# Patient Record
Sex: Male | Born: 1935 | Race: White | Hispanic: No | Marital: Married | Smoking: Former smoker
Health system: Southern US, Community
[De-identification: ages and names within clinical notes are randomized; demographics above are authoritative.]

## PROBLEM LIST (undated history)

## (undated) DIAGNOSIS — L719 Rosacea, unspecified: Secondary | ICD-10-CM

## (undated) DIAGNOSIS — I10 Essential (primary) hypertension: Secondary | ICD-10-CM

## (undated) DIAGNOSIS — R569 Unspecified convulsions: Secondary | ICD-10-CM

## (undated) DIAGNOSIS — Z86010 Personal history of colonic polyps: Secondary | ICD-10-CM

## (undated) DIAGNOSIS — R32 Unspecified urinary incontinence: Secondary | ICD-10-CM

## (undated) DIAGNOSIS — I712 Thoracic aortic aneurysm, without rupture, unspecified: Secondary | ICD-10-CM

## (undated) DIAGNOSIS — Z8719 Personal history of other diseases of the digestive system: Secondary | ICD-10-CM

## (undated) DIAGNOSIS — R55 Syncope and collapse: Secondary | ICD-10-CM

## (undated) DIAGNOSIS — Z8249 Family history of ischemic heart disease and other diseases of the circulatory system: Secondary | ICD-10-CM

## (undated) DIAGNOSIS — K219 Gastro-esophageal reflux disease without esophagitis: Secondary | ICD-10-CM

## (undated) DIAGNOSIS — R112 Nausea with vomiting, unspecified: Secondary | ICD-10-CM

## (undated) DIAGNOSIS — J309 Allergic rhinitis, unspecified: Secondary | ICD-10-CM

## (undated) DIAGNOSIS — H6690 Otitis media, unspecified, unspecified ear: Secondary | ICD-10-CM

## (undated) DIAGNOSIS — E785 Hyperlipidemia, unspecified: Secondary | ICD-10-CM

## (undated) DIAGNOSIS — Z9889 Other specified postprocedural states: Secondary | ICD-10-CM

## (undated) HISTORY — DX: Rosacea, unspecified: L71.9

## (undated) HISTORY — DX: Family history of ischemic heart disease and other diseases of the circulatory system: Z82.49

## (undated) HISTORY — DX: Personal history of other diseases of the digestive system: Z87.19

## (undated) HISTORY — DX: Thoracic aortic aneurysm, without rupture: I71.2

## (undated) HISTORY — DX: Allergic rhinitis, unspecified: J30.9

## (undated) HISTORY — DX: Syncope and collapse: R55

## (undated) HISTORY — DX: Unspecified convulsions: R56.9

## (undated) HISTORY — PX: CATARACT EXTRACTION: SUR2

## (undated) HISTORY — DX: Unspecified urinary incontinence: R32

## (undated) HISTORY — DX: Hyperlipidemia, unspecified: E78.5

## (undated) HISTORY — DX: Essential (primary) hypertension: I10

## (undated) HISTORY — DX: Thoracic aortic aneurysm, without rupture, unspecified: I71.20

## (undated) HISTORY — DX: Personal history of colonic polyps: Z86.010

## (undated) HISTORY — DX: Otitis media, unspecified, unspecified ear: H66.90

## (undated) HISTORY — DX: Gastro-esophageal reflux disease without esophagitis: K21.9

---
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
## 1999-03-18 ENCOUNTER — Ambulatory Visit (HOSPITAL_COMMUNITY): Admission: RE | Admit: 1999-03-18 | Discharge: 1999-03-18 | Payer: Self-pay | Admitting: Gastroenterology

## 1999-05-10 HISTORY — PX: OTHER SURGICAL HISTORY: SHX169

## 2003-05-10 HISTORY — PX: OTHER SURGICAL HISTORY: SHX169

## 2003-05-10 LAB — HM COLONOSCOPY: HM Colonoscopy: NORMAL

## 2004-03-30 ENCOUNTER — Encounter: Admission: RE | Admit: 2004-03-30 | Discharge: 2004-03-30 | Payer: Self-pay | Admitting: Gastroenterology

## 2004-04-16 ENCOUNTER — Ambulatory Visit (HOSPITAL_COMMUNITY): Admission: RE | Admit: 2004-04-16 | Discharge: 2004-04-16 | Payer: Self-pay | Admitting: Gastroenterology

## 2004-04-16 ENCOUNTER — Encounter (INDEPENDENT_AMBULATORY_CARE_PROVIDER_SITE_OTHER): Payer: Self-pay | Admitting: *Deleted

## 2005-01-21 ENCOUNTER — Encounter: Admission: RE | Admit: 2005-01-21 | Discharge: 2005-01-31 | Payer: Self-pay | Admitting: Family Medicine

## 2006-05-09 HISTORY — PX: OTHER SURGICAL HISTORY: SHX169

## 2009-02-23 ENCOUNTER — Ambulatory Visit: Payer: Self-pay | Admitting: Internal Medicine

## 2009-02-23 DIAGNOSIS — R5383 Other fatigue: Secondary | ICD-10-CM

## 2009-02-23 DIAGNOSIS — Z8719 Personal history of other diseases of the digestive system: Secondary | ICD-10-CM

## 2009-02-23 DIAGNOSIS — I10 Essential (primary) hypertension: Secondary | ICD-10-CM | POA: Insufficient documentation

## 2009-02-23 DIAGNOSIS — Z860100 Personal history of colon polyps, unspecified: Secondary | ICD-10-CM | POA: Insufficient documentation

## 2009-02-23 DIAGNOSIS — R32 Unspecified urinary incontinence: Secondary | ICD-10-CM | POA: Insufficient documentation

## 2009-02-23 DIAGNOSIS — J309 Allergic rhinitis, unspecified: Secondary | ICD-10-CM

## 2009-02-23 DIAGNOSIS — K219 Gastro-esophageal reflux disease without esophagitis: Secondary | ICD-10-CM

## 2009-02-23 DIAGNOSIS — R5381 Other malaise: Secondary | ICD-10-CM

## 2009-02-23 HISTORY — DX: Personal history of colonic polyps: Z86.010

## 2009-02-23 HISTORY — DX: Gastro-esophageal reflux disease without esophagitis: K21.9

## 2009-02-23 HISTORY — DX: Allergic rhinitis, unspecified: J30.9

## 2009-02-23 HISTORY — DX: Essential (primary) hypertension: I10

## 2009-02-23 HISTORY — DX: Personal history of colon polyps, unspecified: Z86.0100

## 2009-02-23 HISTORY — DX: Unspecified urinary incontinence: R32

## 2009-02-23 HISTORY — DX: Personal history of other diseases of the digestive system: Z87.19

## 2009-02-23 LAB — CONVERTED CEMR LAB
ALT: 24 units/L (ref 0–53)
AST: 26 units/L (ref 0–37)
Albumin: 3.7 g/dL (ref 3.5–5.2)
Alkaline Phosphatase: 69 units/L (ref 39–117)
BUN: 16 mg/dL (ref 6–23)
Basophils Absolute: 0 10*3/uL (ref 0.0–0.1)
Basophils Relative: 0.6 % (ref 0.0–3.0)
Bilirubin Urine: NEGATIVE
Bilirubin, Direct: 0.1 mg/dL (ref 0.0–0.3)
CO2: 29 meq/L (ref 19–32)
Calcium: 8.4 mg/dL (ref 8.4–10.5)
Chloride: 99 meq/L (ref 96–112)
Cholesterol: 187 mg/dL (ref 0–200)
Creatinine, Ser: 1.2 mg/dL (ref 0.4–1.5)
Eosinophils Absolute: 0 10*3/uL (ref 0.0–0.7)
Eosinophils Relative: 0.7 % (ref 0.0–5.0)
GFR calc non Af Amer: 62.21 mL/min (ref >60)
Glucose, Bld: 95 mg/dL (ref 70–99)
HCT: 41.9 % (ref 39.0–52.0)
HDL: 42.1 mg/dL (ref >39.00)
Hemoglobin, Urine: NEGATIVE
Hemoglobin: 14.2 g/dL (ref 13.0–17.0)
Ketones, ur: NEGATIVE mg/dL
LDL Cholesterol: 120 mg/dL — ABNORMAL HIGH (ref 0–99)
Leukocytes, UA: NEGATIVE
Lymphocytes Relative: 32.7 % (ref 12.0–46.0)
Lymphs Abs: 1.7 10*3/uL (ref 0.7–4.0)
MCHC: 33.9 g/dL (ref 30.0–36.0)
MCV: 96.9 fL (ref 78.0–100.0)
Monocytes Absolute: 0.3 10*3/uL (ref 0.1–1.0)
Monocytes Relative: 6 % (ref 3.0–12.0)
Neutro Abs: 3.1 10*3/uL (ref 1.4–7.7)
Neutrophils Relative %: 60 % (ref 43.0–77.0)
Nitrite: NEGATIVE
Platelets: 230 10*3/uL (ref 150.0–400.0)
Potassium: 5 meq/L (ref 3.5–5.1)
RBC: 4.32 M/uL (ref 4.22–5.81)
RDW: 12.4 % (ref 11.5–14.6)
Sodium: 136 meq/L (ref 135–145)
Specific Gravity, Urine: <=1.005 (ref 1.000–1.030)
TSH: 1.64 microintl units/mL (ref 0.35–5.50)
Total Bilirubin: 0.8 mg/dL (ref 0.3–1.2)
Total CHOL/HDL Ratio: 4
Total Protein, Urine: NEGATIVE mg/dL
Total Protein: 7.8 g/dL (ref 6.0–8.3)
Triglycerides: 123 mg/dL (ref 0.0–149.0)
Urine Glucose: NEGATIVE mg/dL
Urobilinogen, UA: 0.2 (ref 0.0–1.0)
VLDL: 24.6 mg/dL (ref 0.0–40.0)
WBC: 5.1 10*3/uL (ref 4.5–10.5)
pH: 5.5 (ref 5.0–8.0)

## 2009-07-07 ENCOUNTER — Encounter: Payer: Self-pay | Admitting: Internal Medicine

## 2009-08-18 ENCOUNTER — Encounter: Payer: Self-pay | Admitting: Internal Medicine

## 2009-09-08 ENCOUNTER — Ambulatory Visit: Payer: Self-pay | Admitting: Internal Medicine

## 2009-09-09 DIAGNOSIS — L719 Rosacea, unspecified: Secondary | ICD-10-CM

## 2009-09-09 HISTORY — DX: Rosacea, unspecified: L71.9

## 2009-09-16 ENCOUNTER — Encounter: Payer: Self-pay | Admitting: Internal Medicine

## 2009-11-04 ENCOUNTER — Encounter: Payer: Self-pay | Admitting: Internal Medicine

## 2009-12-08 ENCOUNTER — Encounter: Payer: Self-pay | Admitting: Internal Medicine

## 2009-12-23 ENCOUNTER — Encounter: Payer: Self-pay | Admitting: Internal Medicine

## 2009-12-31 ENCOUNTER — Encounter: Payer: Self-pay | Admitting: Internal Medicine

## 2010-01-06 ENCOUNTER — Encounter: Payer: Self-pay | Admitting: Internal Medicine

## 2010-02-25 ENCOUNTER — Ambulatory Visit: Payer: Self-pay | Admitting: Internal Medicine

## 2010-06-08 NOTE — Letter (Signed)
Summary: [facility]   Imported By: Sherian Rein 01/12/2010 10:53:53  _____________________________________________________________________  External Attachment:    Type:   Image     Comment:   External Document

## 2010-06-08 NOTE — Letter (Signed)
Summary: [facility]  [facility]   Imported By: Lennie Odor 09/22/2009 16:32:22  _____________________________________________________________________  External Attachment:    Type:   Image     Comment:   External Document

## 2010-06-08 NOTE — Letter (Signed)
Summary: [facility] [place]   Imported By: Sherian Rein 07/10/2009 07:52:33  _____________________________________________________________________  External Attachment:    Type:   Image     Comment:   External Document

## 2010-06-08 NOTE — Letter (Signed)
Summary: [facility] [place]   Imported By: Sherian Rein 08/25/2009 09:50:12  _____________________________________________________________________  External Attachment:    Type:   Image     Comment:   External Document

## 2010-06-08 NOTE — Assessment & Plan Note (Signed)
Summary: new / ok'd dr Zachary Padilla / medicare  / cd   Primary Care Provider:  Newt Lukes MD   History of Present Illness: Patient presents today for a general physical exam. He has no problem with Dr. Felicity Padilla but he is "old school" and prefers a male to do his exam. He did have to change to Jud due to an insurance change. He had been a patient of Dr. Abigail Padilla.   He is having a problem with a flare of rosacea and rhinophyma.  He has hiatal hernia and significant reflux. He is concerned about the side effects of PPI meds. He is taking TUMS and being careful about his diet.   He has chronic sinus drainage.He has had sinus surgery by Dr. Dorma Padilla. He does have increased mucoid secretions. He does do a sinus wash daily.   Allergies: 1)  ! Sulfa 2)  ! Erythromycin 3)  ! Levaquin 4)  ! Penicillin  Past History:  Past Medical History: Last updated: 02/23/2009 Allergic rhinitis Colonic polyps, hx of Diverticulitis, hx of GERD Hypertension Urinary incontinence hx campylobacter infx (GI)  MD rooster: ENT-[facility] cards- Allyson Sabal [facility] skinJorja Loa  Past Surgical History: Last updated: 02/23/2009 Appendectomy (1946) Sinus surgery (2008) Removal of pre-cancer bump on neck (2005) left ear tube (2001) Ruptured append 1946  Family History: Last updated: 02/23/2009 Heart disease (parent) Stroke (parent)  Risk Factors: Alcohol Use: 0 (02/23/2009)  Risk Factors: Smoking Status: never (02/23/2009)  Social History: [employer]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  Review of Systems       The patient complains of vision loss, decreased  hearing, hoarseness, and headaches.  The patient denies anorexia, fever, weight loss, weight gain, chest pain, dyspnea on exertion, prolonged cough, hemoptysis, abdominal pain, severe indigestion/heartburn, muscle weakness, suspicious skin lesions, difficulty walking, unusual weight change, abnormal bleeding, and angioedema.         known to have cataracts. Opthalmic migraine. Has had vertigo when [place]. Nocturia x 2-3  Physical Exam  General:  alert, well-developed, well-nourished, and well-hydrated Zachary Padilla male in no distress.   Head:  normocephalic, atraumatic, and no abnormalities observed.   Eyes:  vision grossly intact, pupils equal, pupils round, corneas and lenses clear, and no injection.   Ears:  hearing aids in place Nose:  rhinophyma,  Mouth:  good dentition, no gingival abnormalities, and no dental plaque.   Neck:  supple, full ROM, no masses, no thyromegaly, and no carotid bruits.   Chest Wall:  no deformities and no tenderness.   Lungs:  normal respiratory effort, normal breath sounds, no crackles, and no wheezes.   Heart:  normal rate, regular rhythm, no murmur, no JVD, and no HJR.   Abdomen:  soft, non-tender, normal bowel sounds, no masses, no guarding, no abdominal hernia, and no hepatomegaly.   Rectal:  No external abnormalities noted. Normal sphincter tone. No rectal masses or tenderness. Genitalia:  circumcised, no hydrocele, no varicocele, and no scrotal masses.   Prostate:  no gland enlargement, no nodules, and no asymmetry.   Msk:  normal ROM, no joint tenderness, no joint swelling, no joint warmth, no joint instability, and no  crepitation.   Pulses:  2+ radial pulses or 1+ DP Extremities:  No clubbing, cyanosis, edema, or deformity noted with normal full range of motion of all joints.   Neurologic:  alert & oriented X3, cranial nerves II-XII intact, strength normal in all extremities, gait normal, and DTRs symmetrical and normal.   Skin:  turgor normal  and color normal.  wide spread seborrheic keratosis.   Impression & Recommendations:  Problem # 1:  HYPERTENSION (ICD-401.9)  His updated medication list for this problem includes:    Cozaar 50 Mg Tabs (Losartan potassium) .Marland Kitchen... Take 1 by mouth qd    Doxazosin Mesylate 1 Mg Tabs (Doxazosin mesylate) .Marland Kitchen... Take 1/2 by mouth qhs  Prior BP: 110/60 (02/23/2009)  Labs Reviewed: K+: 5.0 (02/23/2009) Creat: : 1.2 (02/23/2009)   Chol: 187 (02/23/2009)   HDL: 42.10 (02/23/2009)   LDL: 120 (02/23/2009)   TG: 123.0 (02/23/2009)  Good control on his present medications - will continue the same  Problem # 2:  GERD (ICD-530.81) Patient with long-standing GERD. He is concerned about long term use of PPI therapy and has tried to wean off the omeprazole.  Plan - Consider the use of H2 blocker therapy,e.g. ranitidine 150 two times a day.           Keep follow-up with Dr. Christella Hartigan  His updated medication list for this problem includes:    Nexium 40 Mg Cpdr (Esomeprazole magnesium) ..... Use prn    Omeprazole 20 Mg Cpdr (Omeprazole) .Marland Kitchen... 1 by mouth once daily or every other day for heartburn symptoms  Problem # 3:  ALLERGIC RHINITIS (ICD-477.9) He has chronic allergic rhinnitis and inspissated mucoid secretions. He sees Dr. Dorma Padilla on a regular basis for irrigation.  Plan - continue with Dr. Dorma Padilla           May want to consider the use of nasal inhalational steroid spray for long term management of rhinnitis.  Problem # 4:  ACNE ROSACEA (ICD-695.3) Patient with rhinophyma and currently a flare of rosacea.   Plan - continue with topical treatment.   Problem # 5:  End of Life Care Started a discussion about issues of cardiac resuscitation, mechanical ventilation and other heroic measures. He is provided with a packet on living wills/ end of life planning.   Complete Medication List: 1)  Cozaar 50 Mg Tabs (Losartan potassium) .... Take 1 by mouth qd 2)  Nexium 40 Mg Cpdr (Esomeprazole magnesium)  .... Use prn 3)  Finacea 15 % Gel (Azelaic acid) .... Use daily 4)  Omeprazole 20 Mg Cpdr (Omeprazole) .Marland Kitchen.. 1 by mouth once daily or every other day for heartburn symptoms 5)  Doxazosin Mesylate 1 Mg Tabs (Doxazosin mesylate) .... Take 1/2 by mouth qhs  Patient: Zachary Padilla Note: All result statuses are Final unless otherwise noted.  Tests: (1) BMP (METABOL)   Sodium                    136 mEq/L                   135-145   Potassium                 5.0 mEq/L                   3.5-5.1   Chloride                  99 mEq/L  96-112   Carbon Dioxide            29 mEq/L                    19-32   Glucose                   95 mg/dL                    19-62   BUN                       16 mg/dL                    2-29   Creatinine                1.2 mg/dL                   7.9-8.9   Calcium                   8.4 mg/dL                   2.1-19.4   GFR                       62.21 mL/min                >60  Tests: (2) CBC Platelet w/Diff (CBCD)   Lukas Cell Count          5.1 K/uL                    4.5-10.5   Red Cell Count            4.32 Mil/uL                 4.22-5.81   Hemoglobin                14.2 g/dL                   17.4-08.1   Hematocrit                41.9 %                      39.0-52.0   MCV                       96.9 fl                     78.0-100.0   MCHC                      33.9 g/dL                   44.8-18.5   RDW                       12.4 %                      11.5-14.6   Platelet Count            230.0 K/uL                  150.0-400.0   Neutrophil %  60.0 %                      43.0-77.0   Lymphocyte %              32.7 %                      12.0-46.0   Monocyte %                6.0 %                       3.0-12.0   Eosinophils%              0.7 %                       0.0-5.0   Basophils %               0.6 %                       0.0-3.0   Neutrophill Absolute      3.1 K/uL                    1.4-7.7   Lymphocyte Absolute        1.7 K/uL                    0.7-4.0   Monocyte Absolute         0.3 K/uL                    0.1-1.0  Eosinophils, Absolute                             0.0 K/uL                    0.0-0.7   Basophils Absolute        0.0 K/uL                    0.0-0.1  Tests: (3) Hepatic/Liver Function Panel (HEPATIC)   Total Bilirubin           0.8 mg/dL                   0.4-5.4   Direct Bilirubin          0.1 mg/dL                   0.9-8.1   Alkaline Phosphatase      69 U/L                      39-117   AST                       26 U/L                      0-37   ALT                       24 U/L                      0-53   Total Protein  7.8 g/dL                    1.6-1.0   Albumin                   3.7 g/dL                    9.6-0.4  Tests: (4) TSH (TSH)   FastTSH                   1.64 uIU/mL                 0.35-5.50  Tests: (5) UDip w/Micro (URINE)   Color                     LT. YELLOW       RANGE:  Yellow;Lt. Yellow   Clarity                   CLEAR                       Clear   Specific Gravity          <=1.005                     1.000 - 1.030   Urine Ph                  5.5                         5.0-8.0   Protein                   NEGATIVE                    Negative   Urine Glucose             NEGATIVE                    Negative   Ketones                   NEGATIVE                    Negative   Urine Bilirubin           NEGATIVE                    Negative   Blood                     NEGATIVE                    Negative   Urobilinogen              0.2                         0.0 - 1.0   Leukocyte Esterace        NEGATIVE                    Negative   Nitrite                   NEGATIVE                    Negative  Urine WBC                 0-2/hpf                     0-2/hpf   Urine Bacteria            Rare(<10/hpf)               None  Tests: (6) Lipid Panel (LIPID)   Cholesterol               187 mg/dL                   9-562     ATP III Classification             Desirable:  < 200 mg/dL                    Borderline High:  200 - 239 mg/dL               High:  > = 240 mg/dL   Triglycerides             123.0 mg/dL                 1.3-086.5     Normal:  <150 mg/dL     Borderline High:  784 - 199 mg/dL   HDL                       69.62 mg/dL                 >95.28   VLDL Cholesterol          24.6 mg/dL                  4.1-32.4   LDL Cholesterol      [H]  401 mg/dL                   0-27  CHO/HDL Ratio:  CHD Risk                             4                    Men          Women     1/2 Average Risk     3.4          3.3     Average Risk          5.0          4.4     2X Average Risk          9.6          7.1     3X Average Risk          15.0          11.0

## 2010-06-08 NOTE — Assessment & Plan Note (Signed)
Summary: FLU VAC  TLJ  STC  Nurse Visit   Allergies: 1)  ! Sulfa 2)  ! Erythromycin 3)  ! Levaquin 4)  ! Penicillin  Orders Added: 1)  Admin 1st Vaccine [90471] 2)  Flu Vaccine 66yrs + [25956] .lbflu1   Flu Vaccine Consent Questions     Do you have a history of severe allergic reactions to this vaccine? no    Any prior history of allergic reactions to egg and/or gelatin? no    Do you have a sensitivity to the preservative Thimersol? no    Do you have a past history of Guillan-Barre Syndrome? no    Do you currently have an acute febrile illness? no    Have you ever had a severe reaction to latex? no    Vaccine information given and explained to patient? yes    Are you currently pregnant? no    Lot Number:[id number]   Exp Date:11/06/2010   Site Given  Left Deltoid IM Lanier Prude, [facility])  February 25, 2010 3:21 PM

## 2010-06-08 NOTE — Letter (Signed)
Summary: [facility]  [facility]   Imported By: Lennie Odor 12/15/2009 12:10:32  _____________________________________________________________________  External Attachment:    Type:   Image     Comment:   External Document

## 2010-06-08 NOTE — Letter (Signed)
Summary: [facility]  [facility]   Imported By: Lester Dibble 01/26/2010 07:25:40  _____________________________________________________________________  External Attachment:    Type:   Image     Comment:   External Document

## 2010-06-08 NOTE — Letter (Signed)
Summary: [facility]   Imported By: Lester [place] 11/10/2009 09:16:19  _____________________________________________________________________  External Attachment:    Type:   Image     Comment:   External Document

## 2010-06-15 ENCOUNTER — Encounter: Payer: Self-pay | Admitting: Internal Medicine

## 2010-06-23 ENCOUNTER — Other Ambulatory Visit: Payer: Self-pay | Admitting: Dermatology

## 2010-06-23 DIAGNOSIS — C4491 Basal cell carcinoma of skin, unspecified: Secondary | ICD-10-CM

## 2010-06-23 HISTORY — DX: Basal cell carcinoma of skin, unspecified: C44.91

## 2010-07-05 ENCOUNTER — Other Ambulatory Visit: Payer: Self-pay | Admitting: Dermatology

## 2010-07-15 NOTE — Letter (Signed)
Summary: Runell Gess MD  Runell Gess MD   Imported By: Lester [place]itte 07/05/2010 08:36:12  _____________________________________________________________________  External Attachment:    Type:   Image     Comment:   External Document

## 2010-10-27 ENCOUNTER — Ambulatory Visit (INDEPENDENT_AMBULATORY_CARE_PROVIDER_SITE_OTHER): Payer: 59 | Admitting: Internal Medicine

## 2010-10-27 ENCOUNTER — Encounter: Payer: Self-pay | Admitting: Internal Medicine

## 2010-10-27 ENCOUNTER — Other Ambulatory Visit (INDEPENDENT_AMBULATORY_CARE_PROVIDER_SITE_OTHER): Payer: 59

## 2010-10-27 DIAGNOSIS — K219 Gastro-esophageal reflux disease without esophagitis: Secondary | ICD-10-CM

## 2010-10-27 DIAGNOSIS — M25512 Pain in left shoulder: Secondary | ICD-10-CM

## 2010-10-27 DIAGNOSIS — M25519 Pain in unspecified shoulder: Secondary | ICD-10-CM

## 2010-10-27 DIAGNOSIS — R1011 Right upper quadrant pain: Secondary | ICD-10-CM

## 2010-10-27 LAB — HEPATIC FUNCTION PANEL
ALT: 14 U/L (ref 0–53)
AST: 19 U/L (ref 0–37)
Alkaline Phosphatase: 81 U/L (ref 39–117)
Bilirubin, Direct: 0.1 mg/dL (ref 0.0–0.3)
Total Bilirubin: 0.7 mg/dL (ref 0.3–1.2)
Total Protein: 7.1 g/dL (ref 6.0–8.3)

## 2010-10-27 LAB — BASIC METABOLIC PANEL
BUN: 23 mg/dL (ref 6–23)
CO2: 30 mEq/L (ref 19–32)
Calcium: 8.6 mg/dL (ref 8.4–10.5)
Creatinine, Ser: 1.2 mg/dL (ref 0.4–1.5)
Glucose, Bld: 98 mg/dL (ref 70–99)
Sodium: 135 mEq/L (ref 135–145)

## 2010-10-27 LAB — CBC WITH DIFFERENTIAL/PLATELET
Basophils Absolute: 0 10*3/uL (ref 0.0–0.1)
Basophils Relative: 0.4 % (ref 0.0–3.0)
Eosinophils Absolute: 0.1 10*3/uL (ref 0.0–0.7)
HCT: 41.5 % (ref 39.0–52.0)
Hemoglobin: 14.6 g/dL (ref 13.0–17.0)
Lymphocytes Relative: 32.5 % (ref 12.0–46.0)
Lymphs Abs: 2.2 10*3/uL (ref 0.7–4.0)
MCV: 95.6 fl (ref 78.0–100.0)
Monocytes Relative: 6.7 % (ref 3.0–12.0)
Platelets: 182 10*3/uL (ref 150.0–400.0)
RBC: 4.34 Mil/uL (ref 4.22–5.81)
RDW: 12.8 % (ref 11.5–14.6)
WBC: 6.9 10*3/uL (ref 4.5–10.5)

## 2010-10-27 LAB — LIPASE: Lipase: 36 U/L (ref 11.0–59.0)

## 2010-10-27 NOTE — Progress Notes (Signed)
  Subjective:    Patient ID: Zachary Padilla, male    DOB: 1928-06-20, 77 y.o.   MRN: [id number]  HPI  Complains muscle spasm in left shoulder blade region Precipitated by reflux 4 days ago - 1st episode since stopping PPI summer 2011 Lasted few hours until resolution with TUMS Then awoke from sleep with pain under left shoulder pain -  Pain resolved with hot compress massage No recurrent pain in shoulder since or recurrent GERD No nausea and vomiting but concerned about GB problems Admits to overuse working in neighbors garden day prior to shoulder pain  Past Medical History  Diagnosis Date  . 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 ALLERGIC RHINITIS 02/23/2009  . COLONIC POLYPS, HX OF 02/23/2009  . DIVERTICULITIS, HX OF 02/23/2009  . GERD 02/23/2009  . HYPERTENSION 02/23/2009  . URINARY INCONTINENCE 02/23/2009    Review of Systems  Constitutional: Negative for fever.  Respiratory: Negative for shortness of breath.   Cardiovascular: Negative for leg swelling.  Gastrointestinal: Negative for nausea, vomiting, abdominal pain, diarrhea and constipation.       Objective:   Physical Exam BP 112/70  Pulse 72  Temp(Src) 97.7 F (36.5 C) (Oral)  Ht 5\' 10"  (1.778 m)  Wt 163 lb 6.4 oz (74.118 kg)  BMI 23.45 kg/m2  SpO2 96% Wt Readings from Last 3 Encounters:  10/27/10 163 lb 6.4 oz (74.118 kg)  02/23/09 166 lb 3.2 oz (75.388 kg)   Physical Exam  Constitutional:  oriented to person, place, and time. appears well-developed and well-nourished. No distress.  Neck: Normal range of motion. Neck supple. No JVD present. No thyromegaly present.  Cardiovascular: Normal rate, regular rhythm and normal heart sounds.  No murmur heard. no BLE edema Pulmonary/Chest: Effort normal and breath sounds normal. No respiratory distress. no wheezes.  Abdominal: Soft. Bowel sounds are normal. Patient exhibits no distension. There is no tenderness.  Musculoskeletal: Normal range of motion B shoulders and neck.  Patient exhibits no edema. no myofascial spasm B scapular region or neck. Neurological: he is alert and oriented to person, place, and time. No cranial nerve deficit. Coordination normal.  Skin: Skin is warm and dry.  No erythema or ulceration.  no shingles rash Psychiatric: he has a normal mood and affect. behavior is normal. Judgment and thought content normal.       Lab Results  Component Value Date   WBC 5.1 02/23/2009   HGB 14.2 02/23/2009   HCT 41.9 02/23/2009   PLT 230.0 02/23/2009   CHOL 187 02/23/2009   TRIG 123.0 02/23/2009   HDL 42.10 02/23/2009   ALT 24 02/23/2009   AST 26 02/23/2009   NA 136 02/23/2009   K 5.0 02/23/2009   CL 99 02/23/2009   CREATININE 1.2 02/23/2009   BUN 16 02/23/2009   CO2 29 02/23/2009   TSH 1.64 02/23/2009    Assessment & Plan:  GERD - transient, no symptoms at present - previously on PPI for same but self dc'd since summer 2011 - given transient pain radiation to shoulder blade, pt would like to be checked for GB labs - will check CBC and LFTs as well as lipase now - no change in meds as no symptoms present - cont prn TUMS if recurrent

## 2010-10-27 NOTE — Patient Instructions (Signed)
It was good to see you today. Exam appears benign today Test(s) ordered today. Your results will be called to you after review (48-72hours after test completion). If any changes need to be made, you will be notified at that time. Continue tums as needed, call if recurrent pain symptoms

## 2010-11-07 ENCOUNTER — Encounter: Payer: Self-pay | Admitting: Internal Medicine

## 2011-03-01 ENCOUNTER — Ambulatory Visit (INDEPENDENT_AMBULATORY_CARE_PROVIDER_SITE_OTHER): Payer: 59

## 2011-03-01 DIAGNOSIS — Z23 Encounter for immunization: Secondary | ICD-10-CM

## 2011-03-14 ENCOUNTER — Encounter (HOSPITAL_COMMUNITY): Payer: Self-pay

## 2011-03-16 ENCOUNTER — Other Ambulatory Visit: Payer: Self-pay | Admitting: Gastroenterology

## 2011-03-16 DIAGNOSIS — K5730 Diverticulosis of large intestine without perforation or abscess without bleeding: Secondary | ICD-10-CM

## 2011-03-17 ENCOUNTER — Ambulatory Visit (HOSPITAL_COMMUNITY): Admission: RE | Admit: 2011-03-17 | Payer: 59 | Source: Ambulatory Visit | Admitting: Gastroenterology

## 2011-03-17 ENCOUNTER — Encounter (HOSPITAL_COMMUNITY): Admission: RE | Payer: Self-pay | Source: Ambulatory Visit

## 2011-03-17 HISTORY — DX: Other specified postprocedural states: R11.2

## 2011-03-17 HISTORY — DX: Other specified postprocedural states: Z98.890

## 2011-03-17 SURGERY — COLONOSCOPY
Anesthesia: Monitor Anesthesia Care

## 2011-03-23 ENCOUNTER — Other Ambulatory Visit: Payer: 59

## 2011-03-29 ENCOUNTER — Ambulatory Visit
Admission: RE | Admit: 2011-03-29 | Discharge: 2011-03-29 | Disposition: A | Discharge status: Home / Self Care | Payer: 59 | Source: Ambulatory Visit | Attending: Gastroenterology | Admitting: Gastroenterology

## 2011-03-29 DIAGNOSIS — K5730 Diverticulosis of large intestine without perforation or abscess without bleeding: Secondary | ICD-10-CM

## 2011-03-30 ENCOUNTER — Other Ambulatory Visit: Payer: 59

## 2011-10-07 ENCOUNTER — Other Ambulatory Visit (INDEPENDENT_AMBULATORY_CARE_PROVIDER_SITE_OTHER): Payer: 59

## 2011-10-07 ENCOUNTER — Telehealth: Payer: Self-pay | Admitting: Internal Medicine

## 2011-10-07 DIAGNOSIS — R300 Dysuria: Secondary | ICD-10-CM

## 2011-10-07 DIAGNOSIS — R109 Unspecified abdominal pain: Secondary | ICD-10-CM

## 2011-10-07 LAB — URINALYSIS, ROUTINE W REFLEX MICROSCOPIC
Bilirubin Urine: NEGATIVE
Hgb urine dipstick: NEGATIVE
Nitrite: NEGATIVE
Specific Gravity, Urine: 1.02 (ref 1.000–1.030)
Total Protein, Urine: NEGATIVE
Urine Glucose: NEGATIVE
Urobilinogen, UA: 0.2 (ref 0.0–1.0)
pH: 6 (ref 5.0–8.0)

## 2011-10-07 NOTE — Telephone Encounter (Signed)
PT IS AWARE AND WILL GO TO THE LAB WITHIN THE HOUR.  PLEASE CALL HIM WHEN RESULTS COME IN.

## 2011-10-07 NOTE — Telephone Encounter (Signed)
Unable to work in - but pt can come in for UA (t lab) to check for UTI -

## 2011-10-07 NOTE — Telephone Encounter (Signed)
UA entered in epic... 10/07/11@1 :30pm/LMB

## 2011-10-07 NOTE — Telephone Encounter (Signed)
Pt may have a bladder infection.  Pain in left lower abd.  Would like to be worked in.

## 2011-10-10 ENCOUNTER — Telehealth: Payer: Self-pay | Admitting: *Deleted

## 2011-10-10 NOTE — Telephone Encounter (Signed)
Patient notified of negative UA results. To call if any more problems

## 2012-01-02 ENCOUNTER — Encounter: Payer: Self-pay | Admitting: Internal Medicine

## 2012-01-02 ENCOUNTER — Ambulatory Visit (INDEPENDENT_AMBULATORY_CARE_PROVIDER_SITE_OTHER): Payer: 59 | Admitting: Internal Medicine

## 2012-01-02 ENCOUNTER — Other Ambulatory Visit (INDEPENDENT_AMBULATORY_CARE_PROVIDER_SITE_OTHER): Payer: 59

## 2012-01-02 ENCOUNTER — Ambulatory Visit: Payer: 59 | Admitting: Internal Medicine

## 2012-01-02 ENCOUNTER — Telehealth: Payer: Self-pay | Admitting: Internal Medicine

## 2012-01-02 ENCOUNTER — Ambulatory Visit (INDEPENDENT_AMBULATORY_CARE_PROVIDER_SITE_OTHER)
Admission: RE | Admit: 2012-01-02 | Discharge: 2012-01-02 | Disposition: A | Discharge status: Home / Self Care | Payer: 59 | Source: Ambulatory Visit | Attending: Internal Medicine | Admitting: Internal Medicine

## 2012-01-02 VITALS — BP 126/70 | HR 71 | Temp 98.5°F | Resp 16 | Wt 168.0 lb

## 2012-01-02 DIAGNOSIS — I10 Essential (primary) hypertension: Secondary | ICD-10-CM

## 2012-01-02 DIAGNOSIS — R079 Chest pain, unspecified: Secondary | ICD-10-CM

## 2012-01-02 DIAGNOSIS — R059 Cough, unspecified: Secondary | ICD-10-CM

## 2012-01-02 DIAGNOSIS — R05 Cough: Secondary | ICD-10-CM

## 2012-01-02 DIAGNOSIS — R9431 Abnormal electrocardiogram [ECG] [EKG]: Secondary | ICD-10-CM

## 2012-01-02 LAB — CBC WITH DIFFERENTIAL/PLATELET
Basophils Absolute: 0.1 10*3/uL (ref 0.0–0.1)
Eosinophils Absolute: 0.1 10*3/uL (ref 0.0–0.7)
Hemoglobin: 14.2 g/dL (ref 13.0–17.0)
Lymphocytes Relative: 22.2 % (ref 12.0–46.0)
Lymphs Abs: 1.7 10*3/uL (ref 0.7–4.0)
MCHC: 32.8 g/dL (ref 30.0–36.0)
MCV: 97.6 fl (ref 78.0–100.0)
Monocytes Absolute: 0.5 10*3/uL (ref 0.1–1.0)
Monocytes Relative: 6.3 % (ref 3.0–12.0)
Neutro Abs: 5.4 10*3/uL (ref 1.4–7.7)
Neutrophils Relative %: 69.3 % (ref 43.0–77.0)
Platelets: 174 10*3/uL (ref 150.0–400.0)
RBC: 4.44 Mil/uL (ref 4.22–5.81)
RDW: 12.7 % (ref 11.5–14.6)

## 2012-01-02 LAB — TROPONIN I: Troponin I: <0.01 ng/mL (ref <0.06)

## 2012-01-02 NOTE — Assessment & Plan Note (Signed)
CXR today to look for pna, mass, edema 

## 2012-01-02 NOTE — Assessment & Plan Note (Signed)
His EKG today shows old changes in the inferior leads with Q waves and low voltage. There are no old EKG's for comparison. I have asked him to f/up with Dr.Berry. I will check his labs today to look for ischemia and other issues like abnormal lyytes that can cause an abnormal EKG.

## 2012-01-02 NOTE — Progress Notes (Signed)
Subjective:    Patient ID: Zachary Padilla, male    DOB: July 23, 1928, 78 y.o.   MRN: [id number]  Cough This is a new problem. The current episode started in the past 7 days. The problem has been unchanged. The cough is non-productive. Associated symptoms include chest pain, nasal congestion, postnasal drip and rhinorrhea. Pertinent negatives include no chills, ear congestion, ear pain, fever, headaches, heartburn, hemoptysis, myalgias, rash, sore throat, shortness of breath, sweats, weight loss or wheezing. Nothing aggravates the symptoms. He has tried nothing for the symptoms.  Chest Pain  This is a new problem. The current episode started in the past 7 days. The onset quality is gradual. The problem occurs intermittently. The problem has been unchanged. The pain is present in the lateral region (left lower anterior rib cage). The pain is at a severity of 1/10. The pain is mild. Quality: "pulling and slipping" The pain does not radiate. Associated symptoms include a cough. Pertinent negatives include no abdominal pain, back pain, claudication, diaphoresis, dizziness, exertional chest pressure, fever, headaches, hemoptysis, irregular heartbeat, leg pain, lower extremity edema, malaise/fatigue, nausea, near-syncope, numbness, orthopnea, palpitations, PND, shortness of breath, sputum production, syncope, vomiting or weakness. He has tried nothing for the symptoms.  Pertinent negatives for past medical history include no seizures.      Review of Systems  Constitutional: Negative for fever, chills, weight loss, malaise/fatigue, diaphoresis, activity change, appetite change, fatigue and unexpected weight change.  HENT: Positive for rhinorrhea and postnasal drip. Negative for ear pain, sore throat, trouble swallowing, dental problem, voice change and sinus pressure.   Eyes: Negative.   Respiratory: Positive for cough. Negative for apnea, hemoptysis, sputum production, choking, chest tightness, shortness of  breath, wheezing and stridor.   Cardiovascular: Positive for chest pain. Negative for palpitations, orthopnea, claudication, leg swelling, syncope, PND and near-syncope.  Gastrointestinal: Negative for heartburn, nausea, vomiting, abdominal pain, diarrhea, constipation and blood in stool.  Genitourinary: Negative.   Musculoskeletal: Negative for myalgias, back pain, joint swelling, arthralgias and gait problem.  Skin: Negative for color change, pallor, rash and wound.  Neurological: Negative for dizziness, tremors, seizures, syncope, facial asymmetry, speech difficulty, weakness, light-headedness, numbness and headaches.  Hematological: Negative for adenopathy. Does not bruise/bleed easily.  Psychiatric/Behavioral: Negative.        Objective:   Physical Exam  Vitals reviewed. Constitutional: He is oriented to person, place, and time. He appears well-developed and well-nourished.  Non-toxic appearance. He does not have a sickly appearance. He does not appear ill. No distress.  HENT:  Head: Normocephalic and atraumatic.  Mouth/Throat: Oropharynx is clear and moist. No oropharyngeal exudate.  Eyes: Conjunctivae are normal. Right eye exhibits no discharge. Left eye exhibits no discharge. No scleral icterus.  Neck: Normal range of motion. Neck supple. No JVD present. No tracheal deviation present. No thyromegaly present.  Cardiovascular: Normal rate, regular rhythm, normal heart sounds and intact distal pulses.  Exam reveals no gallop and no friction rub.   No murmur heard. Pulmonary/Chest: Effort normal and breath sounds normal. No stridor. No respiratory distress. He has no wheezes. He has no rales. Chest wall is not dull to percussion. He exhibits no mass, no tenderness, no bony tenderness, no laceration, no crepitus, no edema, no deformity, no swelling and no retraction. Right breast exhibits no inverted nipple, no mass, no nipple discharge, no skin change and no tenderness. Left breast  exhibits no inverted nipple, no mass, no nipple discharge, no skin change and no tenderness. Breasts are symmetrical.  Abdominal:  Soft. Bowel sounds are normal. He exhibits no distension and no mass. There is no tenderness. There is no rebound and no guarding.  Musculoskeletal: Normal range of motion. He exhibits no edema and no tenderness.  Lymphadenopathy:    He has no cervical adenopathy.  Neurological: He is oriented to person, place, and time.  Skin: Skin is warm and dry. No rash noted. He is not diaphoretic. No erythema. No pallor.  Psychiatric: He has a normal mood and affect. His behavior is normal. Judgment and thought content normal.      Lab Results  Component Value Date   WBC 6.9 10/27/2010   HGB 14.6 10/27/2010   HCT 41.5 10/27/2010   PLT 182.0 10/27/2010   GLUCOSE 98 10/27/2010   CHOL 187 02/23/2009   TRIG 123.0 02/23/2009   HDL 42.10 02/23/2009   LDLCALC 120* 02/23/2009   ALT 14 10/27/2010   AST 19 10/27/2010   NA 135 10/27/2010   K 4.9 10/27/2010   CL 100 10/27/2010   CREATININE 1.2 10/27/2010   BUN 23 10/27/2010   CO2 30 10/27/2010   TSH 1.64 02/23/2009      Assessment & Plan:

## 2012-01-02 NOTE — Telephone Encounter (Signed)
Pt wanted to be seen earlier in the day so he requested to be seen by another doctor....scheduled with Dr. Yetta Barre @ 3:30

## 2012-01-02 NOTE — Telephone Encounter (Signed)
Problem in left rib cage.  Feels like something is slipping.  Wants to be worked in.  No pain. Going on for 3 days.

## 2012-01-02 NOTE — Telephone Encounter (Signed)
Late, late today or tomorrow

## 2012-01-02 NOTE — Patient Instructions (Signed)
Chest Pain (Nonspecific) It is often hard to give a specific diagnosis for the cause of chest pain. There is always a chance that your pain could be related to something serious, such as a heart attack or a blood clot in the lungs. You need to follow up with your caregiver for further evaluation. CAUSES   Heartburn.   Pneumonia or bronchitis.   Anxiety or stress.   Inflammation around your heart (pericarditis) or lung (pleuritis or pleurisy).   A blood clot in the lung.   A collapsed lung (pneumothorax). It can develop suddenly on its own (spontaneous pneumothorax) or from injury (trauma) to the chest.   Shingles infection (herpes zoster virus).  The chest wall is composed of bones, muscles, and cartilage. Any of these can be the source of the pain.  The bones can be bruised by injury.   The muscles or cartilage can be strained by coughing or overwork.   The cartilage can be affected by inflammation and become sore (costochondritis).  DIAGNOSIS  Lab tests or other studies, such as X-rays, electrocardiography, stress testing, or cardiac imaging, may be needed to find the cause of your pain.  TREATMENT   Treatment depends on what may be causing your chest pain. Treatment may include:   Acid blockers for heartburn.   Anti-inflammatory medicine.   Pain medicine for inflammatory conditions.   Antibiotics if an infection is present.   You may be advised to change lifestyle habits. This includes stopping smoking and avoiding alcohol, caffeine, and chocolate.   You may be advised to keep your head raised (elevated) when sleeping. This reduces the chance of acid going backward from your stomach into your esophagus.   Most of the time, nonspecific chest pain will improve within 2 to 3 days with rest and mild pain medicine.  HOME CARE INSTRUCTIONS   If antibiotics were prescribed, take your antibiotics as directed. Finish them even if you start to feel better.   For the next few  days, avoid physical activities that bring on chest pain. Continue physical activities as directed.   Do not smoke.   Avoid drinking alcohol.   Only take over-the-counter or prescription medicine for pain, discomfort, or fever as directed by your caregiver.   Follow your caregiver's suggestions for further testing if your chest pain does not go away.   Keep any follow-up appointments you made. If you do not go to an appointment, you could develop lasting (chronic) problems with pain. If there is any problem keeping an appointment, you must call to reschedule.  SEEK MEDICAL CARE IF:   You think you are having problems from the medicine you are taking. Read your medicine instructions carefully.   Your chest pain does not go away, even after treatment.   You develop a rash with blisters on your chest.  SEEK IMMEDIATE MEDICAL CARE IF:   You have increased chest pain or pain that spreads to your arm, neck, jaw, back, or abdomen.   You develop shortness of breath, an increasing cough, or you are coughing up blood.   You have severe back or abdominal pain, feel nauseous, or vomit.   You develop severe weakness, fainting, or chills.   You have a fever.  THIS IS AN EMERGENCY. Do not wait to see if the pain will go away. Get medical help at once. Call your local emergency services (911 in [place] Do not drive yourself to the hospital. MAKE SURE YOU:   Understand these instructions.     Will watch your condition.   Will get help right away if you are not doing well or get worse.  Document Released: 02/02/2005 Document Revised: 04/14/2011 Document Reviewed: 11/29/2007 [employer] Patient Information 2012 [employer]. 

## 2012-01-02 NOTE — Assessment & Plan Note (Signed)
His BP is well controlled, I will check his lytes and renal function 

## 2012-01-02 NOTE — Assessment & Plan Note (Signed)
I will check his CXR to look for structural lesions and have ordered labs to look for chf and ischemia

## 2012-01-03 ENCOUNTER — Telehealth: Payer: Self-pay | Admitting: Internal Medicine

## 2012-01-03 LAB — COMPREHENSIVE METABOLIC PANEL
ALT: 19 U/L (ref 0–53)
AST: 24 U/L (ref 0–37)
Calcium: 8.5 mg/dL (ref 8.4–10.5)
Chloride: 101 mEq/L (ref 96–112)
Creatinine, Ser: 1.3 mg/dL (ref 0.4–1.5)
Glucose, Bld: 119 mg/dL — ABNORMAL HIGH (ref 70–99)
Potassium: 4.7 mEq/L (ref 3.5–5.1)
Sodium: 134 mEq/L — ABNORMAL LOW (ref 135–145)
Total Bilirubin: 0.8 mg/dL (ref 0.3–1.2)
Total Protein: 7 g/dL (ref 6.0–8.3)

## 2012-01-03 LAB — TSH: TSH: 1.1 u[IU]/mL (ref 0.35–5.50)

## 2012-01-03 LAB — LIPID PANEL
Cholesterol: 166 mg/dL (ref 0–200)
HDL: 40.2 mg/dL (ref >39.00)
Total CHOL/HDL Ratio: 4
Triglycerides: 181 mg/dL — ABNORMAL HIGH (ref 0.0–149.0)
VLDL: 36.2 mg/dL (ref 0.0–40.0)

## 2012-01-03 LAB — BRAIN NATRIURETIC PEPTIDE: Pro B Natriuretic peptide (BNP): 57 pg/mL (ref 0.0–100.0)

## 2012-01-03 NOTE — Telephone Encounter (Signed)
Pt scheduled for an appointment with Dr. Debby Bud tomorrow since he is the Pt PCP.

## 2012-01-03 NOTE — Telephone Encounter (Signed)
Pt was notified that his CXR results were normal. Pt stated that he still felt as thought there was fluid and movement in his lungs.

## 2012-01-03 NOTE — Telephone Encounter (Signed)
He should come in for a recheck and some labs

## 2012-01-04 ENCOUNTER — Encounter: Payer: Self-pay | Admitting: Internal Medicine

## 2012-01-04 ENCOUNTER — Ambulatory Visit: Payer: 59 | Admitting: Internal Medicine

## 2012-01-04 LAB — CARDIAC PANEL
CK-MB: 2.6 ng/mL (ref 0.3–4.0)
Relative Index: 3.1 calc — ABNORMAL HIGH (ref 0.0–2.5)
Total CK: 85 U/L (ref 7–232)

## 2012-01-05 ENCOUNTER — Encounter: Payer: Self-pay | Admitting: Internal Medicine

## 2012-01-05 ENCOUNTER — Ambulatory Visit: Payer: 59 | Admitting: Internal Medicine

## 2012-01-12 ENCOUNTER — Ambulatory Visit (INDEPENDENT_AMBULATORY_CARE_PROVIDER_SITE_OTHER): Payer: 59 | Admitting: Internal Medicine

## 2012-01-12 ENCOUNTER — Encounter: Payer: Self-pay | Admitting: Internal Medicine

## 2012-01-12 VITALS — BP 122/68 | HR 70 | Temp 98.1°F | Resp 16 | Wt 165.0 lb

## 2012-01-12 DIAGNOSIS — K219 Gastro-esophageal reflux disease without esophagitis: Secondary | ICD-10-CM

## 2012-01-12 DIAGNOSIS — R9431 Abnormal electrocardiogram [ECG] [EKG]: Secondary | ICD-10-CM

## 2012-01-12 DIAGNOSIS — I10 Essential (primary) hypertension: Secondary | ICD-10-CM

## 2012-01-12 NOTE — Progress Notes (Signed)
  Subjective:    Patient ID: Zachary Padilla, male    DOB: [birth year], 78 y.o.   MRN: [id number]  HPI Mr. Eisler had been at [facility]. He came back with a persistent cough and bloated feeling in the abdomen, like a balloon in the stomach. Uncomfortable enough that he would have to hold his belly to control the discomfort. He saw Dr. Yetta Barre August 26th- note reviewed: question of bronchitis vs cardiac issues due to EKG with minor changes with T-wave inversion and question of old anterior injury. No old studies to compare and he had no prior history. Subsequently he went to [facility], saw [facility]. EKG was similar to previous study in March '13. He has been scheduled for a nuclear stress test for risk stratification.   For his abdominal pain he did well with Gas-ex and antacids. He saw Dr. Abigail Miyamoto who agreed on the GI diagnosis.  PMH, FamHx and SocHx reviewed for any changes and relevance. Current Outpatient Prescriptions on File Prior to Visit  Medication Sig Dispense Refill  . Azelaic Acid (FINACEA) 15 % cream Apply 1 application topically 2 (two) times daily as needed. For Roscacea      . losartan (COZAAR) 50 MG tablet Take 50 mg by mouth.           Review of Systems System review is negative for any constitutional, cardiac, pulmonary, GI or neuro symptoms or complaints other than as described in the HPI.     Objective:   Physical Exam Filed Vitals:   01/12/12 1418  BP: 122/68  Pulse: 70  Temp: 98.1 F (36.7 C)  Resp: 16   Gen'l WNWD [place] man in no distress HEENT - C&S clear Cor- RRR Pulm - normal respirations Abd - no distention or bloating.       Assessment & Plan:

## 2012-01-12 NOTE — Assessment & Plan Note (Signed)
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 He has been scheduled for nuc stress

## 2012-01-12 NOTE — Assessment & Plan Note (Signed)
Flare of abdominal bloating most likely related to dyspepsia/GERD  Plan - H2 blocker full dose bid x 2 week, qHS  X 2 weeks then prn.

## 2012-01-12 NOTE — Assessment & Plan Note (Signed)
BP Readings from Last 3 Encounters:  01/12/12 122/68  01/02/12 126/70  10/27/10 112/70   Good control on present medications.

## 2012-01-16 ENCOUNTER — Telehealth: Payer: Self-pay | Admitting: Internal Medicine

## 2012-01-16 NOTE — Telephone Encounter (Signed)
The pt called hoping to dispute his no-show charge.  He states he changed his appt with Dr.Norins (the one he was "no showed" for) to see Dr.Jones sooner.  There was a mis-communication about canceling his appointment with Dr.Norins.  Pt hoping to get a call back when/if fee is removed, thanks!

## 2012-01-19 NOTE — Telephone Encounter (Signed)
Done, pt notified.

## 2012-01-30 ENCOUNTER — Encounter: Payer: Self-pay | Admitting: Internal Medicine

## 2012-03-15 ENCOUNTER — Other Ambulatory Visit: Payer: Self-pay | Admitting: Family Medicine

## 2012-03-15 DIAGNOSIS — E222 Syndrome of inappropriate secretion of antidiuretic hormone: Secondary | ICD-10-CM

## 2012-03-19 ENCOUNTER — Other Ambulatory Visit: Payer: Self-pay | Admitting: Family Medicine

## 2012-03-20 ENCOUNTER — Other Ambulatory Visit: Payer: 59

## 2012-03-21 ENCOUNTER — Inpatient Hospital Stay: Admission: RE | Admit: 2012-03-21 | Payer: 59 | Source: Ambulatory Visit

## 2012-03-22 ENCOUNTER — Ambulatory Visit
Admission: RE | Admit: 2012-03-22 | Discharge: 2012-03-22 | Disposition: A | Discharge status: Home / Self Care | Payer: Medicare Other | Source: Ambulatory Visit | Attending: Family Medicine | Admitting: Family Medicine

## 2012-03-22 ENCOUNTER — Other Ambulatory Visit: Payer: Self-pay | Admitting: Internal Medicine

## 2012-03-22 ENCOUNTER — Encounter: Payer: Self-pay | Admitting: Internal Medicine

## 2012-03-22 DIAGNOSIS — E222 Syndrome of inappropriate secretion of antidiuretic hormone: Secondary | ICD-10-CM

## 2012-03-22 DIAGNOSIS — R9389 Abnormal findings on diagnostic imaging of other specified body structures: Secondary | ICD-10-CM

## 2012-03-22 MED ORDER — IOHEXOL 300 MG/ML  SOLN
75.0000 mL | Freq: Once | INTRAMUSCULAR | Status: AC | PRN
  Administered 2012-03-22: 75 mL via INTRAVENOUS

## 2012-03-23 ENCOUNTER — Other Ambulatory Visit: Payer: 59

## 2012-03-26 ENCOUNTER — Ambulatory Visit
Admission: RE | Admit: 2012-03-26 | Discharge: 2012-03-26 | Disposition: A | Discharge status: Home / Self Care | Payer: Medicare Other | Source: Ambulatory Visit | Attending: Internal Medicine | Admitting: Internal Medicine

## 2012-03-26 DIAGNOSIS — R9389 Abnormal findings on diagnostic imaging of other specified body structures: Secondary | ICD-10-CM

## 2012-03-27 ENCOUNTER — Other Ambulatory Visit: Payer: Medicare Other

## 2012-03-27 ENCOUNTER — Telehealth: Payer: Self-pay | Admitting: Internal Medicine

## 2012-03-27 DIAGNOSIS — E041 Nontoxic single thyroid nodule: Secondary | ICD-10-CM

## 2012-03-27 NOTE — Telephone Encounter (Signed)
Pt called requesting results from test done yesterday. Pt states he is going out of town in the morning. Please advise.

## 2012-03-27 NOTE — Telephone Encounter (Signed)
Patient is going out of town early in the morning and he would like to speak with someone about his results from testing he had done yesterday before we close today

## 2012-03-27 NOTE — Telephone Encounter (Signed)
If he can't be reached on the home phone, please call his cell with the test results.  Leaving town ear[place] tomorrow morning.

## 2012-03-27 NOTE — Telephone Encounter (Signed)
Called patient with report. Wants to move ahead with u/s guided biopsy. Order entered

## 2012-04-12 ENCOUNTER — Other Ambulatory Visit (HOSPITAL_COMMUNITY)
Admission: RE | Admit: 2012-04-12 | Discharge: 2012-04-12 | Disposition: A | Discharge status: Home / Self Care | Payer: Medicare Other | Source: Ambulatory Visit | Attending: Interventional Radiology | Admitting: Interventional Radiology

## 2012-04-12 ENCOUNTER — Ambulatory Visit
Admission: RE | Admit: 2012-04-12 | Discharge: 2012-04-12 | Disposition: A | Discharge status: Home / Self Care | Payer: Medicare Other | Source: Ambulatory Visit | Attending: Internal Medicine | Admitting: Internal Medicine

## 2012-04-12 DIAGNOSIS — E041 Nontoxic single thyroid nodule: Secondary | ICD-10-CM

## 2012-04-12 DIAGNOSIS — E049 Nontoxic goiter, unspecified: Secondary | ICD-10-CM | POA: Insufficient documentation

## 2012-04-20 ENCOUNTER — Telehealth: Payer: Self-pay | Admitting: *Deleted

## 2012-04-20 NOTE — Telephone Encounter (Signed)
Left msg on triage wanting results back from biopsy done last Thursday...Raechel Chute

## 2012-04-23 NOTE — Telephone Encounter (Signed)
Notified pt with md response. Pt is also wanting copy mail to his address. Mail per pt request...lmb

## 2012-04-23 NOTE — Telephone Encounter (Signed)
BENIGN follicular tissue.

## 2012-09-25 ENCOUNTER — Other Ambulatory Visit: Payer: Self-pay | Admitting: Cardiovascular Disease

## 2012-10-02 ENCOUNTER — Telehealth: Payer: Self-pay | Admitting: Cardiovascular Disease

## 2012-10-02 NOTE — Telephone Encounter (Signed)
Message forwarded to K. Vogel, RN.  

## 2012-10-02 NOTE — Telephone Encounter (Signed)
I called [facility] and did a Pa over the phone for Brand name cozaar.  Pt didn't tolerate the losartan and is allergic to ace inhibitors.  Cozaar was approved 09-02-12 to 10-02-13. Fax sent to [facility] pharmacy

## 2012-10-02 NOTE — Telephone Encounter (Signed)
Pt state he have been waiting for 4 or 5 days for his Cozaar! Please take care of this-pharmacy said they have faxed and called!Please take care of this today!

## 2012-10-02 NOTE — Telephone Encounter (Signed)
Already done. Cozaar approved

## 2012-11-21 ENCOUNTER — Other Ambulatory Visit: Payer: Self-pay | Admitting: Dermatology

## 2013-02-11 ENCOUNTER — Ambulatory Visit (INDEPENDENT_AMBULATORY_CARE_PROVIDER_SITE_OTHER): Payer: Medicare Other | Admitting: Internal Medicine

## 2013-02-11 ENCOUNTER — Encounter: Payer: Self-pay | Admitting: Internal Medicine

## 2013-02-11 ENCOUNTER — Telehealth: Payer: Self-pay | Admitting: Internal Medicine

## 2013-02-11 VITALS — BP 128/70 | HR 72 | Temp 98.2°F | Resp 12 | Wt 157.0 lb

## 2013-02-11 DIAGNOSIS — I10 Essential (primary) hypertension: Secondary | ICD-10-CM

## 2013-02-11 DIAGNOSIS — R42 Dizziness and giddiness: Secondary | ICD-10-CM

## 2013-02-11 DIAGNOSIS — J019 Acute sinusitis, unspecified: Secondary | ICD-10-CM

## 2013-02-11 DIAGNOSIS — R9431 Abnormal electrocardiogram [ECG] [EKG]: Secondary | ICD-10-CM

## 2013-02-11 MED ORDER — CEFUROXIME AXETIL 500 MG PO TABS: ORAL_TABLET | ORAL | Status: DC

## 2013-02-11 NOTE — Telephone Encounter (Signed)
Patient Information:  Caller Name: Cedar  Phone: [phone]  Patient: Zachary Padilla, Zachary Padilla  Gender: Male  DOB: 1928/05/22  Age: 79 Years  PCP: Illene Regulus (Adults only)  Office Follow Up:  Does the office need to follow up with this patient?: No  Instructions For The Office: N/A  RN Note:  Erich states he has an appt at 4:00pm today d/t intermittent feelings of faintness since this morning.  Has not passed out.  Denies difficulty breathing or chest pain.  Denies symptoms at this time.  Advised to keep appt for today.  Symptoms  Reason For Call & Symptoms: Intermittent feelings of faintness  Reviewed Health History In EMR: Yes  Reviewed Medications In EMR: Yes  Reviewed Allergies In EMR: Yes  Reviewed Surgeries / Procedures: Yes  Date of Onset of Symptoms: 02/11/2013  Guideline(s) Used:  Dizziness  Disposition Per Guideline:   See Today in Office  Reason For Disposition Reached:   Patient wants to be seen  Advice Given:  Call Back If:  Passes out (faints)  You become worse.  Patient Refused Recommendation:  Patient Will Make Own Appointment  Patient already has appt for today at 1600 with Dr. Posey Rea.

## 2013-02-11 NOTE — Assessment & Plan Note (Signed)
Ceftin 500 bid

## 2013-02-11 NOTE — Progress Notes (Signed)
  Subjective:    Patient ID: Zachary Padilla, male    DOB: 12-Aug-1928, 79 y.o.   MRN: [id number]  HPI C/o chills and lightheaded episodes today C/o green nasal d/c x1-wk   Review of Systems  Constitutional: Negative for fever, chills, appetite change, fatigue and unexpected weight change.  HENT: Positive for congestion, rhinorrhea, postnasal drip and sinus pressure. Negative for nosebleeds, sore throat, sneezing, trouble swallowing and neck pain.   Eyes: Negative for itching and visual disturbance.  Respiratory: Negative for cough, chest tightness and shortness of breath.   Cardiovascular: Negative for chest pain, palpitations and leg swelling.  Gastrointestinal: Negative for nausea, diarrhea, blood in stool and abdominal distention.  Genitourinary: Negative for frequency and hematuria.  Musculoskeletal: Negative for back pain, joint swelling and gait problem.  Skin: Negative for rash.  Neurological: Positive for light-headedness. Negative for dizziness, tremors, speech difficulty, weakness and numbness.  Psychiatric/Behavioral: Negative for sleep disturbance, dysphoric mood and agitation. The patient is not nervous/anxious.        Objective:   Physical Exam  Constitutional: He is oriented to person, place, and time. He appears well-developed. No distress.  NAD  HENT:  Mouth/Throat: Oropharynx is clear and moist. No oropharyngeal exudate.  Swollen nasal mucosa  Eyes: Conjunctivae are normal. Pupils are equal, round, and reactive to light.  Neck: Normal range of motion. No JVD present. No thyromegaly present.  Cardiovascular: Normal rate, regular rhythm, normal heart sounds and intact distal pulses.  Exam reveals no gallop and no friction rub.   No murmur heard. Pulmonary/Chest: Effort normal and breath sounds normal. No respiratory distress. He has no wheezes. He has no rales. He exhibits no tenderness.  Abdominal: Soft. Bowel sounds are normal. He exhibits no distension and no mass.  There is no tenderness. There is no rebound and no guarding.  Musculoskeletal: Normal range of motion. He exhibits no edema and no tenderness.  Lymphadenopathy:    He has no cervical adenopathy.  Neurological: He is alert and oriented to person, place, and time. He has normal reflexes. No cranial nerve deficit. He exhibits normal muscle tone. He displays a negative Romberg sign. Coordination and gait normal.  No meningeal signs  Skin: Skin is warm and dry. No rash noted.  Psychiatric: He has a normal mood and affect. His behavior is normal. Judgment and thought content normal.    EKG      Assessment & Plan:

## 2013-02-11 NOTE — Patient Instructions (Signed)
Go to ER if problems 

## 2013-02-11 NOTE — Assessment & Plan Note (Signed)
EKG  No orthostatic BP drop

## 2013-02-12 ENCOUNTER — Other Ambulatory Visit (INDEPENDENT_AMBULATORY_CARE_PROVIDER_SITE_OTHER): Payer: Medicare Other

## 2013-02-12 DIAGNOSIS — R42 Dizziness and giddiness: Secondary | ICD-10-CM

## 2013-02-12 DIAGNOSIS — J019 Acute sinusitis, unspecified: Secondary | ICD-10-CM

## 2013-02-12 DIAGNOSIS — I10 Essential (primary) hypertension: Secondary | ICD-10-CM

## 2013-02-12 LAB — CBC WITH DIFFERENTIAL/PLATELET
Basophils Relative: 0.5 % (ref 0.0–3.0)
Eosinophils Absolute: 0 10*3/uL (ref 0.0–0.7)
HCT: 40.1 % (ref 39.0–52.0)
Hemoglobin: 13.7 g/dL (ref 13.0–17.0)
MCHC: 34.1 g/dL (ref 30.0–36.0)
MCV: 95.4 fl (ref 78.0–100.0)
Monocytes Absolute: 0.4 10*3/uL (ref 0.1–1.0)
Neutro Abs: 3.8 10*3/uL (ref 1.4–7.7)
RBC: 4.21 Mil/uL — ABNORMAL LOW (ref 4.22–5.81)

## 2013-02-12 LAB — BASIC METABOLIC PANEL
BUN: 17 mg/dL (ref 6–23)
Chloride: 102 mEq/L (ref 96–112)
Creatinine, Ser: 1.1 mg/dL (ref 0.4–1.5)
GFR: 69.55 mL/min (ref >60.00)

## 2013-02-12 LAB — URINALYSIS
Bilirubin Urine: NEGATIVE
Ketones, ur: NEGATIVE
Total Protein, Urine: NEGATIVE
pH: 6 (ref 5.0–8.0)

## 2013-02-12 NOTE — Assessment & Plan Note (Signed)
Appt w/Dr Allyson Sabal on 02/13/13

## 2013-02-13 ENCOUNTER — Encounter: Payer: Self-pay | Admitting: Cardiovascular Disease

## 2013-02-13 ENCOUNTER — Ambulatory Visit (INDEPENDENT_AMBULATORY_CARE_PROVIDER_SITE_OTHER): Payer: Medicare Other | Admitting: Cardiovascular Disease

## 2013-02-13 VITALS — BP 144/80 | HR 72 | Ht 70.0 in | Wt 158.5 lb

## 2013-02-13 DIAGNOSIS — IMO0001 Reserved for inherently not codable concepts without codable children: Secondary | ICD-10-CM

## 2013-02-13 DIAGNOSIS — I712 Thoracic aortic aneurysm, without rupture, unspecified: Secondary | ICD-10-CM

## 2013-02-13 DIAGNOSIS — I4510 Unspecified right bundle-branch block: Secondary | ICD-10-CM

## 2013-02-13 DIAGNOSIS — E785 Hyperlipidemia, unspecified: Secondary | ICD-10-CM | POA: Insufficient documentation

## 2013-02-13 DIAGNOSIS — I10 Essential (primary) hypertension: Secondary | ICD-10-CM

## 2013-02-13 DIAGNOSIS — Z79899 Other long term (current) drug therapy: Secondary | ICD-10-CM

## 2013-02-13 NOTE — Assessment & Plan Note (Signed)
Well-controlled on current medications 

## 2013-02-13 NOTE — Patient Instructions (Addendum)
   Non-Cardiac CT scanning, (CAT scanning), is a noninvasive, special x-ray that produces cross-sectional images of the body using x-rays and a computer. CT scans help physicians diagnose and treat medical conditions. For some CT exams, a contrast material is used to enhance visibility in the area of the body being studied. CT scans provide greater clarity and reveal more details than regular x-ray exams.  Your physician recommends that you schedule a follow-up appointment in: 1 year

## 2013-02-13 NOTE — Assessment & Plan Note (Signed)
Found on CT scan November of 2013 measuring 4.3 cm. We will recheck a CT angiogram for size

## 2013-02-13 NOTE — Progress Notes (Signed)
     02/13/2013 Zachary Padilla   10-Feb-1929  [id number]  Primary Physician Illene Regulus, MD Primary Cardiologist: Runell Gess MD Roseanne Reno   HPI:  [name] is an 79 year old in and fit appearing married Caucasian male father of 2, grandfather 4 grandchildren who I saw your ago. He has a history of hypertension, mild hypokalemia and family history of heart disease as well as chronic right bundle branch block. His mother died of an MI at age 51. A Myoview stress test performed 01/19/12 was entirely normal. He denies chest pain or shortness of breath.   Current Outpatient Prescriptions  Medication Sig Dispense Refill  . Azelaic Acid (FINACEA) 15 % cream Apply 1 application topically 2 (two) times daily as needed. For Roscacea      . cefUROXime (CEFTIN) 500 MG tablet 1 po bid  20 tablet  1  . Cholecalciferol (VITAMIN D) 2000 UNITS CAPS Take 1 capsule by mouth daily.      Marland Kitchen COZAAR 50 MG tablet TAKE ONE TABLET EVERY DAY  30 tablet  11  . guaiFENesin (MUCINEX) 600 MG 12 hr tablet Take 600 mg by mouth every 12 (twelve) hours.       No current facility-administered medications for this visit.    Allergies  Allergen Reactions  . Erythromycin Nausea And Vomiting  . Levofloxacin Other (See Comments)    dehydration  . Penicillins Nausea And Vomiting  . Sulfonamide Derivatives Nausea And Vomiting    History   Social History  . Marital Status: Married    Spouse Name: N/A    Number of Children: N/A  . Years of Education: N/A   Occupational History  . Not on file.   Social History Main Topics  . Smoking status: Never Smoker   . Smokeless tobacco: Never Used     Comment: Lives with spouse-independant in all ADLs. [employer] and [employer] at [employer], Married in "52"  . Alcohol Use: No  . Drug Use: No  . Sexual Activity: Yes   Other Topics Concern  . Not on file   Social History Narrative  . No narrative on file     Review of  Systems: General: negative for chills, fever, night sweats or weight changes.  Cardiovascular: negative for chest pain, dyspnea on exertion, edema, orthopnea, palpitations, paroxysmal nocturnal dyspnea or shortness of breath Dermatological: negative for rash Respiratory: negative for cough or wheezing Urologic: negative for hematuria Abdominal: negative for nausea, vomiting, diarrhea, bright red blood per rectum, melena, or hematemesis Neurologic: negative for visual changes, syncope, or dizziness All other systems reviewed and are otherwise negative except as noted above.    Blood pressure 144/80, pulse 72, height 5\' 10"  (1.778 m), weight 158 lb 8 oz (71.895 kg).  General appearance: alert Neck: no adenopathy, no carotid bruit, no JVD, supple, symmetrical, trachea midline and thyroid not enlarged, symmetric, no tenderness/mass/nodules Lungs: clear to auscultation bilaterally Heart: regular rate and rhythm, S1, S2 normal, no murmur, click, rub or gallop Extremities: extremities normal, atraumatic, no cyanosis or edema  EKG sinus rhythm at 62 with a right bundle-branch block (outside EKG)  ASSESSMENT AND PLAN:   HYPERTENSION Well-controlled on current medications  Thoracic aortic aneurysm Found on CT scan November of 2013 measuring 4.3 cm. We will recheck a CT angiogram for size      Runell Gess MD [facility] 02/13/2013 3:57 PM

## 2013-02-15 ENCOUNTER — Telehealth: Payer: Self-pay | Admitting: Cardiovascular Disease

## 2013-02-15 ENCOUNTER — Encounter: Payer: Self-pay | Admitting: Internal Medicine

## 2013-02-15 NOTE — Telephone Encounter (Signed)
Please call-scheduled to have CT-concerned because he is on an antibiotic.

## 2013-02-15 NOTE — Telephone Encounter (Signed)
Patient concerned because he has been on a antibiotic for a few days, and wondered if this would affect the test he is having. Informed patient that on our end it should not. He can call [facility] to confirm that there will not be a problem on there end with the antibiotic conflicting with the contrast. Patient states that he will contact [facility].

## 2013-02-18 ENCOUNTER — Ambulatory Visit
Admission: RE | Admit: 2013-02-18 | Discharge: 2013-02-18 | Disposition: A | Discharge status: Home / Self Care | Payer: Medicare Other | Source: Ambulatory Visit | Attending: Cardiovascular Disease | Admitting: Cardiovascular Disease

## 2013-02-18 DIAGNOSIS — IMO0001 Reserved for inherently not codable concepts without codable children: Secondary | ICD-10-CM

## 2013-02-18 MED ORDER — IOHEXOL 350 MG/ML SOLN
80.0000 mL | Freq: Once | INTRAVENOUS | Status: AC | PRN
  Administered 2013-02-18: 80 mL via INTRAVENOUS

## 2013-02-20 ENCOUNTER — Telehealth: Payer: Self-pay | Admitting: Cardiovascular Disease

## 2013-02-20 ENCOUNTER — Encounter: Payer: Self-pay | Admitting: *Deleted

## 2013-02-20 NOTE — Telephone Encounter (Signed)
Pt called stated that we called him 2 times and no message was left. I didn't see where anyone called. He also said that someone was supposed to call him yesterday with some results.

## 2013-02-20 NOTE — Telephone Encounter (Signed)
Message forwarded to K. Vogel, RN.  

## 2013-02-20 NOTE — Telephone Encounter (Signed)
Ct results reviewed with patient.

## 2013-03-12 ENCOUNTER — Other Ambulatory Visit: Payer: Self-pay | Admitting: *Deleted

## 2013-03-14 ENCOUNTER — Encounter: Payer: Self-pay | Admitting: Internal Medicine

## 2013-03-14 ENCOUNTER — Ambulatory Visit (INDEPENDENT_AMBULATORY_CARE_PROVIDER_SITE_OTHER): Payer: Medicare Other | Admitting: Internal Medicine

## 2013-03-14 VITALS — BP 140/68 | HR 68 | Temp 97.5°F | Wt 158.0 lb

## 2013-03-14 DIAGNOSIS — I10 Essential (primary) hypertension: Secondary | ICD-10-CM

## 2013-03-14 DIAGNOSIS — R42 Dizziness and giddiness: Secondary | ICD-10-CM

## 2013-03-14 DIAGNOSIS — I712 Thoracic aortic aneurysm, without rupture: Secondary | ICD-10-CM

## 2013-03-14 MED ORDER — PANTOPRAZOLE SODIUM 40 MG PO TBEC: 40.0000 mg | DELAYED_RELEASE_TABLET | Freq: Every day | ORAL | Status: DC

## 2013-03-14 NOTE — Patient Instructions (Signed)
1. Dizziness - your exam today is normal: no sign of stroke or brain abnormality. Plan But Bonine (meclizine) over the counter. The next time you have any dizziness take 1/2 a tablet to see if it relieves the dizziness  If bonine doesn't help we may need to move on to an MRI brain and brainstem.  2. Hoarseness - most likely cause is silent reflux. Plan Pantoprazole 40 mg once a day for several weeks. If no improvement ENT for exam of vocal chords.  3. Blood pressure - good control on present medications  4. Sebaceous cyst - the area on the abdomen looks and smells like a sebaceous cyst - an infected sebum gland (the oil gland at the base of a hair follicle). Plan Apply warm compress for 10 minutes 2 or 3 times a day.  Return office visit next Monday - will reexam and possible excise this.    Epidermal Cyst An epidermal cyst is sometimes called a sebaceous cyst, epidermal inclusion cyst, or infundibular cyst. These cysts usually contain a substance that looks "pasty" or "cheesy" and may have a bad smell. This substance is a protein called keratin. Epidermal cysts are usually found on the face, neck, or trunk. They may also occur in the vaginal area or other parts of the genitalia of both men and women. Epidermal cysts are usually small, painless, slow-growing bumps or lumps that move freely under the skin. It is important not to try to pop them. This may cause an infection and lead to tenderness and swelling. CAUSES  Epidermal cysts may be caused by a deep penetrating injury to the skin or a plugged hair follicle, often associated with acne. SYMPTOMS  Epidermal cysts can become inflamed and cause:  Redness.  Tenderness.  Increased temperature of the skin over the bumps or lumps.  Grayish-Burzynski, bad smelling material that drains from the bump or lump. DIAGNOSIS  Epidermal cysts are easily diagnosed by your caregiver during an exam. Rarely, a tissue sample (biopsy) may be taken to rule out  other conditions that may resemble epidermal cysts. TREATMENT   Epidermal cysts often get better and disappear on their own. They are rarely ever cancerous.  If a cyst becomes infected, it may become inflamed and tender. This may require opening and draining the cyst. Treatment with antibiotics may be necessary. When the infection is gone, the cyst may be removed with minor surgery.  Small, inflamed cysts can often be treated with antibiotics or by injecting steroid medicines.  Sometimes, epidermal cysts become large and bothersome. If this happens, surgical removal in your caregiver's office may be necessary. HOME CARE INSTRUCTIONS  Only take over-the-counter or prescription medicines as directed by your caregiver.  Take your antibiotics as directed. Finish them even if you start to feel better. SEEK MEDICAL CARE IF:   Your cyst becomes tender, red, or swollen.  Your condition is not improving or is getting worse.  You have any other questions or concerns. MAKE SURE YOU:  Understand these instructions.  Will watch your condition.  Will get help right away if you are not doing well or get worse. Document Released: 03/26/2004 Document Revised: 07/18/2011 Document Reviewed: 11/01/2010 [facility] Patient Information 2014 [place], [place].

## 2013-03-14 NOTE — Progress Notes (Signed)
,  Pre visit review using our clinic review tool, if applicable. No additional management support is needed unless otherwise documented below in the visit note.  

## 2013-03-14 NOTE — Progress Notes (Signed)
Subjective:    Patient ID: Zachary Padilla, male    DOB: Jul 14, 1928, 79 y.o.   MRN: [id number]  HPI Zachary Padilla was seen 10/6 by DR. AVP and diagnosed with sinus infection and was treated with ceftin x 10 days with good results. He did have "dizziness" at that time which seemed to resolve. This past Monday he had recurrent dizziness on several occasions. He describes this as a vague sensation, like he was going to go out, also some mild dysequilibrium.   He was seen by Dr. Allyson Sabal Oct 8th - normal exam. He did have a CT angio chest with no change in diameter of aorta.  He also has had a hoarseness over the past several weeks. He does have a h/o GERD but is currently not taking any medication.   He has a small lesion on the abdomen, red and sore.   Past Medical History  Diagnosis Date  . 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 ALLERGIC RHINITIS 02/23/2009  . COLONIC POLYPS, HX OF 02/23/2009  . DIVERTICULITIS, HX OF 02/23/2009  . GERD 02/23/2009  . HYPERTENSION 02/23/2009  . URINARY INCONTINENCE 02/23/2009  . PONV (postoperative nausea and vomiting)   . Thoracic aortic aneurysm   . Hyperlipidemia   . Family history of heart disease    Past Surgical History  Procedure Laterality Date  . Appendectomy  1946  . Sinus surgery  2008  . Removal of pre-cancer bump on neck  2005  . Left ear tube  2001  . Ruptured appendics  1946   Family History  Problem Relation Age of Onset  . Heart disease Other   . Stroke Other    History   Social History  . Marital Status: Married    Spouse Name: N/A    Number of Children: N/A  . Years of Education: N/A   Occupational History  . Not on file.   Social History Main Topics  . Smoking status: Never Smoker   . Smokeless tobacco: Never Used     Comment: Lives with spouse-independant in all ADLs. [employer] and [employer] at [employer], Married in "52"  . Alcohol Use: No  . Drug Use: No  . Sexual Activity: Yes   Other Topics Concern  . Not  on file   Social History Narrative  . No narrative on file    Current Outpatient Prescriptions on File Prior to Visit  Medication Sig Dispense Refill  . Azelaic Acid (FINACEA) 15 % cream Apply 1 application topically 2 (two) times daily as needed. For Roscacea      . cefUROXime (CEFTIN) 500 MG tablet 1 po bid  20 tablet  1  . Cholecalciferol (VITAMIN D) 2000 UNITS CAPS Take 1 capsule by mouth daily.      Marland Kitchen COZAAR 50 MG tablet TAKE ONE TABLET EVERY DAY  30 tablet  11  . guaiFENesin (MUCINEX) 600 MG 12 hr tablet Take 600 mg by mouth every 12 (twelve) hours as needed.        No current facility-administered medications on file prior to visit.      Review of Systems System review is negative for any constitutional, cardiac, pulmonary, GI or neuro symptoms or complaints other than as described in the HPI.     Objective:   Physical Exam Filed Vitals:   03/14/13 1046  BP: 140/68  Pulse: 68  Temp: 97.5 F (36.4 C)   Gen'l- WNWD man in no distress HEENT TMs normal, C& S clear Neck -  no thyromegaly Cor - 2+ radial, RRR PUlm - CTAP Neuro - CN II-XII grossly normal: normal facial symmetry, PERRLA, EOMI, normal shoulder shrug. MS 5/5. Cerebellar no tremor, normal gait. Derm - small area of induration mid-abdomen, erythema and small amount of sebum (by look and smell) expressed.       Assessment & Plan:  1. Superficial abscess - mid abdomen. May be a sebaceous cyst vs furuncle.  Plan No indication for antibiotics  Apply warm compresses BID  Return Monday, Nov 10th  For re-evaluation and probable I&D  2. Hoarseness - most likely cause is silent reflux. Plan Pantoprazole 40 mg once a day for several weeks. If no improvement ENT for exam of vocal chords.

## 2013-03-16 NOTE — Assessment & Plan Note (Signed)
Dizziness - your exam today is normal: no sign of stroke or brain abnormality. Plan But Bonine (meclizine) over the counter. The next time you have any dizziness take 1/2 a tablet to see if it relieves the dizziness  If bonine doesn't help we may need to move on to an MRI brain and brainstem.

## 2013-03-16 NOTE — Assessment & Plan Note (Signed)
Taking and tolerating Cozaar 50 mg daily.  BP Readings from Last 3 Encounters:  03/14/13 140/68  02/13/13 144/80  02/11/13 128/70   Adequate control.  Plan Continue present medication.

## 2013-03-19 ENCOUNTER — Encounter: Payer: Self-pay | Admitting: Internal Medicine

## 2013-03-19 ENCOUNTER — Ambulatory Visit (INDEPENDENT_AMBULATORY_CARE_PROVIDER_SITE_OTHER): Payer: Medicare Other | Admitting: Internal Medicine

## 2013-03-19 VITALS — BP 132/96 | HR 71 | Temp 97.3°F | Wt 161.0 lb

## 2013-03-19 DIAGNOSIS — L089 Local infection of the skin and subcutaneous tissue, unspecified: Secondary | ICD-10-CM

## 2013-03-19 DIAGNOSIS — L723 Sebaceous cyst: Secondary | ICD-10-CM

## 2013-03-19 NOTE — Progress Notes (Signed)
  Subjective:    Patient ID: Zachary Padilla, male    DOB: January 14, 1929, 79 y.o.   MRN: [id number]  HPI Zachary Padilla was seen last week for a boil vs infected sebaceous cyst mid-abdomen. Over the w/e he has been applying warm compresses as directed. He returns today of I&D. He has not had any fevers or chills, there has been minimal extension of erythema.  PMH, FamHx and SocHx reviewed for any changes and relevance. Current Outpatient Prescriptions on File Prior to Visit  Medication Sig Dispense Refill  . Azelaic Acid (FINACEA) 15 % cream Apply 1 application topically 2 (two) times daily as needed. For Roscacea      . Cholecalciferol (VITAMIN D) 2000 UNITS CAPS Take 1 capsule by mouth daily.      Marland Kitchen COZAAR 50 MG tablet TAKE ONE TABLET EVERY DAY  30 tablet  11  . guaiFENesin (MUCINEX) 600 MG 12 hr tablet Take 600 mg by mouth every 12 (twelve) hours as needed.       . pantoprazole (PROTONIX) 40 MG tablet Take 1 tablet (40 mg total) by mouth daily.  30 tablet  3  . Probiotic Product (ALIGN PO) Take 1 capsule by mouth daily.       No current facility-administered medications on file prior to visit.      Review of Systems System review is negative for any constitutional, cardiac, pulmonary, GI or neuro symptoms or complaints other than as described in the HPI.     Objective:   Physical Exam Filed Vitals:   03/19/13 1446  BP: 132/96  Pulse: 71  Temp: 97.3 F (36.3 C)   Gen'l - WNWD man in no distress Derm - fluctulant lesion mid-abdomen 2 cm across with frank purulent drainage.  Procedure - I&D cyst on abdomen Consent - patient advised of risks of bleeding and infection with procedure. Written consent obtained. Prep - alcohol swab followed by anesthesia with 2% xylocain w/ epi; prepped with betadiene With sterile technique the lesion was incised with a #11 scalpel. 4-5 cc thick waxy material expressed from the wound. Typical cyst capsule was resected piecemeal using single tooth forceps and  curved hemostat. The wound was closed with 3 interrupted sutures of 3-0 ethilon. Blood loss - none. Patient tolerated procedure well.        Assessment & Plan:  Sebaceous cyst I&D with excision of capsule. Patient tolerated well. Wound care - wash with soap and water daily, apply triple antibiotic ointment and cover with bandaide Precautions - for infection given with instructions to return for changes. ROV Monday, Nov 17th for suture removal.

## 2013-03-19 NOTE — Progress Notes (Signed)
Pre visit review using our clinic review tool, if applicable. No additional management support is needed unless otherwise documented below in the visit note. 

## 2013-03-19 NOTE — Patient Instructions (Signed)
Procedure - incision, drainage and piecemeal removal of sebaceous cyst capsule - closed with three stitches  Wound care: Daily  Remove bandaide  In shower or at the sink wash with soap and water  Dry and apply triple antibiotic ointment  Cover with a large bandaide  Call for increasing redness, swelling, red streaking, drainage, fever - tell staff that I said you were to be seen when you call. If it is Sunday and this happens go to [facility] - next to [place]  Return in 1 week for suture removal.   Epidermal Cyst An epidermal cyst is sometimes called a sebaceous cyst, epidermal inclusion cyst, or infundibular cyst. These cysts usually contain a substance that looks "pasty" or "cheesy" and may have a bad smell. This substance is a protein called keratin. Epidermal cysts are usually found on the face, neck, or trunk. They may also occur in the vaginal area or other parts of the genitalia of both men and women. Epidermal cysts are usually small, painless, slow-growing bumps or lumps that move freely under the skin. It is important not to try to pop them. This may cause an infection and lead to tenderness and swelling. CAUSES  Epidermal cysts may be caused by a deep penetrating injury to the skin or a plugged hair follicle, often associated with acne. SYMPTOMS  Epidermal cysts can become inflamed and cause:  Redness.  Tenderness.  Increased temperature of the skin over the bumps or lumps.  Grayish-Rabold, bad smelling material that drains from the bump or lump. DIAGNOSIS  Epidermal cysts are easily diagnosed by your caregiver during an exam. Rarely, a tissue sample (biopsy) may be taken to rule out other conditions that may resemble epidermal cysts. TREATMENT   Epidermal cysts often get better and disappear on their own. They are rarely ever cancerous.  If a cyst becomes infected, it may become inflamed and tender. This may require opening and draining the cyst. Treatment with  antibiotics may be necessary. When the infection is gone, the cyst may be removed with minor surgery.  Small, inflamed cysts can often be treated with antibiotics or by injecting steroid medicines.  Sometimes, epidermal cysts become large and bothersome. If this happens, surgical removal in your caregiver's office may be necessary. HOME CARE INSTRUCTIONS  Only take over-the-counter or prescription medicines as directed by your caregiver.  Take your antibiotics as directed. Finish them even if you start to feel better. SEEK MEDICAL CARE IF:   Your cyst becomes tender, red, or swollen.  Your condition is not improving or is getting worse.  You have any other questions or concerns. MAKE SURE YOU:  Understand these instructions.  Will watch your condition.  Will get help right away if you are not doing well or get worse. Document Released: 03/26/2004 Document Revised: 07/18/2011 Document Reviewed: 11/01/2010 [facility] Patient Information 2014 [place], [place].

## 2013-03-20 ENCOUNTER — Telehealth: Payer: Self-pay | Admitting: *Deleted

## 2013-03-20 NOTE — Telephone Encounter (Signed)
Wash, dry and put a bandaide on once a day as directed.

## 2013-03-20 NOTE — Telephone Encounter (Signed)
Pt called states Dr Debby Bud advised him to use triple antibiotic ointment on abd wound and to change bandage once daily.  Pt states the ointment package advises to change bandage 2-3 times daily.  Pt called requesting which directions he is to follow.  Please advise

## 2013-03-21 ENCOUNTER — Ambulatory Visit (INDEPENDENT_AMBULATORY_CARE_PROVIDER_SITE_OTHER): Payer: Medicare Other | Admitting: Internal Medicine

## 2013-03-21 ENCOUNTER — Ambulatory Visit: Payer: Medicare Other | Admitting: Internal Medicine

## 2013-03-21 ENCOUNTER — Encounter: Payer: Self-pay | Admitting: Internal Medicine

## 2013-03-21 VITALS — BP 120/60 | HR 76 | Temp 97.2°F | Wt 161.0 lb

## 2013-03-21 DIAGNOSIS — Z5189 Encounter for other specified aftercare: Secondary | ICD-10-CM

## 2013-03-21 NOTE — Progress Notes (Signed)
Pre visit review using our clinic review tool, if applicable. No additional management support is needed unless otherwise documented below in the visit note. 

## 2013-03-21 NOTE — Telephone Encounter (Signed)
Spoke with pt advised of MDs message 

## 2013-03-24 NOTE — Progress Notes (Signed)
  Subjective:    Patient ID: Zachary Padilla, male    DOB: 06/28/1928, 79 y.o.   MRN: [id number]  HPI Zachary Padilla had I&D for infected sebaceous cyst Tuesday, 03/19/13. He returns today with a concern about drainage from the wound site. He has had no fever, no swelling at the wound, no pain.  PMH, FamHx and SocHx reviewed for any changes and relevance.  Current Outpatient Prescriptions on File Prior to Visit  Medication Sig Dispense Refill  . Azelaic Acid (FINACEA) 15 % cream Apply 1 application topically 2 (two) times daily as needed. For Roscacea      . Cholecalciferol (VITAMIN D) 2000 UNITS CAPS Take 1 capsule by mouth daily.      Marland Kitchen COZAAR 50 MG tablet TAKE ONE TABLET EVERY DAY  30 tablet  11  . guaiFENesin (MUCINEX) 600 MG 12 hr tablet Take 600 mg by mouth every 12 (twelve) hours as needed.       . pantoprazole (PROTONIX) 40 MG tablet Take 1 tablet (40 mg total) by mouth daily.  30 tablet  3  . Probiotic Product (ALIGN PO) Take 1 capsule by mouth daily.       No current facility-administered medications on file prior to visit.      Review of Systems No Fevers or chills.    Objective:   Physical Exam Filed Vitals:   03/21/13 1511  BP: 120/60  Pulse: 76  Temp: 97.2 F (36.2 C)   Derm - incision site below the umbilicus is clean and dry. There is minimal erythema, no exudate.        Assessment & Plan:  Wound care - the incision site does not appear to be infected.  Plan Return for suture removal as scheduled.

## 2013-03-25 ENCOUNTER — Encounter: Payer: Self-pay | Admitting: Internal Medicine

## 2013-03-25 ENCOUNTER — Ambulatory Visit: Payer: Medicare Other | Admitting: Internal Medicine

## 2013-03-25 ENCOUNTER — Ambulatory Visit (INDEPENDENT_AMBULATORY_CARE_PROVIDER_SITE_OTHER): Payer: Medicare Other | Admitting: Internal Medicine

## 2013-03-25 VITALS — BP 122/72 | HR 74 | Temp 97.9°F | Wt 157.0 lb

## 2013-03-25 DIAGNOSIS — Z4802 Encounter for removal of sutures: Secondary | ICD-10-CM

## 2013-03-25 DIAGNOSIS — L723 Sebaceous cyst: Secondary | ICD-10-CM

## 2013-03-25 NOTE — Progress Notes (Signed)
Pre visit review using our clinic review tool, if applicable. No additional management support is needed unless otherwise documented below in the visit note. 

## 2013-03-26 NOTE — Progress Notes (Signed)
  Subjective:    Patient ID: Zachary Padilla, male    DOB: 05-16-1928, 79 y.o.   MRN: [id number]  HPI Mr. Hemphill presents for suture removal after I&D with capsule excision of infra umbilical sebaceous cyst . He has had a small amount of serosanguinous drainage. No fever or chills, no pain.  PMH, FamHx and SocHx reviewed for any changes and relevance.  Med - no change since last visit.   Review of Systems Negative for fever, chills, pain    Objective:   Physical Exam  Filed Vitals:   03/25/13 1419  BP: 122/72  Pulse: 74  Temp: 97.9 F (36.6 C)   Derm - wound site with some firm induration, minimal erythema. Sutures removed w/o difficulty. Slight superficial dehiscence noted. No purulent drainage.      Assessment & Plan:  Sebaceous cyst - resolved.

## 2013-04-01 ENCOUNTER — Telehealth: Payer: Self-pay

## 2013-04-01 ENCOUNTER — Encounter: Payer: Self-pay | Admitting: Internal Medicine

## 2013-04-01 ENCOUNTER — Ambulatory Visit (INDEPENDENT_AMBULATORY_CARE_PROVIDER_SITE_OTHER): Payer: Medicare Other | Admitting: Internal Medicine

## 2013-04-01 ENCOUNTER — Other Ambulatory Visit: Payer: Medicare Other

## 2013-04-01 VITALS — BP 140/76 | HR 77 | Temp 98.2°F | Wt 159.0 lb

## 2013-04-01 DIAGNOSIS — R109 Unspecified abdominal pain: Secondary | ICD-10-CM

## 2013-04-01 DIAGNOSIS — S76219A Strain of adductor muscle, fascia and tendon of unspecified thigh, initial encounter: Secondary | ICD-10-CM

## 2013-04-01 DIAGNOSIS — IMO0002 Reserved for concepts with insufficient information to code with codable children: Secondary | ICD-10-CM

## 2013-04-01 LAB — URINALYSIS, ROUTINE W REFLEX MICROSCOPIC
Bilirubin Urine: NEGATIVE
Hgb urine dipstick: NEGATIVE
Nitrite: NEGATIVE
Total Protein, Urine: NEGATIVE

## 2013-04-01 NOTE — Progress Notes (Signed)
Pre visit review using our clinic review tool, if applicable. No additional management support is needed unless otherwise documented below in the visit note. 

## 2013-04-01 NOTE — Telephone Encounter (Signed)
OK to add on. He needs to stop at the lab for a STAT u/a (order entered)

## 2013-04-01 NOTE — Telephone Encounter (Signed)
Phone call from patient and he left a message on the voicemail stating he believes he has a kidney stone and would like an appt to see you today

## 2013-04-01 NOTE — Progress Notes (Signed)
  Subjective:    Patient ID: Zachary Padilla, male    DOB: [birth year], 79 y.o.   MRN: [id number]  HPI Mr. Shoe presents for the sudden on-set of pain in the left groin that started yesterday morning and hurt most of the day. At times the pain would radiate down the medial aspect of the thigh but not below the knee. He had not been working or doing anything that would cause a strain. He denies having any hematuria. This AM the pain is much improved.    PMH, FamHx and SocHx reviewed for any changes and relevance.  Current Outpatient Prescriptions on File Prior to Visit  Medication Sig Dispense Refill  . Azelaic Acid (FINACEA) 15 % cream Apply 1 application topically 2 (two) times daily as needed. For Roscacea      . Cholecalciferol (VITAMIN D) 2000 UNITS CAPS Take 1 capsule by mouth daily.      Marland Kitchen COZAAR 50 MG tablet TAKE ONE TABLET EVERY DAY  30 tablet  11  . guaiFENesin (MUCINEX) 600 MG 12 hr tablet Take 600 mg by mouth every 12 (twelve) hours as needed.       . pantoprazole (PROTONIX) 40 MG tablet Take 1 tablet (40 mg total) by mouth daily.  30 tablet  3  . Probiotic Product (ALIGN PO) Take 1 capsule by mouth daily.       No current facility-administered medications on file prior to visit.     Review of Systems System review is negative for any constitutional, cardiac, pulmonary, GI or neuro symptoms or complaints other than as described in the HPI.     Objective:   Physical Exam Filed Vitals:   04/01/13 1647  BP: 140/76  Pulse: 77  Temp: 98.2 F (36.8 C)   gen'l - WNWD man in no distress. Cor - RRR Pulm - CTAP Abd- no tenderness to percussion over the flanks MSK - tender to palpation left groin, no tenderness with internal or external rotation of the left hip, no pain with pressure against the greater trochanter  U/A micro negative with 0-2 RBC       Assessment & Plan:  1. Pain in the left groin most consistent with a groin strain. No evidence on exam of tenderness in the  kidney, no evidence of hip joint problems, urinalysis is negative making a kidney stone very unlikely.  Plan Go easy  OK to use aleve twice a day  Heat will help.

## 2013-04-01 NOTE — Telephone Encounter (Signed)
Patient to be added on at 4:30 and was advised to stop by the lab first.

## 2013-04-01 NOTE — Patient Instructions (Signed)
We need to stop meeting this way!!!  Pain in the left groin most consistent with a groin strain. No evidence on exam of tenderness in the kidney, no evidence of hip joint problems, urinalysis is negative making a kidney stone very unlikely.  Plan Go easy  OK to use aleve twice a day  Heat will help.  Groin Strain A groin strain (also called a groin pull) is an injury to the muscles or tendon on the upper inner part of the thigh. These muscles are called the adductor muscles or groin muscles. They are responsible for moving the leg across the body. A muscle strain occurs when a muscle is overstretched and some muscle fibers are torn. A groin strain can range from mild to severe depending on how many muscle fibers are affected and whether the muscle fibers are partially or completely torn.  Groin strains usually occur during exercise or participation in sports. The injury often happens when a sudden, violent force is placed on a muscle, stretching the muscle too far. A strain is more likely to occur when your muscles are not warmed up or if you are not properly conditioned. Depending on the severity of the groin strain, recovery time may vary from a few weeks to several weeks. Severe injuries often require 4 6 weeks for recovery. In these cases, complete healing can take 4 5 months.  CAUSES   Stretching the groin muscles too far or too suddenly, often during side-to-side motion with an abrupt change in direction.  Putting repeated stress on the groin muscles over a long period of time.  Performing vigorous activity without properly stretching the groin muscles beforehand. SYMPTOMS   Pain and tenderness in the groin area. This begins as sharp pain and persists as a dull ache.  Popping or snapping feeling when the injury occurs (for severe strains).  Swelling or bruising.  Muscle spasms.  Weakness in the leg.  Stiffness in the groin area with decreased ability to move the affected  muscles. DIAGNOSIS  Your caregiver will perform a physical exam to diagnose a groin strain. You will be asked about your symptoms and how the injury occurred. X-rays are sometimes needed to rule out a broken bone or cartilage problems. Your caregiver may order a CT scan or MRI if a complete muscle tear is suspected. TREATMENT  A groin strain will often heal on its own. Your caregiver may prescribe medicines to help manage pain and swelling (anti-inflammatory medicine). You may be told to use crutches for the first few days to minimize your pain. HOME CARE INSTRUCTIONS   Rest. Do not use the strained muscle if it causes pain.  Put ice on the injured area.  Put ice in a plastic bag.  Place a towel between your skin and the bag.  Leave the ice on for 15 20 minutes, every 2 3 hours. Do this for the first 2 days after the injury.  Only take over-the-counter or prescription medicines as directed by your caregiver.  Wrap the injured area with an elastic bandage as directed by your caregiver.  Keep the injured leg raised (elevated).  Walk, stretch, and perform range-of-motion exercises to improve blood flow to the injured area. Only perform these activities if you can do so without any pain. To prevent muscle strains:  Warm up before exercise.  Develop proper conditioning and strength in the groin muscles. SEEK IMMEDIATE MEDICAL CARE IF:   You have increased pain or swelling in the affected area.  Your symptoms are not improving or are getting worse. MAKE SURE YOU:   Understand these instructions.  Will watch your condition.  Will get help right away if you are not doing well or get worse. Document Released: 12/22/2003 Document Revised: 04/11/2012 Document Reviewed: 12/28/2011 [facility] Patient Information 2014 [place], [place].

## 2013-04-15 ENCOUNTER — Telehealth: Payer: Self-pay | Admitting: Internal Medicine

## 2013-04-15 NOTE — Telephone Encounter (Signed)
Rec'd from [facility] forward 4 pages to Dr.Norins ° °

## 2013-05-23 ENCOUNTER — Ambulatory Visit (INDEPENDENT_AMBULATORY_CARE_PROVIDER_SITE_OTHER): Payer: Medicare Other | Admitting: Internal Medicine

## 2013-05-23 ENCOUNTER — Encounter: Payer: Self-pay | Admitting: Internal Medicine

## 2013-05-23 VITALS — BP 140/70 | HR 70 | Temp 96.9°F | Wt 157.8 lb

## 2013-05-23 DIAGNOSIS — J019 Acute sinusitis, unspecified: Secondary | ICD-10-CM

## 2013-05-23 MED ORDER — CEFUROXIME AXETIL 250 MG PO TABS: 250.0000 mg | ORAL_TABLET | Freq: Two times a day (BID) | ORAL | Status: DC

## 2013-05-23 NOTE — Progress Notes (Signed)
Pre visit review using our clinic review tool, if applicable. No additional management support is needed unless otherwise documented below in the visit note. 

## 2013-05-23 NOTE — Progress Notes (Signed)
   Subjective:    Patient ID: Zachary Padilla, male    DOB: 06-30-1928, 80 y.o.   MRN: [id number]  HPI Jan 13th he awoke with a lot of dizziness, headache, hot then cool. Still feels his head is stuffy. He did have some blood tinged mucus. He has been using nasal saline, mucinex with some relief of chest congestion and head pressure. He does report a metallic taste and acrid smell that is associated with sinus infection. No fever, slightly colored mucus. His symptoms are not as bad as back October 6th when he was treated for sinusitis by Dr. Alain Marion using ceftin.   Past Medical History  Diagnosis Date  . 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 ALLERGIC RHINITIS 02/23/2009  . COLONIC POLYPS, HX OF 02/23/2009  . DIVERTICULITIS, HX OF 02/23/2009  . GERD 02/23/2009  . HYPERTENSION 02/23/2009  . URINARY INCONTINENCE 02/23/2009  . PONV (postoperative nausea and vomiting)   . Thoracic aortic aneurysm   . Hyperlipidemia   . Family history of heart disease    Past Surgical History  Procedure Laterality Date  . Appendectomy  1946  . Sinus surgery  2008  . Removal of pre-cancer bump on neck  2005  . Left ear tube  2001  . Ruptured appendics  1946   Family History  Problem Relation Age of Onset  . Heart disease Other   . Stroke Other    History   Social History  . Marital Status: Married    Spouse Name: N/A    Number of Children: N/A  . Years of Education: N/A   Occupational History  . Not on file.   Social History Main Topics  . Smoking status: Never Smoker   . Smokeless tobacco: Never Used     Comment: Lives with spouse-independant in all ADLs. [employer] and [employer] at [employer], Married in "52"  . Alcohol Use: No  . Drug Use: No  . Sexual Activity: Yes   Other Topics Concern  . Not on file   Social History Narrative  . No narrative on file       Review of Systems System review is negative for any constitutional, cardiac, pulmonary, GI or neuro symptoms or  complaints other than as described in the HPI.       Objective:   Physical Exam Filed Vitals:   05/23/13 1141  BP: 140/70  Pulse: 70  Temp: 96.9 F (36.1 C)   gen'l - WNWD   HEENT- No significant tenderness to percussion over the facial sinus Nodes - none Cor - RRR Pulm - CTAP       Assessment & Plan:  Sinusitis - patient with mild symptoms but very similar to the early symptoms that have been associated with progressive sinus infections.  Plan  Ceftin 250 mb BID x 7  Supportive care: nasal saline, mucinex, tylenol for fever, hydrate.

## 2013-05-23 NOTE — Patient Instructions (Signed)
Sinusitis - patient with mild symptoms but very similar to the early symptoms that have been associated with progressive sinus infections.  Plan  Ceftin 250 mb BID x 7  Supportive care: nasal saline, mucinex, tylenol for fever, hydrate.

## 2013-06-25 ENCOUNTER — Ambulatory Visit: Payer: Medicare Other | Admitting: Cardiology

## 2013-06-27 ENCOUNTER — Ambulatory Visit: Payer: Medicare Other | Admitting: Physician Assistant

## 2013-07-16 ENCOUNTER — Ambulatory Visit: Payer: Medicare Other | Admitting: Cardiology

## 2013-07-18 ENCOUNTER — Telehealth: Payer: Self-pay | Admitting: Cardiovascular Disease

## 2013-07-18 NOTE — Telephone Encounter (Signed)
He needs to have upper tooth extracted. He needs to know if this all right to do?

## 2013-07-18 NOTE — Telephone Encounter (Signed)
Please advise 

## 2013-07-18 NOTE — Telephone Encounter (Signed)
Returning your call. °

## 2013-07-18 NOTE — Telephone Encounter (Signed)
Returned call.  No answer/voicemail.   Message forwarded to Curt Bears, RN to discuss w/ Dr. Gwenlyn Found.

## 2013-07-19 NOTE — Telephone Encounter (Signed)
Okay to have his teeth extracted

## 2013-07-22 NOTE — Telephone Encounter (Signed)
Patient notified of results.

## 2013-08-09 ENCOUNTER — Encounter: Payer: Self-pay | Admitting: Cardiovascular Disease

## 2013-08-09 ENCOUNTER — Ambulatory Visit (INDEPENDENT_AMBULATORY_CARE_PROVIDER_SITE_OTHER): Payer: Medicare Other | Admitting: Cardiovascular Disease

## 2013-08-09 VITALS — BP 142/90 | HR 66 | Ht 69.0 in | Wt 159.1 lb

## 2013-08-09 DIAGNOSIS — I712 Thoracic aortic aneurysm, without rupture, unspecified: Secondary | ICD-10-CM

## 2013-08-09 DIAGNOSIS — I10 Essential (primary) hypertension: Secondary | ICD-10-CM

## 2013-08-09 DIAGNOSIS — Z79899 Other long term (current) drug therapy: Secondary | ICD-10-CM

## 2013-08-09 NOTE — Assessment & Plan Note (Signed)
4.3 cm by CT angiogram performed October 2013. We will recheck a C T. Angiogram  for progression of disease.

## 2013-08-09 NOTE — Patient Instructions (Signed)
Dr Gwenlyn Found has ordered you to have a Non-Cardiac CT Angiogram (CTA), is a special type of CT scan that uses a computer to produce multi-dimensional views of major blood vessels throughout the body. In CT angiography, a contrast material is injected through an IV to help visualize the blood vessels  Dr Gwenlyn Found wants you to follow-up in 6 months. You will receive a reminder letter in the mail one months in advance. If you don't receive a letter, please call our office to schedule the follow-up appointment.

## 2013-08-09 NOTE — Assessment & Plan Note (Signed)
Controlled on current medications 

## 2013-08-09 NOTE — Progress Notes (Signed)
08/09/2013 Zachary Padilla   03-24-1929  [id number]  Primary Physician Adella Hare, MD Primary Cardiologist: Lorretta Harp MD Renae Gloss   HPI:  Zachary Padilla is an 80 year old in and fit appearing married Caucasian male father of 2, grandfather 4 grandchildren who I saw your ago. He has a history of hypertension, mild hypokalemia and family history of heart disease as well as chronic right bundle branch block. His mother died of an MI at age 85. A Myoview stress test performed 01/19/12 was entirely normal. He denies chest pain or shortness of breath. 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 I'm going to recheck a CTA. He also describes 6-8 episodes of presyncope since I saw him last which he ascribes to his sinusitis. He has never passed out. We talked about event monitoring versus a loop recorder I will continue to follow this as an outpatient.    Current Outpatient Prescriptions  Medication Sig Dispense Refill  . Azelaic Acid (FINACEA) 15 % cream Apply 1 application topically 2 (two) times daily as needed. For Roscacea      . cefUROXime (CEFTIN) 250 MG tablet Take 1 tablet (250 mg total) by mouth 2 (two) times daily with a meal.  14 tablet  0  . Cholecalciferol (VITAMIN D) 2000 UNITS CAPS Take 1 capsule by mouth daily.      Marland Kitchen COZAAR 50 MG tablet TAKE ONE TABLET EVERY DAY  30 tablet  11  . guaiFENesin (MUCINEX) 600 MG 12 hr tablet Take 600 mg by mouth every 12 (twelve) hours as needed.       . Probiotic Product (ALIGN PO) Take 1 capsule by mouth daily.       No current facility-administered medications for this visit.    Allergies  Allergen Reactions  . Erythromycin Nausea And Vomiting  . Levofloxacin Other (See Comments)    dehydration  . Penicillins Nausea And Vomiting  . Sulfonamide Derivatives Nausea And Vomiting    History     Social History  . Marital Status: Married    Spouse Name: N/A    Number of Children: N/A  . Years of Education: N/A   Occupational History  . Not on file.   Social History Main Topics  . Smoking status: Never Smoker   . Smokeless tobacco: Never Used     Comment: Lives with spouse-independant in all ADLs. [employer] and [employer] at [employer], Married in "52"  . Alcohol Use: No  . Drug Use: No  . Sexual Activity: Yes   Other Topics Concern  . Not on file   Social History Narrative  . No narrative on file     Review of Systems: General: negative for chills, fever, night sweats or weight changes.  Cardiovascular: negative for chest pain, dyspnea on exertion, edema, orthopnea, palpitations, paroxysmal nocturnal dyspnea or shortness of breath Dermatological: negative for rash Respiratory: negative for cough or wheezing Urologic: negative for hematuria Abdominal: negative for nausea, vomiting, diarrhea, bright red blood per rectum, melena, or hematemesis Neurologic: negative for visual changes, syncope, or dizziness All other systems reviewed and are otherwise negative except as noted above.    Blood pressure 142/90, pulse 66, height 5\' 9"  (1.753 m), weight 159 lb 1.6 oz (72.167 kg).  General appearance: alert and no distress Neck: no adenopathy, no carotid bruit, no JVD, supple, symmetrical, trachea  midline and thyroid not enlarged, symmetric, no tenderness/mass/nodules Lungs: clear to auscultation bilaterally Heart: regular rate and rhythm, S1, S2 normal, no murmur, click, rub or gallop Extremities: extremities normal, atraumatic, no cyanosis or edema  EKG sinus rhythm at 66 with right bundle-branch block  ASSESSMENT AND PLAN:   HYPERTENSION Controlled on current medications  Thoracic aortic aneurysm 4.3 cm by CT angiogram performed October 2013. We will recheck a C T. Angiogram  for progression of disease.      Lorretta Harp MD  FACP,FACC,FAHA, [facility] 08/09/2013 10:30 AM

## 2013-08-14 ENCOUNTER — Telehealth: Payer: Self-pay | Admitting: Cardiovascular Disease

## 2013-08-14 LAB — BASIC METABOLIC PANEL
BUN: 14 mg/dL (ref 6–23)
CALCIUM: 8.6 mg/dL (ref 8.4–10.5)
CO2: 25 mEq/L (ref 19–32)
Chloride: 101 mEq/L (ref 96–112)
Creat: 1.1 mg/dL (ref 0.50–1.35)
GLUCOSE: 95 mg/dL (ref 70–99)
Potassium: 4.1 mEq/L (ref 3.5–5.3)
SODIUM: 134 meq/L — AB (ref 135–145)

## 2013-08-14 NOTE — Telephone Encounter (Signed)
Spoke to [facility] - BMP results are available. Sodium 134, all other components in normal limits  Patient is having a CT Angio chest on 08/15/13 ordered by Dr Gwenlyn Found.

## 2013-08-15 ENCOUNTER — Other Ambulatory Visit: Payer: Medicare Other

## 2013-08-15 ENCOUNTER — Encounter: Payer: Self-pay | Admitting: *Deleted

## 2013-08-15 ENCOUNTER — Ambulatory Visit
Admission: RE | Admit: 2013-08-15 | Discharge: 2013-08-15 | Disposition: A | Discharge status: Home / Self Care | Payer: Medicare Other | Source: Ambulatory Visit | Attending: Cardiovascular Disease | Admitting: Cardiovascular Disease

## 2013-08-15 DIAGNOSIS — I712 Thoracic aortic aneurysm, without rupture, unspecified: Secondary | ICD-10-CM

## 2013-08-15 MED ORDER — IOHEXOL 350 MG/ML SOLN
75.0000 mL | Freq: Once | INTRAVENOUS | Status: AC | PRN
  Administered 2013-08-15: 75 mL via INTRAVENOUS

## 2013-08-20 ENCOUNTER — Encounter: Payer: Self-pay | Admitting: *Deleted

## 2013-08-20 ENCOUNTER — Telehealth: Payer: Self-pay | Admitting: Cardiovascular Disease

## 2013-08-20 NOTE — Telephone Encounter (Signed)
Wanting to know the results of his lab work .Marland Kitchen Please try the cell number first .

## 2013-08-20 NOTE — Telephone Encounter (Signed)
Returned call and pt verified x 2.  Pt informed message received and letter was mailed.  Pt stated he received letter w/ labs, but not MRI.  RN asked pt if he was referring to the CT scan and he confirmed.  Pt informed Dr. Gwenlyn Found just reviewed the results last night and Curt Bears would not have had a chance to contact him b/c they have been in clinic all day.  Pt informed she will likely still mail a letter w/ the results and RN gave verbal results to pt, per Result Note.  Pt informed he will need to f/u with his PCP r/t small mass on the tail of his pancreas and repeat CT scan in October of this year.  Pt did not quite understand that f/u was not r/t heart and stated he will wait to see what the letter says.    Message forwarded to Curt Bears, [place].

## 2013-09-20 ENCOUNTER — Other Ambulatory Visit: Payer: Self-pay | Admitting: Cardiovascular Disease

## 2013-09-20 NOTE — Telephone Encounter (Signed)
Rx refill sent to patient pharmacy   

## 2013-10-22 ENCOUNTER — Telehealth: Payer: Self-pay | Admitting: Cardiovascular Disease

## 2013-10-22 NOTE — Telephone Encounter (Signed)
Spoke with patient to clarify medication, we have no record that he is on Coreg. Patient states the medication is COZAAR, pharmacy is supposed to fax a form over to [place] for him to get his new refills because insurance will no longer pay. He leaves for vacation Friday and would like to try and have it by then. Informed patient I would look and or call the pharmacy for the form and hopefully he will get it before Friday. Patient voiced understanding.

## 2013-10-22 NOTE — Telephone Encounter (Signed)
Pt called and said he can no longer get his Coreg,he needs you to call and to give prior approval for it please.His prescription need to go to [facility].

## 2013-10-23 ENCOUNTER — Telehealth: Payer: Self-pay | Admitting: *Deleted

## 2013-10-23 NOTE — Telephone Encounter (Signed)
I called [facility] and did a PA over the phone.  Patient has a history of being intolerant to losartan and allergic to ace inhibitors   PA approved!  09/23/13-10/22/13 PA # [id number]

## 2013-11-14 ENCOUNTER — Telehealth: Payer: Self-pay | Admitting: Cardiovascular Disease

## 2013-11-14 NOTE — Telephone Encounter (Signed)
Message sent to Dr.Berry for advice. 

## 2013-11-14 NOTE — Telephone Encounter (Signed)
Pt is having cataract surgery on Wednesday,he wants to know if this will be all right with his condition?

## 2013-11-15 NOTE — Telephone Encounter (Signed)
Cleared for cataract surgery.

## 2013-11-18 NOTE — Telephone Encounter (Signed)
Patient notified

## 2014-01-23 ENCOUNTER — Ambulatory Visit (INDEPENDENT_AMBULATORY_CARE_PROVIDER_SITE_OTHER): Payer: Medicare Other | Admitting: Cardiovascular Disease

## 2014-01-23 ENCOUNTER — Encounter: Payer: Self-pay | Admitting: Cardiovascular Disease

## 2014-01-23 VITALS — BP 110/68 | HR 65 | Ht 69.0 in | Wt 159.0 lb

## 2014-01-23 DIAGNOSIS — I712 Thoracic aortic aneurysm, without rupture, unspecified: Secondary | ICD-10-CM

## 2014-01-23 DIAGNOSIS — E785 Hyperlipidemia, unspecified: Secondary | ICD-10-CM

## 2014-01-23 MED ORDER — COZAAR 50 MG PO TABS: ORAL_TABLET | ORAL | Status: DC

## 2014-01-23 NOTE — Assessment & Plan Note (Signed)
Followed by his PCP 

## 2014-01-23 NOTE — Assessment & Plan Note (Signed)
Controlled on current medications 

## 2014-01-23 NOTE — Assessment & Plan Note (Signed)
This was last checked April 2015 measuring 41 mm. Will recheck a CT angiogram of his chest in April 2016

## 2014-01-23 NOTE — Patient Instructions (Signed)
Your physician wants you to follow-up in: 1 year with Dr Gwenlyn Found.  You will receive a reminder letter in the mail two months in advance. If you don't receive a letter, please call our office to schedule the follow-up appointment.  We will do a CT scan of your chest to evaluate the thoracic aortic aneurysm in April 2016.

## 2014-01-23 NOTE — Progress Notes (Signed)
01/23/2014 Zachary Padilla   [birth year]  [id number]  Primary Physician Zachary Hare, MD Primary Cardiologist: Zachary Harp MD Zachary Padilla   HPI:  Zachary Padilla is an 80 year old in and fit appearing married Caucasian male father of 2, grandfather 4 grandchildren who I saw your ago. He has a history of hypertension, mild hyperlipidemia and family history of heart disease as well as chronic right bundle branch block. His mother died of an MI at age 69. A Myoview stress test performed 01/19/12 was entirely normal. He denies chest pain or shortness of breath. A chest CT angiogram was performed 03/22/12 for evaluation of potential lung cancer did incidentally reveal a 4.3 cm descending thoracic aortic aneurysm which he has been asymptomatic fromthis was rechecked April of this year and was unchanged. He denies chest pain or shortness of breath.    Current Outpatient Prescriptions  Medication Sig Dispense Refill  . Azelaic Acid (FINACEA) 15 % cream Apply 1 application topically 2 (two) times daily as needed. For Roscacea      . Bromfenac Sodium (PROLENSA OP) Apply 1 drop to eye daily. Right eye only      . COZAAR 50 MG tablet TAKE ONE TABLET EACH DAY  30 tablet  5  . guaiFENesin (MUCINEX) 600 MG 12 hr tablet Take 600 mg by mouth every 12 (twelve) hours as needed.        No current facility-administered medications for this visit.    Allergies  Allergen Reactions  . Ace Inhibitors   . Erythromycin Nausea And Vomiting  . Levofloxacin Other (See Comments)    dehydration  . Losartan Potassium   . Penicillins Nausea And Vomiting  . Sulfonamide Derivatives Nausea And Vomiting    History   Social History  . Marital Status: Married    Spouse Name: N/A    Number of Children: N/A  . Years of Education: N/A   Occupational History  . Not on file.   Social History Main Topics  . Smoking status: Never Smoker   . Smokeless tobacco: Never Used     Comment: Lives with  spouse-independant in all ADLs. [employer] and [employer] at [employer], Married in "52"  . Alcohol Use: No  . Drug Use: No  . Sexual Activity: Yes   Other Topics Concern  . Not on file   Social History Narrative  . No narrative on file     Review of Systems: General: negative for chills, fever, night sweats or weight changes.  Cardiovascular: negative for chest pain, dyspnea on exertion, edema, orthopnea, palpitations, paroxysmal nocturnal dyspnea or shortness of breath Dermatological: negative for rash Respiratory: negative for cough or wheezing Urologic: negative for hematuria Abdominal: negative for nausea, vomiting, diarrhea, bright red blood per rectum, melena, or hematemesis Neurologic: negative for visual changes, syncope, or dizziness All other systems reviewed and are otherwise negative except as noted above.    Blood pressure 110/68, pulse 65, height 5\' 9"  (1.753 m), weight 159 lb (72.122 kg).  General appearance: alert and no distress Neck: no adenopathy, no carotid bruit, no JVD, supple, symmetrical, trachea midline and thyroid not enlarged, symmetric, no tenderness/mass/nodules Lungs: clear to auscultation bilaterally Heart: regular rate and rhythm, S1, S2 normal, no murmur, click, rub or gallop Extremities: extremities normal, atraumatic, no cyanosis or edema  EKG Normal sinus rhythm at 65 with a bundle-branch block and lower limb voltage  ASSESSMENT AND PLAN:   Thoracic aortic aneurysm This was last checked April 2015  measuring 41 mm. Will recheck a CT angiogram of his chest in April 2016  Hyperlipidemia Followed by his PCP  HYPERTENSION Controlled on current medications      Zachary Harp MD [facility], [facility] 01/23/2014 3:31 PM

## 2014-02-10 ENCOUNTER — Ambulatory Visit: Payer: Medicare Other

## 2014-02-11 ENCOUNTER — Telehealth: Payer: Self-pay

## 2014-02-11 NOTE — Telephone Encounter (Signed)
LVM for pt to call back. Needs to confirm PCP (new) and schedule AWV/CPE

## 2014-02-28 ENCOUNTER — Ambulatory Visit: Payer: Medicare Other | Admitting: *Deleted

## 2014-03-04 ENCOUNTER — Ambulatory Visit (INDEPENDENT_AMBULATORY_CARE_PROVIDER_SITE_OTHER): Payer: Medicare Other

## 2014-03-04 ENCOUNTER — Ambulatory Visit: Payer: Medicare Other

## 2014-03-04 DIAGNOSIS — Z23 Encounter for immunization: Secondary | ICD-10-CM

## 2014-03-05 ENCOUNTER — Other Ambulatory Visit: Payer: Self-pay | Admitting: Gastroenterology

## 2014-03-05 DIAGNOSIS — K862 Cyst of pancreas: Secondary | ICD-10-CM

## 2014-03-05 DIAGNOSIS — Z139 Encounter for screening, unspecified: Secondary | ICD-10-CM

## 2014-03-14 ENCOUNTER — Other Ambulatory Visit: Payer: Medicare Other

## 2014-03-14 ENCOUNTER — Emergency Department (HOSPITAL_COMMUNITY): Payer: Medicare Other

## 2014-03-14 ENCOUNTER — Encounter (HOSPITAL_COMMUNITY): Payer: Self-pay | Admitting: *Deleted

## 2014-03-14 ENCOUNTER — Emergency Department (HOSPITAL_COMMUNITY)
Admission: EM | Admit: 2014-03-14 | Discharge: 2014-03-14 | Disposition: A | Discharge status: Home / Self Care | Payer: Medicare Other | Attending: Emergency Medicine | Admitting: Emergency Medicine

## 2014-03-14 DIAGNOSIS — Z880 Allergy status to penicillin: Secondary | ICD-10-CM | POA: Insufficient documentation

## 2014-03-14 DIAGNOSIS — I10 Essential (primary) hypertension: Secondary | ICD-10-CM | POA: Diagnosis not present

## 2014-03-14 DIAGNOSIS — Z8639 Personal history of other endocrine, nutritional and metabolic disease: Secondary | ICD-10-CM | POA: Insufficient documentation

## 2014-03-14 DIAGNOSIS — Z8719 Personal history of other diseases of the digestive system: Secondary | ICD-10-CM | POA: Insufficient documentation

## 2014-03-14 DIAGNOSIS — Z79899 Other long term (current) drug therapy: Secondary | ICD-10-CM | POA: Insufficient documentation

## 2014-03-14 DIAGNOSIS — R61 Generalized hyperhidrosis: Secondary | ICD-10-CM | POA: Diagnosis not present

## 2014-03-14 DIAGNOSIS — R42 Dizziness and giddiness: Secondary | ICD-10-CM | POA: Diagnosis not present

## 2014-03-14 DIAGNOSIS — Z86010 Personal history of colonic polyps: Secondary | ICD-10-CM | POA: Insufficient documentation

## 2014-03-14 DIAGNOSIS — R110 Nausea: Secondary | ICD-10-CM | POA: Diagnosis present

## 2014-03-14 LAB — BASIC METABOLIC PANEL
ANION GAP: 11 (ref 5–15)
BUN: 19 mg/dL (ref 6–23)
CALCIUM: 8.6 mg/dL (ref 8.4–10.5)
CHLORIDE: 99 meq/L (ref 96–112)
CO2: 25 meq/L (ref 19–32)
Creatinine, Ser: 1.16 mg/dL (ref 0.50–1.35)
GFR calc Af Amer: 65 mL/min — ABNORMAL LOW (ref >90)
GFR calc non Af Amer: 56 mL/min — ABNORMAL LOW (ref >90)
Glucose, Bld: 118 mg/dL — ABNORMAL HIGH (ref 70–99)
POTASSIUM: 4.1 meq/L (ref 3.7–5.3)
SODIUM: 135 meq/L — AB (ref 137–147)

## 2014-03-14 LAB — CBC WITH DIFFERENTIAL/PLATELET
BASOS ABS: 0 10*3/uL (ref 0.0–0.1)
Basophils Relative: 1 % (ref 0–1)
Eosinophils Absolute: 0.1 10*3/uL (ref 0.0–0.7)
Eosinophils Relative: 2 % (ref 0–5)
HCT: 38.9 % — ABNORMAL LOW (ref 39.0–52.0)
Hemoglobin: 13.5 g/dL (ref 13.0–17.0)
LYMPHS ABS: 1.1 10*3/uL (ref 0.7–4.0)
LYMPHS PCT: 19 % (ref 12–46)
MCH: 32.4 pg (ref 26.0–34.0)
MCHC: 34.7 g/dL (ref 30.0–36.0)
MCV: 93.3 fL (ref 78.0–100.0)
Monocytes Absolute: 0.4 10*3/uL (ref 0.1–1.0)
Monocytes Relative: 6 % (ref 3–12)
NEUTROS ABS: 4 10*3/uL (ref 1.7–7.7)
NEUTROS PCT: 72 % (ref 43–77)
PLATELETS: 146 10*3/uL — AB (ref 150–400)
RBC: 4.17 MIL/uL — AB (ref 4.22–5.81)
RDW: 12.3 % (ref 11.5–15.5)
WBC: 5.6 10*3/uL (ref 4.0–10.5)

## 2014-03-14 LAB — TROPONIN I

## 2014-03-14 MED ORDER — MECLIZINE HCL 25 MG PO TABS
25.0000 mg | ORAL_TABLET | Freq: Once | ORAL | Status: AC
  Administered 2014-03-14: 25 mg via ORAL
  Filled 2014-03-14: qty 1

## 2014-03-14 MED ORDER — MECLIZINE HCL 50 MG PO TABS: 50.0000 mg | ORAL_TABLET | Freq: Three times a day (TID) | ORAL | Status: DC | PRN

## 2014-03-14 MED ORDER — ONDANSETRON 4 MG PO TBDP: 4.0000 mg | ORAL_TABLET | Freq: Three times a day (TID) | ORAL | Status: DC | PRN

## 2014-03-14 MED ORDER — SODIUM CHLORIDE 0.9 % IV SOLN
Freq: Once | INTRAVENOUS | Status: AC
  Administered 2014-03-14: 09:00:00 via INTRAVENOUS

## 2014-03-14 MED ORDER — ONDANSETRON HCL 4 MG/2ML IJ SOLN
4.0000 mg | Freq: Once | INTRAMUSCULAR | Status: AC
  Administered 2014-03-14: 4 mg via INTRAVENOUS
  Filled 2014-03-14: qty 2

## 2014-03-14 MED ORDER — MECLIZINE HCL 25 MG PO TABS: 25.0000 mg | ORAL_TABLET | Freq: Three times a day (TID) | ORAL | Status: DC | PRN

## 2014-03-14 NOTE — ED Notes (Signed)
Entered room to assess response to Zofran and to ambulate patient in hallways, per Dr. Jeneen Rinks Patient and patient's family report that patient "feels queasy" and is not ready to ambulate at this time Dr. Jeneen Rinks made aware

## 2014-03-14 NOTE — ED Notes (Signed)
Patient with sudden onset of nausea, diaphoresis and dry heaves Patient alert and oriented x 4 Patient has been frequently visiting brother in hospital---may have picked up something viral PIV placed by EMS: 18 in left FA Zofran 4 mg given by EMS

## 2014-03-14 NOTE — ED Notes (Signed)
Dr. James at bedside  

## 2014-03-14 NOTE — ED Notes (Signed)
ambulated pt he did very well and ambulated independently

## 2014-03-14 NOTE — Discharge Instructions (Signed)
Take medications until 24 hours without symptoms. No driving until 24 hours without symptoms. Recheck here with any worsening of, or additional symptoms.  Benign Positional Vertigo Vertigo means you feel like you or your surroundings are moving when they are not. Benign positional vertigo is the most common form of vertigo. Benign means that the cause of your condition is not serious. Benign positional vertigo is more common in older adults. CAUSES  Benign positional vertigo is the result of an upset in the labyrinth system. This is an area in the middle ear that helps control your balance. This may be caused by a viral infection, head injury, or repetitive motion. However, often no specific cause is found. SYMPTOMS  Symptoms of benign positional vertigo occur when you move your head or eyes in different directions. Some of the symptoms may include:  Loss of balance and falls.  Vomiting.  Blurred vision.  Dizziness.  Nausea.  Involuntary eye movements (nystagmus). DIAGNOSIS  Benign positional vertigo is usually diagnosed by physical exam. If the specific cause of your benign positional vertigo is unknown, your caregiver may perform imaging tests, such as magnetic resonance imaging (MRI) or computed tomography (CT). TREATMENT  Your caregiver may recommend movements or procedures to correct the benign positional vertigo. Medicines such as meclizine, benzodiazepines, and medicines for nausea may be used to treat your symptoms. In rare cases, if your symptoms are caused by certain conditions that affect the inner ear, you may need surgery. HOME CARE INSTRUCTIONS   Follow your caregiver's instructions.  Move slowly. Do not make sudden body or head movements.  Avoid driving.  Avoid operating heavy machinery.  Avoid performing any tasks that would be dangerous to you or others during a vertigo episode.  Drink enough fluids to keep your urine clear or pale yellow. SEEK IMMEDIATE  MEDICAL CARE IF:   You develop problems with walking, weakness, numbness, or using your arms, hands, or legs.  You have difficulty speaking.  You develop severe headaches.  Your nausea or vomiting continues or gets worse.  You develop visual changes.  Your family or friends notice any behavioral changes.  Your condition gets worse.  You have a fever.  You develop a stiff neck or sensitivity to light. MAKE SURE YOU:   Understand these instructions.  Will watch your condition.  Will get help right away if you are not doing well or get worse. Document Released: 01/31/2006 Document Revised: 07/18/2011 Document Reviewed: 01/13/2011 [facility] Patient Information 2015 [place], [place]. This information is not intended to replace advice given to you by your health care provider. Make sure you discuss any questions you have with your health care provider.

## 2014-03-14 NOTE — ED Notes (Signed)
Patient transported to CT via stretcher Patient in NAD upon leaving for testing 

## 2014-03-14 NOTE — ED Notes (Signed)
Patient back from CT  Patient remains in NAD VS updated

## 2014-03-14 NOTE — ED Provider Notes (Addendum)
CSN: [id number]     Arrival date & time 03/14/14  0813 History   First MD Initiated Contact with Patient 03/14/14 [phone]     Chief Complaint  Patient presents with  . Nausea      HPI  Patient presents for evaluation of sudden onset of nausea and vomiting as well as dizziness. He had a normal day yesterday. He slept normally during the night. This morning he awakened. He was in bed for a minute or so. He sat up. He had a sudden onset of ringing in his left ear, nausea, diaphoresis, and a severe spinning sensation.  This persisted. His wife called 44. He was given Zofran by medics. He is now asymptomatic. At no time did he have chest pain, shortness of breath, actually weakness, or headache or neck pain.  He has recurrent problems with his left ear because he states "there is a hole in the eardrum". His never had vertigo before. He has never had stroke symptoms.  Past Medical History  Diagnosis Date  . 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 ALLERGIC RHINITIS 02/23/2009  . COLONIC POLYPS, HX OF 02/23/2009  . DIVERTICULITIS, HX OF 02/23/2009  . GERD 02/23/2009  . HYPERTENSION 02/23/2009  . URINARY INCONTINENCE 02/23/2009  . PONV (postoperative nausea and vomiting)   . Thoracic aortic aneurysm   . Hyperlipidemia   . Family history of heart disease   . Pre-syncope    Past Surgical History  Procedure Laterality Date  . Appendectomy  1946  . Sinus surgery  2008  . Removal of pre-cancer bump on neck  2005  . Left ear tube  2001  . Ruptured appendics  1946   Family History  Problem Relation Age of Onset  . Heart disease Other   . Stroke Other    History  Substance Use Topics  . Smoking status: Never Smoker   . Smokeless tobacco: Never Used     Comment: Lives with spouse-independant in all ADLs. [employer] and [employer] at [employer], Married in "52"  . Alcohol Use: No    Review of Systems  Constitutional: Positive for diaphoresis. Negative for fever, chills, appetite change  and fatigue.  HENT: Negative for mouth sores, sore throat and trouble swallowing.   Eyes: Negative for visual disturbance.  Respiratory: Negative for cough, chest tightness, shortness of breath and wheezing.   Cardiovascular: Negative for chest pain.  Gastrointestinal: Positive for nausea. Negative for vomiting, abdominal pain, diarrhea and abdominal distention.  Endocrine: Negative for polydipsia, polyphagia and polyuria.  Genitourinary: Negative for dysuria, frequency and hematuria.  Musculoskeletal: Negative for gait problem.  Skin: Negative for color change, pallor and rash.  Neurological: Positive for dizziness. Negative for syncope, light-headedness and headaches.  Hematological: Does not bruise/bleed easily.  Psychiatric/Behavioral: Negative for behavioral problems and confusion.      Allergies  Ace inhibitors; Ciprodex; Erythromycin; Levofloxacin; Losartan potassium; Penicillins; and Sulfonamide derivatives  Home Medications   Prior to Admission medications   Medication Sig Start Date End Date Taking? Authorizing Provider  Azelaic Acid (FINACEA) 15 % cream Apply 1 application topically 2 (two) times daily as needed. For Roscacea   Yes Historical Provider, MD  Bromfenac Sodium (PROLENSA OP) Apply 1 drop to eye daily. Right eye only   Yes Historical Provider, MD  losartan (COZAAR) 50 MG tablet Take 50 mg by mouth daily.   Yes Historical Provider, MD  sodium chloride (OCEAN) 0.65 % SOLN nasal spray Place 1-2 sprays into both nostrils at bedtime.  Yes Historical Provider, MD  COZAAR 50 MG tablet TAKE ONE TABLET EACH DAY 01/23/14   Lorretta Harp, MD  guaiFENesin (MUCINEX) 600 MG 12 hr tablet Take 600 mg by mouth every 12 (twelve) hours as needed.     Historical Provider, MD   BP 148/79 mmHg  Pulse 63  Temp(Src) 97.8 F (36.6 C) (Oral)  Resp 16  SpO2 100% Physical Exam  Constitutional: He is oriented to person, place, and time. He appears well-developed and well-nourished.  No distress.  HENT:  Head: Normocephalic.  Left TM perforation, chronic per patient.  Eyes: Conjunctivae are normal. Pupils are equal, round, and reactive to light. No scleral icterus.  Neck: Normal range of motion. Neck supple. No thyromegaly present.  Cardiovascular: Normal rate, regular rhythm and S1 normal.  Exam reveals no gallop and no friction rub.   No murmur heard. No murmurs. No carotid bruits. In particular no aortic insufficiency murmur.  Pulmonary/Chest: Effort normal and breath sounds normal. No respiratory distress. He has no wheezes. He has no rales.  Abdominal: Soft. Bowel sounds are normal. He exhibits no distension. There is no tenderness. There is no rebound.  Musculoskeletal: Normal range of motion.  Neurological: He is alert and oriented to person, place, and time.  Cranial nerves intact symmetric. Normal peripheral strength and sensation. He has nystagmus to lateral gaze, however this does not produce vertigo symptoms.  Skin: Skin is warm and dry. No rash noted.  Psychiatric: He has a normal mood and affect. His behavior is normal.    ED Course  Procedures (including critical care time) Labs Review Labs Reviewed  CBC WITH DIFFERENTIAL  BASIC METABOLIC PANEL  TROPONIN I    Imaging Review No results found.   EKG Interpretation None      MDM   Final diagnoses:  Vertigo    Doubt couple location of his known thoracic aorta. Doubt ACS. Doubt CVA. Sudden symptoms with ear symptoms vertigo and dizziness very likely peripheral vertigo. Plan is CT, EKG, troponin electrolytes, treatment with Antivert, reevaluation  10:30:  Patient improved. Mild nausea. No additional vertigo. Ambulatory. CT scan showing chronic small vessel ischemic changes. No hemorrhage. On reevaluation he has no neurological symptoms at this time.    Tanna Furry, MD 03/14/14 [id number]  Tanna Furry, MD 03/14/14 1030

## 2014-03-14 NOTE — ED Notes (Signed)
Per Dr. Jeneen Rinks, patient to be ambulated in the hallways after nausea medication takes effect Patient and patient's family made aware of plan of care

## 2014-03-24 ENCOUNTER — Other Ambulatory Visit: Payer: Medicare Other

## 2014-04-01 ENCOUNTER — Other Ambulatory Visit: Payer: Medicare Other

## 2014-04-01 ENCOUNTER — Inpatient Hospital Stay: Admission: RE | Admit: 2014-04-01 | Payer: Medicare Other | Source: Ambulatory Visit

## 2014-04-15 ENCOUNTER — Ambulatory Visit
Admission: RE | Admit: 2014-04-15 | Discharge: 2014-04-15 | Disposition: A | Discharge status: Home / Self Care | Payer: Medicare Other | Source: Ambulatory Visit | Attending: Gastroenterology | Admitting: Gastroenterology

## 2014-04-15 DIAGNOSIS — Z139 Encounter for screening, unspecified: Secondary | ICD-10-CM

## 2014-04-15 DIAGNOSIS — K862 Cyst of pancreas: Secondary | ICD-10-CM

## 2014-04-15 MED ORDER — GADOBENATE DIMEGLUMINE 529 MG/ML IV SOLN: 15.0000 mL | Freq: Once | INTRAVENOUS | Status: AC | PRN

## 2014-04-25 ENCOUNTER — Ambulatory Visit: Payer: Medicare Other | Admitting: Internal Medicine

## 2014-09-08 ENCOUNTER — Encounter: Payer: Self-pay | Admitting: *Deleted

## 2014-09-10 ENCOUNTER — Encounter (HOSPITAL_COMMUNITY): Payer: Self-pay

## 2014-09-10 ENCOUNTER — Emergency Department (HOSPITAL_COMMUNITY)
Admission: EM | Admit: 2014-09-10 | Discharge: 2014-09-10 | Disposition: A | Discharge status: Home / Self Care | Payer: Medicare Other | Source: Home / Self Care | Attending: Family Medicine | Admitting: Family Medicine

## 2014-09-10 DIAGNOSIS — H9202 Otalgia, left ear: Secondary | ICD-10-CM | POA: Diagnosis not present

## 2014-09-10 DIAGNOSIS — R0982 Postnasal drip: Secondary | ICD-10-CM

## 2014-09-10 DIAGNOSIS — R42 Dizziness and giddiness: Secondary | ICD-10-CM

## 2014-09-10 MED ORDER — IPRATROPIUM BROMIDE 0.06 % NA SOLN: 2.0000 | Freq: Four times a day (QID) | NASAL | Status: DC

## 2014-09-10 NOTE — ED Notes (Signed)
C/o episodic weakness x couple of weeks. Gets fuzzy vision. His PCP has ordered carotid US

## 2014-09-10 NOTE — Discharge Instructions (Signed)
Thank you for coming in today. Follow up with your doctor.  Go to the ER if you get worse.  Call or go to the emergency room if you get worse, have trouble breathing, have chest pains, or palpitations.     Dizziness Dizziness is a common problem. It is a feeling of unsteadiness or light-headedness. You may feel like you are about to faint. Dizziness can lead to injury if you stumble or fall. A person of any age group can suffer from dizziness, but dizziness is more common in older adults. CAUSES  Dizziness can be caused by many different things, including:  Middle ear problems.  Standing for too long.  Infections.  An allergic reaction.  Aging.  An emotional response to something, such as the sight of blood.  Side effects of medicines.  Tiredness.  Problems with circulation or blood pressure.  Excessive use of alcohol or medicines, or illegal drug use.  Breathing too fast (hyperventilation).  An irregular heart rhythm (arrhythmia).  A low red blood cell count (anemia).  Pregnancy.  Vomiting, diarrhea, fever, or other illnesses that cause body fluid loss (dehydration).  Diseases or conditions such as Parkinson's disease, high blood pressure (hypertension), diabetes, and thyroid problems.  Exposure to extreme heat. DIAGNOSIS  Your health care provider will ask about your symptoms, perform a physical exam, and perform an electrocardiogram (ECG) to record the electrical activity of your heart. Your health care provider may also perform other heart or blood tests to determine the cause of your dizziness. These may include:  Transthoracic echocardiogram (TTE). During echocardiography, sound waves are used to evaluate how blood flows through your heart.  Transesophageal echocardiogram (TEE).  Cardiac monitoring. This allows your health care provider to monitor your heart rate and rhythm in real time.  Holter monitor. This is a portable device that records your heartbeat  and can help diagnose heart arrhythmias. It allows your health care provider to track your heart activity for several days if needed.  Stress tests by exercise or by giving medicine that makes the heart beat faster. TREATMENT  Treatment of dizziness depends on the cause of your symptoms and can vary greatly. HOME CARE INSTRUCTIONS   Drink enough fluids to keep your urine clear or pale yellow. This is especially important in very hot weather. In older adults, it is also important in cold weather.  Take your medicine exactly as directed if your dizziness is caused by medicines. When taking blood pressure medicines, it is especially important to get up slowly.  Rise slowly from chairs and steady yourself until you feel okay.  In the morning, first sit up on the side of the bed. When you feel okay, stand slowly while holding onto something until you know your balance is fine.  Move your legs often if you need to stand in one place for a long time. Tighten and relax your muscles in your legs while standing.  Have someone stay with you for 1-2 days if dizziness continues to be a problem. Do this until you feel you are well enough to stay alone. Have the person call your health care provider if he or she notices changes in you that are concerning.  Do not drive or use heavy machinery if you feel dizzy.  Do not drink alcohol. SEEK IMMEDIATE MEDICAL CARE IF:   Your dizziness or light-headedness gets worse.  You feel nauseous or vomit.  You have problems talking, walking, or using your arms, hands, or legs.  You  feel weak.  You are not thinking clearly or you have trouble forming sentences. It may take a friend or family member to notice this.  You have chest pain, abdominal pain, shortness of breath, or sweating.  Your vision changes.  You notice any bleeding.  You have side effects from medicine that seems to be getting worse rather than better. MAKE SURE YOU:   Understand these  instructions.  Will watch your condition.  Will get help right away if you are not doing well or get worse. Document Released: 10/19/2000 Document Revised: 04/30/2013 Document Reviewed: 11/12/2010 [facility] Patient Information 2015 [place], [place]. This information is not intended to replace advice given to you by your health care provider. Make sure you discuss any questions you have with your health care provider.

## 2014-09-10 NOTE — ED Provider Notes (Signed)
Zachary Padilla is a 81 y.o. male who presents to Urgent Care today for several complaints. Patient is here today complaining of left ear irritation and voice change and nasal congestion. This is been ongoing now for some time. The nasal congestion is associated with a mild cough as well. He notes mild ear discomfort associated with tinnitus. He attributes this to a ruptured tympanic membrane that is currently being evaluated and managed by an ear nose and throat doctor. He uses tobramycin drops intermittently which seems to help. Additionally over the last several weeks he's had episodes where he gets blurry vision versus diplopia. This lasts for short. Timentin is to resolve. His primary care provider has seen him for this problem and is currently working him up. He has a carotid Doppler scheduled in the near future. He denies any weakness or numbness or trouble swallowing or speaking. He is currently asymptomatic.   Past Medical History  Diagnosis Date  . 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 ALLERGIC RHINITIS 02/23/2009  . COLONIC POLYPS, HX OF 02/23/2009  . DIVERTICULITIS, HX OF 02/23/2009  . GERD 02/23/2009  . HYPERTENSION 02/23/2009  . URINARY INCONTINENCE 02/23/2009  . PONV (postoperative nausea and vomiting)   . Thoracic aortic aneurysm   . Hyperlipidemia   . Family history of heart disease   . Pre-syncope    Past Surgical History  Procedure Laterality Date  . Appendectomy  1946  . Sinus surgery  2008  . Removal of pre-cancer bump on neck  2005  . Left ear tube  2001  . Ruptured appendics  1946   History  Substance Use Topics  . Smoking status: Never Smoker   . Smokeless tobacco: Never Used     Comment: Lives with spouse-independant in all ADLs. [employer] and [employer] at [employer], Married in "52"  . Alcohol Use: No   ROS as above Medications: No current facility-administered medications for this encounter.   Current Outpatient Prescriptions  Medication Sig  Dispense Refill  . Azelaic Acid (FINACEA) 15 % cream Apply 1 application topically 2 (two) times daily as needed. For Roscacea    . Bromfenac Sodium (PROLENSA OP) Apply 1 drop to eye daily. Right eye only    . COZAAR 50 MG tablet TAKE ONE TABLET EACH DAY 30 tablet 11  . guaiFENesin (MUCINEX) 600 MG 12 hr tablet Take 600 mg by mouth every 12 (twelve) hours as needed.     Marland Kitchen ipratropium (ATROVENT) 0.06 % nasal spray Place 2 sprays into both nostrils 4 (four) times daily. 15 mL 1  . losartan (COZAAR) 50 MG tablet Take 50 mg by mouth daily.    . meclizine (ANTIVERT) 25 MG tablet Take 1 tablet (25 mg total) by mouth 3 (three) times daily as needed. 30 tablet 0  . meclizine (ANTIVERT) 50 MG tablet Take 1 tablet (50 mg total) by mouth 3 (three) times daily as needed. 30 tablet 0  . ondansetron (ZOFRAN ODT) 4 MG disintegrating tablet Take 1 tablet (4 mg total) by mouth every 8 (eight) hours as needed for nausea. 20 tablet 0  . ondansetron (ZOFRAN ODT) 4 MG disintegrating tablet Take 1 tablet (4 mg total) by mouth every 8 (eight) hours as needed for nausea. 20 tablet 0  . sodium chloride (OCEAN) 0.65 % SOLN nasal spray Place 1-2 sprays into both nostrils at bedtime.     Allergies  Allergen Reactions  . Ace Inhibitors   . Ciprodex [Ciprofloxacin-Dexamethasone]   . Erythromycin Nausea And Vomiting  .  Levofloxacin Other (See Comments)    dehydration  . Losartan Potassium   . Penicillins Nausea And Vomiting  . Sulfonamide Derivatives Nausea And Vomiting     Exam:  BP 153/80 mmHg  Pulse 66  Temp(Src) 97 F (36.1 C) (Oral)  Resp 17  SpO2 97% Gen: Well NAD HEENT: EOMI,  MMM posterior pharynx with cobblestoning normal right panic membrane left with perforation without drainage. Lungs: Normal work of breathing. CTABL Heart: RRR no MRG Abd: NABS, Soft. Nondistended, Nontender Exts: Brisk capillary refill, warm and well perfused.  Neuro: Alert and oriented normal coordination balance sensation and  gait. Normal speech.  ED ECG REPORT   Date: 09/10/2014  Rate: 59  Rhythm: sinus bradycardia  QRS Axis: left  Intervals: normal  ST/T Wave abnormalities: nonspecific T wave changes  Conduction Disutrbances:right bundle branch block  Narrative Interpretation:   Old EKG Reviewed: unchanged and From April 2015  I have personally reviewed the EKG tracing and agree with the computerized printout as noted.   No results found for this or any previous visit (from the past 24 hour(s)). No results found.  Assessment and Plan: 81 y.o. male with  1) postnasal drip treat with Atrovent nasal spray 2) ear complaint: Continue tobramycin follow-up with ear nose and throat 3) neuro: Follow-up with PCP. Possible TIAs. Patient does not want to go to the emergency room today and prefers workup. His PCP.  Discussed warning signs or symptoms. Please see discharge instructions. Patient expresses understanding.     Gregor Hams, MD 09/10/14 [phone]

## 2014-09-11 ENCOUNTER — Other Ambulatory Visit: Payer: Self-pay | Admitting: Family Medicine

## 2014-09-11 DIAGNOSIS — R410 Disorientation, unspecified: Secondary | ICD-10-CM

## 2014-09-11 DIAGNOSIS — R4189 Other symptoms and signs involving cognitive functions and awareness: Secondary | ICD-10-CM

## 2014-09-11 DIAGNOSIS — R4689 Other symptoms and signs involving appearance and behavior: Secondary | ICD-10-CM

## 2014-09-12 ENCOUNTER — Ambulatory Visit
Admission: RE | Admit: 2014-09-12 | Discharge: 2014-09-12 | Disposition: A | Discharge status: Home / Self Care | Payer: Medicare Other | Source: Ambulatory Visit | Attending: Family Medicine | Admitting: Family Medicine

## 2014-09-12 DIAGNOSIS — R410 Disorientation, unspecified: Secondary | ICD-10-CM

## 2014-09-12 DIAGNOSIS — R4189 Other symptoms and signs involving cognitive functions and awareness: Secondary | ICD-10-CM

## 2014-09-12 DIAGNOSIS — R4689 Other symptoms and signs involving appearance and behavior: Secondary | ICD-10-CM

## 2014-09-22 ENCOUNTER — Other Ambulatory Visit: Payer: Self-pay | Admitting: *Deleted

## 2014-09-22 MED ORDER — LOSARTAN POTASSIUM 50 MG PO TABS: 50.0000 mg | ORAL_TABLET | Freq: Every day | ORAL | Status: DC

## 2014-10-02 ENCOUNTER — Encounter: Payer: Self-pay | Admitting: Cardiovascular Disease

## 2014-10-19 ENCOUNTER — Encounter (HOSPITAL_COMMUNITY): Payer: Self-pay | Admitting: Emergency Medicine

## 2014-10-19 ENCOUNTER — Emergency Department (HOSPITAL_COMMUNITY): Payer: Medicare Other

## 2014-10-19 ENCOUNTER — Emergency Department (HOSPITAL_COMMUNITY)
Admission: EM | Admit: 2014-10-19 | Discharge: 2014-10-19 | Disposition: A | Discharge status: Home / Self Care | Payer: Medicare Other | Attending: Emergency Medicine | Admitting: Emergency Medicine

## 2014-10-19 DIAGNOSIS — R569 Unspecified convulsions: Secondary | ICD-10-CM | POA: Diagnosis not present

## 2014-10-19 DIAGNOSIS — Z8639 Personal history of other endocrine, nutritional and metabolic disease: Secondary | ICD-10-CM | POA: Insufficient documentation

## 2014-10-19 DIAGNOSIS — Z86010 Personal history of colonic polyps: Secondary | ICD-10-CM | POA: Diagnosis not present

## 2014-10-19 DIAGNOSIS — R0602 Shortness of breath: Secondary | ICD-10-CM | POA: Diagnosis not present

## 2014-10-19 DIAGNOSIS — R011 Cardiac murmur, unspecified: Secondary | ICD-10-CM | POA: Insufficient documentation

## 2014-10-19 DIAGNOSIS — Z8709 Personal history of other diseases of the respiratory system: Secondary | ICD-10-CM | POA: Insufficient documentation

## 2014-10-19 DIAGNOSIS — I10 Essential (primary) hypertension: Secondary | ICD-10-CM | POA: Insufficient documentation

## 2014-10-19 DIAGNOSIS — Z7982 Long term (current) use of aspirin: Secondary | ICD-10-CM | POA: Diagnosis not present

## 2014-10-19 DIAGNOSIS — R4182 Altered mental status, unspecified: Secondary | ICD-10-CM | POA: Diagnosis present

## 2014-10-19 DIAGNOSIS — Z8719 Personal history of other diseases of the digestive system: Secondary | ICD-10-CM | POA: Insufficient documentation

## 2014-10-19 DIAGNOSIS — Z880 Allergy status to penicillin: Secondary | ICD-10-CM | POA: Diagnosis not present

## 2014-10-19 DIAGNOSIS — Z872 Personal history of diseases of the skin and subcutaneous tissue: Secondary | ICD-10-CM | POA: Insufficient documentation

## 2014-10-19 DIAGNOSIS — Z79899 Other long term (current) drug therapy: Secondary | ICD-10-CM | POA: Diagnosis not present

## 2014-10-19 LAB — COMPREHENSIVE METABOLIC PANEL
ALK PHOS: 77 U/L (ref 38–126)
ALT: 28 U/L (ref 17–63)
AST: 8 U/L — ABNORMAL LOW (ref 15–41)
Albumin: 3.4 g/dL — ABNORMAL LOW (ref 3.5–5.0)
Anion gap: 8 (ref 5–15)
BUN: 16 mg/dL (ref 6–20)
CO2: 23 mmol/L (ref 22–32)
Calcium: 7.9 mg/dL — ABNORMAL LOW (ref 8.9–10.3)
Chloride: 99 mmol/L — ABNORMAL LOW (ref 101–111)
Creatinine, Ser: 1.34 mg/dL — ABNORMAL HIGH (ref 0.61–1.24)
GFR calc Af Amer: 55 mL/min — ABNORMAL LOW (ref >60)
GFR, EST NON AFRICAN AMERICAN: 47 mL/min — AB (ref >60)
Glucose, Bld: 144 mg/dL — ABNORMAL HIGH (ref 65–99)
Potassium: 4.7 mmol/L (ref 3.5–5.1)
Sodium: 130 mmol/L — ABNORMAL LOW (ref 135–145)
Total Bilirubin: 0.8 mg/dL (ref 0.3–1.2)
Total Protein: 6.4 g/dL — ABNORMAL LOW (ref 6.5–8.1)

## 2014-10-19 LAB — URINALYSIS, ROUTINE W REFLEX MICROSCOPIC
Bilirubin Urine: NEGATIVE
Glucose, UA: NEGATIVE mg/dL
Ketones, ur: NEGATIVE mg/dL
Leukocytes, UA: NEGATIVE
Nitrite: NEGATIVE
Protein, ur: NEGATIVE mg/dL
Specific Gravity, Urine: 1.011 (ref 1.005–1.030)
Urobilinogen, UA: 0.2 mg/dL (ref 0.0–1.0)
pH: 6 (ref 5.0–8.0)

## 2014-10-19 LAB — CBC
HCT: 38.4 % — ABNORMAL LOW (ref 39.0–52.0)
Hemoglobin: 13.5 g/dL (ref 13.0–17.0)
MCH: 32.6 pg (ref 26.0–34.0)
MCHC: 35.2 g/dL (ref 30.0–36.0)
MCV: 92.8 fL (ref 78.0–100.0)
Platelets: 176 10*3/uL (ref 150–400)
RBC: 4.14 MIL/uL — ABNORMAL LOW (ref 4.22–5.81)
RDW: 12.3 % (ref 11.5–15.5)
WBC: 6.7 10*3/uL (ref 4.0–10.5)

## 2014-10-19 LAB — URINE MICROSCOPIC-ADD ON

## 2014-10-19 LAB — I-STAT CG4 LACTIC ACID, ED: LACTIC ACID, VENOUS: 2.22 mmol/L — AB (ref 0.5–2.0)

## 2014-10-19 LAB — CBG MONITORING, ED: GLUCOSE-CAPILLARY: 116 mg/dL — AB (ref 65–99)

## 2014-10-19 NOTE — ED Notes (Signed)
Patient transported to CT 

## 2014-10-19 NOTE — ED Notes (Signed)
MD at bedside. 

## 2014-10-19 NOTE — ED Provider Notes (Signed)
CSN: [id number]     Arrival date & time 10/19/14  3568 History   None    This chart was scribed for [name], MD by Forrestine Him, ED Scribe. This patient was seen in room B18C/B18C and the patient's care was started 4:32 AM.   Chief Complaint  Patient presents with  . Altered Mental Status   The history is provided by the patient and the spouse. No language interpreter was used.    HPI Comments: Zachary Padilla brought in by EMS is a 81 y.o. male with a PMHx of HTN and hyperlipidemia who presents to the Emergency Department here for altered mental status this morning. Wife states she woke form sleep after pt was making a "funny noise like he could not breathe". States she then noted blood on his pillow, however, she denies noticing any lacerations to the head. Wife reports noticing shaking and diaphoresis from pt lasting several minutes in duration. Pt was unresponsive until EMS arrival. In addition, prior to arrival to department, pt was incontinent. Last known normal prior to going to bed yesterday evening. He denies any history of stroke or MI. No history of seizures. He denies any history of infection.  Past Medical History  Diagnosis Date  . 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 ALLERGIC RHINITIS 02/23/2009  . COLONIC POLYPS, HX OF 02/23/2009  . DIVERTICULITIS, HX OF 02/23/2009  . GERD 02/23/2009  . HYPERTENSION 02/23/2009  . URINARY INCONTINENCE 02/23/2009  . PONV (postoperative nausea and vomiting)   . Thoracic aortic aneurysm   . Hyperlipidemia   . Family history of heart disease   . Pre-syncope    Past Surgical History  Procedure Laterality Date  . Appendectomy  1946  . Sinus surgery  2008  . Removal of pre-cancer bump on neck  2005  . Left ear tube  2001  . Ruptured appendics  1946   Family History  Problem Relation Age of Onset  . Heart disease Other   . Stroke Other    History  Substance Use Topics  . Smoking status: Never Smoker   . Smokeless tobacco: Never Used      Comment: Lives with spouse-independant in all ADLs. [employer] and [employer] at [employer], Married in "52"  . Alcohol Use: No    Review of Systems  Constitutional: Negative for fever and chills.  Respiratory: Positive for shortness of breath.   Cardiovascular: Negative for chest pain.  Gastrointestinal: Negative for nausea and vomiting.  Musculoskeletal: Negative for arthralgias.  Skin: Negative for wound.  Neurological: Negative for weakness, numbness and headaches.  Psychiatric/Behavioral: Negative for confusion.  All other systems reviewed and are negative.     Allergies  Ace inhibitors; Ciprodex; Erythromycin; Levofloxacin; Losartan potassium; Penicillins; and Sulfonamide derivatives  Home Medications   Prior to Admission medications   Medication Sig Start Date End Date Taking? Authorizing Provider  aspirin EC 81 MG tablet Take 81 mg by mouth daily.   Yes Historical Provider, MD  Azelaic Acid (FINACEA) 15 % cream Apply 1 application topically 2 (two) times daily as needed. For Roscacea   Yes Historical Provider, MD  COZAAR 50 MG tablet TAKE ONE TABLET EACH DAY 01/23/14  Yes Lorretta Harp, MD  guaiFENesin (MUCINEX) 600 MG 12 hr tablet Take 600 mg by mouth every 12 (twelve) hours as needed for cough or to loosen phlegm.    Yes Historical Provider, MD  ipratropium (ATROVENT) 0.06 % nasal spray Place 2 sprays into both nostrils 4 (four)  times daily. 09/10/14  Yes Gregor Hams, MD  meclizine (ANTIVERT) 25 MG tablet Take 1 tablet (25 mg total) by mouth 3 (three) times daily as needed. Patient taking differently: Take 25 mg by mouth 3 (three) times daily as needed for dizziness or nausea.  03/14/14  Yes Tanna Furry, MD  ondansetron (ZOFRAN ODT) 4 MG disintegrating tablet Take 1 tablet (4 mg total) by mouth every 8 (eight) hours as needed for nausea. 03/14/14  Yes Tanna Furry, MD  sodium chloride (OCEAN) 0.65 % SOLN nasal spray Place 1-2 sprays into both nostrils at  bedtime.   Yes Historical Provider, MD  Bromfenac Sodium (PROLENSA OP) Apply 1 drop to eye daily. Right eye only    Historical Provider, MD   Triage Vitals: BP 132/73 mmHg  Pulse 65  Temp(Src) 97.7 F (36.5 C) (Oral)  Resp 11  Ht 5\' 10"  (1.778 m)  Wt 165 lb (74.844 kg)  BMI 23.68 kg/m2  SpO2 97%   Physical Exam  Constitutional: He is oriented to person, place, and time. He appears well-developed and well-nourished.  HENT:  Head: Normocephalic.  No signs of tongue lacerations No oral bleeding   Eyes: EOM are normal.  Pupils are 3 mm, equal, and active to light No nystagmus   Neck: Normal range of motion.  Cardiovascular: Normal rate and regular rhythm.   Murmur heard. Pulmonary/Chest: Effort normal and breath sounds normal.  Abdominal: He exhibits no distension.  Musculoskeletal: Normal range of motion.  Neurological: He is alert and oriented to person, place, and time.  Upper and lower extremities strength 5/5 Cranial nerves 2-12 intact Upper and lower extremity sensory grossly intact  Psychiatric: He has a normal mood and affect.  Nursing note and vitals reviewed.   ED Course  Procedures (including critical care time)  DIAGNOSTIC STUDIES: Oxygen Saturation is 100% on RA, Normal by my interpretation.    COORDINATION OF CARE: 4:32 AM- Will order CBG, EKG, CBC, CMP, i-stat CG4 lactic acid, and urinalysis. Discussed treatment plan with pt at bedside and pt agreed to plan.     Labs Review Labs Reviewed  CBC - Abnormal; Notable for the following:    RBC 4.14 (*)    HCT 38.4 (*)    All other components within normal limits  COMPREHENSIVE METABOLIC PANEL - Abnormal; Notable for the following:    Sodium 130 (*)    Chloride 99 (*)    Glucose, Bld 144 (*)    Creatinine, Ser 1.34 (*)    Calcium 7.9 (*)    Total Protein 6.4 (*)    Albumin 3.4 (*)    AST 8 (*)    GFR calc non Af Amer 47 (*)    GFR calc Af Amer 55 (*)    All other components within normal limits   URINALYSIS, ROUTINE W REFLEX MICROSCOPIC (NOT AT [facility]) - Abnormal; Notable for the following:    Hgb urine dipstick TRACE (*)    All other components within normal limits  CBG MONITORING, ED - Abnormal; Notable for the following:    Glucose-Capillary 116 (*)    All other components within normal limits  I-STAT CG4 LACTIC ACID, ED - Abnormal; Notable for the following:    Lactic Acid, Venous 2.22 (*)    All other components within normal limits  URINE CULTURE  URINE MICROSCOPIC-ADD ON  I-STAT CG4 LACTIC ACID, ED    Imaging Review Ct Head Wo Contrast  10/19/2014   CLINICAL DATA:  Altered mental status,  noted to have blood on pillow. Shaking, diaphoresis, unresponsive. Evaluate seizure like activity. History of hypertension, hyperlipidemia, presyncope.  EXAM: CT HEAD WITHOUT CONTRAST  TECHNIQUE: Contiguous axial images were obtained from the base of the skull through the vertex without intravenous contrast.  COMPARISON:  CT head March 14, 2014  FINDINGS: The ventricles and sulci are normal for age. No intraparenchymal hemorrhage, mass effect nor midline shift. Patchy supratentorial Dornfeld matter hypodensities are within normal range for patient's age and though non-specific suggest sequelae of chronic small vessel ischemic disease. No acute large vascular territory infarcts. 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  No abnormal extra-axial fluid collections. Basal cisterns are patent. Moderate calcific atherosclerosis of the carotid siphons and included vertebral arteries.  No skull fracture. The included ocular globes and orbital contents are non-suspicious. Bilateral ocular lens implants, mucosal thickening consistent with chronic sinusitis, status post antrectomies and partial ethmoidectomies. No paranasal sinus air-fluid levels. Under pneumatized LEFT mastoid air cells. Moderate temporomandibular osteoarthrosis.  IMPRESSION: No acute intracranial process.  Involutional  changes. Stable moderate Kerrigan matter changes compatible chronic small vessel ischemic disease.  Chronic paranasal sinusitis.   Electronically Signed   By: Elon Alas M.D.   On: 10/19/2014 05:46   Mr Brain Wo Contrast  10/19/2014   CLINICAL DATA:  Acute seizure this morning.  EXAM: MRI HEAD WITHOUT CONTRAST  TECHNIQUE: Multiplanar, multiecho pulse sequences of the brain and surrounding structures were obtained without intravenous contrast.  COMPARISON:  Head CT earlier same day.  FINDINGS: Diffusion imaging does not show any acute or subacute infarction. There chronic small-vessel ischemic changes throughout the brainstem. There are old small vessel cerebellar infarctions. The cerebral hemispheres show moderate to marked chronic small-vessel ischemic changes affecting the deep and subcortical Geno matter. There are a few old lacunar infarctions within the basal ganglia. No evidence of mass lesion, hemorrhage, hydrocephalus or extra-axial collection. Mesial temporal lobes are symmetric and unremarkable. No pituitary mass. No skull or skullbase lesion. Previous sinus surgery without evidence of ongoing inflammation.  IMPRESSION: No acute finding. No specific cause of seizure is identified. Atrophy with chronic small-vessel ischemic changes throughout the brain as outlined above.   Electronically Signed   By: Nelson Chimes M.D.   On: 10/19/2014 07:20     EKG Interpretation   Date/Time:  Sunday October 19 2014 03:50:59 EDT Ventricular Rate:  67 PR Interval:  249 QRS Duration: 150 QT Interval:  406 QTC Calculation: 429 R Axis:   -18 Text Interpretation:  Sinus rhythm Prolonged PR interval Right bundle  branch block Inferior infarct, old Lateral leads are also involved No  significant change since last tracing Confirmed by Kathrynn Humble, MD, Thelma Comp  [phone]) on 10/19/2014 4:26:22 AM      MDM   Final diagnoses:  Seizure-like activity    I personally performed the services described in this  documentation, which was scribed in my presence. The recorded information has been reviewed and is accurate.  Pt comes in with seizure like activity. He is aox3 with no neuro deficits currently. No headaches, infection like foci. Pt went to bed completely normal. Blood by mouth, incontinence and confusion which appears to be post ictal phase - all point towards possible seizure. Spoke w/ Dr. Armida Sans - who thinks MR w/o is a good idea to ensure there is no lesions missed on CT. MR is neg - will d/c. Strict return precautions discussed.    [name], MD 10/19/14 [phone]

## 2014-10-19 NOTE — ED Notes (Signed)
Per EMS- called to patients home by wife who was awakened by husband "making a funny sound" and "I attempted to wake him but he wouldn't wake up"; EMS noted that there was blood under the patients head but no laceration noted externally and pt became incontinent; EMS reports initial GCS of 10 now improved to 12 and now less combative

## 2014-10-19 NOTE — ED Notes (Signed)
Pt has returned from being out of the department; pt placed back on monitor, continuous pulse oximetry and blood pressure cuff; visitors at bedside

## 2014-10-19 NOTE — ED Notes (Signed)
Dr. Kathrynn Humble explaining results to family and patient in detail, importance to follow-up with Neurology.

## 2014-10-19 NOTE — ED Notes (Signed)
Dr Kathrynn Humble given a copy of lactic acid results 2.22

## 2014-10-19 NOTE — Discharge Instructions (Signed)
It appears that you had some seizure like activity. MRI of the brain was recommended by our neurologist, and is normal.  WE RECOMMEND THAT YOU SEE THE OUTPATIENT NEUROLOGIST FOR FURTHER EVALUATION. RETURN TO THE ER IF THERE IS REPEAT EPISODE.   Seizure, Adult A seizure is abnormal electrical activity in the brain. Seizures usually last from 30 seconds to 2 minutes. There are various types of seizures. Before a seizure, you may have a warning sensation (aura) that a seizure is about to occur. An aura may include the following symptoms:   Fear or anxiety.  Nausea.  Feeling like the room is spinning (vertigo).  Vision changes, such as seeing flashing lights or spots. Common symptoms during a seizure include:  A change in attention or behavior (altered mental status).  Convulsions with rhythmic jerking movements.  Drooling.  Rapid eye movements.  Grunting.  Loss of bladder and bowel control.  Bitter taste in the mouth.  Tongue biting. After a seizure, you may feel confused and sleepy. You may also have an injury resulting from convulsions during the seizure. HOME CARE INSTRUCTIONS   If you are given medicines, take them exactly as prescribed by your health care provider.  Keep all follow-up appointments as directed by your health care provider.  Do not swim or drive or engage in risky activity during which a seizure could cause further injury to you or others until your health care provider says it is OK.  Get adequate rest.  Teach friends and family what to do if you have a seizure. They should:  Lay you on the ground to prevent a fall.  Put a cushion under your head.  Loosen any tight clothing around your neck.  Turn you on your side. If vomiting occurs, this helps keep your airway clear.  Stay with you until you recover.  Know whether or not you need emergency care. SEEK IMMEDIATE MEDICAL CARE IF:  The seizure lasts longer than 5 minutes.  The seizure is  severe or you do not wake up immediately after the seizure.  You have an altered mental status after the seizure.  You are having more frequent or worsening seizures. Someone should drive you to the emergency department or call local emergency services (911 in [place] MAKE SURE YOU:  Understand these instructions.  Will watch your condition.  Will get help right away if you are not doing well or get worse. Document Released: 04/22/2000 Document Revised: 02/13/2013 Document Reviewed: 12/05/2012 [facility] Patient Information 2015 [place], [place]. This information is not intended to replace advice given to you by your health care provider. Make sure you discuss any questions you have with your health care provider.  Driving and Equipment Restrictions Some medical problems make it dangerous to drive, ride a bike, or use machines. Some of these problems are:  A hard blow to the head (concussion).  Passing out (fainting).  Twitching and shaking (seizures).  Low blood sugar.  Taking medicine to help you relax (sedatives).  Taking pain medicines.  Wearing an eye patch.  Wearing splints. This can make it hard to use parts of your body that you need to drive safely. HOME CARE   Do not drive until your doctor says it is okay.  Do not use machines until your doctor says it is okay. You may need a form signed by your doctor (medical release) before you can drive again. You may also need this form before you do other tasks where you need to be  fully alert. MAKE SURE YOU:  Understand these instructions.  Will watch your condition.  Will get help right away if you are not doing well or get worse. Document Released: 06/02/2004 Document Revised: 07/18/2011 Document Reviewed: 09/02/2009 [facility] Patient Information 2015 [place], [place]. This information is not intended to replace advice given to you by your health care provider. Make sure you discuss any questions you have with your health  care provider.

## 2014-10-19 NOTE — ED Notes (Signed)
Wheelchair to bedside; pt is getting discharged home; visitors at bedside

## 2014-10-20 LAB — URINE CULTURE
COLONY COUNT: NO GROWTH
Culture: NO GROWTH

## 2014-10-21 ENCOUNTER — Telehealth: Payer: Self-pay | Admitting: Cardiovascular Disease

## 2014-10-21 NOTE — Telephone Encounter (Signed)
Pt said he was supposed to have had a CT Scan of his chest in April. Pt says he have not heard anything.

## 2014-10-24 ENCOUNTER — Encounter: Payer: Self-pay | Admitting: Neurology

## 2014-10-24 ENCOUNTER — Telehealth: Payer: Self-pay | Admitting: *Deleted

## 2014-10-24 ENCOUNTER — Ambulatory Visit (INDEPENDENT_AMBULATORY_CARE_PROVIDER_SITE_OTHER): Payer: Medicare Other | Admitting: Neurology

## 2014-10-24 VITALS — BP 128/75 | HR 67 | Ht 69.0 in | Wt 156.0 lb

## 2014-10-24 DIAGNOSIS — R569 Unspecified convulsions: Secondary | ICD-10-CM

## 2014-10-24 HISTORY — DX: Unspecified convulsions: R56.9

## 2014-10-24 NOTE — Patient Instructions (Signed)

## 2014-10-24 NOTE — Progress Notes (Signed)
Reason for visit: Seizure  Referring physician:   [name] is a 81 y.o. male  History of present illness:  Zachary Padilla is an 81 year old right-handed [name] with a history of a probable seizure event that occurred on 10/19/2014. The patient was sleeping when his wife was awakened by a high-pitched noise coming from her husband. When she looked over, she could not awaken him, and she noted that there was blood around his mouth. She called 911, and he went to the hospital. The patient woke up in the ambulance on the way to the hospital. He denied any urinary incontinence, but this was noted by the ER staff. The patient was quite diaphoretic. The patient denies any previous such episodes, but he did have an episode of vertigo in November 2015. This was associated with nausea and vomiting, no altered mental status. The patient underwent MRI evaluation of the brain in the emergency room which shows a moderate level of small vessel ischemic changes. No acute changes were noted. The blood work revealed a sodium level of 130. The patient indicates that he drinks 4 or 5 twelve ounce bottles of water daily, along with juice and occasional ice tea. The patient is not on a diuretic medication. The patient did bite his tongue with the event. He comes to this office for an evaluation. He denies any focal numbness or weakness on the face, arms, or legs. He has not had any memory problems, or balance issues. He denies any troubles controlling the bowels or the bladder.  Past Medical History  Diagnosis Date  . 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 ALLERGIC RHINITIS 02/23/2009  . COLONIC POLYPS, HX OF 02/23/2009  . DIVERTICULITIS, HX OF 02/23/2009  . GERD 02/23/2009  . HYPERTENSION 02/23/2009  . URINARY INCONTINENCE 02/23/2009  . PONV (postoperative nausea and vomiting)   . Thoracic aortic aneurysm   . Hyperlipidemia   . Family history of heart disease   . Pre-syncope   . Convulsions/seizures 10/24/2014      Past Surgical History  Procedure Laterality Date  . Appendectomy  1946  . Sinus surgery  2008  . Removal of pre-cancer bump on neck  2005  . Left ear tube  2001  . Ruptured appendics  1946  . Cataract extraction Bilateral     Family History  Problem Relation Age of Onset  . Heart disease Other   . Stroke Other   . Heart disease Mother   . Stroke Father   . Heart disease Father   . Cancer Sister   . Cancer Brother     Social history:  reports that he has quit smoking. He has never used smokeless tobacco. He reports that he does not drink alcohol or use illicit drugs.  Medications:  Prior to Admission medications   Medication Sig Start Date End Date Taking? Authorizing Provider  aspirin EC 81 MG tablet Take 81 mg by mouth daily.   Yes Historical Provider, MD  Azelaic Acid (FINACEA) 15 % cream Apply 1 application topically 2 (two) times daily as needed. For Roscacea   Yes Historical Provider, MD  Bromfenac Sodium (PROLENSA OP) Apply 1 drop to eye daily. Right eye only   Yes Historical Provider, MD  COZAAR 50 MG tablet TAKE ONE TABLET EACH DAY 01/23/14  Yes Lorretta Harp, MD  guaiFENesin (MUCINEX) 600 MG 12 hr tablet Take 600 mg by mouth every 12 (twelve) hours as needed for cough or to loosen phlegm.  Yes Historical Provider, MD  Probiotic Product (ALIGN PO) Take 1 tablet by mouth daily.   Yes Historical Provider, MD  sodium chloride (OCEAN) 0.65 % SOLN nasal spray Place 1-2 sprays into both nostrils at bedtime.   Yes Historical Provider, MD  tobramycin (TOBREX) 0.3 % ophthalmic solution 2 drops every 8 (eight) hours.   Yes Historical Provider, MD      Allergies  Allergen Reactions  . Ace Inhibitors Other (See Comments)    unknown  . Ciprodex [Ciprofloxacin-Dexamethasone] Other (See Comments)    unknown  . Erythromycin Nausea And Vomiting  . Levofloxacin Other (See Comments)    dehydration  . Losartan Potassium   . Penicillins Nausea And Vomiting  . Sulfonamide  Derivatives Nausea And Vomiting    ROS:  Out of a complete 14 system review of symptoms, the patient complains only of the following symptoms, and all other reviewed systems are negative.  Hearing loss, ringing in the ears Shortness of breath Constipation Feeling hot Allergies Dizziness Not enough sleep  Blood pressure 128/75, pulse 67, height 5\' 9"  (1.753 m), weight 156 lb (70.761 kg).  Physical Exam  General: The patient is alert and cooperative at the time of the examination.  Eyes: Pupils are equal, round, and reactive to light. Discs are flat bilaterally.  Neck: The neck is supple, no carotid bruits are noted.  Respiratory: The respiratory examination is clear.  Cardiovascular: The cardiovascular examination reveals a regular rate and rhythm, no obvious murmurs or rubs are noted.  Skin: Extremities are without significant edema.  Neurologic Exam  Mental status: The patient is alert and oriented x 3 at the time of the examination. The patient has apparent normal recent and remote memory, with an apparently normal attention span and concentration ability.  Cranial nerves: Facial symmetry is present. There is good sensation of the face to pinprick and soft touch bilaterally. The strength of the facial muscles and the muscles to head turning and shoulder shrug are normal bilaterally. Speech is well enunciated, no aphasia or dysarthria is noted. Extraocular movements are full. Visual fields are full. The tongue is midline, and the patient has symmetric elevation of the soft palate. No obvious hearing deficits are noted.  Motor: The motor testing reveals 5 over 5 strength of all 4 extremities. Good symmetric motor tone is noted throughout.  Sensory: Sensory testing is intact to pinprick, soft touch, vibration sensation, and position sense on all 4 extremities. No evidence of extinction is noted.  Coordination: Cerebellar testing reveals good finger-nose-finger and heel-to-shin  bilaterally.  Gait and station: Gait is normal. Tandem gait is normal. Romberg is negative. No drift is seen.  Reflexes: Deep tendon reflexes are symmetric and normal bilaterally. Toes are downgoing bilaterally.   MRI brain 10/19/14:  IMPRESSION: No acute finding. No specific cause of seizure is identified. Atrophy with chronic small-vessel ischemic changes throughout the brain as outlined above.  * MRI scan images were reviewed online. I agree with the written report.    Assessment/Plan:  1. Seizure event  2. Hyponatremia  The patient likely had a generalized seizure event out of sleep. The patient has been noted to have a low sodium level of around 130. The patient is getting a lot of fluid during the day, I have asked him to drink only 2 twelve ounce bottles of water daily. If the sodium level can be increased, this may protect him from further seizures. The patient will undergo an EEG study. I have asked him not  to operate a motor vehicle for 6 months from the date of the last event. I will not initiate anticonvulsive medications at this time unless the EEG study is abnormal. He will follow-up in 3 months, we will check blood work again at that time.  Jill Alexanders MD 10/27/2014 9:25 PM  [facility] [address] [place], [place] [address]  Phone [phone] Fax [phone]

## 2014-10-24 NOTE — Telephone Encounter (Signed)
Prior authorization for brand cozaar complete

## 2014-10-27 ENCOUNTER — Telehealth: Payer: Self-pay | Admitting: Neurology

## 2014-10-27 ENCOUNTER — Ambulatory Visit (INDEPENDENT_AMBULATORY_CARE_PROVIDER_SITE_OTHER): Payer: Medicare Other | Admitting: Neurology

## 2014-10-27 DIAGNOSIS — R569 Unspecified convulsions: Secondary | ICD-10-CM | POA: Diagnosis not present

## 2014-10-27 NOTE — Telephone Encounter (Signed)
I called the patient. The EEG was normal. We will have him refrain from driving for 6 months.

## 2014-10-27 NOTE — Procedures (Signed)
   HISTORY: 81 year old male, with possible generalized seizure coming out of sleep  TECHNIQUE:  16 channel EEG was performed based on standard 10-16 international system. One channel was dedicated to EKG, which has demonstrates normal sinus rhythm of 60 beats per minutes.  Upon awakening, the posterior background activity was well-developed, in alpha range, 8-9 Hz, with amplitude of 20 microvoltage, reactive to eye opening and closure.  There was no evidence of epileptiform discharge.  Photic stimulation was not performed due to malfunction of the photic stimuli.  Hyperventilation was performed, there was no abnormality elicit.  No sleep was achieved.  CONCLUSION: This is a  normal awake EEG.  There is no electrodiagnostic evidence of epileptiform discharge

## 2014-10-30 ENCOUNTER — Telehealth: Payer: Self-pay | Admitting: Cardiovascular Disease

## 2014-10-30 NOTE — Telephone Encounter (Signed)
Yes, he is overdue for a CT.  The order was placed 01/2014 and was due 08/2014.  I will send a message to our schedulers to schedule. Patient aware.

## 2014-10-30 NOTE — Telephone Encounter (Signed)
Zachary Padilla is calling because he wants to know if he is suppose to have a CT angio. Please call .Marland Kitchen

## 2014-10-31 ENCOUNTER — Telehealth: Payer: Self-pay | Admitting: Cardiovascular Disease

## 2014-10-31 NOTE — Telephone Encounter (Signed)
Pt is suppose to have a CT Angiogram next week 11-05-14. Please call pt,he is very concerned because of some health issues he have been experiencing.

## 2014-10-31 NOTE — Telephone Encounter (Signed)
Returned call to patient he stated he wanted Dr.Berry to know he had a seizure on 10/19/14.Stated he was taken to [facility] ER.EEG normal.Sodium low.Stated no seizures since.Stated he wanted to make sure this would not cause a problem with chest ct angio scheduled 11/05/14.Advised this will not cause a problem.Will send message to Dr.Berry.

## 2014-11-05 ENCOUNTER — Other Ambulatory Visit: Payer: Medicare Other

## 2014-11-05 ENCOUNTER — Telehealth: Payer: Self-pay | Admitting: Cardiovascular Disease

## 2014-11-05 NOTE — Telephone Encounter (Signed)
Mr.Duffett isi calling because he is still not sure with the problems he has had should he have this test on 11/06/14. Please call

## 2014-11-05 NOTE — Telephone Encounter (Signed)
Marzetta Board called referring to a telephone call from last week (10/31/14), regarding patient having a seizure. Triage nurse at that time told patient that it would be fine for him to proceed with CT Angio (scheduled for today). Marzetta Board stated that patient had not heard anything from our office. Phone message from 6/24 had not been addressed by Dr Gwenlyn Found himself. Referred to DOD, Dr Debara Pickett to make sure it would be okay to proceed with CT Angio.

## 2014-11-06 ENCOUNTER — Ambulatory Visit (INDEPENDENT_AMBULATORY_CARE_PROVIDER_SITE_OTHER)
Admission: RE | Admit: 2014-11-06 | Discharge: 2014-11-06 | Disposition: A | Discharge status: Home / Self Care | Payer: Medicare Other | Source: Ambulatory Visit | Attending: Cardiovascular Disease | Admitting: Cardiovascular Disease

## 2014-11-06 DIAGNOSIS — E785 Hyperlipidemia, unspecified: Secondary | ICD-10-CM

## 2014-11-06 DIAGNOSIS — I712 Thoracic aortic aneurysm, without rupture, unspecified: Secondary | ICD-10-CM

## 2014-11-06 MED ORDER — IOHEXOL 350 MG/ML SOLN
100.0000 mL | Freq: Once | INTRAVENOUS | Status: AC | PRN
  Administered 2014-11-06: 100 mL via INTRAVENOUS

## 2014-11-07 ENCOUNTER — Telehealth: Payer: Self-pay | Admitting: Neurology

## 2014-11-07 NOTE — Telephone Encounter (Signed)
Pt calling because it was suggested that he not drive, not an order. He has other appts to make and wants to know if he is allowed to drive please call and advise [phone]

## 2014-11-07 NOTE — Telephone Encounter (Signed)
I called the patient. My official recommendation again would be not to drive for 6 months. This is for safety issues, and this is the official recommendation by the [employer] of [employer]. I will not call DMV have his license pulled, but I cannot change my recommendation officially.

## 2014-11-07 NOTE — Telephone Encounter (Signed)
I called the patient. I explained to him that because he was thought to have had a seizure, he is asked to refrain from driving for 6 months. He stated that he didn't understand why he could not drive to his doctor appointments since all we really found was that he had low sodium and the EEG was normal. I explained that Dr. Jannifer Franklin' note after the EEG stated he still would like the patient to refrain from driving for 6 months. The patient stated he is not taking any medications for seizures and felt like he should be able to drive on a limited basis. He is very frustrated. He stated that he has used up his resources for going places. I suggested public transportation and offered to give him information on it, but he stated he really felt like he should be able to drive. I told him I would have Dr. Jannifer Franklin to call him back to discuss this.

## 2014-11-11 ENCOUNTER — Telehealth: Payer: Self-pay | Admitting: Cardiovascular Disease

## 2014-11-11 DIAGNOSIS — I712 Thoracic aortic aneurysm, without rupture, unspecified: Secondary | ICD-10-CM

## 2014-11-11 NOTE — Telephone Encounter (Signed)
CT Angio results reported to patient - he voiced understanding.  Wanted recommendation for recall for repeat procedure - could not locate on summary of results. Will route.

## 2014-11-11 NOTE — Telephone Encounter (Signed)
Per Answering Service:Returning call from 11-07-14. If not there call [phone].

## 2014-11-18 NOTE — Telephone Encounter (Signed)
I spoke with Dr Gwenlyn Found and he wants the test repeated in 1 year.  Patient notified.

## 2014-12-22 ENCOUNTER — Emergency Department (HOSPITAL_COMMUNITY)
Admission: EM | Admit: 2014-12-22 | Discharge: 2014-12-22 | Disposition: A | Discharge status: Home / Self Care | Payer: Medicare Other | Attending: Emergency Medicine | Admitting: Emergency Medicine

## 2014-12-22 ENCOUNTER — Emergency Department (HOSPITAL_COMMUNITY): Payer: Medicare Other

## 2014-12-22 ENCOUNTER — Telehealth: Payer: Self-pay | Admitting: Neurology

## 2014-12-22 ENCOUNTER — Encounter (HOSPITAL_COMMUNITY): Payer: Self-pay

## 2014-12-22 DIAGNOSIS — I10 Essential (primary) hypertension: Secondary | ICD-10-CM | POA: Diagnosis not present

## 2014-12-22 DIAGNOSIS — I712 Thoracic aortic aneurysm, without rupture: Secondary | ICD-10-CM | POA: Insufficient documentation

## 2014-12-22 DIAGNOSIS — R569 Unspecified convulsions: Secondary | ICD-10-CM | POA: Diagnosis not present

## 2014-12-22 DIAGNOSIS — E785 Hyperlipidemia, unspecified: Secondary | ICD-10-CM | POA: Diagnosis not present

## 2014-12-22 LAB — URINE MICROSCOPIC-ADD ON

## 2014-12-22 LAB — CBC
HCT: 37.2 % — ABNORMAL LOW (ref 39.0–52.0)
Hemoglobin: 13 g/dL (ref 13.0–17.0)
MCH: 32.7 pg (ref 26.0–34.0)
MCHC: 34.9 g/dL (ref 30.0–36.0)
MCV: 93.5 fL (ref 78.0–100.0)
PLATELETS: 158 10*3/uL (ref 150–400)
RBC: 3.98 MIL/uL — ABNORMAL LOW (ref 4.22–5.81)
RDW: 12.3 % (ref 11.5–15.5)
WBC: 7.3 10*3/uL (ref 4.0–10.5)

## 2014-12-22 LAB — URINALYSIS, ROUTINE W REFLEX MICROSCOPIC
BILIRUBIN URINE: NEGATIVE
Glucose, UA: NEGATIVE mg/dL
Hgb urine dipstick: NEGATIVE
KETONES UR: NEGATIVE mg/dL
Leukocytes, UA: NEGATIVE
NITRITE: NEGATIVE
PH: 6.5 (ref 5.0–8.0)
Protein, ur: 30 mg/dL — AB
Specific Gravity, Urine: 1.018 (ref 1.005–1.030)
Urobilinogen, UA: 0.2 mg/dL (ref 0.0–1.0)

## 2014-12-22 LAB — BASIC METABOLIC PANEL
Anion gap: 8 (ref 5–15)
BUN: 20 mg/dL (ref 6–20)
CALCIUM: 8.6 mg/dL — AB (ref 8.9–10.3)
CO2: 24 mmol/L (ref 22–32)
Chloride: 101 mmol/L (ref 101–111)
Creatinine, Ser: 1.25 mg/dL — ABNORMAL HIGH (ref 0.61–1.24)
GFR calc Af Amer: 60 mL/min — ABNORMAL LOW (ref >60)
GFR calc non Af Amer: 51 mL/min — ABNORMAL LOW (ref >60)
Glucose, Bld: 131 mg/dL — ABNORMAL HIGH (ref 65–99)
Potassium: 4.8 mmol/L (ref 3.5–5.1)
Sodium: 133 mmol/L — ABNORMAL LOW (ref 135–145)

## 2014-12-22 MED ORDER — SODIUM CHLORIDE 0.9 % IV SOLN
1000.0000 mg | Freq: Once | INTRAVENOUS | Status: AC
  Administered 2014-12-22: 1000 mg via INTRAVENOUS
  Filled 2014-12-22: qty 10

## 2014-12-22 MED ORDER — LEVETIRACETAM 500 MG PO TABS: 500.0000 mg | ORAL_TABLET | Freq: Two times a day (BID) | ORAL | Status: DC

## 2014-12-22 MED ORDER — LEVETIRACETAM 500 MG PO TABS
500.0000 mg | ORAL_TABLET | Freq: Two times a day (BID) | ORAL | Status: DC
  Filled 2014-12-22 (×2): qty 1

## 2014-12-22 NOTE — Discharge Instructions (Signed)
Epilepsy People with epilepsy have times when they shake and jerk uncontrollably (seizures). This happens when there is a sudden change in brain function. Epilepsy may have many possible causes. Anything that disturbs the normal pattern of brain cell activity can lead to seizures. HOME CARE   Follow your doctor's instructions about driving and safety during normal activities.  Get enough sleep.  Only take medicine as told by your doctor.  Avoid things that you know can cause you to have seizures (triggers).  Write down when your seizures happen and what you remember about each seizure. Write down anything you think may have caused the seizure to happen.  Tell the people you live and work with that you have seizures. Make sure they know how to help you. They should:  Cushion your head and body.  Turn you on your side.  Not restrain you.  Not place anything inside your mouth.  Call for local emergency medical help if there is any question about what has happened.  Keep all follow-up visits with your doctor. This is very important. GET HELP IF:  You get an infection or start to feel sick. You may have more seizures when you are sick.  You are having seizures more often.  Your seizure pattern is changing. GET HELP RIGHT AWAY IF:   A seizure does not stop after a few seconds or minutes.  A seizure causes you to have trouble breathing.  A seizure gives you a very bad headache.  A seizure makes you unable to speak or use a part of your body. Document Released: 02/20/2009 Document Revised: 02/13/2013 Document Reviewed: 12/05/2012 [facility] Patient Information 2015 [place], [place]. This information is not intended to replace advice given to you by your health care provider. Make sure you discuss any questions you have with your health care provider.  Driving and Equipment Restrictions Some medical problems make it dangerous to drive, ride a bike, or use machines. Some of these  problems are:  A hard blow to the head (concussion).  Passing out (fainting).  Twitching and shaking (seizures).  Low blood sugar.  Taking medicine to help you relax (sedatives).  Taking pain medicines.  Wearing an eye patch.  Wearing splints. This can make it hard to use parts of your body that you need to drive safely. HOME CARE   Do not drive until your doctor says it is okay.  Do not use machines until your doctor says it is okay. You may need a form signed by your doctor (medical release) before you can drive again. You may also need this form before you do other tasks where you need to be fully alert. MAKE SURE YOU:  Understand these instructions.  Will watch your condition.  Will get help right away if you are not doing well or get worse. Document Released: 06/02/2004 Document Revised: 07/18/2011 Document Reviewed: 09/02/2009 [facility] Patient Information 2015 [place], [place]. This information is not intended to replace advice given to you by your health care provider. Make sure you discuss any questions you have with your health care provider.   YOU ARE NOT ALLOWED TO DRIVE BASED ON STATE REGULATIONS FOR 6 MONTHS OR UNTIL CLEARED BY YOUR NEUROLOGIST

## 2014-12-22 NOTE — Consult Note (Addendum)
Neurology Consultation Reason for Consult: Seizures Referring Physician: Palumbo, A  CC: Seizures  History is obtained from: Wife, patient  HPI: Zachary Padilla is a 81 y.o. male with a history of a single previous seizure at that time had MRI and EEG which were unremarkable. Given that it was his first seizure, he was not started on anti-seizure medicine. He had a sodium of 130 at the time.  He again had a seizure tonight. His wife describes beginning with vocalization with subsequent shaking. Neurology the emergency room he was still postictal, but has continued coming around and is now nearing baseline.    ROS: A 14 point ROS was performed and is negative except as noted in the HPI.   Past Medical History  Diagnosis Date  . 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 ALLERGIC RHINITIS 02/23/2009  . COLONIC POLYPS, HX OF 02/23/2009  . DIVERTICULITIS, HX OF 02/23/2009  . GERD 02/23/2009  . HYPERTENSION 02/23/2009  . URINARY INCONTINENCE 02/23/2009  . PONV (postoperative nausea and vomiting)   . Thoracic aortic aneurysm   . Hyperlipidemia   . Family history of heart disease   . Pre-syncope   . Convulsions/seizures 10/24/2014     Family History  Problem Relation Age of Onset  . Heart disease Other   . Stroke Other   . Heart disease Mother   . Stroke Father   . Heart disease Father   . Cancer Sister   . Cancer Brother      Social History:  reports that he has quit smoking. He has never used smokeless tobacco. He reports that he does not drink alcohol or use illicit drugs.   Exam: Current vital signs: Ht 5\' 10"  (1.778 m)  Wt 72.576 kg (160 lb)  BMI 22.96 kg/m2  SpO2 98% Vital signs in last 24 hours: SpO2:  [98 %] 98 % (08/15 0031) Weight:  [72.576 kg (160 lb)] 72.576 kg (160 lb) (08/15 0036)   Physical Exam  Constitutional: Appears well-developed and well-nourished.  Psych: Affect appropriate to situation Eyes: No scleral injection HENT: No OP obstrucion Head: Normocephalic.   Cardiovascular: Normal rate and regular rhythm.  Respiratory: Effort normal and breath sounds normal to anterior ascultation GI: Soft.  No distension. There is no tenderness.  Skin: WDI  Neuro: Mental Status: Patient is awake, alert, oriented to person, place, month, year, and situation. Patient is able to give a clear and coherent history. No signs of aphasia or neglect Cranial Nerves: II: Visual Fields are full. Pupils are equal, round, and reactive to light.   III,IV, VI: EOMI without ptosis or diploplia.  V: Facial sensation is symmetric to temperature VII: Facial movement is symmetric.  VIII: hearing is intact to voice X: Uvula elevates symmetrically XI: Shoulder shrug is symmetric. XII: tongue is midline without atrophy or fasciculations.  Motor: Tone is normal. Bulk is normal. 5/5 strength was present in all four extremities.  Sensory: Sensation is symmetric to light touch and temperature in the arms and legs. Cerebellar: FNF  intact bilaterally   I have reviewed labs in epic and the results pertinent to this consultation are: UA-unremarkable CMP sodium 133  I have reviewed the images obtained:CT head unremarkable  Impression: 81 yo M with a history of now two unprovoked seizures. I do not think his sodium of 133 was contributory to this event. I would favor starting anti-epileptic therapy at this time. He is still a little slow, but mostly back to baseline.   Recommendations: 1) Keppra 500mg  BID  2) Patient is unable to drive, operate heavy machinery, perform activities at heights or participate in water activities until release by outpatient physician. This was discussed with the patient who expressed understanding.  3) Would be reasonable to observe until completely back to baseline.    Roland Rack, MD [facility] [phone]  If 7pm- 7am, please page neurology on call as listed in [place].

## 2014-12-22 NOTE — ED Notes (Signed)
Pt comes from home via [facility], seizure activity, no hx of seizures, had one episode like this in June. Pt wife found him thrashing around in bed, pt post ictal now.

## 2014-12-22 NOTE — Telephone Encounter (Signed)
They put him on Kepra at the hospital and recommended 500mg  twice per day. "That seems like a lot. And it mentions side effects of kidney damage (this bothers him since he has had a little problem with his kidneys), hallucinations, anxiety, depression, suicide etc." Patient would like to talk to you about this. Is supposed to start 1st dose today, to be taken around 6pm. Would love to hear from someone today if possible.

## 2014-12-22 NOTE — Telephone Encounter (Signed)
I called patient. The patient had another seizure out of sleep last night. They gave him Keppra, starting at 500 mg twice daily. He apparently has some concerns about this. I think it is okay to take, I was started one half tablet twice daily for 2 weeks, then take 1 full tablet twice daily. He is to contact me if he has any issues, I will see him in September 2016.

## 2014-12-22 NOTE — Telephone Encounter (Signed)
I called the patient. He is not sure exactly what his diagnosis related to his kidneys is. He stated all he could remember for sure was that it was "level 3." He wanted to check with Dr. Jannifer Franklin about taking this medication, which he is supposed to start at 6 PM today.

## 2014-12-22 NOTE — ED Provider Notes (Signed)
CSN: [id number]   Arrival date & time 12/22/14 0028  History  This chart was scribed for  Zachary Pann, MD by Altamease Oiler, ED Scribe. This patient was seen in room A01C/A01C and the patient's care was started at 12:38 AM.  Chief Complaint  Patient presents with  . Seizures   Level V caveat secondary to the patient's post ictal status HPI Patient is a 81 y.o. male presenting with seizures. The history is provided by the spouse. No language interpreter was used.  Seizures Seizure activity on arrival: no   Preceding symptoms: no sensation of an aura present   Initial focality:  Diffuse Episode characteristics: abnormal movements and focal shaking   Postictal symptoms: confusion and memory loss   Severity:  Moderate Duration:  5 minutes Timing:  Once Number of seizures this episode:  1 Progression:  Improving Context: not change in medication, not fever and not previous head injury   Recent head injury:  No recent head injuries PTA treatment:  None History of seizures: yes   Similar to previous episodes: yes   Severity:  Mild Current therapy:  None  Korby Ratay is a 81 y.o. male who presents to the Emergency Department complaining of a seizure tonight. Per wife and daughter the pt had thrashing seizure-like activity for 5 minutes with no bowel or bladder incontinence. He had 1 seizure months ago that he was worked up for but not prescribed medication. At that time he had low sodium. He has been told not to drink a lot of water but has recently increased his water intake due to the heat. Today the pt has been sweating and felt hot to touch. No recent fever, sore throat, cough, nausea, vomiting, or SOB. No recent  Trauma, medication change, or environmental change.   Past Medical History  Diagnosis Date  . 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 ALLERGIC RHINITIS 02/23/2009  . COLONIC POLYPS, HX OF 02/23/2009  . DIVERTICULITIS, HX OF 02/23/2009  . GERD 02/23/2009  . HYPERTENSION 02/23/2009   . URINARY INCONTINENCE 02/23/2009  . PONV (postoperative nausea and vomiting)   . Thoracic aortic aneurysm   . Hyperlipidemia   . Family history of heart disease   . Pre-syncope   . Convulsions/seizures 10/24/2014    Past Surgical History  Procedure Laterality Date  . Appendectomy  1946  . Sinus surgery  2008  . Removal of pre-cancer bump on neck  2005  . Left ear tube  2001  . Ruptured appendics  1946  . Cataract extraction Bilateral     Family History  Problem Relation Age of Onset  . Heart disease Other   . Stroke Other   . Heart disease Mother   . Stroke Father   . Heart disease Father   . Cancer Sister   . Cancer Brother     Social History  Substance Use Topics  . Smoking status: Former Research scientist (life sciences)  . Smokeless tobacco: Never Used     Comment: Lives with spouse-independant in all ADLs. [employer] and [employer] at [employer], Married in "52"  . Alcohol Use: No     Review of Systems  Constitutional: Positive for diaphoresis. Negative for fever.  HENT: Negative for sore throat.   Respiratory: Negative for cough and shortness of breath.   Gastrointestinal: Negative for nausea and vomiting.  Neurological: Positive for seizures. Negative for numbness.  All other systems reviewed and are negative.    Home Medications   Prior to Admission medications  Medication Sig Start Date End Date Taking? Authorizing Provider  aspirin EC 81 MG tablet Take 81 mg by mouth daily.    Historical Provider, MD  Azelaic Acid (FINACEA) 15 % cream Apply 1 application topically 2 (two) times daily as needed. For [place]    Historical Provider, MD  Bromfenac Sodium (PROLENSA OP) Apply 1 drop to eye daily. Right eye only    Historical Provider, MD  COZAAR 50 MG tablet TAKE ONE TABLET EACH DAY 01/23/14   Lorretta Harp, MD  guaiFENesin (MUCINEX) 600 MG 12 hr tablet Take 600 mg by mouth every 12 (twelve) hours as needed for cough or to loosen phlegm.     Historical Provider,  MD  Probiotic Product (ALIGN PO) Take 1 tablet by mouth daily.    Historical Provider, MD  sodium chloride (OCEAN) 0.65 % SOLN nasal spray Place 1-2 sprays into both nostrils at bedtime.    Historical Provider, MD  tobramycin (TOBREX) 0.3 % ophthalmic solution 2 drops every 8 (eight) hours.    Historical Provider, MD    Allergies  Ace inhibitors; Ciprodex; Erythromycin; Levofloxacin; Penicillins; and Sulfonamide derivatives  Triage Vitals: Ht 5\' 10"  (1.778 m)  Wt 160 lb (72.576 kg)  BMI 22.96 kg/m2  SpO2 98%  Physical Exam  Constitutional: He is oriented to person, place, and time. He appears well-developed and well-nourished.  HENT:  Head: Normocephalic.  Mouth/Throat: Oropharynx is clear and moist.  Moist mucous membranes  Eyes: EOM are normal. Pupils are equal, round, and reactive to light.  Neck: Normal range of motion.  Trachea midline  Cardiovascular: Normal rate, regular rhythm and intact distal pulses.   Pulmonary/Chest: Effort normal and breath sounds normal. No respiratory distress. He has no wheezes. He has no rales.  Lungs CTAB  Abdominal: Soft. Bowel sounds are normal. He exhibits no distension. There is no tenderness. There is no rebound and no guarding.  Musculoskeletal: Normal range of motion.  Lymphadenopathy:    He has no cervical adenopathy.  Neurological: He is alert and oriented to person, place, and time. He has normal reflexes.  Skin: Skin is warm and dry.  Psychiatric: He has a normal mood and affect.  Nursing note and vitals reviewed.   ED Course  Procedures   DIAGNOSTIC STUDIES: Oxygen Saturation is 98% on RA, normal by my interpretation.    COORDINATION OF CARE: 12:43 AM Discussed treatment plan which includes EKG and lab work with the pt and his family at bedside and they agreed to the plan.  2:51 AM-Consult complete with Dr. Leonel Ramsay (Neuro Hospitalist). Patient case explained and discussed. He agrees with the Keppra load and will see the  pt in the ED. Call ended at 2:51 AM.  3:14 AM D/w Dr. Leonel Ramsay who saw the pt. Plan for admission.   Labs Review-  Labs Reviewed  BASIC METABOLIC PANEL  CBC  CBG MONITORING, ED    Imaging Review No results found.  EKG Interpretation  Date/Time:  Monday December 22 2014 00:38:31 EDT Ventricular Rate:  78 PR Interval:  239 QRS Duration: 140 QT Interval:  376 QTC Calculation: 428 R Axis:   -12 Text Interpretation:  Sinus rhythm Prolonged PR interval Right bundle branch block Confirmed by [facility]  MD, Aisha Greenberger ([id number]) on 12/22/2014 12:44:57 AM       MDM   wILL ADMIT TO MEDICINE.  sEE dR. Kirkpatrick's note  I personally performed the services described in this documentation, which was scribed in my presence. The recorded information has  been reviewed and is accurate.       Veatrice Kells, MD 12/22/14 [phone]

## 2015-01-13 ENCOUNTER — Encounter: Payer: Self-pay | Admitting: Neurology

## 2015-01-13 ENCOUNTER — Ambulatory Visit (INDEPENDENT_AMBULATORY_CARE_PROVIDER_SITE_OTHER): Payer: Medicare Other | Admitting: Neurology

## 2015-01-13 VITALS — BP 116/70 | HR 69 | Ht 69.0 in | Wt 161.5 lb

## 2015-01-13 DIAGNOSIS — R569 Unspecified convulsions: Secondary | ICD-10-CM | POA: Diagnosis not present

## 2015-01-13 MED ORDER — LEVETIRACETAM 500 MG PO TABS: 500.0000 mg | ORAL_TABLET | Freq: Two times a day (BID) | ORAL | Status: DC

## 2015-01-13 NOTE — Progress Notes (Signed)
Reason for visit: Seizures  Zachary Padilla is an 81 y.o. male  History of present illness:  Zachary Padilla is an 81 year old right-handed Zachary Padilla male with a history of a nocturnal seizure that occurred on 10/19/2014. The patient was seen and evaluated for this, he underwent MRI evaluation of the brain that did not show any acute disease. The patient had an EEG study that was normal. He was not treated with any anti-epileptic medications initially, but he had a recurring nocturnal seizure on 12/22/2014. The patient was placed on Keppra, he returns to this office for an evaluation. He currently is on 250 mg twice daily. He is not operating motor vehicle at this time. The wife indicates that he does snore, but not severely. He does report some fatigue issues, but no significant excessive daytime drowsiness.   Past Medical History  Diagnosis Date  . 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 ALLERGIC RHINITIS 02/23/2009  . COLONIC POLYPS, HX OF 02/23/2009  . DIVERTICULITIS, HX OF 02/23/2009  . GERD 02/23/2009  . HYPERTENSION 02/23/2009  . URINARY INCONTINENCE 02/23/2009  . PONV (postoperative nausea and vomiting)   . Thoracic aortic aneurysm   . Hyperlipidemia   . Family history of heart disease   . Pre-syncope   . Convulsions/seizures 10/24/2014    Past Surgical History  Procedure Laterality Date  . Appendectomy  1946  . Sinus surgery  2008  . Removal of pre-cancer bump on neck  2005  . Left ear tube  2001  . Ruptured appendics  1946  . Cataract extraction Bilateral     Family History  Problem Relation Age of Onset  . Heart disease Other   . Stroke Other   . Heart disease Mother   . Stroke Father   . Heart disease Father   . Cancer Sister   . Cancer Brother     Social history:  reports that he has quit smoking. He has never used smokeless tobacco. He reports that he does not drink alcohol or use illicit drugs.    Allergies  Allergen Reactions  . Ace Inhibitors Other (See Comments)   unknown  . Ciprodex [Ciprofloxacin-Dexamethasone] Other (See Comments)    unknown  . Erythromycin Nausea And Vomiting  . Levofloxacin Other (See Comments)    dehydration  . Penicillins Nausea And Vomiting  . Sulfonamide Derivatives Nausea And Vomiting    Medications:  Prior to Admission medications   Medication Sig Start Date End Date Taking? Authorizing Provider  aspirin EC 81 MG tablet Take 81 mg by mouth daily.   Yes Historical Provider, MD  Azelaic Acid (FINACEA) 15 % cream Apply 1 application topically 2 (two) times daily as needed. For [place]   Yes Historical Provider, MD  COZAAR 50 MG tablet TAKE ONE TABLET EACH DAY 01/23/14  Yes Lorretta Harp, MD  guaiFENesin (MUCINEX) 600 MG 12 hr tablet Take 600 mg by mouth 2 (two) times daily as needed.   Yes Historical Provider, MD  levETIRAcetam (KEPPRA) 500 MG tablet Take 1 tablet (500 mg total) by mouth 2 (two) times daily. Patient taking differently: Take 250 mg by mouth 2 (two) times daily.  12/22/14  Yes April Palumbo, MD    ROS:  Out of a complete 14 system review of symptoms, the patient complains only of the following symptoms, and all other reviewed systems are negative.  Seizure  Blood pressure 116/70, pulse 69, height 5\' 9"  (1.753 m), weight 161 lb 8 oz (73.256 kg).  Physical Exam  General: The patient is alert and cooperative at the time of the examination.  Skin: No significant peripheral edema is noted.   Neurologic Exam  Mental status: The patient is alert and oriented x 3 at the time of the examination. The patient has apparent normal recent and remote memory, with an apparently normal attention span and concentration ability.   Cranial nerves: Facial symmetry is present. Speech is normal, no aphasia or dysarthria is noted. Extraocular movements are full. Visual fields are full.  Motor: The patient has good strength in all 4 extremities.  Sensory examination: Soft touch sensation is symmetric on the face,  arms, and legs.  Coordination: The patient has good finger-nose-finger and heel-to-shin bilaterally.  Gait and station: The patient has a normal gait. Tandem gait is normal. Romberg is negative. No drift is seen.  Reflexes: Deep tendon reflexes are symmetric.   Assessment/Plan:  1. Nocturnal seizures  The patient is now on Keppra taking 250 mg twice daily. I have recommended that he increase the dose to 500 mg twice daily. He is not to operate a motor vehicle for least 6 months following the seizure in August. He will follow-up in 4 or 5 months. He is to contact our office if another seizure occurrence is noted.  Jill Alexanders MD 01/13/2015 7:38 PM  [facility] [address] [place], [place] [address]  Phone [phone] Fax [phone]

## 2015-01-13 NOTE — Patient Instructions (Addendum)
We will increase the Keppra to 500 mg twice a day.   Epilepsy Epilepsy is a disorder in which a person has repeated seizures over time. A seizure is a release of abnormal electrical activity in the brain. Seizures can cause a change in attention, behavior, or the ability to remain awake and alert (altered mental status). Seizures often involve uncontrollable shaking (convulsions).  Most people with epilepsy lead normal lives. However, people with epilepsy are at an increased risk of falls, accidents, and injuries. Therefore, it is important to begin treatment right away. CAUSES  Epilepsy has many possible causes. Anything that disturbs the normal pattern of brain cell activity can lead to seizures. This may include:   Head injury.  Birth trauma.  High fever as a child.  Stroke.  Bleeding into or around the brain.  Certain drugs.  Prolonged low oxygen, such as what occurs after CPR efforts.  Abnormal brain development.  Certain illnesses, such as meningitis, encephalitis (brain infection), malaria, and other infections.  An imbalance of nerve signaling chemicals (neurotransmitters).  SIGNS AND SYMPTOMS  The symptoms of a seizure can vary greatly from one person to another. Right before a seizure, you may have a warning (aura) that a seizure is about to occur. An aura may include the following symptoms:  Fear or anxiety.  Nausea.  Feeling like the room is spinning (vertigo).  Vision changes, such as seeing flashing lights or spots. Common symptoms during a seizure include:  Abnormal sensations, such as an abnormal smell or a bitter taste in the mouth.   Sudden, general body stiffness.   Convulsions that involve rhythmic jerking of the face, arm, or leg on one or both sides.   Sudden change in consciousness.   Appearing to be awake but not responding.   Appearing to be asleep but cannot be awakened.   Grimacing, chewing, lip smacking, drooling, tongue biting,  or loss of bowel or bladder control. After a seizure, you may feel sleepy for a while. DIAGNOSIS  Your health care provider will ask about your symptoms and take a medical history. Descriptions from any witnesses to your seizures will be very helpful in the diagnosis. A physical exam, including a detailed neurological exam, is necessary. Various tests may be done, such as:   An electroencephalogram (EEG). This is a painless test of your brain waves. In this test, a diagram is created of your brain waves. These diagrams can be interpreted by a specialist.  An MRI of the brain.   A CT scan of the brain.   A spinal tap (lumbar puncture, LP).  Blood tests to check for signs of infection or abnormal blood chemistry. TREATMENT  There is no cure for epilepsy, but it is generally treatable. Once epilepsy is diagnosed, it is important to begin treatment as soon as possible. For most people with epilepsy, seizures can be controlled with medicines. The following may also be used:  A pacemaker for the brain (vagus nerve stimulator) can be used for people with seizures that are not well controlled by medicine.  Surgery on the brain. For some people, epilepsy eventually goes away. HOME CARE INSTRUCTIONS   Follow your health care provider's recommendations on driving and safety in normal activities.  Get enough rest. Lack of sleep can cause seizures.  Only take over-the-counter or prescription medicines as directed by your health care provider. Take any prescribed medicine exactly as directed.  Avoid any known triggers of your seizures.  Keep a seizure diary. Record  what you recall about any seizure, especially any possible trigger.   Make sure the people you live and work with know that you are prone to seizures. They should receive instructions on how to help you. In general, a witness to a seizure should:   Cushion your head and body.   Turn you on your side.   Avoid unnecessarily  restraining you.   Not place anything inside your mouth.   Call for emergency medical help if there is any question about what has occurred.   Follow up with your health care provider as directed. You may need regular blood tests to monitor the levels of your medicine.  SEEK MEDICAL CARE IF:   You develop signs of infection or other illness. This might increase the risk of a seizure.   You seem to be having more frequent seizures.   Your seizure pattern is changing.  SEEK IMMEDIATE MEDICAL CARE IF:   You have a seizure that does not stop after a few moments.   You have a seizure that causes any difficulty in breathing.   You have a seizure that results in a very severe headache.   You have a seizure that leaves you with the inability to speak or use a part of your body.  Document Released: 04/25/2005 Document Revised: 02/13/2013 Document Reviewed: 12/05/2012 [place] Patient Information 2015 [place], [place]. This information is not intended to replace advice given to you by your health care provider. Make sure you discuss any questions you have with your health care provider.

## 2015-01-21 ENCOUNTER — Encounter: Payer: Self-pay | Admitting: Cardiovascular Disease

## 2015-01-27 ENCOUNTER — Telehealth: Payer: Self-pay | Admitting: Family Medicine

## 2015-01-27 ENCOUNTER — Ambulatory Visit: Payer: Medicare Other | Admitting: Cardiovascular Disease

## 2015-01-27 NOTE — Telephone Encounter (Signed)
Unfortunately, I'm not able to accept any more new patients at this time.  I'm sorry! Thank you!  

## 2015-01-27 NOTE — Telephone Encounter (Signed)
Pt would like to know if you would take him on has a new pt?

## 2015-01-30 ENCOUNTER — Telehealth: Payer: Self-pay | Admitting: Cardiovascular Disease

## 2015-01-30 NOTE — Telephone Encounter (Signed)
Pt said Dr Margette Fast put him on Levetiracetam 500 mg 2 times a day.He had a seizure like episode,that why he was put on this medicine.He was supposed to see Dr Gwenlyn Found on 01-27-15 and it was moved to 03-11-15. Does he need to be seen sooner?

## 2015-01-30 NOTE — Telephone Encounter (Signed)
Pt fielded some neurology related questions, regarding seizure d/o, keppra, etc. Answered these to the best of my ability and advised to call neurology office.  No cardiac complaints. Pt advised to f/u as scheduled. He verb'd understanding & thanks for the call.

## 2015-02-06 ENCOUNTER — Ambulatory Visit: Payer: Medicare Other | Admitting: Neurology

## 2015-02-06 ENCOUNTER — Other Ambulatory Visit: Payer: Self-pay | Admitting: *Deleted

## 2015-02-06 MED ORDER — COZAAR 50 MG PO TABS: ORAL_TABLET | ORAL | Status: DC

## 2015-02-11 ENCOUNTER — Telehealth: Payer: Self-pay | Admitting: General Practice

## 2015-02-11 NOTE — Telephone Encounter (Signed)
Use to be [facility] patient.  Would like to know if Dr. Alain Marion would take on as patient.

## 2015-02-12 NOTE — Telephone Encounter (Signed)
Spoke with Dr. Camila Li. He is unable to take patients on at this time.  Did offer to establish care with Terri Piedra or Dr. Quay Burow.

## 2015-02-16 ENCOUNTER — Telehealth: Payer: Self-pay | Admitting: Neurology

## 2015-02-16 NOTE — Telephone Encounter (Signed)
Pt called inquiring if he could get flu injection while taking levETIRAcetam (KEPPRA) 500 MG tablet . Please call and advise. Patient can be reached [phone]. Pt is going out of town W[place] and is wanting to get injection tomorrow.

## 2015-02-16 NOTE — Telephone Encounter (Signed)
I called the patient. Per Jinny Blossom, NP, it is okay for him to get the flu vaccine while taking Keppra. Patient verbalized understanding and thanked me for calling.

## 2015-02-25 ENCOUNTER — Telehealth: Payer: Self-pay | Admitting: Neurology

## 2015-02-25 NOTE — Telephone Encounter (Signed)
I called patient. I see no interaction between Keppra and doxycycline. The patient is okay to take this antibiotic.

## 2015-02-25 NOTE — Telephone Encounter (Signed)
Patient was advised by Dr. Ovidio Hanger to check with Dr. Jannifer Franklin to make sure it's ok to take Doxycycline that he has prescribed along with levETIRAcetam (KEPPRA) 500 MG tablet  that Dr. Jannifer Franklin has prescribed. Please call patient to advise [phone].

## 2015-03-11 ENCOUNTER — Ambulatory Visit (INDEPENDENT_AMBULATORY_CARE_PROVIDER_SITE_OTHER): Payer: Medicare Other | Admitting: Cardiovascular Disease

## 2015-03-11 ENCOUNTER — Encounter: Payer: Self-pay | Admitting: Cardiovascular Disease

## 2015-03-11 VITALS — BP 128/70 | HR 64 | Ht 69.0 in | Wt 157.0 lb

## 2015-03-11 DIAGNOSIS — I10 Essential (primary) hypertension: Secondary | ICD-10-CM | POA: Diagnosis not present

## 2015-03-11 DIAGNOSIS — E785 Hyperlipidemia, unspecified: Secondary | ICD-10-CM

## 2015-03-11 NOTE — Patient Instructions (Addendum)
Medication Instructions:  Your physician recommends that you continue on your current medications as directed. Please refer to the Current Medication list given to you today.   Labwork: Your physician recommends that you return for lab work in: FASTING (LIPID/LIVER) The lab can be found on the FIRST FLOOR of out building in [address]   Testing/Procedures: NONE  Follow-Up: Your physician wants you to follow-up in: [place] will receive a reminder letter in the mail two months in advance. If you don't receive a letter, please call our office to schedule the follow-up appointment.     Any Other Special Instructions Will Be Listed Below (If Applicable).  Primary Care Provider: [facility] - Dr. Crist Infante or Dr. Marton Redwood    If you need a refill on your cardiac medications before your next appointment, please call your pharmacy.

## 2015-03-11 NOTE — Assessment & Plan Note (Signed)
History of hyperlipidemia in the past although he has not had a lipid profile in our system for over 3 years and is not on a statin drug

## 2015-03-11 NOTE — Progress Notes (Signed)
03/11/2015 Zachary Padilla   [birth year]  [id number]  Primary Physician Cammy Copa, MD Primary Cardiologist: Lorretta Harp MD Renae Gloss   HPI:  Zachary Padilla is an 81 year old in and fit appearing married Caucasian male father of 2, grandfather 4 grandchildren who I saw last 01/23/14. He has a history of hypertension, mild hyperlipidemia and family history of heart disease as well as chronic right bundle branch block. His mother died of an MI at age 57. A Myoview stress test performed 01/19/12 was entirely normal. He denies chest pain or shortness of breath. A chest CT angiogram was performed 03/22/12 for evaluation of potential lung cancer did incidentally reveal a 4.3 cm descending thoracic aortic aneurysm which he has been asymptomatic fromthis was rechecked April of this year and was unchanged. He denies chest pain or shortness of breath. His most recent CT scan performed 11/06/14 revealed his ascending aneurysm measuring 4 cm at its maximum dimension   Current Outpatient Prescriptions  Medication Sig Dispense Refill  . aspirin EC 81 MG tablet Take 81 mg by mouth daily.    . Azelaic Acid (FINACEA) 15 % cream Apply 1 application topically 2 (two) times daily as needed. For Roscacea    . COZAAR 50 MG tablet TAKE ONE TABLET EACH DAY 30 tablet 2  . guaiFENesin (MUCINEX) 600 MG 12 hr tablet Take 600 mg by mouth 2 (two) times daily as needed.    . levETIRAcetam (KEPPRA) 500 MG tablet Take 1 tablet (500 mg total) by mouth 2 (two) times daily. 60 tablet 5  . Probiotic Product (ALIGN PO) Take by mouth daily.     No current facility-administered medications for this visit.    Allergies  Allergen Reactions  . Ace Inhibitors Other (See Comments)    unknown  . Ciprodex [Ciprofloxacin-Dexamethasone] Other (See Comments)    unknown  . Erythromycin Nausea And Vomiting  . Levofloxacin Other (See Comments)    dehydration  . Penicillins Nausea And Vomiting  . Sulfonamide  Derivatives Nausea And Vomiting    Social History   Social History  . Marital Status: Married    Spouse Name: N/A  . Number of Children: 2  . Years of Education: 15   Occupational History  . retired    Social History Main Topics  . Smoking status: Former Research scientist (life sciences)  . Smokeless tobacco: Never Used     Comment: Lives with spouse-independant in all ADLs. [employer] and [employer] at [employer], Married in "52"  . Alcohol Use: No  . Drug Use: No  . Sexual Activity: Yes   Other Topics Concern  . Not on file   Social History Narrative   Patient occasionally drinks tea.   Patient is right handed.     Review of Systems: General: negative for chills, fever, night sweats or weight changes.  Cardiovascular: negative for chest pain, dyspnea on exertion, edema, orthopnea, palpitations, paroxysmal nocturnal dyspnea or shortness of breath Dermatological: negative for rash Respiratory: negative for cough or wheezing Urologic: negative for hematuria Abdominal: negative for nausea, vomiting, diarrhea, bright red blood per rectum, melena, or hematemesis Neurologic: negative for visual changes, syncope, or dizziness All other systems reviewed and are otherwise negative except as noted above.    Blood pressure 128/70, pulse 64, height 5\' 9"  (1.753 m), weight 157 lb (71.215 kg).  General appearance: alert and no distress Neck: no adenopathy, no carotid bruit, no JVD, supple, symmetrical, trachea midline and thyroid not enlarged, symmetric, no tenderness/mass/nodules Lungs:  clear to auscultation bilaterally Heart: regular rate and rhythm, S1, S2 normal, no murmur, click, rub or gallop Extremities: extremities normal, atraumatic, no cyanosis or edema  EKG not performed today  ASSESSMENT AND PLAN:   Essential hypertension History of hypertension with blood pressure measurements at 120/70. He is on Cozaar. Continue current meds at current dosing  Hyperlipidemia History of  hyperlipidemia in the past although he has not had a lipid profile in our system for over 3 years and is not on a statin drug  Thoracic aortic aneurysm History of thoracic aortic aneurysm incidentally found by CT scanning with recent CT performed 11/06/14 revealing maximum dimensions of 4 cm compared to 4.2 in the past. This has remained stable      Lorretta Harp MD [facility], [facility] 03/11/2015 12:21 PM

## 2015-03-11 NOTE — Assessment & Plan Note (Signed)
History of hypertension with blood pressure measurements at 120/70. He is on Cozaar. Continue current meds at current dosing

## 2015-03-11 NOTE — Assessment & Plan Note (Signed)
History of thoracic aortic aneurysm incidentally found by CT scanning with recent CT performed 11/06/14 revealing maximum dimensions of 4 cm compared to 4.2 in the past. This has remained stable

## 2015-03-27 LAB — HEPATIC FUNCTION PANEL
ALK PHOS: 84 U/L (ref 40–115)
ALT: 11 U/L (ref 9–46)
AST: 18 U/L (ref 10–35)
Albumin: 3.7 g/dL (ref 3.6–5.1)
BILIRUBIN DIRECT: 0.2 mg/dL (ref <=0.2)
BILIRUBIN INDIRECT: 0.5 mg/dL (ref 0.2–1.2)
BILIRUBIN TOTAL: 0.7 mg/dL (ref 0.2–1.2)
Total Protein: 6.3 g/dL (ref 6.1–8.1)

## 2015-03-27 LAB — LIPID PANEL
CHOL/HDL RATIO: 3.2 ratio (ref <=5.0)
CHOLESTEROL: 126 mg/dL (ref 125–200)
HDL: 39 mg/dL — AB (ref >=40)
LDL Cholesterol: 70 mg/dL (ref <130)
TRIGLYCERIDES: 85 mg/dL (ref <150)
VLDL: 17 mg/dL (ref <30)

## 2015-03-30 ENCOUNTER — Telehealth: Payer: Self-pay | Admitting: *Deleted

## 2015-03-30 ENCOUNTER — Encounter: Payer: Self-pay | Admitting: Cardiovascular Disease

## 2015-03-30 NOTE — Telephone Encounter (Signed)
Zachary Padilla is returning call about his results . Please call   Thanks

## 2015-03-30 NOTE — Telephone Encounter (Signed)
Called pt about recent labs. He wanted to make dr berry aware he is currently taking kappra for seizures. He wants to make sure it is okay to take with his other medications. He will occ have swelling in his feet and ankles, denies SOB. Will forward for dr berry's review

## 2015-03-30 NOTE — Telephone Encounter (Signed)
This encounter was created in error - please disregard.

## 2015-03-31 NOTE — Telephone Encounter (Signed)
No problem with his Keppra

## 2015-03-31 NOTE — Telephone Encounter (Signed)
Spoke with pt, aware of dr berry's recommendations. 

## 2015-04-16 ENCOUNTER — Ambulatory Visit
Admission: RE | Admit: 2015-04-16 | Discharge: 2015-04-16 | Disposition: A | Discharge status: Home / Self Care | Payer: Medicare Other | Source: Ambulatory Visit | Attending: Family Medicine | Admitting: Family Medicine

## 2015-04-16 ENCOUNTER — Other Ambulatory Visit: Payer: Self-pay | Admitting: Family Medicine

## 2015-04-16 DIAGNOSIS — R059 Cough, unspecified: Secondary | ICD-10-CM

## 2015-04-16 DIAGNOSIS — R05 Cough: Secondary | ICD-10-CM

## 2015-05-13 ENCOUNTER — Other Ambulatory Visit: Payer: Self-pay | Admitting: Cardiovascular Disease

## 2015-05-13 NOTE — Telephone Encounter (Signed)
Rx request sent to pharmacy.  

## 2015-05-18 ENCOUNTER — Ambulatory Visit: Payer: Medicare Other | Admitting: Adult Health

## 2015-05-19 ENCOUNTER — Telehealth: Payer: Self-pay

## 2015-05-19 ENCOUNTER — Ambulatory Visit (INDEPENDENT_AMBULATORY_CARE_PROVIDER_SITE_OTHER): Payer: Medicare Other | Admitting: Adult Health

## 2015-05-19 ENCOUNTER — Encounter: Payer: Self-pay | Admitting: Adult Health

## 2015-05-19 DIAGNOSIS — R569 Unspecified convulsions: Secondary | ICD-10-CM | POA: Diagnosis not present

## 2015-05-19 NOTE — Telephone Encounter (Signed)
Called patient to r/s appt from 05/17/14 due to weather cancellation. Scheduled patient for today.

## 2015-05-19 NOTE — Progress Notes (Signed)
PATIENT: Zachary Padilla DOB: [birth year]  REASON FOR VISIT: follow up- seizure HISTORY FROM: patient  HISTORY OF PRESENT ILLNESS: Zachary Padilla is an 82 year old male with a history of nocturnal seizures. He returns today for follow-up. The patient remains on Keppra 500 mg twice a day. The patient reports that initially he noticed some drowsiness with the Keppra however that has resolved. His wife reports a slight change in his personality but nothing significant. The patient states that when he had his first seizure in June he was under a lot of stress trying to sell his brother's estate. He is curious if this had any effect on his seizure. The patient states that he has not had any additional seizure events. He does not operate a motor vehicle. He is able to complete all ADLs and apparently. He denies any new neurological symptoms. He returns today for an evaluation.  HISTORY 01/13/15 (WILLIS): Zachary Padilla is an 82 year old right-handed [place] male with a history of a nocturnal seizure that occurred on 10/19/2014. The patient was seen and evaluated for this, he underwent MRI evaluation of the brain that did not show any acute disease. The patient had an EEG study that was normal. He was not treated with any anti-epileptic medications initially, but he had a recurring nocturnal seizure on 12/22/2014. The patient was placed on Keppra, he returns to this office for an evaluation. He currently is on 250 mg twice daily. He is not operating motor vehicle at this time. The wife indicates that he does snore, but not severely. He does report some fatigue issues, but no significant excessive daytime drowsiness.  REVIEW OF SYSTEMS: Out of a complete 14 system review of symptoms, the patient complains only of the following symptoms, and all other reviewed systems are negative.  Ringing in ears, joint swelling, muscle cramps, numbness  ALLERGIES: Allergies  Allergen Reactions  . Ace Inhibitors Other (See Comments)      unknown  . Ciprodex [Ciprofloxacin-Dexamethasone] Other (See Comments)    unknown  . Erythromycin Nausea And Vomiting  . Levofloxacin Other (See Comments)    dehydration  . Penicillins Nausea And Vomiting  . Sulfonamide Derivatives Nausea And Vomiting    HOME MEDICATIONS: Outpatient Prescriptions Prior to Visit  Medication Sig Dispense Refill  . aspirin EC 81 MG tablet Take 81 mg by mouth daily.    . Azelaic Acid (FINACEA) 15 % cream Apply 1 application topically 2 (two) times daily as needed. For Roscacea    . COZAAR 50 MG tablet TAKE ONE TABLET EACH DAY 30 tablet 10  . guaiFENesin (MUCINEX) 600 MG 12 hr tablet Take 600 mg by mouth 2 (two) times daily as needed.    . levETIRAcetam (KEPPRA) 500 MG tablet Take 1 tablet (500 mg total) by mouth 2 (two) times daily. 60 tablet 5  . Probiotic Product (ALIGN PO) Take by mouth daily.     No facility-administered medications prior to visit.    PAST MEDICAL HISTORY: Past Medical History  Diagnosis Date  . 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 ALLERGIC RHINITIS 02/23/2009  . COLONIC POLYPS, HX OF 02/23/2009  . DIVERTICULITIS, HX OF 02/23/2009  . GERD 02/23/2009  . HYPERTENSION 02/23/2009  . URINARY INCONTINENCE 02/23/2009  . PONV (postoperative nausea and vomiting)   . Thoracic aortic aneurysm ([place])   . Hyperlipidemia   . Family history of heart disease   . Pre-syncope   . Convulsions/seizures ([place]) 10/24/2014    PAST SURGICAL HISTORY: Past Surgical History  Procedure Laterality  Date  . Appendectomy  1946  . Sinus surgery  2008  . Removal of pre-cancer bump on neck  2005  . Left ear tube  2001  . Ruptured appendics  1946  . Cataract extraction Bilateral     FAMILY HISTORY: Family History  Problem Relation Age of Onset  . Heart disease Other   . Stroke Other   . Heart disease Mother   . Stroke Father   . Heart disease Father   . Cancer Sister   . Cancer Brother     SOCIAL HISTORY: Social History   Social History  .  Marital Status: Married    Spouse Name: N/A  . Number of Children: 2  . Years of Education: 15   Occupational History  . retired    Social History Main Topics  . Smoking status: Former Research scientist (life sciences)  . Smokeless tobacco: Never Used     Comment: Lives with spouse-independant in all ADLs. [employer] and [employer] at [employer], Married in "52"  . Alcohol Use: No  . Drug Use: No  . Sexual Activity: Yes   Other Topics Concern  . Not on file   Social History Narrative   Patient occasionally drinks tea.   Patient is right handed.      PHYSICAL EXAM  Filed Vitals:   05/19/15 1432  BP: 140/78  Pulse: 72  Height: 5\' 9"  (1.753 m)  Weight: 163 lb 8 oz (74.163 kg)   Body mass index is 24.13 kg/(m^2).  Generalized: Well developed, in no acute distress   Neurological examination  Mentation: Alert oriented to time, place, history taking. Follows all commands speech and language fluent Cranial nerve II-XII: Pupils were equal round reactive to light. Extraocular movements were full, visual field were full on confrontational test. Facial sensation and strength were normal. Uvula tongue midline. Head turning and shoulder shrug  were normal and symmetric. Motor: The motor testing reveals 5 over 5 strength of all 4 extremities. Good symmetric motor tone is noted throughout.  Sensory: Sensory testing is intact to soft touch on all 4 extremities. No evidence of extinction is noted.  Coordination: Cerebellar testing reveals good finger-nose-finger and heel-to-shin bilaterally.  Gait and station: Gait is normal. Tandem gait is normal. Romberg is negative. No drift is seen.  Reflexes: Deep tendon reflexes are symmetric and normal bilaterally.   DIAGNOSTIC DATA (LABS, IMAGING, TESTING) - I reviewed patient records, labs, notes, testing and imaging myself where available.  Lab Results  Component Value Date   WBC 7.3 12/22/2014   HGB 13.0 12/22/2014   HCT 37.2* 12/22/2014   MCV  93.5 12/22/2014   PLT 158 12/22/2014      Component Value Date/Time   NA 133* 12/22/2014 0140   K 4.8 12/22/2014 0140   CL 101 12/22/2014 0140   CO2 24 12/22/2014 0140   GLUCOSE 131* 12/22/2014 0140   BUN 20 12/22/2014 0140   CREATININE 1.25* 12/22/2014 0140   CREATININE 1.10 08/14/2013 1140   CALCIUM 8.6* 12/22/2014 0140   PROT 6.3 03/26/2015 0947   ALBUMIN 3.7 03/26/2015 0947   AST 18 03/26/2015 0947   ALT 11 03/26/2015 0947   ALKPHOS 84 03/26/2015 0947   BILITOT 0.7 03/26/2015 0947   GFRNONAA 51* 12/22/2014 0140   GFRAA 60* 12/22/2014 0140   Lab Results  Component Value Date   CHOL 126 03/26/2015   HDL 39* 03/26/2015   LDLCALC 70 03/26/2015   TRIG 85 03/26/2015   CHOLHDL 3.2 03/26/2015  ASSESSMENT AND PLAN 82 y.o. year old male  has a past medical history of ACNE ROSACEA (09/09/2009); ALLERGIC RHINITIS (02/23/2009); COLONIC POLYPS, HX OF (02/23/2009); DIVERTICULITIS, HX OF (02/23/2009); GERD (02/23/2009); HYPERTENSION (02/23/2009); URINARY INCONTINENCE (02/23/2009); PONV (postoperative nausea and vomiting); Thoracic aortic aneurysm ([place]); Hyperlipidemia; Family history of heart disease; Pre-syncope; and Convulsions/seizures ([place]) (10/24/2014). here with:  1. Seizures  Overall the patient is doing well. He will continue on Keppra 500 mg twice a day. The patient had multiple questions in regards to his seizures and his medication. All his questions were answered. Patient advised that if he remains seizure free till February he can resume driving. Patient should let us know if he has any additional seizure events. He will follow-up in 3 months or sooner if needed.  I spent 25 minutes with the patient. 50% of this time was spent answering his questions in regards to his medication and explaining his diagnosis.    Ward Givens, MSN, NP-C 05/19/2015, 2:46 PM [facility] [address], [address] [place], [place] [address] [phone]

## 2015-05-19 NOTE — Patient Instructions (Signed)
Continue Keppra 500 mg twice a day You may resume driving in February if you remain seizure free.  If your symptoms worsen or you develop new symptoms please let us know.

## 2015-05-19 NOTE — Progress Notes (Signed)
I have read the note, and I agree with the clinical assessment and plan.  WILLIS,CHARLES KEITH   

## 2015-05-20 ENCOUNTER — Telehealth: Payer: Self-pay | Admitting: Adult Health

## 2015-05-20 NOTE — Telephone Encounter (Signed)
Patient is calling. He was seen in our office yesterday and noticed Dr. Deloria Lair was not on his Care Team Providers list. The patient would like to add Dr. Zadie Rhine to this list. He says he has called before to add him but has not been done. Thank you.

## 2015-05-20 NOTE — Telephone Encounter (Signed)
Done.  Zachary Padilla to pt and relayed that added this. He verbalized understanding.

## 2015-06-22 ENCOUNTER — Other Ambulatory Visit: Payer: Self-pay | Admitting: *Deleted

## 2015-06-22 MED ORDER — LOSARTAN POTASSIUM 50 MG PO TABS: 50.0000 mg | ORAL_TABLET | Freq: Every day | ORAL | Status: DC

## 2015-07-21 ENCOUNTER — Other Ambulatory Visit: Payer: Self-pay | Admitting: *Deleted

## 2015-07-21 MED ORDER — LOSARTAN POTASSIUM 50 MG PO TABS: 50.0000 mg | ORAL_TABLET | Freq: Every day | ORAL | Status: DC

## 2015-07-23 ENCOUNTER — Other Ambulatory Visit: Payer: Self-pay | Admitting: Otolaryngology

## 2015-07-23 DIAGNOSIS — H66002 Acute suppurative otitis media without spontaneous rupture of ear drum, left ear: Secondary | ICD-10-CM

## 2015-07-30 ENCOUNTER — Ambulatory Visit
Admission: RE | Admit: 2015-07-30 | Discharge: 2015-07-30 | Disposition: A | Discharge status: Home / Self Care | Payer: Medicare Other | Source: Ambulatory Visit | Attending: Otolaryngology | Admitting: Otolaryngology

## 2015-07-30 DIAGNOSIS — H66002 Acute suppurative otitis media without spontaneous rupture of ear drum, left ear: Secondary | ICD-10-CM

## 2015-07-30 MED ORDER — IOPAMIDOL (ISOVUE-300) INJECTION 61%
75.0000 mL | Freq: Once | INTRAVENOUS | Status: AC | PRN
  Administered 2015-07-30: 75 mL via INTRAVENOUS

## 2015-07-31 ENCOUNTER — Other Ambulatory Visit: Payer: Self-pay | Admitting: Neurology

## 2015-08-05 ENCOUNTER — Other Ambulatory Visit: Payer: Self-pay | Admitting: Otolaryngology

## 2015-08-05 DIAGNOSIS — H7092 Unspecified mastoiditis, left ear: Secondary | ICD-10-CM

## 2015-08-12 ENCOUNTER — Ambulatory Visit
Admission: RE | Admit: 2015-08-12 | Discharge: 2015-08-12 | Disposition: A | Discharge status: Home / Self Care | Payer: Medicare Other | Source: Ambulatory Visit | Attending: Otolaryngology | Admitting: Otolaryngology

## 2015-08-12 DIAGNOSIS — H7092 Unspecified mastoiditis, left ear: Secondary | ICD-10-CM

## 2015-08-12 MED ORDER — GADOBENATE DIMEGLUMINE 529 MG/ML IV SOLN
15.0000 mL | Freq: Once | INTRAVENOUS | Status: AC | PRN
  Administered 2015-08-12: 15 mL via INTRAVENOUS

## 2015-08-18 ENCOUNTER — Ambulatory Visit: Payer: Medicare Other | Admitting: Adult Health

## 2015-08-19 ENCOUNTER — Ambulatory Visit (INDEPENDENT_AMBULATORY_CARE_PROVIDER_SITE_OTHER): Payer: Medicare Other | Admitting: Adult Health

## 2015-08-19 ENCOUNTER — Encounter: Payer: Self-pay | Admitting: Adult Health

## 2015-08-19 VITALS — BP 124/72 | HR 68 | Ht 69.0 in | Wt 162.6 lb

## 2015-08-19 DIAGNOSIS — G40909 Epilepsy, unspecified, not intractable, without status epilepticus: Secondary | ICD-10-CM | POA: Diagnosis not present

## 2015-08-19 DIAGNOSIS — R569 Unspecified convulsions: Secondary | ICD-10-CM

## 2015-08-19 NOTE — Progress Notes (Signed)
PATIENT: Zachary Padilla DOB: 1928-10-21  REASON FOR VISIT: follow up- nocturnal seizures HISTORY FROM: patient  HISTORY OF PRESENT ILLNESS: Zachary Padilla is an 82 year old male with a history of nocturnal seizures. He returns today for follow-up. He has continued on Keppra 500 mg twice a day. He reports that he is tolerating this medication well. Although he does feel that he may have some adverse events from the [place]. He mentions that his blood pressure has increased although it is in normal range today. He states that he also has noticed some visual changes but is unable to completely describe this. He states that this may be due to to his age and plans to follow-up with his ophthalmologist. He denies any seizure events. He does report that he's been having ear infections. He recently had an MRI with his ENT doctor that showed enhancement along the left tympanic membrane  in the left middle ear cavity compatible with focal inflammation. He has a follow-up appointment this month. The patient lives at home with his wife. He is able to complete all ADLs independently. He operates a Teacher, music without difficulty. He returns today for an evaluation.  HISTORY 05/19/15: Zachary Padilla is an 82 year old male with a history of nocturnal seizures. He returns today for follow-up. The patient remains on Keppra 500 mg twice a day. The patient reports that initially he noticed some drowsiness with the Keppra however that has resolved. His wife reports a slight change in his personality but nothing significant. The patient states that when he had his first seizure in June he was under a lot of stress trying to sell his brother's estate. He is curious if this had any effect on his seizure. The patient states that he has not had any additional seizure events. He does not operate a motor vehicle. He is able to complete all ADLs and apparently. He denies any new neurological symptoms. He returns today for an  evaluation.  HISTORY 01/13/15 (WILLIS): Zachary Padilla is an 82 year old right-handed [place] male with a history of a nocturnal seizure that occurred on 10/19/2014. The patient was seen and evaluated for this, he underwent MRI evaluation of the brain that did not show any acute disease. The patient had an EEG study that was normal. He was not treated with any anti-epileptic medications initially, but he had a recurring nocturnal seizure on 12/22/2014. The patient was placed on Keppra, he returns to this office for an evaluation. He currently is on 250 mg twice daily. He is not operating motor vehicle at this time. The wife indicates that he does snore, but not severely. He does report some fatigue issues, but no significant excessive daytime drowsiness.   REVIEW OF SYSTEMS: Out of a complete 14 system review of symptoms, the patient complains only of the following symptoms, and all other reviewed systems are negative.  See history of present illness  ALLERGIES: Allergies  Allergen Reactions  . Ace Inhibitors Other (See Comments)    unknown  . Ciprodex [Ciprofloxacin-Dexamethasone] Other (See Comments)    unknown  . Erythromycin Nausea And Vomiting  . Levofloxacin Other (See Comments)    dehydration  . Penicillins Nausea And Vomiting  . Sulfonamide Derivatives Nausea And Vomiting    HOME MEDICATIONS: Outpatient Prescriptions Prior to Visit  Medication Sig Dispense Refill  . aspirin EC 81 MG tablet Take 81 mg by mouth daily.    . Azelaic Acid (FINACEA) 15 % cream Apply 1 application topically 2 (two) times daily as  needed. For Roscacea    . guaiFENesin (MUCINEX) 600 MG 12 hr tablet Take 600 mg by mouth 2 (two) times daily as needed.    . levETIRAcetam (KEPPRA) 500 MG tablet TAKE ONE TABLET TWICE DAILY 180 tablet 4  . losartan (COZAAR) 50 MG tablet Take 1 tablet (50 mg total) by mouth daily. 90 tablet 3  . Probiotic Product (ALIGN PO) Take by mouth daily.     No facility-administered  medications prior to visit.    PAST MEDICAL HISTORY: Past Medical History  Diagnosis Date  . 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 ALLERGIC RHINITIS 02/23/2009  . COLONIC POLYPS, HX OF 02/23/2009  . DIVERTICULITIS, HX OF 02/23/2009  . GERD 02/23/2009  . HYPERTENSION 02/23/2009  . URINARY INCONTINENCE 02/23/2009  . PONV (postoperative nausea and vomiting)   . Thoracic aortic aneurysm ([place])   . Hyperlipidemia   . Family history of heart disease   . Pre-syncope   . Convulsions/seizures ([place]) 10/24/2014    PAST SURGICAL HISTORY: Past Surgical History  Procedure Laterality Date  . Appendectomy  1946  . Sinus surgery  2008  . Removal of pre-cancer bump on neck  2005  . Left ear tube  2001  . Ruptured appendics  1946  . Cataract extraction Bilateral     FAMILY HISTORY: Family History  Problem Relation Age of Onset  . Heart disease Other   . Stroke Other   . Heart disease Mother   . Stroke Father   . Heart disease Father   . Cancer Sister   . Cancer Brother     SOCIAL HISTORY: Social History   Social History  . Marital Status: Married    Spouse Name: N/A  . Number of Children: 2  . Years of Education: 15   Occupational History  . retired    Social History Main Topics  . Smoking status: Former Research scientist (life sciences)  . Smokeless tobacco: Never Used     Comment: Lives with spouse-independant in all ADLs. [employer] and [employer] at [employer], Married in "52"  . Alcohol Use: No  . Drug Use: No  . Sexual Activity: Yes   Other Topics Concern  . Not on file   Social History Narrative   Patient occasionally drinks tea.   Patient is right handed.      PHYSICAL EXAM  Filed Vitals:   08/19/15 1412  BP: 124/72  Pulse: 68  Height: 5\' 9"  (1.753 m)  Weight: 162 lb 9.6 oz (73.755 kg)   Body mass index is 24 kg/(m^2).  Generalized: Well developed, in no acute distress   Neurological examination  Mentation: Alert oriented to time, place, history taking. Follows all  commands speech and language fluent Cranial nerve II-XII: Pupils were equal round reactive to light. Extraocular movements were full, visual field were full on confrontational test. Facial sensation and strength were normal. Uvula tongue midline. Head turning and shoulder shrug  were normal and symmetric. Motor: The motor testing reveals 5 over 5 strength of all 4 extremities. Good symmetric motor tone is noted throughout.  Sensory: Sensory testing is intact to soft touch on all 4 extremities. No evidence of extinction is noted.  Coordination: Cerebellar testing reveals good finger-nose-finger and heel-to-shin bilaterally.  Gait and station: Gait is normal. Tandem gait is Slightly unsteady. Romberg is negative. No drift is seen.  Reflexes: Deep tendon reflexes are symmetric and normal bilaterally.   DIAGNOSTIC DATA (LABS, IMAGING, TESTING) - I reviewed patient records, labs, notes, testing and imaging  myself where available.  Lab Results  Component Value Date   WBC 7.3 12/22/2014   HGB 13.0 12/22/2014   HCT 37.2* 12/22/2014   MCV 93.5 12/22/2014   PLT 158 12/22/2014      Component Value Date/Time   NA 133* 12/22/2014 0140   K 4.8 12/22/2014 0140   CL 101 12/22/2014 0140   CO2 24 12/22/2014 0140   GLUCOSE 131* 12/22/2014 0140   BUN 20 12/22/2014 0140   CREATININE 1.25* 12/22/2014 0140   CREATININE 1.10 08/14/2013 1140   CALCIUM 8.6* 12/22/2014 0140   PROT 6.3 03/26/2015 0947   ALBUMIN 3.7 03/26/2015 0947   AST 18 03/26/2015 0947   ALT 11 03/26/2015 0947   ALKPHOS 84 03/26/2015 0947   BILITOT 0.7 03/26/2015 0947   GFRNONAA 93* 12/22/2014 0140   GFRAA 60* 12/22/2014 0140      ASSESSMENT AND PLAN 82 y.o. year old male  has a past medical history of ACNE ROSACEA (09/09/2009); ALLERGIC RHINITIS (02/23/2009); COLONIC POLYPS, HX OF (02/23/2009); DIVERTICULITIS, HX OF (02/23/2009); GERD (02/23/2009); HYPERTENSION (02/23/2009); URINARY INCONTINENCE (02/23/2009); PONV (postoperative  nausea and vomiting); Thoracic aortic aneurysm ([place]); Hyperlipidemia; Family history of heart disease; Pre-syncope; and Convulsions/seizures ([place]) (10/24/2014). here with:  1. Nocturnal seizures  The patient has not had any recent seizure events. He will continue on Keppra 500 mg twice a day. Patient advised that if he has any seizure events he should let us know. He will follow-up in 6 months with Dr. Jannifer Franklin or sooner if needed.  Ward Givens, MSN, NP-C 08/19/2015, 2:17 PM [facility] [address], [place], [place] [address] [phone]

## 2015-08-19 NOTE — Progress Notes (Signed)
I have read the note, and I agree with the clinical assessment and plan.  WILLIS,CHARLES KEITH   

## 2015-08-19 NOTE — Patient Instructions (Signed)
Continue Keppra.  If your symptoms worsen or you develop new symptoms please let us know.   

## 2015-08-25 ENCOUNTER — Encounter: Payer: Self-pay | Admitting: Internal Medicine

## 2015-08-25 DIAGNOSIS — H7010 Chronic mastoiditis, unspecified ear: Secondary | ICD-10-CM | POA: Insufficient documentation

## 2015-08-25 DIAGNOSIS — H6690 Otitis media, unspecified, unspecified ear: Secondary | ICD-10-CM | POA: Insufficient documentation

## 2015-08-25 DIAGNOSIS — H905 Unspecified sensorineural hearing loss: Secondary | ICD-10-CM | POA: Insufficient documentation

## 2015-08-26 ENCOUNTER — Ambulatory Visit (INDEPENDENT_AMBULATORY_CARE_PROVIDER_SITE_OTHER): Payer: Medicare Other | Admitting: Internal Medicine

## 2015-08-26 DIAGNOSIS — H7013 Chronic mastoiditis, bilateral: Secondary | ICD-10-CM | POA: Diagnosis not present

## 2015-08-26 DIAGNOSIS — H905 Unspecified sensorineural hearing loss: Secondary | ICD-10-CM

## 2015-08-26 DIAGNOSIS — H6530 Chronic mucoid otitis media, unspecified ear: Secondary | ICD-10-CM

## 2015-08-26 NOTE — Progress Notes (Signed)
[place] for Infectious Disease  Reason for Consult: Chronic otitis media Referring Physician: Dr. Vicie Mutters  Patient Active Problem List   Diagnosis Date Noted  . 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    Priority: High  . Chronic otitis media 08/25/2015    Priority: High  . Sensorineural hearing loss 08/25/2015    Priority: Medium  . Convulsions/seizures ([place]) 10/24/2014    Priority: Medium  . Hyperlipidemia 02/13/2013  . Thoracic aortic aneurysm ([place]) 02/13/2013  . Right bundle branch block 02/13/2013  . Nonspecific abnormal electrocardiogram (ECG) (EKG) 01/02/2012  . 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 Essential hypertension 02/23/2009  . ALLERGIC RHINITIS 02/23/2009  . GERD 02/23/2009  . URINARY INCONTINENCE 02/23/2009  . COLONIC POLYPS, HX OF 02/23/2009  . DIVERTICULITIS, HX OF 02/23/2009    Patient's Medications  New Prescriptions   No medications on file  Previous Medications   ASPIRIN EC 81 MG TABLET    Take 81 mg by mouth daily.   AZELAIC ACID (FINACEA) 15 % CREAM    Apply 1 application topically 2 (two) times daily as needed. For Roscacea   LEVETIRACETAM (KEPPRA) 500 MG TABLET    TAKE ONE TABLET TWICE DAILY   LOSARTAN (COZAAR) 50 MG TABLET    Take 1 tablet (50 mg total) by mouth daily.   PROBIOTIC PRODUCT (ALIGN PO)    Take by mouth daily.   UNKNOWN TO PATIENT    [facility]  Modified Medications   No medications on file  Discontinued Medications   No medications on file    Recommendations: 1. Continue meropenem eardrops for now 2. Investigate history of antibiotic allergies 3. Follow-up in 4 weeks   Assessment: Zachary Padilla has chronic left otitis media possibly due to Pseudomonas. I do not think that there is any convincing evidence that he has had intracranial extension causing complications or that his infection is the cause of his possible seizures last year. Currently he is doing much better on his current regimen of meropenem  eardrops. It is unclear to me if his many listed problems with antibiotics are true allergies. My infectious disease pharmacist, Onnie Boer, called his [facility]). They did not have a comprehensive list of medication allergies and have not recently filled any antibiotic prescriptions. I talked to him about other options for managing possible Pseudomonas infection including a trial of ciprofloxacin or intravenous antibiotics with ceftazidime, cefepime or carbapenems. He is very reluctant to consider intravenous antibiotics, especially since he is feeling better. I asked him to follow-up with me in 4 weeks.   HPI: Zachary Padilla is a 82 y.o. male who states that he has had respiratory problems for "most of my life". He has had recurrent ear infections, sinusitis and asthma. He has had previous sinus surgery. Over the past several years he has had problems with intermittent drainage from his left ear. He has had previous tympanostomy tubes in that ear. He has also been bothered by purulent nasal congestion, hoarseness and hearing loss.   In November of 2015 he had an episode of vertigo and was seen in the emergency department. In June 2016 his wife had difficulty arousing him from sleep. He was evaluated by Dr. Floyde Parkins and started on therapy for possible nocturnal seizures. In March of this year he had a CT scan of the temporal bones with contrast. This showed thickening of the left tympanic membrane with scattered globular and synechiae type opacity throughout the  left tympanic cavity. There was also chronic left mastoid sclerosis and coalescence with mild to moderate mastoid opacification appearing similar to a 2016 brain MRI. Bony thinning of the left tegmen was not significantly different from that on the right. Brain MRI done on 08/12/2015 showed enhancement along the left tympanic membrane in the left middle ear cavity compatible with focal inflammation. There was no evidence of intracranial  extension.  He has a history of numerous antibiotic intolerances/allergies including sulfa antibiotics, quinolones, macrolides, aminoglycosides and penicillin. When asked about specific reactions he states that at his age he has trouble remembering what the reactions were. He believes he may be able to tolerate ampicillin and amoxicillin. He does not recall specific problems with macrolide antibiotics. He also believes that he may tolerate ciprofloxacin. Many years ago following sinus surgery he developed severe diarrhea when taking levofloxacin and thinks that may have been what caused him to be listed as allergic to quinolone antibiotics. Most recently Dr. Thornell Mule has treated him with compounded meropenem eardrops. He states that over the last few weeks the drainage has ceased and his nasal discharge is beginning to clear up. The last culture of his left ear drainage collected on 07/07/2015 grew 2 species of Pseudomonas aeruginosa. Both isolates were susceptible to quinolone antibiotics, third generation cephalosporins and carbapemems.  He has no history of pneumonia. He is a nonsmoker. He is up-to-date on pneumococcal vaccine and always gets an annual influenza vaccine.  Review of Systems: Review of Systems  Constitutional: Negative for fever, chills, weight loss, malaise/fatigue and diaphoresis.  HENT: Positive for congestion, ear discharge and hearing loss. Negative for ear pain, sore throat and tinnitus.        Hoarseness  Respiratory: Positive for shortness of breath. Negative for cough and sputum production.        Mild, chronic shortness of breath. This is often most noticeable when he lays down to sleep at night.  Cardiovascular: Negative for chest pain.  Gastrointestinal: Negative for heartburn, nausea, vomiting, abdominal pain and diarrhea.  Musculoskeletal: Negative for myalgias and joint pain.  Skin: Negative for rash.  Neurological: Positive for seizures. Negative for dizziness and  headaches.  Psychiatric/Behavioral: Negative for depression and substance abuse. The patient is not nervous/anxious.       Past Medical History  Diagnosis Date  . 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 ALLERGIC RHINITIS 02/23/2009  . COLONIC POLYPS, HX OF 02/23/2009  . DIVERTICULITIS, HX OF 02/23/2009  . GERD 02/23/2009  . HYPERTENSION 02/23/2009  . URINARY INCONTINENCE 02/23/2009  . PONV (postoperative nausea and vomiting)   . Thoracic aortic aneurysm ([place])   . Hyperlipidemia   . Family history of heart disease   . Pre-syncope   . Convulsions/seizures ([place]) 10/24/2014    Social History  Substance Use Topics  . Smoking status: Former Research scientist (life sciences)  . Smokeless tobacco: Never Used     Comment: Lives with spouse-independant in all ADLs. [employer] and [employer] at [employer], Married in "52"  . Alcohol Use: No    Family History  Problem Relation Age of Onset  . Heart disease Other   . Stroke Other   . Heart disease Mother   . Stroke Father   . Heart disease Father   . Cancer Sister   . Cancer Brother    Allergies  Allergen Reactions  . Ace Inhibitors Other (See Comments)    unknown  . Aminoglycosides   . Ciprodex [Ciprofloxacin-Dexamethasone] Other (See Comments)    unknown  .  Erythromycin Nausea And Vomiting  . Levofloxacin Other (See Comments)    dehydration  . Penicillins Nausea And Vomiting  . Sulfa Antibiotics     OBJECTIVE: There were no vitals filed for this visit. There is no weight on file to calculate BMI.   Physical Exam  Constitutional: He is oriented to person, place, and time.  He is very pleasant and in no distress. He is somewhat hard of hearing.  HENT:  Right Ear: External ear normal.  Nose: Nose normal.  Mouth/Throat: No oropharyngeal exudate.  There some scarring of the left tympanic membrane with what appears to be a perforation at the 9:00 position. Little bit of clear fluid appears visible in the perforation. He has bilateral hearing  aids.  Eyes: Conjunctivae are normal.  Cardiovascular: Normal rate and regular rhythm.   No murmur heard. Pulmonary/Chest: Breath sounds normal. He has no wheezes. He has no rales.  Abdominal: Soft. There is no tenderness.  Musculoskeletal: Normal range of motion.  Neurological: He is alert and oriented to person, place, and time.  Skin: No rash noted.  Mild rosacea present.  Psychiatric: Mood and affect normal.    Microbiology: No results found for this or any previous visit (from the past 240 hour(s)).  Michel Bickers, MD [facility] for Infectious [place] [facility] [phone] pager   [phone] cell 08/26/2015, 11:51 AM

## 2015-08-26 NOTE — Patient Instructions (Signed)
Your last ear culture grew Psedomonas

## 2015-08-26 NOTE — Assessment & Plan Note (Signed)
[name] has chronic left otitis media possibly due to Pseudomonas. I do not think that there is any convincing evidence that he has had intracranial extension causing complications or that his infection is the cause of his possible seizures last year. Currently he is doing much better on his current regimen of meropenem eardrops. It is unclear to me if his many listed problems with antibiotics are true allergies. My infectious disease pharmacist, Onnie Boer, called his [facility]). They did not have a comprehensive list of medication allergies and have not recently filled any antibiotic prescriptions. I talked to him about other options for managing possible Pseudomonas infection including a trial of ciprofloxacin or intravenous antibiotics with ceftazidime, cefepime or carbapenems. He is very reluctant to consider intravenous antibiotics, especially since he is feeling better. I asked him to follow-up with me in 4 weeks.

## 2015-09-22 ENCOUNTER — Ambulatory Visit: Payer: Medicare Other | Admitting: Internal Medicine

## 2015-09-22 ENCOUNTER — Ambulatory Visit (INDEPENDENT_AMBULATORY_CARE_PROVIDER_SITE_OTHER): Payer: Medicare Other | Admitting: Internal Medicine

## 2015-09-22 ENCOUNTER — Encounter: Payer: Self-pay | Admitting: Internal Medicine

## 2015-09-22 VITALS — BP 135/72 | HR 61 | Temp 97.7°F | Wt 160.0 lb

## 2015-09-22 DIAGNOSIS — H6522 Chronic serous otitis media, left ear: Secondary | ICD-10-CM | POA: Diagnosis not present

## 2015-09-22 NOTE — Progress Notes (Signed)
[place] [facility] for Infectious Disease  Patient Active Problem List   Diagnosis Date Noted  . 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    Priority: High  . Chronic otitis media 08/25/2015    Priority: High  . Sensorineural hearing loss 08/25/2015    Priority: Medium  . Convulsions/seizures ([place]) 10/24/2014    Priority: Medium  . Hyperlipidemia 02/13/2013  . Thoracic aortic aneurysm ([place]) 02/13/2013  . Right bundle branch block 02/13/2013  . Nonspecific abnormal electrocardiogram (ECG) (EKG) 01/02/2012  . 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 Essential hypertension 02/23/2009  . ALLERGIC RHINITIS 02/23/2009  . GERD 02/23/2009  . URINARY INCONTINENCE 02/23/2009  . COLONIC POLYPS, HX OF 02/23/2009  . DIVERTICULITIS, HX OF 02/23/2009    Patient's Medications  New Prescriptions   No medications on file  Previous Medications   ASPIRIN EC 81 MG TABLET    Take 81 mg by mouth daily.   AZELAIC ACID (FINACEA) 15 % CREAM    Apply 1 application topically 2 (two) times daily as needed. For Roscacea   LEVETIRACETAM (KEPPRA) 500 MG TABLET    TAKE ONE TABLET TWICE DAILY   LOSARTAN (COZAAR) 50 MG TABLET    Take 1 tablet (50 mg total) by mouth daily.   PROBIOTIC PRODUCT (ALIGN PO)    Take by mouth daily.   UNKNOWN TO PATIENT    [facility]  Modified Medications   No medications on file  Discontinued Medications   No medications on file    Subjective: Zachary Padilla is in for his routine followup visit. He continues to feel like he has improved over the past month. He had a very small amount of drainage from his left ear yesterday but otherwise the drainage has been markedly improved. He saw Dr. Thornell Mule last week and was told that his ear was looking much better. Yesterday he switched from meropenem eardrops to fluocinonide drops. He has had some fleeting bilateral headaches which are unchanged.  Review of Systems: Review of Systems  Constitutional: Negative for fever, chills, weight  loss, malaise/fatigue and diaphoresis.  HENT: Positive for congestion and ear discharge. Negative for ear pain and sore throat.   Respiratory: Positive for shortness of breath. Negative for cough and sputum production.   Cardiovascular: Negative for chest pain.  Skin: Negative for rash.  Neurological: Positive for headaches.    Past Medical History  Diagnosis Date  . 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 ALLERGIC RHINITIS 02/23/2009  . COLONIC POLYPS, HX OF 02/23/2009  . DIVERTICULITIS, HX OF 02/23/2009  . GERD 02/23/2009  . HYPERTENSION 02/23/2009  . URINARY INCONTINENCE 02/23/2009  . PONV (postoperative nausea and vomiting)   . Thoracic aortic aneurysm ([place])   . Hyperlipidemia   . Family history of heart disease   . Pre-syncope   . Convulsions/seizures ([place]) 10/24/2014    Social History  Substance Use Topics  . Smoking status: Former Research scientist (life sciences)  . Smokeless tobacco: Never Used     Comment: Lives with spouse-independant in all ADLs. [employer] and [employer] at [employer], Married in "52"  . Alcohol Use: No    Family History  Problem Relation Age of Onset  . Heart disease Other   . Stroke Other   . Heart disease Mother   . Stroke Father   . Heart disease Father   . Cancer Sister   . Cancer Brother     Allergies  Allergen Reactions  . Ace Inhibitors Other (See Comments)  unknown  . Aminoglycosides   . Ciprodex [Ciprofloxacin-Dexamethasone] Other (See Comments)    unknown  . Erythromycin Nausea And Vomiting  . Levofloxacin Other (See Comments)    dehydration  . Penicillins Nausea And Vomiting  . Sulfa Antibiotics     Objective: Filed Vitals:   09/22/15 1151  BP: 135/72  Pulse: 61  Temp: 97.7 F (36.5 C)  TempSrc: Oral  Weight: 160 lb (72.576 kg)   Body mass index is 23.62 kg/(m^2).  Physical Exam  Constitutional:  He is very pleasant and in no distress. He remains slightly hoarse.  HENT:  Mouth/Throat: No oropharyngeal exudate.  There is  slight redness of his left tympanic membrane with a chronic perforation. There is no obvious drainage in the external canal.  Pulmonary/Chest: Breath sounds normal. He has no wheezes. He has no rales.    Lab Results    Problem List Items Addressed This Visit      High   Chronic otitis media - Primary    His left ear drainage has improved over the past month. I talked to him again about his history of multiple antibiotic allergies. I do not believe that he has a true fluoroquinolone allergy. It sounds like he developed diarrhea and dehydration after taking ciprofloxacin and levofloxacin many years ago. If his left ear drainage were to increase and he continued to isolate pseudomonas on culture it would be reasonable to consider a trial of oral ciprofloxacin if his Isolette remains susceptible. For now he will continue his fluocinonide ear drops and followup with me in 4-6 weeks.          Michel Bickers, MD [facility] for Infectious [place] [facility] [phone] pager   [phone] cell 09/22/2015, 3:11 PM

## 2015-09-22 NOTE — Assessment & Plan Note (Signed)
His left ear drainage has improved over the past month. I talked to him again about his history of multiple antibiotic allergies. I do not believe that he has a true fluoroquinolone allergy. It sounds like he developed diarrhea and dehydration after taking ciprofloxacin and levofloxacin many years ago. If his left ear drainage were to increase and he continued to isolate pseudomonas on culture it would be reasonable to consider a trial of oral ciprofloxacin if his Isolette remains susceptible. For now he will continue his fluocinonide ear drops and followup with me in 4-6 weeks.

## 2015-09-29 ENCOUNTER — Other Ambulatory Visit: Payer: Self-pay

## 2015-09-29 DIAGNOSIS — Z79899 Other long term (current) drug therapy: Secondary | ICD-10-CM

## 2015-09-30 ENCOUNTER — Telehealth: Payer: Self-pay | Admitting: Adult Health

## 2015-09-30 NOTE — Telephone Encounter (Signed)
I spoke to pt and he picked up generic keppra from [facility].  Was not informed of change of tablet.  He spoke to pharmacist and was told that supplier had changed generic.  Pt was concerned.  I told him that this did happen with generic drugs.  Even though told by pharmacist, he was still anxious about this.  Was one hour late in taking medication.  I told him to confirm again that this was generic keppra.  See if could get the older brand at another pharmacy (affiliated with [facility]).  He is okay to take an hour later and then take his second dose 12 hours later.  He would call pharmacy.

## 2015-09-30 NOTE — Telephone Encounter (Signed)
Patient is calling. He picked up medication levETIRAcetam (KEPPRA) 500 MG tablet at the pharmacy and the pill was a different color. He asked the pharmacist and was told they must have changed suppliers.. The patient was concerned and wanted to discuss with our office. The patient would like a call back because he needs to take this medication soon. The patient can be reached at [phone].

## 2015-10-12 ENCOUNTER — Other Ambulatory Visit: Payer: Medicare Other

## 2015-10-13 ENCOUNTER — Other Ambulatory Visit: Payer: Self-pay

## 2015-10-13 MED ORDER — LOSARTAN POTASSIUM 50 MG PO TABS: 50.0000 mg | ORAL_TABLET | Freq: Every day | ORAL | Status: DC

## 2015-10-30 ENCOUNTER — Telehealth: Payer: Self-pay | Admitting: Neurology

## 2015-10-30 ENCOUNTER — Telehealth: Payer: Self-pay | Admitting: *Deleted

## 2015-10-30 NOTE — Telephone Encounter (Signed)
Returned TC and spoke to pt. He reports that Cipro 500 mg BID x 10 days was prescribed for a "stubborn ear infection" along w/ ear drops. Let him know that some antibiotics, such as Cipro, can lessen the effects of anti-seizure medications. Will check w/ Dr. Jannifer Franklin and call pt back w/ medication instructions.

## 2015-10-30 NOTE — Telephone Encounter (Signed)
Patient called and left voice mail stating "would like Dr. Hale Bogus opinion on my condition." Called back at number provided no answer. Called cell # and no answer and no voice mail set up. He has an appointment on Tuesday 11/03/15. Myrtis Hopping

## 2015-10-30 NOTE — Telephone Encounter (Signed)
Patient called to inquire if okay to take Cipro together with levETIRAcetam (KEPPRA) 500 MG tablet

## 2015-10-30 NOTE — Telephone Encounter (Signed)
I called patient. There is no direct interaction between Cipro and the Keppra. The Cipro may lower the seizure threshold some, the patient also has a potential allergy to medication. I discussed this with him.

## 2015-11-02 ENCOUNTER — Telehealth: Payer: Self-pay | Admitting: *Deleted

## 2015-11-02 NOTE — Telephone Encounter (Signed)
Patient called wanting to know if he should keep his appt tomorrow or move it further out. He was seen by the ENT last week and his antibiotic was changed to cipro and he was also given ear drops. Please advise

## 2015-11-03 ENCOUNTER — Ambulatory Visit: Payer: Medicare Other | Admitting: Internal Medicine

## 2015-11-03 ENCOUNTER — Ambulatory Visit (INDEPENDENT_AMBULATORY_CARE_PROVIDER_SITE_OTHER): Payer: Medicare Other | Admitting: Internal Medicine

## 2015-11-03 ENCOUNTER — Encounter: Payer: Self-pay | Admitting: Internal Medicine

## 2015-11-03 VITALS — BP 105/70 | HR 73 | Temp 97.4°F | Wt 157.0 lb

## 2015-11-03 DIAGNOSIS — H663X2 Other chronic suppurative otitis media, left ear: Secondary | ICD-10-CM | POA: Diagnosis not present

## 2015-11-03 NOTE — Assessment & Plan Note (Signed)
He has had significant symptomatic improvement with ciprofloxacin eardrops. He may have had an early, mild allergic reaction to oral ciprofloxacin with rash and itching. He states that he wants to take a second dose to be absolutely sure. I told him that this is probably safe to do. I asked him to call me after he takes a second dose to let me know if the rash recurs. I do not see any indication to start intravenous antibiotics. It will be helpful to get the results of recent cultures from Dr. Thornell Mule.

## 2015-11-03 NOTE — Telephone Encounter (Signed)
Called patient and got voice mail; advised ok to reschedule per Dr. Megan Salon.

## 2015-11-03 NOTE — Progress Notes (Signed)
[place] for Infectious Disease  Patient Active Problem List   Diagnosis Date Noted  . 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    Priority: High  . Chronic otitis media 08/25/2015    Priority: High  . Sensorineural hearing loss 08/25/2015    Priority: Medium  . Convulsions/seizures ([place]) 10/24/2014    Priority: Medium  . Hyperlipidemia 02/13/2013  . Thoracic aortic aneurysm ([place]) 02/13/2013  . Right bundle branch block 02/13/2013  . Nonspecific abnormal electrocardiogram (ECG) (EKG) 01/02/2012  . 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 Essential hypertension 02/23/2009  . ALLERGIC RHINITIS 02/23/2009  . GERD 02/23/2009  . URINARY INCONTINENCE 02/23/2009  . COLONIC POLYPS, HX OF 02/23/2009  . DIVERTICULITIS, HX OF 02/23/2009    Patient's Medications  New Prescriptions   No medications on file  Previous Medications   ASPIRIN EC 81 MG TABLET    Take 81 mg by mouth daily. Reported on 11/03/2015   AZELAIC ACID (FINACEA) 15 % CREAM    Apply 1 application topically 2 (two) times daily as needed. For Roscacea   CIPRODEX OTIC SUSPENSION       CIPROFLOXACIN (CIPRO) 500 MG TABLET    Reported on 11/03/2015   LEVETIRACETAM (KEPPRA) 500 MG TABLET    TAKE ONE TABLET TWICE DAILY   LOSARTAN (COZAAR) 50 MG TABLET    Take 1 tablet (50 mg total) by mouth daily.   PROBIOTIC PRODUCT (ALIGN PO)    Take by mouth daily.   UNKNOWN TO PATIENT    Reported on 11/03/2015  Modified Medications   No medications on file  Discontinued Medications   No medications on file    Subjective: [name] is in for his routine follow-up visit. He has continued to have intermittent problems with itching and drainage from his left ear. He saw Dr. Thornell Mule recently and had repeat specimens obtained for culture. He was started on ciprofloxacin eardrops one week ago and has noted significant improvement. He was also supposed to start on oral ciprofloxacin but he waited until some stomach upset subsided. He took his first dose  yesterday and developed a red splotchy and pruritic rash on his lower legs and forearms. He decided not to take a second dose but is not entirely sure that it was an allergic reaction. He is feeling much better today with only very mild itching.  Review of Systems: Review of Systems  HENT:       As noted in history of present illness.  Skin: Positive for itching and rash.    Past Medical History  Diagnosis Date  . 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 ALLERGIC RHINITIS 02/23/2009  . COLONIC POLYPS, HX OF 02/23/2009  . DIVERTICULITIS, HX OF 02/23/2009  . GERD 02/23/2009  . HYPERTENSION 02/23/2009  . URINARY INCONTINENCE 02/23/2009  . PONV (postoperative nausea and vomiting)   . Thoracic aortic aneurysm ([place])   . Hyperlipidemia   . Family history of heart disease   . Pre-syncope   . Convulsions/seizures ([place]) 10/24/2014    Social History  Substance Use Topics  . Smoking status: Former Research scientist (life sciences)  . Smokeless tobacco: Never Used     Comment: Lives with spouse-independant in all ADLs. [employer] and [employer] at [employer], Married in "52"  . Alcohol Use: No    Family History  Problem Relation Age of Onset  . Heart disease Other   . Stroke Other   . Heart disease Mother   . Stroke Father   . Heart  disease Father   . Cancer Sister   . Cancer Brother     Allergies  Allergen Reactions  . Ace Inhibitors Other (See Comments)    unknown  . Aminoglycosides   . Ciprodex [Ciprofloxacin-Dexamethasone] Other (See Comments)    unknown  . Erythromycin Nausea And Vomiting  . Levofloxacin Other (See Comments)    dehydration  . Penicillins Nausea And Vomiting  . Sulfa Antibiotics     Objective: Filed Vitals:   11/03/15 1012  BP: 105/70  Pulse: 73  Temp: 97.4 F (36.3 C)  TempSrc: Oral  Weight: 157 lb (71.215 kg)   Body mass index is 23.17 kg/(m^2).  Physical Exam  Constitutional:  He is alert and in no distress. His voice is somewhat hoarse.  HENT:  He has a  Fangman, tubular wick in his left external ear canal. No drainage is noted.    Lab Results    Problem List Items Addressed This Visit      High   Chronic otitis media - Primary    He has had significant symptomatic improvement with ciprofloxacin eardrops. He may have had an early, mild allergic reaction to oral ciprofloxacin with rash and itching. He states that he wants to take a second dose to be absolutely sure. I told him that this is probably safe to do. I asked him to call me after he takes a second dose to let me know if the rash recurs. I do not see any indication to start intravenous antibiotics. It will be helpful to get the results of recent cultures from Dr. Thornell Mule.      Relevant Medications   ciprofloxacin (CIPRO) 500 MG tablet       Michel Bickers, MD [facility] for Infectious [place] [phone] pager   [phone] cell 11/03/2015, 10:31 AM

## 2015-11-03 NOTE — Telephone Encounter (Signed)
He can reschedule.

## 2015-11-11 ENCOUNTER — Other Ambulatory Visit: Payer: Medicare Other

## 2015-11-13 ENCOUNTER — Telehealth: Payer: Self-pay | Admitting: Cardiovascular Disease

## 2015-11-13 NOTE — Telephone Encounter (Signed)
Called Zachary Padilla - they can fill generic for patient but he is requesting name-brand only Cozaar due to history of allergies w ACE, generic ARBs Would be $100 out of pocket  Called patient, discussed options - he is aware we could potentially try a different med. I raised the possibility of discussing alternatives w pharmD.  Pt wants to try prior authorization first.  I have submitted PA for this med, pt aware. In interim he will pick up 2-3 tablets at pharmacy at cost to get through weekend. Will notify when PA is completed.

## 2015-11-13 NOTE — Telephone Encounter (Signed)
Pt need prior authorization for his Cozaar.. Please call to [facility] ask for Zachary Padilla-[phone]. Pt says he needs his medicine today please.

## 2015-11-16 ENCOUNTER — Other Ambulatory Visit (INDEPENDENT_AMBULATORY_CARE_PROVIDER_SITE_OTHER): Payer: Medicare Other | Admitting: *Deleted

## 2015-11-16 DIAGNOSIS — Z79899 Other long term (current) drug therapy: Secondary | ICD-10-CM | POA: Diagnosis not present

## 2015-11-16 LAB — BASIC METABOLIC PANEL
BUN: 14 mg/dL (ref 7–25)
CALCIUM: 8.2 mg/dL — AB (ref 8.6–10.3)
CO2: 27 mmol/L (ref 20–31)
CREATININE: 1.33 mg/dL — AB (ref 0.70–1.11)
Chloride: 101 mmol/L (ref 98–110)
GLUCOSE: 88 mg/dL (ref 65–99)
Potassium: 4.4 mmol/L (ref 3.5–5.3)
SODIUM: 135 mmol/L (ref 135–146)

## 2015-11-17 ENCOUNTER — Telehealth: Payer: Self-pay | Admitting: Cardiovascular Disease

## 2015-11-17 NOTE — Telephone Encounter (Signed)
Received incoming fax from [facility], the request for Prior Auth was denied for Cozaar.

## 2015-11-17 NOTE — Telephone Encounter (Signed)
New message      Talk to a nurse to get prior authorization for cozaar.  ID number is [id number].  Please call

## 2015-11-18 ENCOUNTER — Ambulatory Visit (INDEPENDENT_AMBULATORY_CARE_PROVIDER_SITE_OTHER)
Admission: RE | Admit: 2015-11-18 | Discharge: 2015-11-18 | Disposition: A | Discharge status: Home / Self Care | Payer: Medicare Other | Source: Ambulatory Visit | Attending: Cardiovascular Disease | Admitting: Cardiovascular Disease

## 2015-11-18 DIAGNOSIS — I712 Thoracic aortic aneurysm, without rupture, unspecified: Secondary | ICD-10-CM

## 2015-11-18 MED ORDER — IOPAMIDOL (ISOVUE-370) INJECTION 76%
100.0000 mL | Freq: Once | INTRAVENOUS | Status: AC | PRN
  Administered 2015-11-18: 100 mL via INTRAVENOUS

## 2015-11-18 NOTE — Telephone Encounter (Signed)
Returned call to [employer]. Spoke with representative. The request for PA of Cozaar as a nonformulary was approved as patient cannot take generic ARB's. Approval number is [id number] and copay will be $44.10. Approved period is from 10/19/2015 to 11/17/2016.

## 2015-11-18 NOTE — Telephone Encounter (Signed)
Called patient and let him know the approval for Cozaar is completed. As noted in comments below.    Vanessa Ralphs, RN at 11/18/2015 11:02 AM     Status: Signed       Expand All Collapse All   Returned call to [employer]. Spoke with representative. The request for PA of Cozaar as a nonformulary was approved as patient cannot take generic ARB's. Approval number is [id number] and copay will be $44.10. Approved period is from 10/19/2015 to 11/17/2016.

## 2015-11-25 ENCOUNTER — Telehealth: Payer: Self-pay | Admitting: Cardiovascular Disease

## 2015-11-25 NOTE — Telephone Encounter (Signed)
Returning call about test results

## 2015-11-25 NOTE — Telephone Encounter (Signed)
Results called to pt. Pt verbalized understanding. Patient asked when his next ROV is to be. Due in November. Appt scheduled for pt in Nov 2017.

## 2015-12-08 ENCOUNTER — Ambulatory Visit: Payer: Medicare Other | Admitting: Internal Medicine

## 2016-01-22 ENCOUNTER — Encounter: Payer: Self-pay | Admitting: Student

## 2016-02-25 ENCOUNTER — Ambulatory Visit (INDEPENDENT_AMBULATORY_CARE_PROVIDER_SITE_OTHER): Payer: Medicare Other | Admitting: Neurology

## 2016-02-25 ENCOUNTER — Encounter: Payer: Self-pay | Admitting: Neurology

## 2016-02-25 VITALS — BP 129/72 | HR 57 | Ht 69.0 in | Wt 161.5 lb

## 2016-02-25 DIAGNOSIS — R569 Unspecified convulsions: Secondary | ICD-10-CM

## 2016-02-25 MED ORDER — LEVETIRACETAM 500 MG PO TABS: ORAL_TABLET | ORAL | Status: DC

## 2016-02-25 NOTE — Progress Notes (Signed)
Reason for visit: Seizures  Zachary Padilla is an 82 y.o. male  History of present illness:  Zachary Padilla is an 82 year old right-handed [name] with a history of 2 nocturnal seizure-type events witnessed by his wife. The patient has not had any events on Keppra, he does report some mild drowsiness during the day. He has been dealing with a Pseudomonas infection of the middle ear, followed by Dr. Thornell Mule for this. The patient has had episodes of vertigo in the past as well. He has not had any recent issues. He denies any further seizure-type events on the Keppra, he is operating a motor vehicle without difficulty at this time. He comes back to this office for an evaluation.  Past Medical History:  Diagnosis Date  . 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 ALLERGIC RHINITIS 02/23/2009  . COLONIC POLYPS, HX OF 02/23/2009  . Convulsions/seizures ([place]) 10/24/2014  . DIVERTICULITIS, HX OF 02/23/2009  . Ear infection   . Family history of heart disease   . GERD 02/23/2009  . Hyperlipidemia   . HYPERTENSION 02/23/2009  . PONV (postoperative nausea and vomiting)   . Pre-syncope   . Thoracic aortic aneurysm ([place])   . URINARY INCONTINENCE 02/23/2009    Past Surgical History:  Procedure Laterality Date  . APPENDECTOMY  1946  . CATARACT EXTRACTION Bilateral   . Left ear tube  2001  . Removal of pre-cancer bump on neck  2005  . Ruptured appendics  1946  . Sinus surgery  2008    Family History  Problem Relation Age of Onset  . Heart disease Mother   . Stroke Father   . Heart disease Father   . Cancer Sister   . Cancer Brother   . Heart disease Other   . Stroke Other     Social history:  reports that he has quit smoking. He has never used smokeless tobacco. He reports that he does not drink alcohol or use drugs.    Allergies  Allergen Reactions  . Ace Inhibitors Other (See Comments)    unknown  . Aminoglycosides   . Erythromycin Nausea And Vomiting  . Levofloxacin Other (See Comments)   dehydration  . Penicillins Nausea And Vomiting  . Sulfa Antibiotics   . Ciprofloxacin Rash    hives    Medications:  Prior to Admission medications   Medication Sig Start Date End Date Taking? Authorizing Provider  aspirin EC 81 MG tablet Take 81 mg by mouth daily. Reported on 11/03/2015   Yes Historical Provider, MD  Azelaic Acid (FINACEA) 15 % cream Apply 1 application topically 2 (two) times daily as needed. For Roscacea   Yes Historical Provider, MD  levETIRAcetam (KEPPRA) 500 MG tablet TAKE ONE TABLET TWICE DAILY 07/31/15  Yes Kathrynn Ducking, MD  losartan (COZAAR) 50 MG tablet Take 1 tablet (50 mg total) by mouth daily. 10/13/15  Yes Lorretta Harp, MD  Probiotic Product (ALIGN PO) Take by mouth daily.   Yes Historical Provider, MD    ROS:  Out of a complete 14 system review of symptoms, the patient complains only of the following symptoms, and all other reviewed systems are negative.  Ear discharge, ringing in ears, runny nose, drooling Double vision, blurred vision Cough, wheezing, shortness of breath Nausea Dizziness, headache, speech difficulty, weakness, facial drooping Muscle cramps  Blood pressure 129/72, pulse (!) 57, height 5\' 9"  (1.753 m), weight 161 lb 8 oz (73.3 kg).  Physical Exam  General: The patient is alert and cooperative  at the time of the examination.  Skin: No significant peripheral edema is noted.   Neurologic Exam  Mental status: The patient is alert and oriented x 3 at the time of the examination. The patient has apparent normal recent and remote memory, with an apparently normal attention span and concentration ability.   Cranial nerves: Facial symmetry is present. Speech is normal, no aphasia or dysarthria is noted. Extraocular movements are full. Visual fields are full.  Motor: The patient has good strength in all 4 extremities.  Sensory examination: Soft touch sensation is symmetric on the face, arms, and legs.  Coordination: The  patient has good finger-nose-finger and heel-to-shin bilaterally.  Gait and station: The patient has a normal gait. Tandem gait is normal. Romberg is negative. No drift is seen.  Reflexes: Deep tendon reflexes are symmetric.   Assessment/Plan:  1. History of nocturnal seizures  The patient is done well on the [place], we will reduce the dose slightly taking 250 mg in the morning, and 500 mg in the evening. The patient will follow-up in one year, sooner if needed. He will contact our office if he is having any new issues.  Jill Alexanders MD 02/25/2016 10:47 AM  [facility] [address] [place], [place] [address]  Phone [phone] Fax [phone]

## 2016-03-03 ENCOUNTER — Telehealth: Payer: Self-pay | Admitting: Neurology

## 2016-03-03 NOTE — Telephone Encounter (Signed)
Returned pt TC. Let him know that his CT scan results from March and his MRI results from April are accessible through his EMR. Pt agreed to have Dr. Thornell Mule' office fax over any office visit notes or results from recent appt.

## 2016-03-03 NOTE — Telephone Encounter (Signed)
Pt called said at last OV visit he thought Dr Jannifer Franklin wanted records from Dr Thornell Mule to review. The patient is not sure if it is OV or MRI, etc. Please call to clarify

## 2016-03-15 ENCOUNTER — Ambulatory Visit (INDEPENDENT_AMBULATORY_CARE_PROVIDER_SITE_OTHER): Payer: Medicare Other | Admitting: Cardiovascular Disease

## 2016-03-15 ENCOUNTER — Encounter: Payer: Self-pay | Admitting: Cardiovascular Disease

## 2016-03-15 VITALS — BP 128/74 | HR 61 | Ht 69.0 in | Wt 159.6 lb

## 2016-03-15 DIAGNOSIS — Z0000 Encounter for general adult medical examination without abnormal findings: Secondary | ICD-10-CM | POA: Diagnosis not present

## 2016-03-15 DIAGNOSIS — Z79899 Other long term (current) drug therapy: Secondary | ICD-10-CM | POA: Diagnosis not present

## 2016-03-15 DIAGNOSIS — I10 Essential (primary) hypertension: Secondary | ICD-10-CM

## 2016-03-15 DIAGNOSIS — E785 Hyperlipidemia, unspecified: Secondary | ICD-10-CM | POA: Diagnosis not present

## 2016-03-15 DIAGNOSIS — I712 Thoracic aortic aneurysm, without rupture, unspecified: Secondary | ICD-10-CM

## 2016-03-15 NOTE — Assessment & Plan Note (Signed)
History of hypertension blood pressure measured 120/74. He is on losartan. Continue current meds at current dosing

## 2016-03-15 NOTE — Assessment & Plan Note (Signed)
History of hyperlipidemia not on statin therapy with lipid profile performed 03/26/15 bili total cholesterol 126, LDL 70 and HDL of 39

## 2016-03-15 NOTE — Assessment & Plan Note (Signed)
History of a small descending thoracic aortic aneurysm with recent CTA performed 11/18/15 revealing a maximum dimension of 4.3 cm unchanged from prior measurements. It was noted however that he had significant coronary calcification. He denies chest pain or shortness of breath and did have a negative Myoview stress test 01/19/12.

## 2016-03-15 NOTE — Patient Instructions (Signed)
Medication Instructions: Your physician recommends that you continue on your current medications as directed. Please refer to the Current Medication list given to you today.   Labwork: Your physician recommends that you return for lab work today--TSH, Free T4, Lipid, liver, BMET, CBC   Follow-Up: Your physician wants you to follow-up in: 1 year with Dr. Gwenlyn Found. You will receive a reminder letter in the mail two months in advance. If you don't receive a letter, please call our office to schedule the follow-up appointment.   Any Other Special Instructions will be listed below:  Primary Care Providers:  Thressa Sheller with [facility]  Dr. Jackelyn Poling   If you need a refill on your cardiac medications before your next appointment, please call your pharmacy.

## 2016-03-15 NOTE — Progress Notes (Signed)
03/15/2016 Zachary Padilla   02/05/1929  [id number]  Primary Physician Zachary Copa, MD Primary Cardiologist: Zachary Harp MD Zachary Padilla  HPI:  Zachary Padilla is an 82 year old in and fit appearing Zachary Padilla Caucasian male father of 2, grandfather 4 grandchildren who I saw last 03/11/15. He has a history of hypertension, mild hyperlipidemia and family history of heart disease as well as chronic right bundle branch block. His mother died of an MI at age 96. A Myoview stress test performed 01/19/12 was entirely normal. He denies chest pain or shortness of breath. A chest CT angiogram was performed 03/22/12 for evaluation of potential lung cancer did incidentally reveal a 4.3 cm descending thoracic aortic aneurysm which he has been asymptomatic fromthis was rechecked April of this year and was unchanged. He denies chest pain or shortness of breath. His most recent CT scan performed 11/18/15 revealed his ascending aneurysm measuring 4.3  cm at its maximum dimension, unchanged from prior studies.   Current Outpatient Prescriptions  Medication Sig Dispense Refill  . aspirin EC 81 MG tablet Take 81 mg by mouth daily. Reported on 11/03/2015    . Azelaic Acid (FINACEA) 15 % cream Apply 1 application topically 2 (two) times daily as needed. For Roscacea    . levETIRAcetam (KEPPRA) 500 MG tablet Take 500 mg by mouth 2 (two) times daily.    Marland Kitchen losartan (COZAAR) 50 MG tablet Take 1 tablet (50 mg total) by mouth daily. 90 tablet 0  . Probiotic Product (ALIGN PO) Take by mouth daily.    Marland Kitchen SALINE NASAL SPRAY NA Place 1 spray into the nose 2 (two) times daily.     No current facility-administered medications for this visit.     Allergies  Allergen Reactions  . Ace Inhibitors Other (See Comments)    unknown  . Aminoglycosides   . Erythromycin Nausea And Vomiting  . Levofloxacin Other (See Comments)    dehydration  . Penicillins Nausea And Vomiting  . Sulfa Antibiotics   . Ciprofloxacin  Rash    hives    Social History   Social History  . Marital status: Zachary Padilla    Spouse name: N/A  . Number of children: 2  . Years of education: 15   Occupational History  . retired    Social History Main Topics  . Smoking status: Former Research scientist (life sciences)  . Smokeless tobacco: Never Used     Comment: Lives with spouse-independant in all ADLs. [employer] and [employer] at [employer], Zachary Padilla in "54"  . Alcohol use No  . Drug use: No  . Sexual activity: Yes   Other Topics Concern  . Not on file   Social History Narrative   Patient occasionally drinks tea.   Patient is right handed.     Review of Systems: General: negative for chills, fever, night sweats or weight changes.  Cardiovascular: negative for chest pain, dyspnea on exertion, edema, orthopnea, palpitations, paroxysmal nocturnal dyspnea or shortness of breath Dermatological: negative for rash Respiratory: negative for cough or wheezing Urologic: negative for hematuria Abdominal: negative for nausea, vomiting, diarrhea, bright red blood per rectum, melena, or hematemesis Neurologic: negative for visual changes, syncope, or dizziness All other systems reviewed and are otherwise negative except as noted above.    Blood pressure 128/74, pulse 61, height 5\' 9"  (1.753 m), weight 159 lb 9.6 oz (72.4 kg).  General appearance: alert and no distress Neck: no adenopathy, no carotid bruit, no JVD, supple, symmetrical, trachea midline  and thyroid not enlarged, symmetric, no tenderness/mass/nodules Lungs: clear to auscultation bilaterally Heart: regular rate and rhythm, S1, S2 normal, no murmur, click, rub or gallop Extremities: extremities normal, atraumatic, no cyanosis or edema  EKG normal sinus rhythm at 61 with right bundle-branch block and first-degree AV block. I personally reviewed this EKG  ASSESSMENT AND PLAN:   Essential hypertension History of hypertension blood pressure measured 120/74. He is on  losartan. Continue current meds at current dosing  Hyperlipidemia History of hyperlipidemia not on statin therapy with lipid profile performed 03/26/15 bili total cholesterol 126, LDL 70 and HDL of 39  Thoracic aortic aneurysm History of a small descending thoracic aortic aneurysm with recent CTA performed 11/18/15 revealing a maximum dimension of 4.3 cm unchanged from prior measurements. It was noted however that he had significant coronary calcification. He denies chest pain or shortness of breath and did have a negative Myoview stress test 01/19/12.      Zachary Harp MD FACP,FACC,FAHA, [facility] 03/15/2016 12:23 PM

## 2016-03-22 ENCOUNTER — Other Ambulatory Visit: Payer: Self-pay | Admitting: Cardiovascular Disease

## 2016-03-22 LAB — BASIC METABOLIC PANEL WITH GFR
BUN: 17 mg/dL (ref 7–25)
CO2: 27 mmol/L (ref 20–31)
CREATININE: 1.27 mg/dL — AB (ref 0.70–1.11)
Calcium: 8.7 mg/dL (ref 8.6–10.3)
Chloride: 101 mmol/L (ref 98–110)
GFR, EST AFRICAN AMERICAN: 59 mL/min — AB (ref >=60)
GFR, Est Non African American: 51 mL/min — ABNORMAL LOW (ref >=60)
GLUCOSE: 80 mg/dL (ref 65–99)
POTASSIUM: 4.7 mmol/L (ref 3.5–5.3)
Sodium: 134 mmol/L — ABNORMAL LOW (ref 135–146)

## 2016-03-22 LAB — LIPID PANEL
CHOLESTEROL: 123 mg/dL (ref <200)
HDL: 50 mg/dL (ref >40)
LDL CALC: 63 mg/dL (ref <100)
Total CHOL/HDL Ratio: 2.5 Ratio (ref <5.0)
Triglycerides: 52 mg/dL (ref <150)
VLDL: 10 mg/dL (ref <30)

## 2016-03-22 LAB — CBC
HEMATOCRIT: 41.9 % (ref 38.5–50.0)
HEMOGLOBIN: 14.3 g/dL (ref 13.2–17.1)
MCH: 32.9 pg (ref 27.0–33.0)
MCHC: 34.1 g/dL (ref 32.0–36.0)
MCV: 96.5 fL (ref 80.0–100.0)
MPV: 9 fL (ref 7.5–12.5)
Platelets: 160 10*3/uL (ref 140–400)
RBC: 4.34 MIL/uL (ref 4.20–5.80)
RDW: 13.2 % (ref 11.0–15.0)
WBC: 5.5 10*3/uL (ref 3.8–10.8)

## 2016-03-22 LAB — HEPATIC FUNCTION PANEL
ALK PHOS: 73 U/L (ref 40–115)
ALT: 15 U/L (ref 9–46)
AST: 22 U/L (ref 10–35)
Albumin: 3.9 g/dL (ref 3.6–5.1)
BILIRUBIN DIRECT: 0.2 mg/dL (ref <=0.2)
BILIRUBIN INDIRECT: 0.8 mg/dL (ref 0.2–1.2)
Total Bilirubin: 1 mg/dL (ref 0.2–1.2)
Total Protein: 6.6 g/dL (ref 6.1–8.1)

## 2016-03-23 ENCOUNTER — Encounter: Payer: Self-pay | Admitting: *Deleted

## 2016-03-23 LAB — T4, FREE: FREE T4: 1.2 ng/dL (ref 0.8–1.8)

## 2016-03-23 LAB — TSH: TSH: 1.16 mIU/L (ref 0.40–4.50)

## 2016-04-09 ENCOUNTER — Other Ambulatory Visit: Payer: Self-pay | Admitting: Cardiovascular Disease

## 2016-04-11 NOTE — Telephone Encounter (Signed)
Rx(s) sent to pharmacy electronically.  

## 2016-04-13 ENCOUNTER — Telehealth: Payer: Self-pay | Admitting: Cardiovascular Disease

## 2016-04-13 MED ORDER — COZAAR 50 MG PO TABS: 50.0000 mg | ORAL_TABLET | Freq: Every day | ORAL | 11 refills | Status: DC

## 2016-04-13 NOTE — Telephone Encounter (Signed)
Please call,concerning his Cozaar.

## 2016-04-13 NOTE — Telephone Encounter (Signed)
Spoke to patient today regarding his cozaar. States he's been having trouble getting this filled. He asked to have the brand-only prescription resent to pharmacy. States he needs written as brand only due to medication allergy. Informed him I would do so. Called [name] after sending, they state actually patient needs Prior Auth due to the med being DAW. I provided them our fax number to send paperwork, if required.  I have called patient and explained situation. He is currently out of Cozaar -- took last dose yesterday. I recommended he contact pharmacy - would have to pay out of pocket but could probably see if they will loan or sell him a few days supply while awaiting progress on the PA.  He does have several med allergies, but I wonder if it's reasonable to try to switch his ARB - would probably need pharmD to discuss this w him. Regardless, am waiting on receipt of fax. This may be sent to attn of Dr. Gwenlyn Found so will include Lovena Le on this message.

## 2016-04-18 NOTE — Telephone Encounter (Signed)
I've spoken w this patient, he's aware I've submitted PA. Awaiting outcome. Currently, the PA is incomplete, it requires more information to submit because I was unable to put in the request for brand only medication - however, I am getting technical errors when trying to enter further info.  I called [facility] to resolve this issue and was told by operator after an extended hold that their systems were down right now, and I was recommended to call back in an hour. This needs follow up.

## 2016-04-18 NOTE — Telephone Encounter (Signed)
PA Key: [id number] Submitted 04/18/16

## 2016-04-19 NOTE — Telephone Encounter (Signed)
Spoke w Shanon Brow at pharmacy who confirmed med approved and patient was contacted. Aware to call if further needs.

## 2016-04-19 NOTE — Telephone Encounter (Signed)
Attempted to reach patient to notify, no answer when dialed. I called pharmacy and they informed me that the insurer has actually approved, through part D, after pharmacy called them and clarified situation regarding an unrequested dispense of #90 for the generic form which patient was unable to use.  I've asked for a callback from pharmacy confirming patient has this information.

## 2016-04-19 NOTE — Telephone Encounter (Signed)
Spoke to insurance provider regarding a notification of prior authorization denial. They state that this is related to a plan exclusion - his insurance does not cover this med and they offered recommendation for alternative meds. On further review, they stated since he has  Medicare part B - not part D - there is no Rx drug coverage - patient can get whatever meds are prescribed, he will just be required to pay out of pocket.

## 2016-04-25 ENCOUNTER — Other Ambulatory Visit: Payer: Self-pay

## 2016-04-25 MED ORDER — COZAAR 50 MG PO TABS: 50.0000 mg | ORAL_TABLET | Freq: Every day | ORAL | 3 refills | Status: DC

## 2016-06-20 ENCOUNTER — Other Ambulatory Visit: Payer: Self-pay | Admitting: *Deleted

## 2016-06-20 MED ORDER — COZAAR 50 MG PO TABS: 50.0000 mg | ORAL_TABLET | Freq: Every day | ORAL | 3 refills | Status: DC

## 2016-06-20 NOTE — Telephone Encounter (Signed)
Received request from [facility] requesting generic Cozaar-per chart review patient request brand name only due to allergy.

## 2016-07-20 ENCOUNTER — Telehealth: Payer: Self-pay | Admitting: Cardiovascular Disease

## 2016-07-20 NOTE — Telephone Encounter (Signed)
Direct changes to PCP

## 2016-07-20 NOTE — Telephone Encounter (Signed)
New Message   Pt c/o medication issue:  1. Name of Medication: Cozaar  2. How are you currently taking this medication (dosage and times per day)? 1 a day, 50 MG  3. Are you having a reaction (difficulty breathing--STAT)? No  4. What is your medication issue? Per pt had labs done at PCP suggested patient temporarily stop medication due to enlarged red blood cells.   Requesting call back

## 2016-07-20 NOTE — Telephone Encounter (Signed)
Returned call to patient. Informed patient that MD would defer to PCP. He voiced understanding.

## 2016-07-20 NOTE — Telephone Encounter (Signed)
Patient is returning your call,thanks. °

## 2016-07-20 NOTE — Telephone Encounter (Signed)
Returned call to patient He states he is concerned about his Cozaar  He had lab work done and his PCP has recommended that his Cozaar be stopped for 1 week d/t kidney function  Patient states he is also on Keppra 250mg  QAM & 500mg  QHS which can affect his kidneys  Advised patient that MD would need to review lab results to determine if he needs to hold his medication  He read his results info: blood count, except for red blood cell count, is OK but red blood cell count is larger; some decrease in kidney function, electrolytes except sodium is a little low, liver function OK, blood sugar OK; red blood cells may be enlarging due to vitamin deficiency -- recommend stop losartan and see PCP in 7-10 days  -- patient has appt with PCP 3/21  Patient states he would think it would be OK to take losartan 25mg  - advised he should contact his PCP regarding this since the med change recommendations originated from this MD and we do not have the lab results. He would also like the message sent to Dr. Gwenlyn Found for his opinion. Message routed to MD/Taylor, CMA. Patient is aware that MD is out of the office.

## 2016-07-20 NOTE — Telephone Encounter (Signed)
LMTCB

## 2016-08-01 ENCOUNTER — Telehealth: Payer: Self-pay | Admitting: Cardiovascular Disease

## 2016-08-01 NOTE — Telephone Encounter (Signed)
New message   Pt c/o medication issue:  1. Name of Medication: COZAAR 50 MG tablet  2. How are you currently taking this medication (dosage and times per day)? 50mg   3. Are you having a reaction (difficulty breathing--STAT)? no  4. What is your medication issue? Pt states that his PCP states that his kidneys are not functioning properly and he states that the PCP wants him off of Cozaar for 7 to 10 days. He is concerned about this and would like some guidance

## 2016-08-01 NOTE — Telephone Encounter (Signed)
Pt wanted you to know that Dr Sheryn Bison office have faxed over his lab work. Please be on the look out for it.

## 2016-08-01 NOTE — Telephone Encounter (Signed)
paients state primary doctor -potassium level is elevated  Over past several weeKs . Patient has decrease dose to Half and now  PRIMARY-Dr THACKER WOULD LIKE HIM TO HOLD COZAAR FOR  10 - 14 DAYS.  PATIENT IS CONCERNED since he has an aneurysm.  RN INFORMED PATIENT TO FOLLOW Dr Harolyn Rutherford instruction AND KEEP A RECORD OF BLOOD PRESSURE FOR DR Sheryn Bison.- - SINCE HE HAS ALL THE INFORMATION IN REGARDS TO LAB RESULTS .   PATIENT CAN CONTACT OFFICE WITH CHANGE IF GIVEN OR DR Sheryn Bison CAN SEND THE INFORMATION TO DR BERRY.  PATIENT VERBALIZED UNDERSTANDING .

## 2016-08-01 NOTE — Telephone Encounter (Signed)
HAVE NOT RECEIVED LABS YET

## 2016-08-03 NOTE — Telephone Encounter (Signed)
Called patient, he has had continued concern that he should not disrupt use of cozaar completely, bc his BPs run too high w/o correction (643C systolic). He is very scared of having aneurysm. Informed him we still have not received results from Dr. March Rummage office. I have made call to Dr. March Rummage office w request to resend. Spoke to rep who will fax BUN & Cr results from 3/21  Pt wanting [place] to manage BP, aware Dr. Gwenlyn Found was recommending to defer to Dr. Sheryn Bison for BP management. Discussed w Ivin Booty who took earlier call - suggests might be beneficial for patient to come in for BP management visit here w pharmD.  Fax pending, will seek recommendations from pharmD

## 2016-08-03 NOTE — Telephone Encounter (Signed)
Talked to patient on the phone.  Will see him at the HTN clinic tomorrow 9:30am at to address problems with medication and HTN management.

## 2016-08-03 NOTE — Telephone Encounter (Signed)
New Message   Pt c/o medication issue:  1. Name of Medication: COZAAR 50 MG tablet  2. How are you currently taking this medication (dosage and times per day)? As prescribed  3. Are you having a reaction (difficulty breathing--STAT)? No  4. What is your medication issue? Pt was advised by PCP to stop taking this medication for a few weeks. Per pt needs to know what the back up plan with be for his blood pressure. Requesting call back

## 2016-08-04 ENCOUNTER — Telehealth: Payer: Self-pay | Admitting: Cardiovascular Disease

## 2016-08-04 ENCOUNTER — Telehealth: Payer: Self-pay | Admitting: Pharmacist

## 2016-08-04 ENCOUNTER — Ambulatory Visit (INDEPENDENT_AMBULATORY_CARE_PROVIDER_SITE_OTHER): Payer: Medicare Other | Admitting: Pharmacist

## 2016-08-04 VITALS — BP 142/78 | HR 61

## 2016-08-04 DIAGNOSIS — I10 Essential (primary) hypertension: Secondary | ICD-10-CM | POA: Diagnosis not present

## 2016-08-04 MED ORDER — AMLODIPINE BESYLATE 5 MG PO TABS: 5.0000 mg | ORAL_TABLET | Freq: Every day | ORAL | 0 refills | Status: DC

## 2016-08-04 NOTE — Telephone Encounter (Signed)
Patient wants pharmacy changed to [facility] on [address]. Please advise

## 2016-08-04 NOTE — Patient Instructions (Addendum)
Return for a  follow up appointment in 2 weeks  Your blood pressure today is 142/78 pulse 61  Check your blood pressure at home daily (if able) and keep record of the readings.  Take your BP meds as follows: **STOP taking losartan** **Start taking amlodipine 5mg  daily**   Bring all of your meds, your BP cuff and your record of home blood pressures to your next appointment.  Exercise as you're able, try to walk approximately 30 minutes per day.  Keep salt intake to a minimum, especially watch canned and prepared boxed foods.  Eat more fresh fruits and vegetables and fewer canned items.  Avoid eating in fast food restaurants.    HOW TO TAKE YOUR BLOOD PRESSURE: . Rest 5 minutes before taking your blood pressure. .  Don't smoke or drink caffeinated beverages for at least 30 minutes before. . Take your blood pressure before (not after) you eat. . Sit comfortably with your back supported and both feet on the floor (don't cross your legs). . Elevate your arm to heart level on a table or a desk. . Use the proper sized cuff. It should fit smoothly and snugly around your bare upper arm. There should be enough room to slip a fingertip under the cuff. The bottom edge of the cuff should be 1 inch above the crease of the elbow. . Ideally, take 3 measurements at one sitting and record the average.

## 2016-08-04 NOTE — Progress Notes (Signed)
Patient ID: Zachary Padilla                 DOB: 12/09/1928                      MRN: [id number]     HPI: Zachary Padilla is a 83 y.o. male referred by Dr. Gwenlyn Found to HTN clinic.  PMH includes essential hypertension, hyperlipidemia, and thoracic aortic aneurysm.  Also noted history of right bundle branch block and 1st degree AV block. Blood pressure previously controlled with use of losartan 50mg  daily but medication recently placed on hold due to acute renal failure.  Patient is very anxious about controlling his hypertension due to history of aortic aneurysm.  Patient presents to day for HTN evaluation and medication management. He is mainly interested on a "back-up plan" to manage BP if elevated. Most recent BMET show Scr = 1.47 (est. CrCl =98ml/min) down from est. CrCl of 69ml/min as baseline.  Current HTN meds:  Losartan 50mg  (ON HOLD) - acute renal injury  Previously tried:  Doxazosin 1mg  - patient not sure   Intolerance/allergies:  ACEi - unknown raction  BP goal: 130/80  Family History: reports heart disease from mother; stroke and heart disease from father and history of cancer from bother ans sister.  Social History: former smoker; denies smokeless tobacco, alcohol or use of any other illicit drug.  Diet: patient increasing water intake, and keep caffeine intake low.  Home BP readings: not record 322 systolic max daily reported   Wt Readings from Last 3 Encounters:  03/15/16 159 lb 9.6 oz (72.4 kg)  02/25/16 161 lb 8 oz (73.3 kg)  11/03/15 157 lb (71.2 kg)   BP Readings from Last 3 Encounters:  08/04/16 (!) 142/78  03/15/16 128/74  02/25/16 129/72   Pulse Readings from Last 3 Encounters:  08/04/16 61  03/15/16 61  02/25/16 (!) 57    Past Medical History:  Diagnosis Date  . 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 ALLERGIC RHINITIS 02/23/2009  . COLONIC POLYPS, HX OF 02/23/2009  . Convulsions/seizures ([place]) 10/24/2014  . DIVERTICULITIS, HX OF 02/23/2009  . Ear infection   . Family  history of heart disease   . GERD 02/23/2009  . Hyperlipidemia   . HYPERTENSION 02/23/2009  . PONV (postoperative nausea and vomiting)   . Pre-syncope   . Thoracic aortic aneurysm ([place])   . URINARY INCONTINENCE 02/23/2009    Current Outpatient Prescriptions on File Prior to Visit  Medication Sig Dispense Refill  . aspirin EC 81 MG tablet Take 81 mg by mouth daily. Reported on 11/03/2015    . Azelaic Acid (FINACEA) 15 % cream Apply 1 application topically 2 (two) times daily as needed. For Roscacea    . levETIRAcetam (KEPPRA) 500 MG tablet Take 500 mg by mouth 2 (two) times daily.    . Probiotic Product (ALIGN PO) Take by mouth daily.    Marland Kitchen SALINE NASAL SPRAY NA Place 1 spray into the nose 2 (two) times daily.     No current facility-administered medications on file prior to visit.     Allergies  Allergen Reactions  . Ace Inhibitors Other (See Comments)    unknown  . Aminoglycosides   . Erythromycin Nausea And Vomiting  . Levofloxacin Other (See Comments)    dehydration  . Penicillins Nausea And Vomiting  . Sulfa Antibiotics   . Ciprofloxacin Rash    hives    Blood pressure (!) 142/78, pulse 61, SpO2 98 %.  Essential hypertension: Blood pressure today is slightly above goal of 130/80. Spent significant amount of time counseling patient about keeping proper hydration, and following instructions from PCP and neurologist as recommended.  Losartan currently on hold due to acute increase in Scr.  Patient is very anxious about potential increase in blood pressure once off losartan and potential worsening of his aneurysm.  Will start amlodipine 5mg  daily, discontinue losartan 50mg , and follow up with repeat BMET in 2 weeks. Follow up in 2 weeks at the HTN clinic.   Leilan Bochenek Rodriguez-Guzman PharmD, [place] [id number] 08/04/2016 11:23 AM

## 2016-08-04 NOTE — Telephone Encounter (Signed)
Called [facility] per patient's request.  Talked to  Dr March Rummage nurse Janett Billow RN). Informed provider about current plan to D/C losartan and start amlodipine 5mg  daily for blood pressure management.

## 2016-08-04 NOTE — Telephone Encounter (Signed)
Rx today(amlodipine) sent to [facility]. I will change the preference in the computer to [facility] as well.

## 2016-08-18 ENCOUNTER — Other Ambulatory Visit: Payer: Self-pay | Admitting: Neurology

## 2016-08-22 ENCOUNTER — Ambulatory Visit (INDEPENDENT_AMBULATORY_CARE_PROVIDER_SITE_OTHER): Payer: Medicare Other | Admitting: Pharmacist

## 2016-08-22 VITALS — BP 110/60 | HR 65

## 2016-08-22 DIAGNOSIS — I10 Essential (primary) hypertension: Secondary | ICD-10-CM

## 2016-08-22 MED ORDER — AMLODIPINE BESYLATE 5 MG PO TABS: 5.0000 mg | ORAL_TABLET | Freq: Every day | ORAL | 1 refills | Status: DC

## 2016-08-22 NOTE — Patient Instructions (Signed)
Return for a  follow up appointment in 4 weeks  Your blood pressure today is 110/60 pulse 65   Check your blood pressure at home daily (if able) and keep record of the readings.  Take your BP meds as follows: **Continue amlodipine 5mg  daily**  Bring all of your meds, your BP cuff and your record of home blood pressures to your next appointment.  Exercise as you're able, try to walk approximately 30 minutes per day.  Keep salt intake to a minimum, especially watch canned and prepared boxed foods.  Eat more fresh fruits and vegetables and fewer canned items.  Avoid eating in fast food restaurants.    HOW TO TAKE YOUR BLOOD PRESSURE: . Rest 5 minutes before taking your blood pressure. .  Don't smoke or drink caffeinated beverages for at least 30 minutes before. . Take your blood pressure before (not after) you eat. . Sit comfortably with your back supported and both feet on the floor (don't cross your legs). . Elevate your arm to heart level on a table or a desk. . Use the proper sized cuff. It should fit smoothly and snugly around your bare upper arm. There should be enough room to slip a fingertip under the cuff. The bottom edge of the cuff should be 1 inch above the crease of the elbow. . Ideally, take 3 measurements at one sitting and record the average.

## 2016-08-22 NOTE — Progress Notes (Signed)
Patient ID: Zachary Padilla                 DOB: [birth year]                      MRN: [id number]     HPI: Zachary Padilla is a 83 y.o. male referred by Dr. Gwenlyn Found to HTN clinic.  PMH includes essential hypertension, hyperlipidemia, and thoracic aortic aneurysm.  Also noted history of right bundle branch block and 1st degree AV block. Blood pressure previously controlled with use of losartan 50mg  daily but medication recently placed on hold due to acute renal failure.  Patient is very anxious about controlling his hypertension due to history of aortic aneurysm.  Patient presents today for HTN follow up. He is mainly interested on a "back-up plan" to manage BP if elevated. Most recent BMET show Scr = 1.47 (est. CrCl =71ml/min) down from est. CrCl of 69ml/min as baseline. Patient reports an episode of lightheadedness on Saturday that lasted for few hour but BP was within normal limits. Also noted acute nasal congestion due to allergy to pollen.   Current HTN meds:  Losartan 50mg  (ON HOLD) - acute renal injury  Previously tried:  Doxazosin 1mg  - patient not sure   Intolerance/allergies:  ACEi - unknown raction  BP goal: 130/80  Family History: reports heart disease from mother; stroke and heart disease from father and history of cancer from bother ans sister.  Social History: former smoker; denies smokeless tobacco, alcohol or use of any other illicit drug.  Diet: patient increasing water intake, and keep caffeine intake low.   Home BP readings: 20 readings; 128/69 average; pulse range 57-70bpm  Wt Readings from Last 3 Encounters:  03/15/16 159 lb 9.6 oz (72.4 kg)  02/25/16 161 lb 8 oz (73.3 kg)  11/03/15 157 lb (71.2 kg)   BP Readings from Last 3 Encounters:  08/22/16 110/60  08/04/16 (!) 142/78  03/15/16 128/74   Pulse Readings from Last 3 Encounters:  08/22/16 65  08/04/16 61  03/15/16 61    Past Medical History:  Diagnosis Date  . 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 ALLERGIC  RHINITIS 02/23/2009  . COLONIC POLYPS, HX OF 02/23/2009  . Convulsions/seizures ([place]) 10/24/2014  . DIVERTICULITIS, HX OF 02/23/2009  . Ear infection   . Family history of heart disease   . GERD 02/23/2009  . Hyperlipidemia   . HYPERTENSION 02/23/2009  . PONV (postoperative nausea and vomiting)   . Pre-syncope   . Thoracic aortic aneurysm ([place])   . URINARY INCONTINENCE 02/23/2009    Current Outpatient Prescriptions on File Prior to Visit  Medication Sig Dispense Refill  . aspirin EC 81 MG tablet Take 81 mg by mouth daily. Reported on 11/03/2015    . Azelaic Acid (FINACEA) 15 % cream Apply 1 application topically 2 (two) times daily as needed. For Roscacea    . levETIRAcetam (KEPPRA) 500 MG tablet Take 500 mg by mouth 2 (two) times daily.    Marland Kitchen levETIRAcetam (KEPPRA) 500 MG tablet 1/2 tablet in the morning and one tablet in the evening 135 tablet 3  . Probiotic Product (ALIGN PO) Take by mouth daily.    Marland Kitchen SALINE NASAL SPRAY NA Place 1 spray into the nose 2 (two) times daily.     No current facility-administered medications on file prior to visit.     Allergies  Allergen Reactions  . Ace Inhibitors Other (See Comments)    unknown  . Aminoglycosides   .  Erythromycin Nausea And Vomiting  . Levofloxacin Other (See Comments)    dehydration  . Penicillins Nausea And Vomiting  . Sulfa Antibiotics   . Ciprofloxacin Rash    hives    Blood pressure 110/60, pulse 65, SpO2 95 %.  Essential hypertension: Blood pressure today well controlled and at goal of <130/80. Patient tolerating current therapy without complains. Next follow up  with PCP in 3 weeks for re-assessment of acute renal failure. Will continue amlodipine 5mg  daily, continue daily BP monitoring at home, and follow up in 4 weeks at the HTN clinic.   Tracker Mance Rodriguez-Guzman PharmD, [place] [id number] 08/22/2016 2:49 PM

## 2016-09-13 ENCOUNTER — Other Ambulatory Visit: Payer: Self-pay | Admitting: Pharmacist

## 2016-09-13 NOTE — Telephone Encounter (Signed)
Patient called to request refill for amlodipine.   Last rx has refills available. Pt will pick up today

## 2016-09-20 ENCOUNTER — Ambulatory Visit (INDEPENDENT_AMBULATORY_CARE_PROVIDER_SITE_OTHER): Payer: Medicare Other | Admitting: Pharmacist Clinician (PhC)/ Clinical Pharmacy Specialist

## 2016-09-20 DIAGNOSIS — I10 Essential (primary) hypertension: Secondary | ICD-10-CM | POA: Diagnosis not present

## 2016-09-20 NOTE — Patient Instructions (Signed)
I will contact Dr. Sheryn Bison tomorrow about getting copy of blood work drawn today.  If kidney function is within acceptable range we will stop the amlodipine and go back to losartan(Cozaar).  If we do this we will repeat blood work in about 2 weeks.  Your blood pressure today is 116/70 (goal is < 130/80)  Check your blood pressure at home several times each week and keep record of the readings.   Bring all of your meds, your BP cuff and your record of home blood pressures to your next appointment.  Exercise as you're able, try to walk approximately 30 minutes per day.  Keep salt intake to a minimum, especially watch canned and prepared boxed foods.  Eat more fresh fruits and vegetables and fewer canned items.  Avoid eating in fast food restaurants.    HOW TO TAKE YOUR BLOOD PRESSURE: . Rest 5 minutes before taking your blood pressure. .  Don't smoke or drink caffeinated beverages for at least 30 minutes before. . Take your blood pressure before (not after) you eat. . Sit comfortably with your back supported and both feet on the floor (don't cross your legs). . Elevate your arm to heart level on a table or a desk. . Use the proper sized cuff. It should fit smoothly and snugly around your bare upper arm. There should be enough room to slip a fingertip under the cuff. The bottom edge of the cuff should be 1 inch above the crease of the elbow. . Ideally, take 3 measurements at one sitting and record the average.

## 2016-09-20 NOTE — Assessment & Plan Note (Signed)
Patient with BP much improved on amlodipine 5 mg daily.  Patient had labs drawn with PCP this am.  Will get copy, if SCr within acceptable range will have him stop amlodipine (due to swelling) and re-start losartan 50 mg daily.  Will repeat BMET about 2 weeks after re-starting losartan.

## 2016-09-20 NOTE — Progress Notes (Signed)
Patient ID: Zachary Padilla                 DOB: 1928/10/18                      MRN: [id number]     HPI: Zachary Padilla is a 83 y.o. male referred by Dr. Gwenlyn Found to HTN clinic.  PMH includes essential hypertension, hyperlipidemia, and thoracic aortic aneurysm.  Also noted history of right bundle branch block and 1st degree AV block. Blood pressure previously controlled with use of losartan 50mg  daily but medication recently placed on hold due to acute renal failure.  Patient is very anxious about controlling his hypertension due to history of aortic aneurysm.  He has been on amlodipine 5 mg for past several weeks.  It has controlled his home blood pressure quite well, however he is having some lower extremity edema.    Today he presents without any cardiac concerns. He has noted the edema, but does not appear to be bothered by it.  He has no chest pains or shortness of breath.  He does have some cough/vocal problems, related to the high pollen counts.    Current HTN meds:  Amlodipine 5 mg  Previously tried:  Doxazosin 1mg  - patient not sure  Losartan 50 mg - increase in SCr (was not re-tested to see if this was chronic increase)  Intolerance/allergies:  ACEi - unknown raction  BP goal: 130/80  Family History: reports heart disease from mother; stroke and heart disease from father and history of cancer from bother ans sister.  Social History: former smoker; denies smokeless tobacco, alcohol or use of any other illicit drug.  Diet: patient increasing water intake, and keep caffeine intake low.   Home BP readings: 27 readings; 119/61 average; range 94-138/53-77;  HR average 69; range 56-81bpm  Wt Readings from Last 3 Encounters:  03/15/16 159 lb 9.6 oz (72.4 kg)  02/25/16 161 lb 8 oz (73.3 kg)  11/03/15 157 lb (71.2 kg)   BP Readings from Last 3 Encounters:  09/20/16 116/70  08/22/16 110/60  08/04/16 (!) 142/78   Pulse Readings from Last 3 Encounters:  09/20/16 68  08/22/16 65    08/04/16 61    Past Medical History:  Diagnosis Date  . 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 ALLERGIC RHINITIS 02/23/2009  . COLONIC POLYPS, HX OF 02/23/2009  . Convulsions/seizures ([place]) 10/24/2014  . DIVERTICULITIS, HX OF 02/23/2009  . Ear infection   . Family history of heart disease   . GERD 02/23/2009  . Hyperlipidemia   . HYPERTENSION 02/23/2009  . PONV (postoperative nausea and vomiting)   . Pre-syncope   . Thoracic aortic aneurysm ([place])   . URINARY INCONTINENCE 02/23/2009    Current Outpatient Prescriptions on File Prior to Visit  Medication Sig Dispense Refill  . amLODipine (NORVASC) 5 MG tablet Take 1 tablet (5 mg total) by mouth daily. 22 tablet 1  . aspirin EC 81 MG tablet Take 81 mg by mouth daily. Reported on 11/03/2015    . Azelaic Acid (FINACEA) 15 % cream Apply 1 application topically 2 (two) times daily as needed. For Roscacea    . levETIRAcetam (KEPPRA) 500 MG tablet Take 500 mg by mouth 2 (two) times daily.    Marland Kitchen levETIRAcetam (KEPPRA) 500 MG tablet 1/2 tablet in the morning and one tablet in the evening 135 tablet 3  . Probiotic Product (ALIGN PO) Take by mouth daily.    Marland Kitchen SALINE NASAL SPRAY NA  Place 1 spray into the nose 2 (two) times daily.     No current facility-administered medications on file prior to visit.     Allergies  Allergen Reactions  . Ace Inhibitors Other (See Comments)    unknown  . Aminoglycosides   . Erythromycin Nausea And Vomiting  . Levofloxacin Other (See Comments)    dehydration  . Penicillins Nausea And Vomiting  . Sulfa Antibiotics   . Ciprofloxacin Rash    hives    Blood pressure 116/70, pulse 68.  Essential hypertension: Patient with BP much improved on amlodipine 5 mg daily.  Patient had labs drawn with PCP this am.  Will get copy, if SCr within acceptable range will have him stop amlodipine (due to swelling) and re-start losartan 50 mg daily.  Will repeat BMET about 2 weeks after re-starting losartan.    Tommy Medal PharmD  CPP [facility] [address] [place] [address] 09/20/2016 4:50 PM

## 2016-10-06 ENCOUNTER — Other Ambulatory Visit: Payer: Self-pay | Admitting: Cardiovascular Disease

## 2016-10-11 ENCOUNTER — Telehealth: Payer: Self-pay | Admitting: Pharmacist Clinician (PhC)/ Clinical Pharmacy Specialist

## 2016-10-11 NOTE — Telephone Encounter (Signed)
Received labs from [place], drawn 09/20/16.  Pt SCr stable at 1.42 (prev in March was 1.37).  Patient would still like to switch amlodipine back to losartan.  He is leaving on vacation tomorrow, so he will call when he gets back into town (next week) and we can convert him back to losartan at that time.  He knows we will repeat BMET after 2 weeks, to be sure SCr and electrolytes stay stable.

## 2016-11-04 ENCOUNTER — Telehealth: Payer: Self-pay | Admitting: Student

## 2016-11-04 ENCOUNTER — Telehealth: Payer: Self-pay | Admitting: Cardiovascular Disease

## 2016-11-04 NOTE — Telephone Encounter (Signed)
    Patient called the answering service in wanting to verify he can take Cipro ear drops for an ear infection which was just diagnosed by his PCP. Reviewed his medications and no significant interactions with this. He is continuing on Amlodipine at this time as he is scheduled to see a Nephrologist in the coming weeks (as Losartan remains on hold).   Patient voiced appreciation of the call and had no further questions.    Signed, Erma Heritage, PA-C 11/04/2016, 5:53 PM

## 2016-11-04 NOTE — Telephone Encounter (Signed)
F/u message  Pt returning pharmacy call. Please call back to discuss

## 2016-11-04 NOTE — Telephone Encounter (Signed)
Unable to reach patient today. Will try again on Monday morning.  LMOM for patient to contact on-call PA/physician if urgent assistance needed.

## 2016-11-10 NOTE — Telephone Encounter (Signed)
LMOM; unable to reach patient. Unknown reason to originate call.  Patient can call back as needed.

## 2016-11-15 ENCOUNTER — Other Ambulatory Visit: Payer: Self-pay | Admitting: Nephrology

## 2016-11-15 ENCOUNTER — Ambulatory Visit
Admission: RE | Admit: 2016-11-15 | Discharge: 2016-11-15 | Disposition: A | Discharge status: Home / Self Care | Payer: Medicare Other | Source: Ambulatory Visit | Attending: Nephrology | Admitting: Nephrology

## 2016-11-15 DIAGNOSIS — N183 Chronic kidney disease, stage 3 unspecified: Secondary | ICD-10-CM

## 2016-11-17 ENCOUNTER — Telehealth: Payer: Self-pay | Admitting: Pharmacist

## 2016-11-17 NOTE — Telephone Encounter (Signed)
Patient to resume cozaar daily, per nephrologist recommendation, to reassess effect on kidneys ; amlodipine d/c for now.

## 2016-11-20 ENCOUNTER — Telehealth: Payer: Self-pay | Admitting: Physician Assistant

## 2016-11-20 NOTE — Telephone Encounter (Signed)
Paged by answering service. BP running low as below. Feeling weak. No chest pain, dyspnea, dizziness or palpitations.   103/53  95/50 103/59 96/56  Hold Losartan today. Call office tomorrow is still low BP. Might need to consider reducing to 1/2 table if still  Low BP.

## 2016-11-21 ENCOUNTER — Telehealth: Payer: Self-pay | Admitting: Cardiovascular Disease

## 2016-11-21 MED ORDER — LOSARTAN POTASSIUM 50 MG PO TABS: 25.0000 mg | ORAL_TABLET | Freq: Every day | ORAL | 3 refills | Status: DC

## 2016-11-21 NOTE — Telephone Encounter (Signed)
New message ° ° ° °Pt is calling asking for a call back. He did not say what it was about.  °

## 2016-11-21 NOTE — Telephone Encounter (Signed)
Spoke with patient, he has noted some low BP readings for past several days, called to PA on Sunday and was told to hold losartan 50 mg and call today to check in with PharmD.    Has had several readings past few days 34-917 systolic and noted feeling washed out/light headed.  Feels better today since skipped losartan yesterday.  This afternoon BP was up to 118/68.  Advised he restart the losartan tomorrow at 25 mg (1/2 tablet) daily and call in 1 week to report BP readings.  Patient voiced understanding

## 2016-11-21 NOTE — Telephone Encounter (Signed)
Pt said he was told yesterday by one of the PA to call and talk to the pharamcist today. This is concerning his Cozaar.

## 2016-11-25 ENCOUNTER — Telehealth: Payer: Self-pay | Admitting: Neurology

## 2016-11-25 NOTE — Telephone Encounter (Signed)
I have received blood work results from Dr. Arty Baumgartner done on 11/15/2016.  The BUN is 15, creatinine 1.31, GFR is 49. Sodium 136, potassium 4.6, chloride 101, CO2 28, calcium 9.2, total protein 6.6, albumin 4.2. Liver profile is unremarkable. Grismer count 5.8, hemoglobin of 14.4, hematocrit 41.1, MCV 92, platelets of 181.

## 2016-12-16 ENCOUNTER — Telehealth: Payer: Self-pay | Admitting: Cardiovascular Disease

## 2016-12-16 NOTE — Telephone Encounter (Signed)
Patient received call from Nephrologist to hold his losartan for now but no clear on further orders to control his blood pressure.  Will get clarification from Dr Filiberto Pinks office and call patient back with further instructions.

## 2016-12-16 NOTE — Telephone Encounter (Signed)
Patient calling, states that he has some questions and concerns about his medication.

## 2016-12-16 NOTE — Telephone Encounter (Signed)
LMOM ; patient to call back to discuss medication question

## 2016-12-19 NOTE — Telephone Encounter (Signed)
Per Nephrologist - Dr Donato Heinz   Patient instructions if to hold losartan 25mg  for 2 weeks. No amlodipine for now. Nephrologist with re-assess BP and Scr in 2 weeks and determined best therapy.   *BP regularly at 116/60s*  Avoid heat and outside activities for now but no limits on physical activity at this time unless indicated by physician.

## 2017-01-06 ENCOUNTER — Other Ambulatory Visit: Payer: Self-pay | Admitting: Pharmacist

## 2017-01-06 MED ORDER — LOSARTAN POTASSIUM 50 MG PO TABS: 25.0000 mg | ORAL_TABLET | Freq: Every day | ORAL | 3 refills | Status: DC

## 2017-02-07 ENCOUNTER — Other Ambulatory Visit: Payer: Self-pay

## 2017-02-07 ENCOUNTER — Telehealth: Payer: Self-pay | Admitting: Cardiovascular Disease

## 2017-02-07 NOTE — Telephone Encounter (Signed)
New Message  Pt c/o medication issue:  1. Name of Medication: Cozaar  2. How are you currently taking this medication (dosage and times per day)? 50mg    3. Are you having a reaction (difficulty breathing--STAT)? No   4. What is your medication issue? Per pt is having issues getting [facility] please call back to discuss

## 2017-02-07 NOTE — Telephone Encounter (Signed)
He need brand name cozaar. Prior-authorization necessity was sent to insurance > 1 months ago. Please follow up with Dr Kennon Holter nurse Lovena Le)

## 2017-02-16 NOTE — Telephone Encounter (Signed)
Is calling,concerning his prior authorization for Cozaar.

## 2017-02-23 NOTE — Telephone Encounter (Signed)
Please call pt,he is waiting to hear about his Cozaar.

## 2017-02-24 ENCOUNTER — Encounter: Payer: Self-pay | Admitting: Neurology

## 2017-02-24 ENCOUNTER — Ambulatory Visit (INDEPENDENT_AMBULATORY_CARE_PROVIDER_SITE_OTHER): Payer: Medicare Other | Admitting: Neurology

## 2017-02-24 VITALS — BP 119/70 | HR 59 | Ht 69.0 in | Wt 161.0 lb

## 2017-02-24 DIAGNOSIS — R569 Unspecified convulsions: Secondary | ICD-10-CM | POA: Diagnosis not present

## 2017-02-24 NOTE — Progress Notes (Signed)
Reason for visit: Seizures  Zachary Padilla is an 83 y.o. male  History of present illness:  Zachary Padilla is an 83 year old right-handed [name] male with a history of nocturnal seizures. He has done well on Keppra, he is taking 250 mg the morning and 500 mg in the evening. He has not had any recurrence since last seen, he is operating a motor vehicle without difficulty. The patient is no longer having drowsiness with the new dosing of the Keppra. He is tolerating the medication well. He returns for an evaluation.  Past Medical History:  Diagnosis Date  . 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 ALLERGIC RHINITIS 02/23/2009  . COLONIC POLYPS, HX OF 02/23/2009  . Convulsions/seizures ([place]) 10/24/2014  . DIVERTICULITIS, HX OF 02/23/2009  . Ear infection   . Family history of heart disease   . GERD 02/23/2009  . Hyperlipidemia   . HYPERTENSION 02/23/2009  . PONV (postoperative nausea and vomiting)   . Pre-syncope   . Thoracic aortic aneurysm ([place])   . URINARY INCONTINENCE 02/23/2009    Past Surgical History:  Procedure Laterality Date  . APPENDECTOMY  1946  . CATARACT EXTRACTION Bilateral   . Left ear tube  2001  . Removal of pre-cancer bump on neck  2005  . Ruptured appendics  1946  . Sinus surgery  2008    Family History  Problem Relation Age of Onset  . Heart disease Mother   . Stroke Father   . Heart disease Father   . Cancer Sister   . Cancer Brother   . Heart disease Other   . Stroke Other     Social history:  reports that he has quit smoking. He has never used smokeless tobacco. He reports that he does not drink alcohol or use drugs.    Allergies  Allergen Reactions  . Ace Inhibitors Other (See Comments)    unknown  . Aminoglycosides   . Erythromycin Nausea And Vomiting  . Levofloxacin Other (See Comments)    dehydration  . Penicillins Nausea And Vomiting  . Sulfa Antibiotics   . Ciprofloxacin Rash    hives    Medications:  Prior to Admission medications     Medication Sig Start Date End Date Taking? Authorizing Provider  Azelaic Acid (FINACEA) 15 % cream Apply 1 application topically 2 (two) times daily as needed. For Roscacea   Yes [provider]  levETIRAcetam (KEPPRA) 500 MG tablet Take 500 mg by mouth 2 (two) times daily. 02/24/16  Yes [provider]  levETIRAcetam (KEPPRA) 500 MG tablet 1/2 tablet in the morning and one tablet in the evening 08/18/16  Yes Kathrynn Ducking, MD  losartan (COZAAR) 50 MG tablet Take 0.5 tablets (25 mg total) by mouth daily. 01/06/17 04/06/17 Yes Lorretta Harp, MD  SALINE NASAL SPRAY NA Place 1 spray into the nose 2 (two) times daily.   Yes [provider]    ROS:  Out of a complete 14 system review of symptoms, the patient complains only of the following symptoms, and all other reviewed systems are negative.  Hearing loss, ringing in the ears, drooling Cough, shortness of breath Frequent waking Frequent infections of the left ear Frequency of urination Joint pain, back pain, achy muscles, muscle cramps Moles Memory loss, numbness, weakness  Blood pressure 119/70, pulse (!) 59, height 5\' 9"  (1.753 m), weight 161 lb (73 kg).  Physical Exam  General: The patient is alert and cooperative at the time of the examination.  Skin:  No significant peripheral edema is noted.   Neurologic Exam  Mental status: The patient is alert and oriented x 3 at the time of the examination. The patient has apparent normal recent and remote memory, with an apparently normal attention span and concentration ability.   Cranial nerves: Facial symmetry is present. Speech is hoarse and dysphonic, not aphasic. Extraocular movements are full. Visual fields are full.  Motor: The patient has good strength in all 4 extremities.  Sensory examination: Soft touch sensation is symmetric on the face, arms, and legs.  Coordination: The patient has good finger-nose-finger and heel-to-shin  bilaterally.  Gait and station: The patient has a normal gait. Tandem gait is slightly unsteady. Romberg is negative. No drift is seen.  Reflexes: Deep tendon reflexes are symmetric.   Assessment/Plan:  1. Nocturnal seizures  The patient is well controlled with the seizures, we will continue Keppra for now. He will follow-up in one year, sooner if needed.  Jill Alexanders MD 02/24/2017 10:17 AM  [facility] [address] [place], [place] [address]  Phone [phone] Fax [phone]

## 2017-02-27 ENCOUNTER — Telehealth: Payer: Self-pay | Admitting: Cardiovascular Disease

## 2017-02-27 NOTE — Telephone Encounter (Signed)
Received notes from [facility] for appointment on 03/15/17 with Dr Gwenlyn Found.  Records put with Dr Kennon Holter schedule for 03/15/17. lp

## 2017-03-01 ENCOUNTER — Encounter: Payer: Self-pay | Admitting: Cardiovascular Disease

## 2017-03-02 NOTE — Telephone Encounter (Signed)
Called prior auth to [facility] for Cozaar 50 mg--1/2 tab qd.  Called pt to let him know it was approved. Approval #: [id number] Valid through: 01/31/17-03/02/18  Pt verbalized thanks. Told pt I will send Rx to [facility].

## 2017-03-02 NOTE — Telephone Encounter (Signed)
Please call,pt says he needs his Cozaar,will be out of his medicine soon.

## 2017-03-02 NOTE — Telephone Encounter (Signed)
°  New Prob  Prior authorization for brand name Cozaar needed. [facility] is requesting the prior authorization not [facility]. However, was originally sent to [facility] and was denied as they do not manage his medications. Requesting contact with patient when it has been submitted.  Prior Auth for [facility]: [id number] Prior Auth Fax for [facility]: [phone]

## 2017-03-13 ENCOUNTER — Other Ambulatory Visit: Payer: Self-pay | Admitting: *Deleted

## 2017-03-13 MED ORDER — LOSARTAN POTASSIUM 50 MG PO TABS: 25.0000 mg | ORAL_TABLET | Freq: Every day | ORAL | 1 refills | Status: DC

## 2017-03-15 ENCOUNTER — Encounter: Payer: Self-pay | Admitting: Cardiovascular Disease

## 2017-03-15 ENCOUNTER — Ambulatory Visit (INDEPENDENT_AMBULATORY_CARE_PROVIDER_SITE_OTHER): Payer: Medicare Other | Admitting: Cardiovascular Disease

## 2017-03-15 VITALS — BP 122/74 | HR 68 | Ht 69.0 in | Wt 159.0 lb

## 2017-03-15 DIAGNOSIS — I712 Thoracic aortic aneurysm, without rupture, unspecified: Secondary | ICD-10-CM

## 2017-03-15 DIAGNOSIS — I10 Essential (primary) hypertension: Secondary | ICD-10-CM

## 2017-03-15 DIAGNOSIS — E7800 Pure hypercholesterolemia, unspecified: Secondary | ICD-10-CM

## 2017-03-15 NOTE — Assessment & Plan Note (Signed)
History of hyperlipidemia not on statin therapy followed by PCP.

## 2017-03-15 NOTE — Patient Instructions (Signed)
Your physician wants you to follow-up in: ONE YEAR WITH DR BERRY You will receive a reminder letter in the mail two months in advance. If you don't receive a letter, please call our office to schedule the follow-up appointment.   If you need a refill on your cardiac medications before your next appointment, please call your pharmacy.  

## 2017-03-15 NOTE — Progress Notes (Signed)
03/15/2017 Zachary Padilla   30-Jul-1928  [id number]  Primary Physician Lajean Manes, MD Primary Cardiologist: Lorretta Harp MD Lupe Carney, [place]  HPI:  Zachary Padilla is a 83 y.o. male thin and fit appearing married Caucasian male father of 2, grandfather 4 grandchildren who I saw last  03/15/16. He has a history of hypertension, mild hyperlipidemia and family history of heart disease as well as chronic right bundle branch block. His mother died of an MI at age 65. A Myoview stress test performed 01/19/12 was entirely normal. He denies chest pain or shortness of breath. A chest CT angiogram was performed 03/22/12 for evaluation of potential lung cancer did incidentally reveal a 4.3 cm descending thoracic aortic aneurysm which he has been asymptomatic fromthis was rechecked April of this year and was unchanged. He denies chest pain or shortness of breath. His most recent CT scan performed 11/18/15 revealed his ascending aneurysm measuring 4.3  cm at its maximum dimension, unchanged from prior studies. Since I saw me or go to remain currently stable and is completely asymptomatic.      Current Meds  Medication Sig  . Azelaic Acid (FINACEA) 15 % cream Apply 1 application topically 2 (two) times daily as needed. For Roscacea  . levETIRAcetam (KEPPRA) 500 MG tablet 1/2 tablet in the morning and one tablet in the evening  . losartan (COZAAR) 50 MG tablet Take 0.5 tablets (25 mg total) daily by mouth.  Mar[place] Kitchen SALINE NASAL SPRAY NA Place 1 spray into the nose 2 (two) times daily.     Allergies  Allergen Reactions  . Ace Inhibitors Other (See Comments)    unknown  . Aminoglycosides   . Erythromycin Nausea And Vomiting  . Levofloxacin Other (See Comments)    dehydration  . Penicillins Nausea And Vomiting  . Sulfa Antibiotics   . Ciprofloxacin Rash    hives    Social History   Socioeconomic History  . Marital status: Married    Spouse name: Not on file  . Number of children: 2  .  Years of education: 36  . Highest education level: Not on file  Social Needs  . Financial resource strain: Not on file  . Food insecurity - worry: Not on file  . Food insecurity - inability: Not on file  . Transportation needs - medical: Not on file  . Transportation needs - non-medical: Not on file  Occupational History  . Occupation: retired  Tobacco Use  . Smoking status: Former Research scientist (life sciences)  . Smokeless tobacco: Never Used  . Tobacco comment: Lives with spouse-independant in all ADLs. [employer] and [employer] at [employer], Married in "52"  Substance and Sexual Activity  . Alcohol use: No  . Drug use: No  . Sexual activity: Yes  Other Topics Concern  . Not on file  Social History Narrative   Patient occasionally drinks tea.   Patient is right handed.     Review of Systems: General: negative for chills, fever, night sweats or weight changes.  Cardiovascular: negative for chest pain, dyspnea on exertion, edema, orthopnea, palpitations, paroxysmal nocturnal dyspnea or shortness of breath Dermatological: negative for rash Respiratory: negative for cough or wheezing Urologic: negative for hematuria Abdominal: negative for nausea, vomiting, diarrhea, bright red blood per rectum, melena, or hematemesis Neurologic: negative for visual changes, syncope, or dizziness All other systems reviewed and are otherwise negative except as noted above.    Blood pressure 122/74, pulse 68, height 5\' 9"  (1.753 m),  weight 159 lb (72.1 kg).  General appearance: alert and no distress Neck: no adenopathy, no carotid bruit, no JVD, supple, symmetrical, trachea midline and thyroid not enlarged, symmetric, no tenderness/mass/nodules Lungs: clear to auscultation bilaterally Heart: regular rate and rhythm, S1, S2 normal, no murmur, click, rub or gallop Extremities: extremities normal, atraumatic, no cyanosis or edema Pulses: 2+ and symmetric Skin: Skin color, texture, turgor normal. No  rashes or lesions Neurologic: Alert and oriented X 3, normal strength and tone. Normal symmetric reflexes. Normal coordination and gait  EKG sinus rhythm at 68 with small inferior Q waves. I personally reviewed this EKG.  ASSESSMENT AND PLAN:   Essential hypertension History of essential hypertension blood pressure measured today at 122/74. He is on low-dose Cozaar with borderline renal insufficiency. Serum creatinine is in the the low 1.2 range followed by Dr. Marval Regal . Continue current meds at current dosing.  Hyperlipidemia History of hyperlipidemia not on statin therapy followed by PCP.  Thoracic aortic aneurysm History of a small thoracic aortic aneurysm measuring 4.3 cm by CT scan performed 11/18/15. I doubt whether this will grow to the point where he needs to be surgically or endovascularly fixed in his lifetime. I also agree with his nephrologist that probably not exposing him to contrast at his age with borderline renal insufficiency is the appropriate thing.      Lorretta Harp MD FACP,FACC,FAHA, [facility] 03/15/2017 11:49 AM

## 2017-03-15 NOTE — Assessment & Plan Note (Signed)
History of essential hypertension blood pressure measured today at 122/74. He is on low-dose Cozaar with borderline renal insufficiency. Serum creatinine is in the the low 1.2 range followed by Dr. Marval Regal . Continue current meds at current dosing.

## 2017-03-15 NOTE — Assessment & Plan Note (Signed)
History of a small thoracic aortic aneurysm measuring 4.3 cm by CT scan performed 11/18/15. I doubt whether this will grow to the point where he needs to be surgically or endovascularly fixed in his lifetime. I also agree with his nephrologist that probably not exposing him to contrast at his age with borderline renal insufficiency is the appropriate thing.

## 2017-03-30 IMAGING — US US CAROTID DUPLEX BILAT
1 series · 13 of 24 positions shown · non-contrast
Comparison: None.

CLINICAL DATA: Acute disorientation, cognitive changes, TIA
symptoms

EXAM:
BILATERAL CAROTID DUPLEX ULTRASOUND
TECHNIQUE: Gray scale imaging, color Doppler and duplex ultrasound were
performed of bilateral carotid and vertebral arteries in the neck.

[Series 1: us carotid duplex bilat · 0.08mm/px · 13 of 66 slices shown]
[im 1/66]
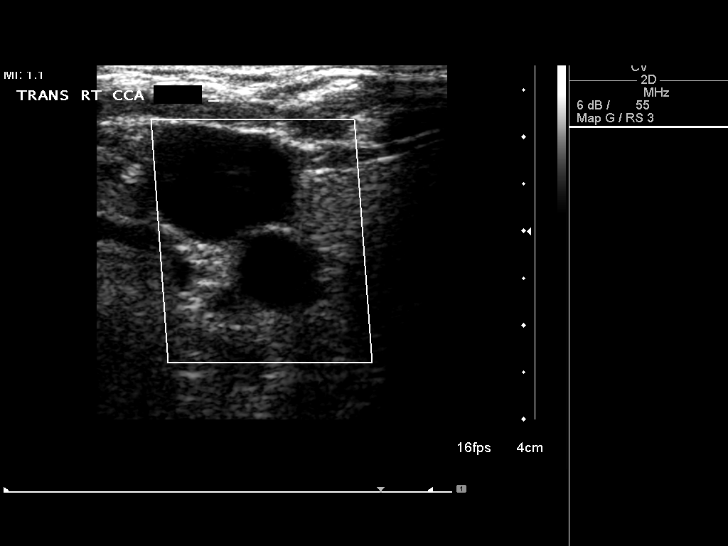
[im 6/66]
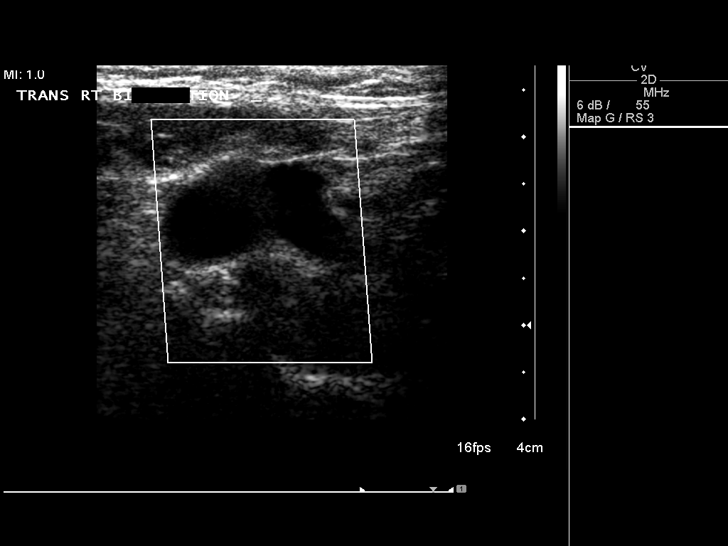
[im 12/66]
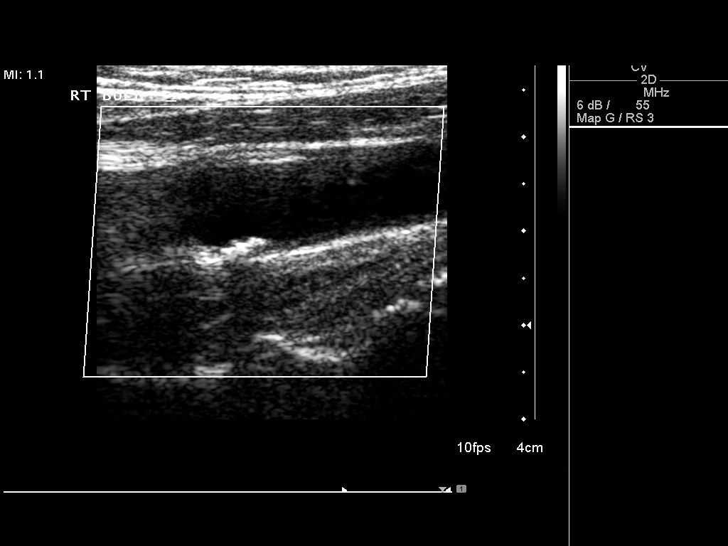
[im 17/66]
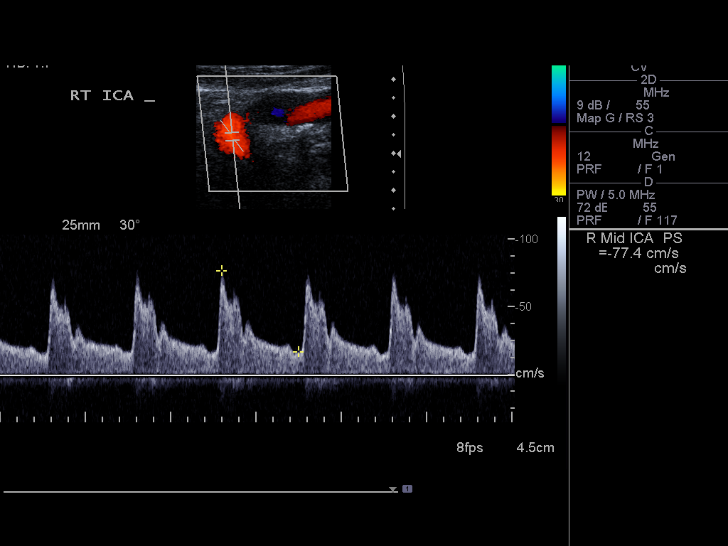
[im 23/66]
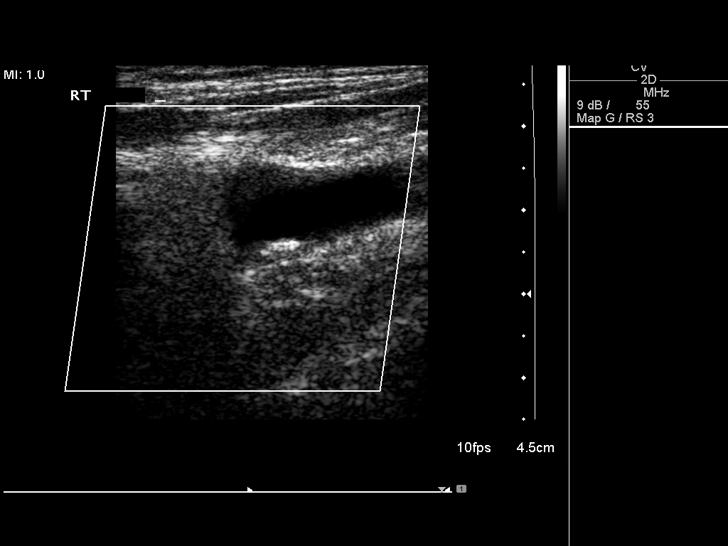
[im 29/66]
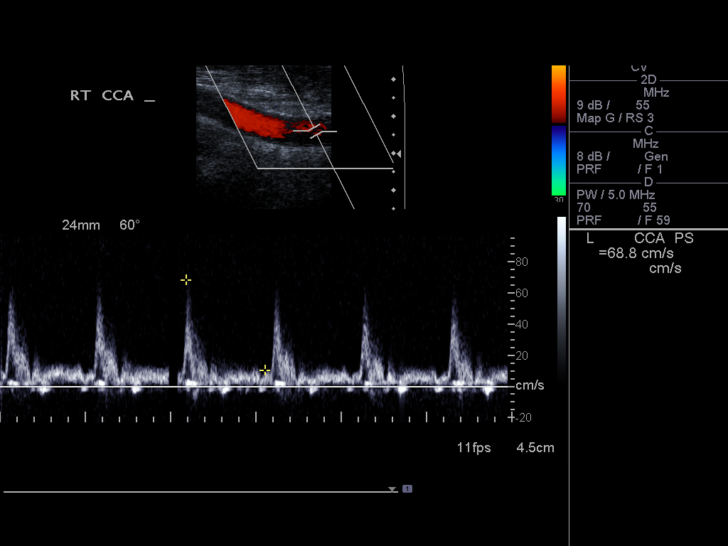
[im 34/66]
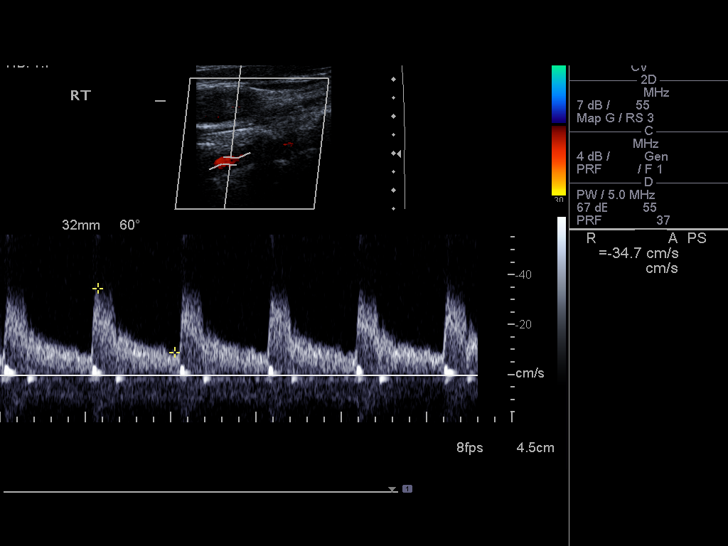
[im 37/66]
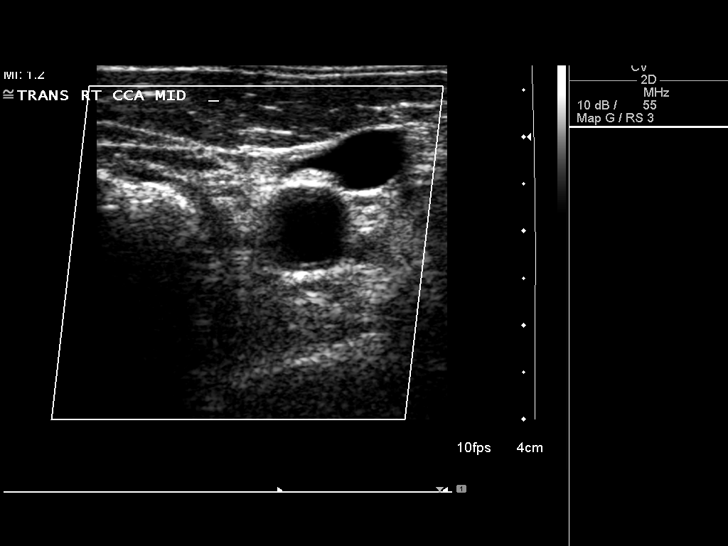
[im 43/66]
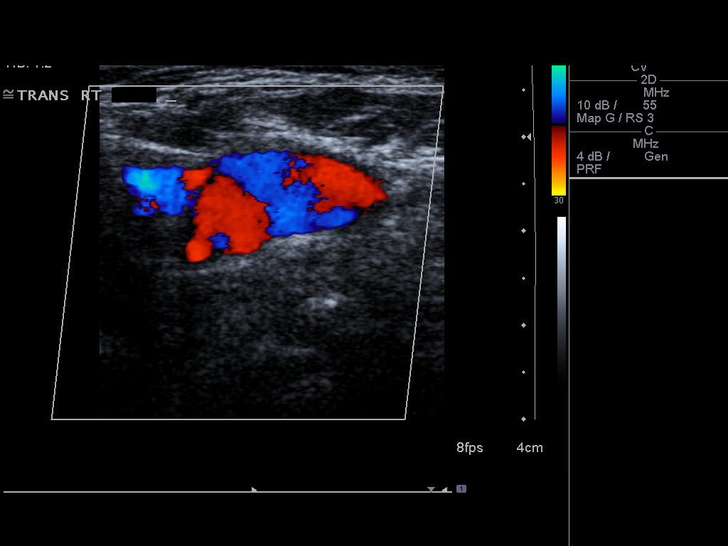
[im 49/66]
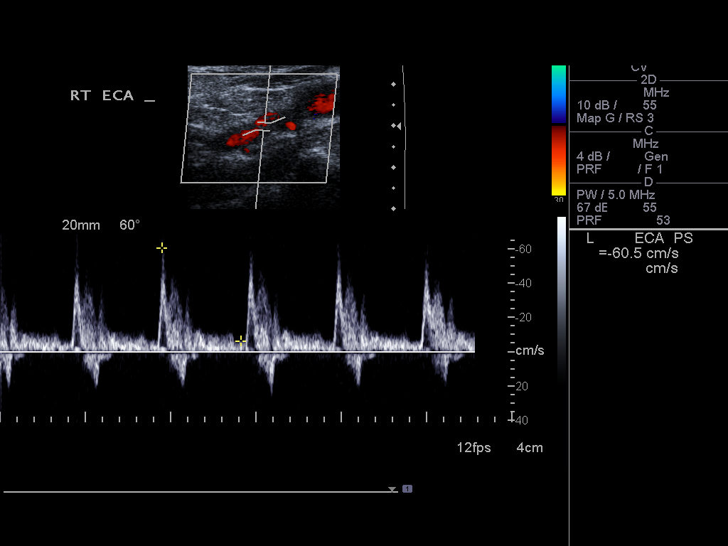
[im 54/66]
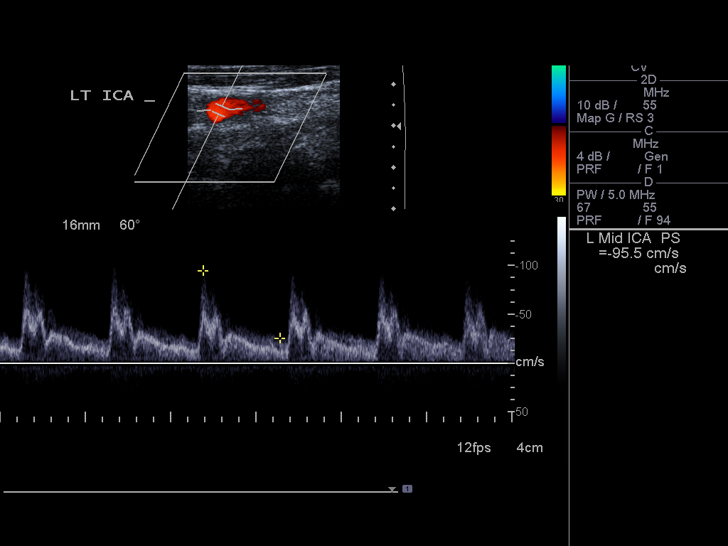
[im 60/66]
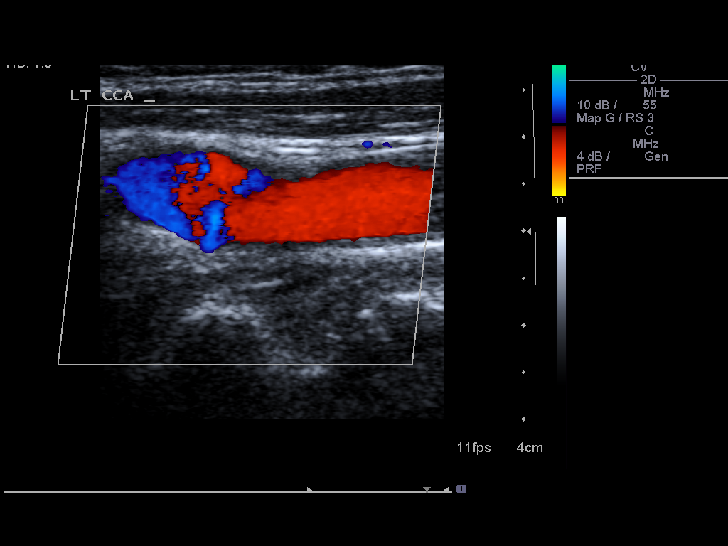
[im 66/66]
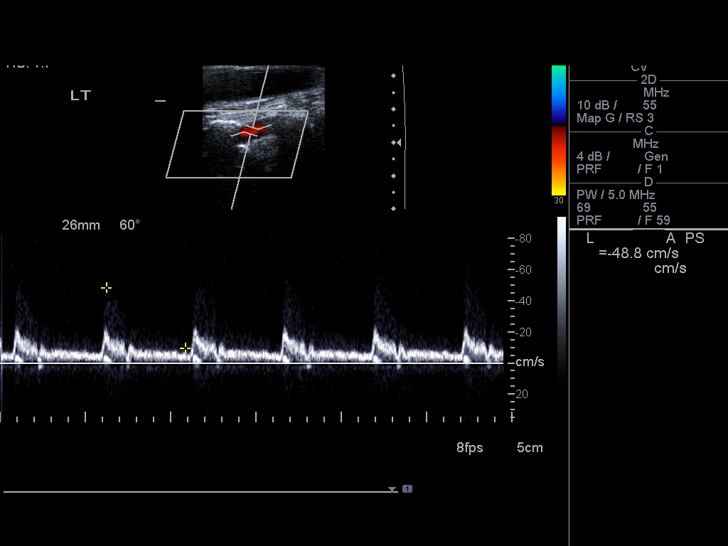

[13 of 24 positions shown; findings below may reference images not displayed]

FINDINGS: Criteria: Quantification of carotid stenosis is based on velocity
parameters that correlate the residual internal carotid diameter
with NASCET-based stenosis levels, using the diameter of the distal
internal carotid lumen as the denominator for stenosis measurement.

The following velocity measurements were obtained:

RIGHT

ICA:  88/16 cm/sec

CCA:  74/13 cm/sec

SYSTOLIC ICA/CCA RATIO:

DIASTOLIC ICA/CCA RATIO:

ECA:  67 cm/sec

LEFT

ICA:  96/26 cm/sec

CCA:  78/12 cm/sec

SYSTOLIC ICA/CCA RATIO:

DIASTOLIC ICA/CCA RATIO:

ECA:  61 cm/sec

RIGHT CAROTID ARTERY: Minor echogenic shadowing plaque formation. No
hemodynamically significant right ICA stenosis, velocity elevation,
or turbulent flow. Degree of narrowing less than 50%.

RIGHT VERTEBRAL ARTERY:  Antegrade

LEFT CAROTID ARTERY: Similar scattered minor echogenic plaque
formation. No hemodynamically significant left ICA stenosis,
velocity elevation, or turbulent flow.

LEFT VERTEBRAL ARTERY:  Antegrade
IMPRESSION: Minor carotid atherosclerosis. No hemodynamically significant ICA
stenosis. Degree of narrowing less than 50% bilaterally.

Patent antegrade vertebral flow bilaterally

## 2017-04-14 ENCOUNTER — Other Ambulatory Visit: Payer: Self-pay | Admitting: Cardiovascular Disease

## 2017-05-24 IMAGING — CT CT ANGIO CHEST
2 of 5 series · 18 of 36 positions shown · IV contrast (Omnipaque 300)
Comparison: CT angio chest of 08/15/2013

CLINICAL DATA: Followup of thoracic ascending aortic aneurysm

EXAM:
CT ANGIOGRAPHY CHEST WITH CONTRAST
TECHNIQUE: Multidetector CT imaging of the chest was performed using the
standard protocol during bolus administration of intravenous
contrast. Multiplanar CT image reconstructions and MIPs were
obtained to evaluate the vascular anatomy.
CONTRAST:  100mL OMNIPAQUE IOHEXOL 350 MG/ML SOLN

[Series 4: cta chest w/cm 3mm · axial · 0.62mm/px · z∈[-292,-38]mm · 17 of 93 slices shown]
[im 4/93  lung]
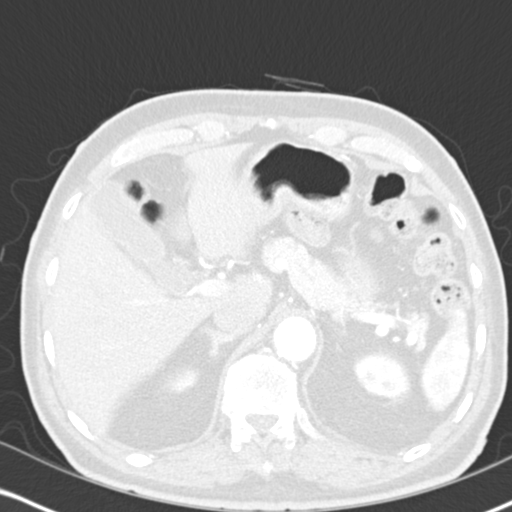
[im 11/93  mediastinal]
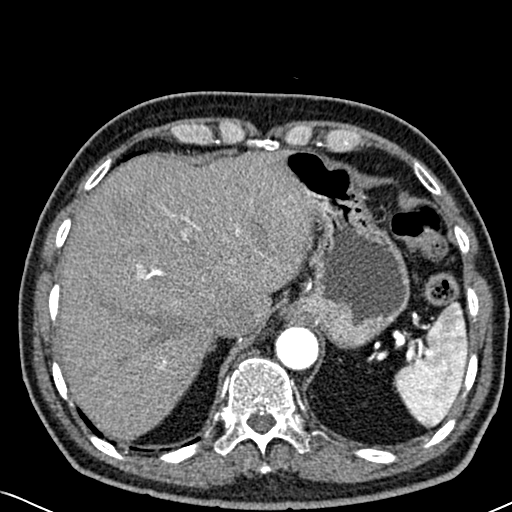
[im 14/93  lung]
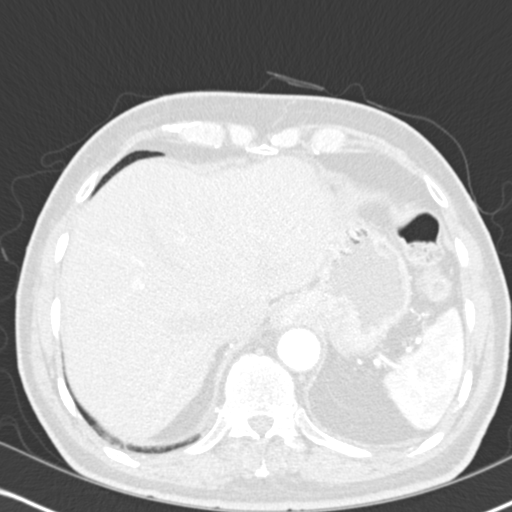
[im 21/93  mediastinal]
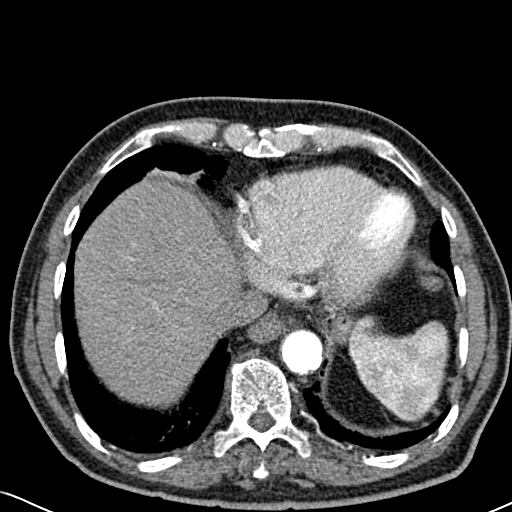
[im 24/93  lung]
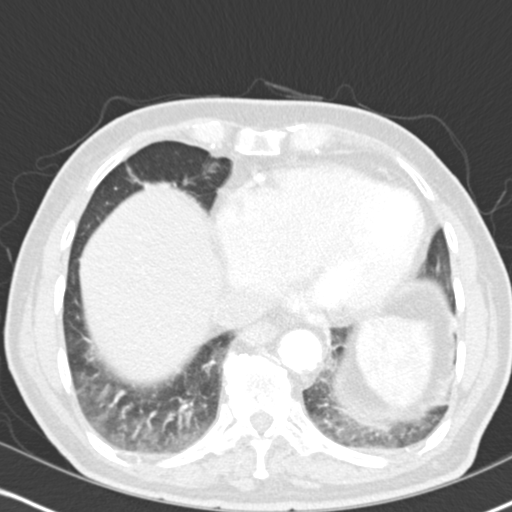
[im 31/93  mediastinal]
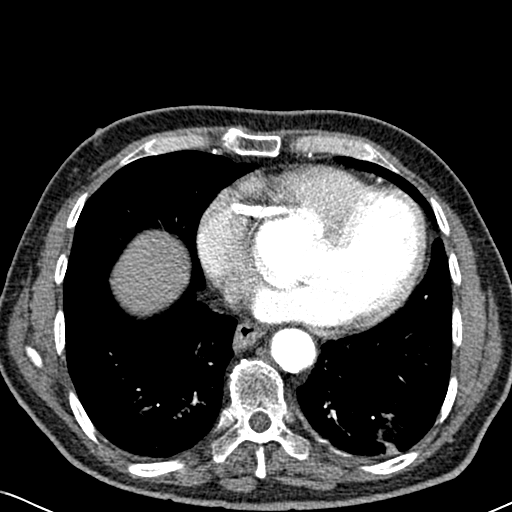
[im 35/93  lung]
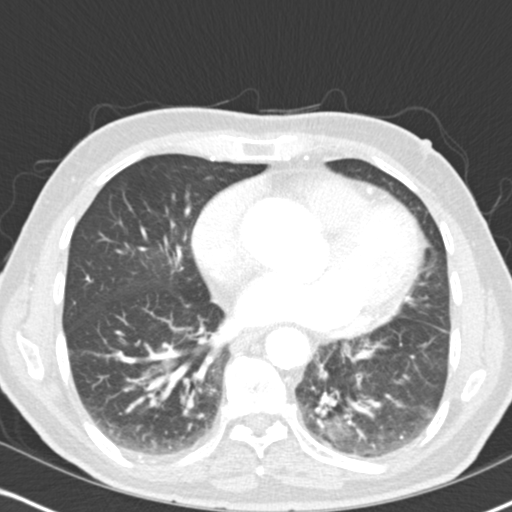
[im 41/93  mediastinal]
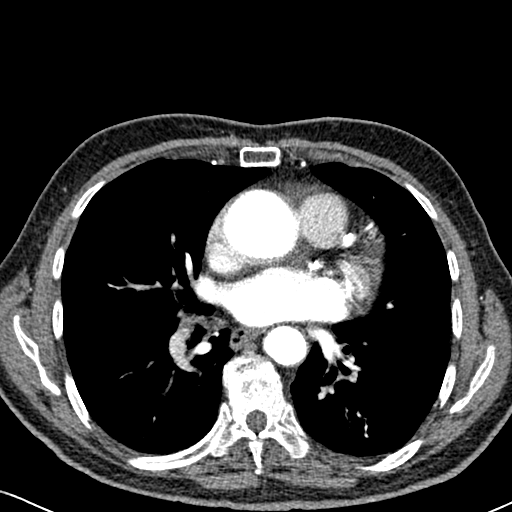
[im 48/93  lung]
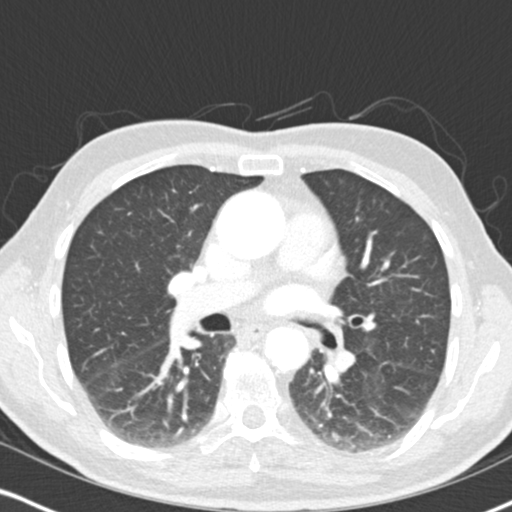
[im 52/93  mediastinal]
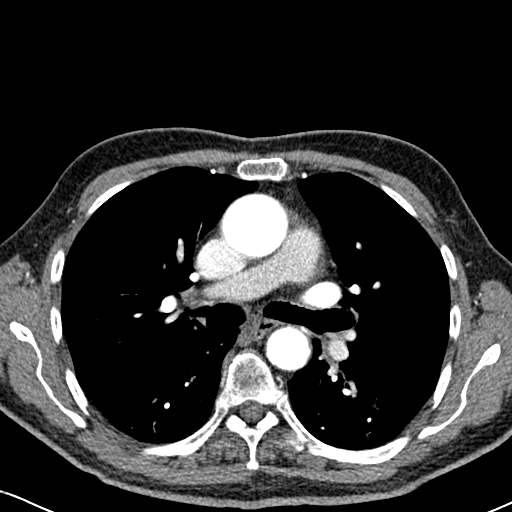
[im 58/93  lung]
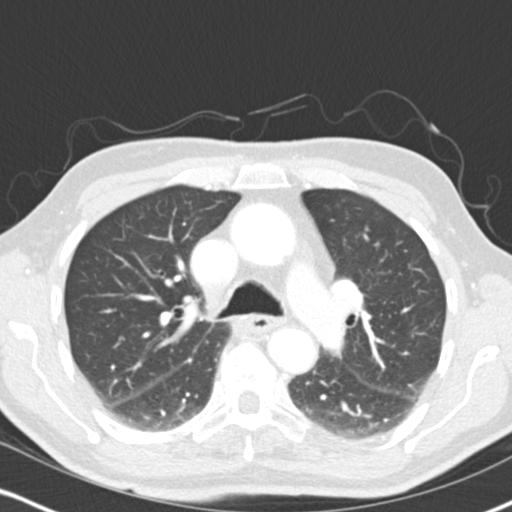
[im 62/93  mediastinal]
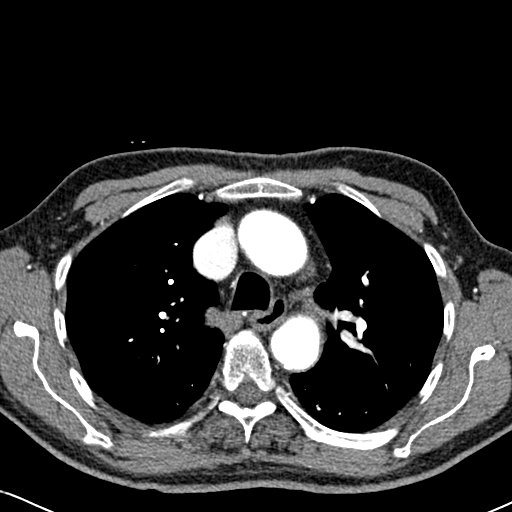
[im 69/93  lung]
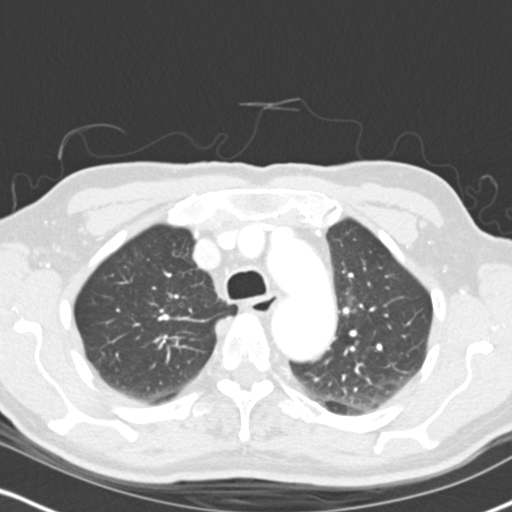
[im 72/93  mediastinal]
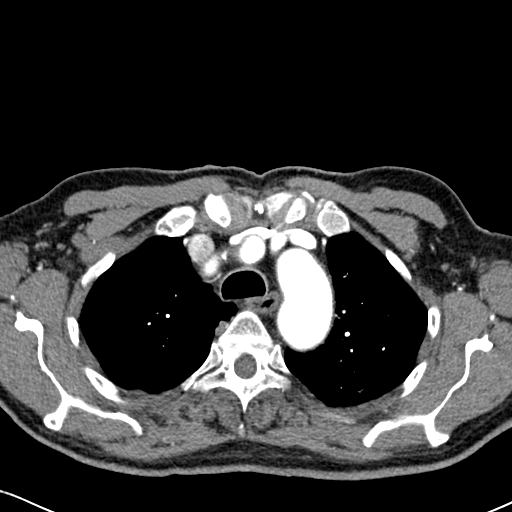
[im 79/93  lung]
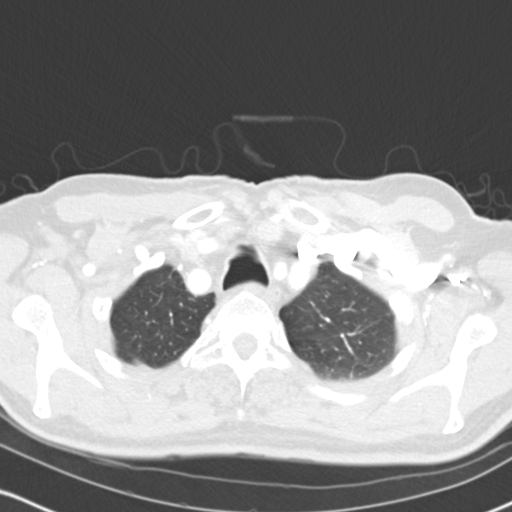
[im 82/93  mediastinal]
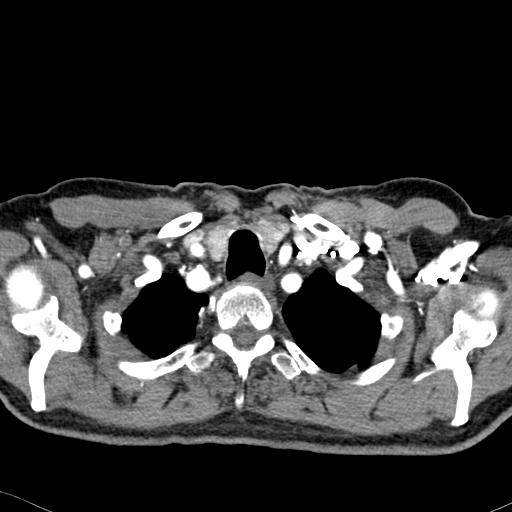
[im 89/93  lung]
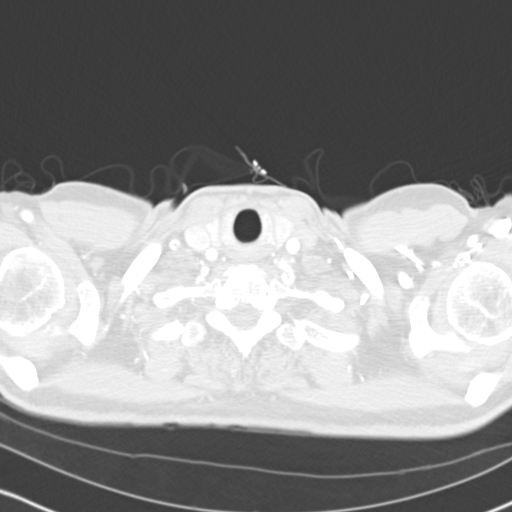

[Series 602: cor mpr · coronal · 0.62mm/px · 1 of 106 slices shown]
[im 53/106  mediastinal]
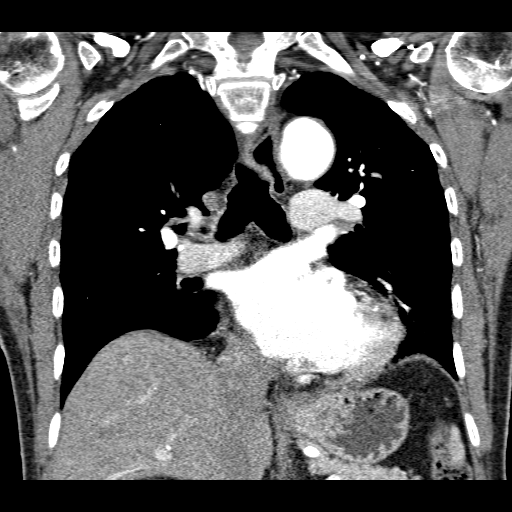

[18 of 36 positions shown; findings below may reference images not displayed]

FINDINGS: There is good opacification of the thoracic aorta. The mid ascending
thoracic aorta is difficult to measure with some motion present. The
best measurement is 4.0 x 4.0 cm compared to 4.2 x 4.2 cm
previously. Therefore no interval enlargement has occurred. The
distal thoracic aortic arch measures 3.8 cm compared to 3.9 cm
previously. The distal thoracic aorta just above the diaphragmatic
screw measures 2.6 cm. Diffuse atherosclerotic change is noted
within the descending thoracic aorta. Pulmonary artery segments are
stable as well although not as well opacified. No mediastinal or
hilar adenopathy is seen. There is little change in the small left
low-attenuation thyroid nodule previously described. Coronary artery
calcifications again are noted diffusely. Cardiomegaly is stable. No
pericardial effusion is seen.

On lung window images, there is slightly more patchy airspace
disease within the posterolateral left lower lobe possibly due to
atelectasis. Clinical followup is recommended to exclude developing
pneumonia. No pleural effusion is seen. The central airway is
patent. There are mild degenerative changes throughout the thoracic
spine.

Review of the MIP images confirms the above findings.
IMPRESSION: 1. Stable fusiform dilatation of the ascending aorta. There is some
motion making accurate assessment difficult but the best measurement
of the mid ascending thoracic aorta is 4.0 x 4.0 cm compared to
prior measurement of 4.2 x 4.2 cm.
2. Stable fusiform dilatation of the distal thoracic aortic arch
measuring 3.8 cm compared to 3.9 cm previously.
3. Atherosclerotic disease throughout the descending thoracic aorta.
Coronary artery calcifications.
4. Atelectasis versus developing pneumonia in the posterolateral
left lower lobe. Correlate clinically.

## 2017-08-10 ENCOUNTER — Other Ambulatory Visit: Payer: Self-pay | Admitting: Neurology

## 2017-08-18 ENCOUNTER — Telehealth: Payer: Self-pay | Admitting: Neurology

## 2017-08-18 NOTE — Telephone Encounter (Signed)
I called the patient.  The patient indicated that Dr. Arty Baumgartner prescribed vitamin D 50,000 international units once a week for 4 weeks and then go to once a month or 6 months total.  I have no problem with doing this, this should not interfere with the [place].  Patient is concerned that he may have some dizziness on the vitamin D.

## 2017-08-18 NOTE — Telephone Encounter (Signed)
Pt states his Kidney doctor has suggested Vitamin D for him and pt would like to discuss concerns of taking that with his levETIRAcetam (KEPPRA) 500 MG tablet Please call

## 2017-10-11 ENCOUNTER — Other Ambulatory Visit: Payer: Self-pay | Admitting: *Deleted

## 2017-10-11 MED ORDER — COZAAR 50 MG PO TABS: ORAL_TABLET | ORAL | 11 refills | Status: DC

## 2018-01-09 ENCOUNTER — Other Ambulatory Visit: Payer: Self-pay

## 2018-02-26 ENCOUNTER — Ambulatory Visit (INDEPENDENT_AMBULATORY_CARE_PROVIDER_SITE_OTHER): Payer: Medicare Other | Admitting: Adult Health

## 2018-02-26 ENCOUNTER — Encounter: Payer: Self-pay | Admitting: Adult Health

## 2018-02-26 VITALS — BP 105/60 | HR 65 | Ht 69.0 in | Wt 156.2 lb

## 2018-02-26 DIAGNOSIS — R569 Unspecified convulsions: Secondary | ICD-10-CM | POA: Diagnosis not present

## 2018-02-26 NOTE — Progress Notes (Signed)
I have read the note, and I agree with the clinical assessment and plan.  Marsalis Beaulieu K Kerri Asche   

## 2018-02-26 NOTE — Patient Instructions (Signed)
Your Plan:  Continue Keppra  If your symptoms worsen or you develop new symptoms please let us know.    Thank you for coming to see us at [facility]. I hope we have been able to provide you high quality care today.  You may receive a patient satisfaction survey over the next few weeks. We would appreciate your feedback and comments so that we may continue to improve ourselves and the health of our patients.  

## 2018-02-26 NOTE — Progress Notes (Signed)
PATIENT: Zachary Padilla DOB: 09-29-1928  REASON FOR VISIT: follow up HISTORY FROM: patient  HISTORY OF PRESENT ILLNESS: Today 02/26/18:  Zachary Padilla is an 84 year old male with a history of seizures.  He returns today for follow-up.  He remains on Keppra taking 250 mg in the morning 500 mg in the evening.  He denies any seizure events.  Denies any significant changes in his gait or balance.  No change in his mood.  Overall he feels that he is doing relatively well.  He returns today for evaluation.  HISTORY Zachary Padilla is an 84 year old right-handed [place] male with a history of nocturnal seizures. He has done well on Keppra, he is taking 250 mg the morning and 500 mg in the evening. He has not had any recurrence since last seen, he is operating a motor vehicle without difficulty. The patient is no longer having drowsiness with the new dosing of the Keppra. He is tolerating the medication well. He returns for an evaluation.  REVIEW OF SYSTEMS: Out of a complete 14 system review of symptoms, the patient complains only of the following symptoms, and all other reviewed systems are negative.  Fatigue, hearing loss, ringing in ears, drooling, frequent waking, daytime sleepiness, joint pain, back pain, muscle cramps, moles, frequency of urination, numbness  ALLERGIES: Allergies  Allergen Reactions  . Ace Inhibitors Other (See Comments)    unknown  . Aminoglycosides   . Erythromycin Nausea And Vomiting  . Levofloxacin Other (See Comments)    dehydration  . Penicillins Nausea And Vomiting  . Sulfa Antibiotics   . Ciprofloxacin Rash    hives    HOME MEDICATIONS: Outpatient Medications Prior to Visit  Medication Sig Dispense Refill  . Azelaic Acid (FINACEA) 15 % cream Apply 1 application topically 2 (two) times daily as needed. For Roscacea    . guaiFENesin (MUCINEX) 600 MG 12 hr tablet Take by mouth 2 (two) times daily.    Marland Kitchen levETIRAcetam (KEPPRA) 500 MG tablet TAKE 1/2 TABLET IN THE  MORNING AND TAKE 1 TABLET IN THE EVENING 135 tablet 2  . losartan (COZAAR) 25 MG tablet Take 25 mg by mouth daily.    Marland Kitchen SALINE NASAL SPRAY NA Place 1 spray into the nose 2 (two) times daily.    Marland Kitchen COZAAR 50 MG tablet TAKE ONE TABLET EACH DAY 30 tablet 11   No facility-administered medications prior to visit.     PAST MEDICAL HISTORY: Past Medical History:  Diagnosis Date  . 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 ALLERGIC RHINITIS 02/23/2009  . COLONIC POLYPS, HX OF 02/23/2009  . Convulsions/seizures ([place]) 10/24/2014  . DIVERTICULITIS, HX OF 02/23/2009  . Ear infection   . Family history of heart disease   . GERD 02/23/2009  . Hyperlipidemia   . HYPERTENSION 02/23/2009  . PONV (postoperative nausea and vomiting)   . Pre-syncope   . Thoracic aortic aneurysm ([place])   . URINARY INCONTINENCE 02/23/2009    PAST SURGICAL HISTORY: Past Surgical History:  Procedure Laterality Date  . APPENDECTOMY  1946  . CATARACT EXTRACTION Bilateral   . Left ear tube  2001  . Removal of pre-cancer bump on neck  2005  . Ruptured appendics  1946  . Sinus surgery  2008    FAMILY HISTORY: Family History  Problem Relation Age of Onset  . Heart disease Mother   . Stroke Father   . Heart disease Father   . Cancer Sister   . Cancer Brother   . Heart disease  Other   . Stroke Other     SOCIAL HISTORY: Social History   Socioeconomic History  . Marital status: Married    Spouse name: Not on file  . Number of children: 2  . Years of education: 60  . Highest education level: Not on file  Occupational History  . Occupation: retired  Scientific laboratory technician  . Financial resource strain: Not on file  . Food insecurity:    Worry: Not on file    Inability: Not on file  . Transportation needs:    Medical: Not on file    Non-medical: Not on file  Tobacco Use  . Smoking status: Former Research scientist (life sciences)  . Smokeless tobacco: Never Used  . Tobacco comment: Lives with spouse-independant in all ADLs. [employer] and [employer] at  [employer], Married in "52"  Substance and Sexual Activity  . Alcohol use: No  . Drug use: No  . Sexual activity: Yes  Lifestyle  . Physical activity:    Days per week: Not on file    Minutes per session: Not on file  . Stress: Not on file  Relationships  . Social connections:    Talks on phone: Not on file    Gets together: Not on file    Attends religious service: Not on file    Active member of club or organization: Not on file    Attends meetings of clubs or organizations: Not on file    Relationship status: Not on file  . Intimate partner violence:    Fear of current or ex partner: Not on file    Emotionally abused: Not on file    Physically abused: Not on file    Forced sexual activity: Not on file  Other Topics Concern  . Not on file  Social History Narrative   Patient occasionally drinks tea.   Patient is right handed.      PHYSICAL EXAM  Vitals:   02/26/18 1116  BP: 105/60  Pulse: 65  Weight: 156 lb 3.2 oz (70.9 kg)  Height: 5\' 9"  (1.753 m)   Body mass index is 23.07 kg/m.  Generalized: Well developed, in no acute distress   Neurological examination  Mentation: Alert oriented to time, place, history taking. Follows all commands speech and language fluent Cranial nerve II-XII: Pupils were equal round reactive to light. Extraocular movements were full, visual field were full on confrontational test. Facial sensation and strength were normal. Uvula tongue midline. Head turning and shoulder shrug  were normal and symmetric. Motor: The motor testing reveals 5 over 5 strength of all 4 extremities. Good symmetric motor tone is noted throughout.  Sensory: Sensory testing is intact to soft touch on all 4 extremities. No evidence of extinction is noted.  Coordination: Cerebellar testing reveals good finger-nose-finger and heel-to-shin bilaterally.  Gait and station: Gait is normal.  Reflexes: Deep tendon reflexes are symmetric and normal  bilaterally.   DIAGNOSTIC DATA (LABS, IMAGING, TESTING) - I reviewed patient records, labs, notes, testing and imaging myself where available.  Lab Results  Component Value Date   WBC 5.5 03/22/2016   HGB 14.3 03/22/2016   HCT 41.9 03/22/2016   MCV 96.5 03/22/2016   PLT 160 03/22/2016      Component Value Date/Time   NA 134 (L) 03/22/2016 1128   K 4.7 03/22/2016 1128   CL 101 03/22/2016 1128   CO2 27 03/22/2016 1128   GLUCOSE 80 03/22/2016 1128   BUN 17 03/22/2016 1128   CREATININE 1.27 (  H) 03/22/2016 1128   CALCIUM 8.7 03/22/2016 1128   PROT 6.6 03/22/2016 1128   ALBUMIN 3.9 03/22/2016 1128   AST 22 03/22/2016 1128   ALT 15 03/22/2016 1128   ALKPHOS 73 03/22/2016 1128   BILITOT 1.0 03/22/2016 1128   GFRNONAA 51 (L) 03/22/2016 1128   GFRAA 59 (L) 03/22/2016 1128   Lab Results  Component Value Date   CHOL 123 03/22/2016   HDL 50 03/22/2016   LDLCALC 63 03/22/2016   TRIG 52 03/22/2016   CHOLHDL 2.5 03/22/2016   No results found for: HGBA1C No results found for: VITAMINB12 Lab Results  Component Value Date   TSH 1.16 03/22/2016      ASSESSMENT AND PLAN 84 y.o. year old male  has a past medical history of ACNE ROSACEA (09/09/2009), ALLERGIC RHINITIS (02/23/2009), COLONIC POLYPS, HX OF (02/23/2009), Convulsions/seizures ([place]) (10/24/2014), DIVERTICULITIS, HX OF (02/23/2009), Ear infection, Family history of heart disease, GERD (02/23/2009), Hyperlipidemia, HYPERTENSION (02/23/2009), PONV (postoperative nausea and vomiting), Pre-syncope, Thoracic aortic aneurysm ([place]), and URINARY INCONTINENCE (02/23/2009). here with:  1.  Seizures  Overall the patient has remained stable.  He will continue on Keppra 250 mg in the morning and 500 mg daily.  He is advised that if his symptoms worsen or he develops new symptoms he should let us know.  He will follow-up in 6 months or sooner if needed.  I spent 15 minutes with the patient. 50% of this time was spent reviewing the patient's  plan of care   Ward Givens, MSN, NP-C 02/26/2018, 11:26 AM [facility] [address], [place], [place] [address] [phone]

## 2018-03-23 ENCOUNTER — Telehealth: Payer: Self-pay | Admitting: Cardiovascular Disease

## 2018-03-23 MED ORDER — LOSARTAN POTASSIUM 25 MG PO TABS: 25.0000 mg | ORAL_TABLET | Freq: Every day | ORAL | 0 refills | Status: DC

## 2018-03-23 NOTE — Telephone Encounter (Signed)
New Message    *STAT* If patient is at the pharmacy, call can be transferred to refill team.   1. Which medications need to be refilled? (please list name of each medication and dose if known) losartan (COZAAR) 25 MG tablet  2. Which pharmacy/location (including street and city if local pharmacy) is medication to be sent to?[facility] [place], [place] - [address]  3. Do they need a 30 day or 90 day supply? 90 day

## 2018-03-28 ENCOUNTER — Ambulatory Visit (INDEPENDENT_AMBULATORY_CARE_PROVIDER_SITE_OTHER): Payer: Medicare Other | Admitting: Cardiovascular Disease

## 2018-03-28 ENCOUNTER — Encounter: Payer: Self-pay | Admitting: Cardiovascular Disease

## 2018-03-28 DIAGNOSIS — I10 Essential (primary) hypertension: Secondary | ICD-10-CM

## 2018-03-28 DIAGNOSIS — I712 Thoracic aortic aneurysm, without rupture, unspecified: Secondary | ICD-10-CM

## 2018-03-28 DIAGNOSIS — E7800 Pure hypercholesterolemia, unspecified: Secondary | ICD-10-CM | POA: Diagnosis not present

## 2018-03-28 NOTE — Patient Instructions (Signed)
Medication Instructions:  Your physician recommends that you continue on your current medications as directed. Please refer to the Current Medication list given to you today.  If you need a refill on your cardiac medications before your next appointment, please call your pharmacy.   Lab work: none If you have labs (blood work) drawn today and your tests are completely normal, you will receive your results only by: . MyChart Message (if you have MyChart) OR . A paper copy in the mail If you have any lab test that is abnormal or we need to change your treatment, we will call you to review the results.  Testing/Procedures: none  Follow-Up: At [facility], you and your health needs are our priority.  As part of our continuing mission to provide you with exceptional heart care, we have created designated Provider Care Teams.  These Care Teams include your primary Cardiologist (physician) and Advanced Practice Providers (APPs -  Physician Assistants and Nurse Practitioners) who all work together to provide you with the care you need, when you need it. You will need a follow up appointment in 12 months.  Please call our office 2 months in advance to schedule this appointment.  You may see Dr. Berry or one of the following Advanced Practice Providers on your designated Care Team:   Luke Kilroy, PA-C Krista Kroeger, PA-C . Callie Goodrich, PA-C  Any Other Special Instructions Will Be Listed Below (If Applicable).    

## 2018-03-28 NOTE — Assessment & Plan Note (Signed)
3 of essential hypertension blood pressure measured today 112/58.  He is on low-dose Cozaar.

## 2018-03-28 NOTE — Assessment & Plan Note (Signed)
History of thoracic aortic aneurysm measuring 43 mm by CTA 2 years ago.  Given his age and his renal insufficiency I do not think is prudent to continue to follow this by CT imaging.

## 2018-03-28 NOTE — Progress Notes (Signed)
03/28/2018 Zachary Padilla   Oct 28, 1928  [id number]  Primary Physician Lajean Manes, MD Primary Cardiologist: Lorretta Harp MD Garret Reddish, [place], [place]  HPI:  Zachary Padilla is a 84 y.o.  thin and fit appearing married Caucasian male father of 2, grandfather 4 grandchildren who I saw last  03/15/2017. He has a history of hypertension, mild hyperlipidemia and family history of heart disease as well as chronic right bundle branch block. His mother died of an MI at age 28. A Myoview stress test performed 01/19/12 was entirely normal. He denies chest pain or shortness of breath. A chest CT angiogram was performed 03/22/12 for evaluation of potential lung cancer did incidentally reveal a 4.3 cm descending thoracic aortic aneurysm which he has been asymptomatic fromthis was rechecked April of this year and was unchanged. He denies chest pain or shortness of breath. His most recent CT scan performed7/12/17revealed his ascending aneurysm measuring4.3cm at its maximum dimension,unchanged from prior studies. Since I saw me or go to remain currently stable and is completely asymptomatic.  He does have some mild left ankle edema.  He was on amlodipine which was discontinued.  He sees Dr. Marval Regal , his nephrologist, for stage III CKD.   Current Meds  Medication Sig  . Azelaic Acid (FINACEA) 15 % cream Apply 1 application topically 2 (two) times daily as needed. For Roscacea  . guaiFENesin (MUCINEX) 600 MG 12 hr tablet Take by mouth as needed.   . levETIRAcetam (KEPPRA) 500 MG tablet TAKE 1/2 TABLET IN THE MORNING AND TAKE 1 TABLET IN THE EVENING  . losartan (COZAAR) 25 MG tablet Take 1 tablet (25 mg total) by mouth daily.  Marland Kitchen SALINE NASAL SPRAY NA Place 1 spray into the nose 2 (two) times daily.     Allergies  Allergen Reactions  . Ace Inhibitors Other (See Comments)    unknown  . Aminoglycosides   . Erythromycin Nausea And Vomiting  . Levofloxacin Other (See Comments)    dehydration  .  Penicillins Nausea And Vomiting  . Sulfa Antibiotics   . Ciprofloxacin Rash    hives    Social History   Socioeconomic History  . Marital status: Married    Spouse name: Not on file  . Number of children: 2  . Years of education: 20  . Highest education level: Not on file  Occupational History  . Occupation: retired  Scientific laboratory technician  . Financial resource strain: Not on file  . Food insecurity:    Worry: Not on file    Inability: Not on file  . Transportation needs:    Medical: Not on file    Non-medical: Not on file  Tobacco Use  . Smoking status: Former Research scientist (life sciences)  . Smokeless tobacco: Never Used  . Tobacco comment: Lives with spouse-independant in all ADLs. [employer] and [employer] at [employer], Married in "52"  Substance and Sexual Activity  . Alcohol use: No  . Drug use: No  . Sexual activity: Yes  Lifestyle  . Physical activity:    Days per week: Not on file    Minutes per session: Not on file  . Stress: Not on file  Relationships  . Social connections:    Talks on phone: Not on file    Gets together: Not on file    Attends religious service: Not on file    Active member of club or organization: Not on file    Attends meetings of clubs or organizations: Not on  file    Relationship status: Not on file  . Intimate partner violence:    Fear of current or ex partner: Not on file    Emotionally abused: Not on file    Physically abused: Not on file    Forced sexual activity: Not on file  Other Topics Concern  . Not on file  Social History Narrative   Patient occasionally drinks tea.   Patient is right handed.     Review of Systems: General: negative for chills, fever, night sweats or weight changes.  Cardiovascular: negative for chest pain, dyspnea on exertion, edema, orthopnea, palpitations, paroxysmal nocturnal dyspnea or shortness of breath Dermatological: negative for rash Respiratory: negative for cough or wheezing Urologic: negative for  hematuria Abdominal: negative for nausea, vomiting, diarrhea, bright red blood per rectum, melena, or hematemesis Neurologic: negative for visual changes, syncope, or dizziness All other systems reviewed and are otherwise negative except as noted above.    Blood pressure (!) 112/58, pulse 61, height 5\' 9"  (1.753 m), weight 159 lb 3.2 oz (72.2 kg).  General appearance: alert and no distress Neck: no adenopathy, no carotid bruit, no JVD, supple, symmetrical, trachea midline and thyroid not enlarged, symmetric, no tenderness/mass/nodules Lungs: clear to auscultation bilaterally Heart: regular rate and rhythm, S1, S2 normal, no murmur, click, rub or gallop Extremities: extremities normal, atraumatic, no cyanosis or edema Pulses: 2+ and symmetric Skin: Skin color, texture, turgor normal. No rashes or lesions Neurologic: Alert and oriented X 3, normal strength and tone. Normal symmetric reflexes. Normal coordination and gait  EKG sinus rhythm at 61 with first-degree AV block and right bundle branch block.  I personally reviewed this EKG.  ASSESSMENT AND PLAN:   Essential hypertension 3 of essential hypertension blood pressure measured today 112/58.  He is on low-dose Cozaar.  Hyperlipidemia History of hyperlipidemia not on statin therapy  Thoracic aortic aneurysm History of thoracic aortic aneurysm measuring 43 mm by CTA 2 years ago.  Given his age and his renal insufficiency I do not think is prudent to continue to follow this by CT imaging.      Lorretta Harp MD FACP,FACC,FAHA, [facility] 03/28/2018 11:30 AM

## 2018-03-28 NOTE — Assessment & Plan Note (Signed)
History of hyperlipidemia not on statin therapy. 

## 2018-04-02 ENCOUNTER — Telehealth: Payer: Self-pay | Admitting: Cardiovascular Disease

## 2018-04-02 MED ORDER — LOSARTAN POTASSIUM 50 MG PO TABS: 25.0000 mg | ORAL_TABLET | Freq: Every day | ORAL | 11 refills | Status: DC

## 2018-04-02 NOTE — Telephone Encounter (Signed)
New message     .Pt c/o medication issue:  1. Name of Medication: losartan (COZAAR) 25 MG tablet  2. How are you currently taking this medication (dosage and times per day)? 1 time daily  3. Are you having a reaction (difficulty breathing--STAT)?no   4. What is your medication issue? Patient states that he can't take a generic in this medication. He wants to take the Brand name in this medication.

## 2018-04-02 NOTE — Telephone Encounter (Signed)
Pt is wanting 50 mg of losartan and will cut in 1/2 to take 25 mg per orders Pt's  kidney MD may increase med to 25 mg bid .Waiting to see if kidneys can tolerate increase .[name]

## 2018-04-10 ENCOUNTER — Telehealth: Payer: Self-pay | Admitting: Cardiovascular Disease

## 2018-04-10 NOTE — Telephone Encounter (Signed)
Attempted to return call to determine why he cannot take generic losartan. Phone rang with no answer

## 2018-04-10 NOTE — Telephone Encounter (Signed)
New message   Pt c/o medication issue:  1. Name of Medication: COZAAR  2. How are you currently taking this medication (dosage and times per day)?1 time daily  3. Are you having a reaction (difficulty breathing--STAT)?n/a  4. What is your medication issue?Patient states that he can't take the generic in this medication. The patient wants the brand name in this medication. Please call to discuss.

## 2018-04-11 NOTE — Telephone Encounter (Signed)
Returned call to patient - spoke with lady answering and patient is not available. She will have patient call back when he is able.

## 2018-04-11 NOTE — Telephone Encounter (Signed)
Attempted to call patient. He is in shower. Lady answering asked that I call back

## 2018-04-12 NOTE — Telephone Encounter (Signed)
Spoke with pt. Pt sts that he needs a prior auth for his Cozaar. Pt sts that he can only take the brand name medication. When I inquired why the pt became upset and sts that he has taken it like that for years and that Dr.Berry knows that he has a lot allergies to different things.  Adv pt that I will fwd the rqdt to Dr.Berry's nurse to complete the prior auth. Pt provide his ins prior auth tel# [phone]. Pt sts that he is running low and his supply. Adv pt that I will fwd the message to Dr.Berry's nurse to address.

## 2018-04-13 NOTE — Telephone Encounter (Signed)
Information for PA for cozaar entered through cover my meds.

## 2018-04-13 NOTE — Telephone Encounter (Signed)
Spoke with patient. He states he has spoken with the prior [place] department with insurance. He states they are ready to fill the medication but call needs to be made to insurance for approval.   Hilda Blades RN aware

## 2018-04-16 ENCOUNTER — Other Ambulatory Visit: Payer: Self-pay | Admitting: *Deleted

## 2018-04-16 MED ORDER — COZAAR 50 MG PO TABS: 50.0000 mg | ORAL_TABLET | Freq: Every day | ORAL | 3 refills | Status: DC

## 2018-05-08 ENCOUNTER — Other Ambulatory Visit: Payer: Self-pay | Admitting: Neurology

## 2018-08-20 ENCOUNTER — Other Ambulatory Visit: Payer: Self-pay

## 2018-08-21 ENCOUNTER — Telehealth: Payer: Self-pay | Admitting: Cardiovascular Disease

## 2018-08-21 NOTE — Telephone Encounter (Signed)
New Message:   Patient calling concerning some medications that he is taken. Please call patient.

## 2018-08-21 NOTE — Telephone Encounter (Signed)
Unable to reach pt or leave a message  

## 2018-08-22 MED ORDER — COZAAR 50 MG PO TABS: 50.0000 mg | ORAL_TABLET | Freq: Every day | ORAL | 3 refills | Status: DC

## 2018-08-22 NOTE — Telephone Encounter (Signed)
° ° °  Pt c/o medication issue:  1. Name of Medication: COZAAR 50 MG tablet  2. How are you currently taking this medication (dosage and times per day)? n/a  3. Are you having a reaction (difficulty breathing--STAT)? no  4. What is your medication issue? Patient wants to verify dosage.

## 2018-08-22 NOTE — Telephone Encounter (Signed)
Patient wanted  make sure about  Directions on his bottle of Cozaar ( brand name) states - take one tablet 50 mg  a day . Patient states he has been taking 1/2 tablet 25 mg  - per Dr Cecelia Byars ( [place]) and Dr Gwenlyn Found is aware.  RN INFORMED PATIENT TO CONTINUE  TO TAKE MEDICATION AS HE HAS  1/2 TABLET  A DAY.  THE DIRECT INO WILL NOT CHANGE ON MEDICATION BOTTLE

## 2018-08-22 NOTE — Addendum Note (Signed)
Addended by: Raiford Simmonds on: 08/22/2018 03:17 PM   Modules accepted: Orders

## 2018-09-05 ENCOUNTER — Telehealth: Payer: Self-pay

## 2018-10-08 MED ORDER — COZAAR 50 MG PO TABS: 50.0000 mg | ORAL_TABLET | Freq: Every day | ORAL | 3 refills | Status: DC

## 2018-10-08 NOTE — Telephone Encounter (Signed)
Refill

## 2019-02-01 ENCOUNTER — Other Ambulatory Visit: Payer: Self-pay | Admitting: Adult Health

## 2019-02-05 ENCOUNTER — Other Ambulatory Visit: Payer: Self-pay

## 2019-02-05 NOTE — Telephone Encounter (Signed)
Refill

## 2019-02-06 ENCOUNTER — Other Ambulatory Visit: Payer: Self-pay

## 2019-03-04 ENCOUNTER — Other Ambulatory Visit: Payer: Self-pay

## 2019-03-04 ENCOUNTER — Ambulatory Visit (INDEPENDENT_AMBULATORY_CARE_PROVIDER_SITE_OTHER): Payer: Medicare Other | Admitting: Adult Health

## 2019-03-04 ENCOUNTER — Encounter: Payer: Self-pay | Admitting: Adult Health

## 2019-03-04 VITALS — BP 118/70 | HR 64 | Temp 97.5°F | Ht 68.0 in | Wt 158.8 lb

## 2019-03-04 DIAGNOSIS — R569 Unspecified convulsions: Secondary | ICD-10-CM

## 2019-03-04 DIAGNOSIS — R413 Other amnesia: Secondary | ICD-10-CM | POA: Diagnosis not present

## 2019-03-04 NOTE — Progress Notes (Signed)
PATIENT: Zachary Padilla DOB: February 17, 1929  REASON FOR VISIT: follow up HISTORY FROM: patient  HISTORY OF PRESENT ILLNESS: Today 03/04/19:  [name] is an 85 year old male with a history of seizures.  He returns today for follow-up.  He continues to do well.  He denies any seizure events.  He remains on Keppra taking 250 mg in the morning and 500 mg in the evening.  No change in his mood or behavior.  He does report that he is noticed some changes with his memory.  He reports that he may not remember people's names that he should be familiar with.  He states that typically he can recall it later.  He is able to complete all ADLs independently.  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  He returns today for an evaluation.  HISTORY 02/26/18:  [name] is an 85 year old male with a history of seizures.  He returns today for follow-up.  He remains on Keppra taking 250 mg in the morning 500 mg in the evening.  He denies any seizure events.  Denies any significant changes in his gait or balance.  No change in his mood.  Overall he feels that he is doing relatively well.  He returns today for evaluation.  REVIEW OF SYSTEMS: Out of a complete 14 system review of symptoms, the patient complains only of the following symptoms, and all other reviewed systems are negative.  See HPI  ALLERGIES: Allergies  Allergen Reactions  . Ace Inhibitors Other (See Comments)    unknown  . Aminoglycosides   . Erythromycin Nausea And Vomiting  . Levofloxacin Other (See Comments)    dehydration  . Penicillins Nausea And Vomiting  . Sulfa Antibiotics   . Ciprofloxacin Rash    hives    HOME MEDICATIONS: Outpatient Medications Prior to Visit  Medication Sig Dispense Refill  . Azelaic Acid (FINACEA) 15 % cream Apply 1 application topically 2 (two) times daily as needed. For Roscacea    . COZAAR 50 MG tablet Take 1 tablet (50 mg total) by mouth daily.  90 tablet 3  . COZAAR 50 MG tablet Take 1 tablet (50 mg total) by mouth daily. (Patient taking differently: Take 25 mg by mouth daily. ) 90 tablet 3  . guaiFENesin (MUCINEX) 600 MG 12 hr tablet Take by mouth as needed.     . levETIRAcetam (KEPPRA) 500 MG tablet TAKE ONE-HALF TABLET IN THE MORNING AND TAKE ONE TABLET IN THE EVENING 135 tablet 0  . SALINE NASAL SPRAY NA Place 1 spray into the nose 2 (two) times daily.     No facility-administered medications prior to visit.     PAST MEDICAL HISTORY: Past Medical History:  Diagnosis Date  . 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 ALLERGIC RHINITIS 02/23/2009  . COLONIC POLYPS, HX OF 02/23/2009  . Convulsions/seizures ([place]) 10/24/2014  . DIVERTICULITIS, HX OF 02/23/2009  . Ear infection   . Family history of heart disease   . GERD 02/23/2009  . Hyperlipidemia   . HYPERTENSION 02/23/2009  . PONV (postoperative nausea and vomiting)   . Pre-syncope   . Thoracic aortic aneurysm ([place])   . URINARY INCONTINENCE 02/23/2009    PAST SURGICAL HISTORY: Past Surgical History:  Procedure Laterality Date  . APPENDECTOMY  1946  . CATARACT EXTRACTION Bilateral   . Left ear tube  2001  . Removal of pre-cancer bump on neck  2005  . Ruptured appendics  1946  .  Sinus surgery  2008    FAMILY HISTORY: Family History  Problem Relation Age of Onset  . Heart disease Mother   . Stroke Father   . Heart disease Father   . Cancer Sister   . Cancer Brother   . Heart disease Other   . Stroke Other     SOCIAL HISTORY: Social History   Socioeconomic History  . Marital status: Married    Spouse name: Not on file  . Number of children: 2  . Years of education: 34  . Highest education level: Not on file  Occupational History  . Occupation: retired  Scientific laboratory technician  . Financial resource strain: Not on file  . Food insecurity    Worry: Not on file    Inability: Not on file  . Transportation needs    Medical: Not on file    Non-medical: Not on file  Tobacco  Use  . Smoking status: Former Research scientist (life sciences)  . Smokeless tobacco: Never Used  . Tobacco comment: Lives with spouse-independant in all ADLs. [employer] and [employer] at [employer], Married in "52"  Substance and Sexual Activity  . Alcohol use: No  . Drug use: No  . Sexual activity: Yes  Lifestyle  . Physical activity    Days per week: Not on file    Minutes per session: Not on file  . Stress: Not on file  Relationships  . Social Herbalist on phone: Not on file    Gets together: Not on file    Attends religious service: Not on file    Active member of club or organization: Not on file    Attends meetings of clubs or organizations: Not on file    Relationship status: Not on file  . Intimate partner violence    Fear of current or ex partner: Not on file    Emotionally abused: Not on file    Physically abused: Not on file    Forced sexual activity: Not on file  Other Topics Concern  . Not on file  Social History Narrative   Patient occasionally drinks tea.   Patient is right handed.      PHYSICAL EXAM  Vitals:   03/04/19 1308  BP: 118/70  Pulse: 64  Temp: (!) 97.5 F (36.4 C)  Weight: 158 lb 12.8 oz (72 kg)  Height: 5\' 8"  (1.727 m)   Body mass index is 24.15 kg/m.   MMSE - Mini Mental State Exam 03/04/2019  Orientation to time 5  Orientation to Place 5  Registration 3  Attention/ Calculation 5  Recall 3  Language- name 2 objects 2  Language- repeat 1  Language- follow 3 step command 3  Language- read & follow direction 1  Write a sentence 1  Copy design 1  Copy design-comments named 10 animals.  Total score 30    Generalized: Well developed, in no acute distress   Neurological examination  Mentation: Alert oriented to time, place, history taking. Follows all commands speech and language fluent Cranial nerve II-XII: Pupils were equal round reactive to light. Extraocular movements were full, visual field were full on confrontational  test.. Head turning and shoulder shrug  were normal and symmetric. Motor: The motor testing reveals 5 over 5 strength of all 4 extremities. Good symmetric motor tone is noted throughout.  Sensory: Sensory testing is intact to soft touch on all 4 extremities. No evidence of extinction is noted.  Coordination: Cerebellar testing reveals good finger-nose-finger and  heel-to-shin bilaterally.  Gait and station: Gait is normal.  Reflexes: Deep tendon reflexes are symmetric and normal bilaterally.   DIAGNOSTIC DATA (LABS, IMAGING, TESTING) - I reviewed patient records, labs, notes, testing and imaging myself where available.  Lab Results  Component Value Date   WBC 5.5 03/22/2016   HGB 14.3 03/22/2016   HCT 41.9 03/22/2016   MCV 96.5 03/22/2016   PLT 160 03/22/2016      Component Value Date/Time   NA 134 (L) 03/22/2016 1128   K 4.7 03/22/2016 1128   CL 101 03/22/2016 1128   CO2 27 03/22/2016 1128   GLUCOSE 80 03/22/2016 1128   BUN 17 03/22/2016 1128   CREATININE 1.27 (H) 03/22/2016 1128   CALCIUM 8.7 03/22/2016 1128   PROT 6.6 03/22/2016 1128   ALBUMIN 3.9 03/22/2016 1128   AST 22 03/22/2016 1128   ALT 15 03/22/2016 1128   ALKPHOS 73 03/22/2016 1128   BILITOT 1.0 03/22/2016 1128   GFRNONAA 51 (L) 03/22/2016 1128   GFRAA 59 (L) 03/22/2016 1128   Lab Results  Component Value Date   CHOL 123 03/22/2016   HDL 50 03/22/2016   LDLCALC 63 03/22/2016   TRIG 52 03/22/2016   CHOLHDL 2.5 03/22/2016   No results found for: HGBA1C No results found for: VITAMINB12 Lab Results  Component Value Date   TSH 1.16 03/22/2016      ASSESSMENT AND PLAN 85 y.o. year old male  has a past medical history of ACNE ROSACEA (09/09/2009), ALLERGIC RHINITIS (02/23/2009), COLONIC POLYPS, HX OF (02/23/2009), Convulsions/seizures ([place]) (10/24/2014), DIVERTICULITIS, HX OF (02/23/2009), Ear infection, Family history of heart disease, GERD (02/23/2009), Hyperlipidemia, HYPERTENSION (02/23/2009), PONV  (postoperative nausea and vomiting), Pre-syncope, Thoracic aortic aneurysm ([place]), and URINARY INCONTINENCE (02/23/2009). here with:  1.  Seizures 2. Memory disturbance  Overall the patient is doing well.  He will continue on Keppra 250 mg in the morning and 500 mg in the evening.  Memory score is 30/30 will continue to monitor.  He is advised that if he has any seizure events he should let us know.  He will follow-up in 1 year or sooner if needed  I spent 15 minutes with the patient. 50% of this time was spent discussing plan of care   Ward Givens, MSN, NP-C 03/04/2019, 12:50 PM [facility] [address] [place], [place] [address] [phone]

## 2019-03-04 NOTE — Patient Instructions (Signed)
Your Plan:  Continue Keppra  If your symptoms worsen or you develop new symptoms please let us know.    Thank you for coming to see us at [facility]. I hope we have been able to provide you high quality care today.  You may receive a patient satisfaction survey over the next few weeks. We would appreciate your feedback and comments so that we may continue to improve ourselves and the health of our patients.  

## 2019-04-09 ENCOUNTER — Ambulatory Visit: Payer: Medicare Other | Admitting: Cardiovascular Disease

## 2019-04-16 ENCOUNTER — Ambulatory Visit: Payer: Medicare Other | Admitting: Cardiovascular Disease

## 2019-04-23 ENCOUNTER — Other Ambulatory Visit: Payer: Self-pay | Admitting: Adult Health

## 2019-05-07 ENCOUNTER — Encounter: Payer: Self-pay | Admitting: Cardiovascular Disease

## 2019-05-07 ENCOUNTER — Ambulatory Visit (INDEPENDENT_AMBULATORY_CARE_PROVIDER_SITE_OTHER): Payer: Medicare Other | Admitting: Cardiovascular Disease

## 2019-05-07 ENCOUNTER — Other Ambulatory Visit: Payer: Self-pay

## 2019-05-07 DIAGNOSIS — I712 Thoracic aortic aneurysm, without rupture, unspecified: Secondary | ICD-10-CM

## 2019-05-07 DIAGNOSIS — I10 Essential (primary) hypertension: Secondary | ICD-10-CM

## 2019-05-07 DIAGNOSIS — I4510 Unspecified right bundle-branch block: Secondary | ICD-10-CM | POA: Diagnosis not present

## 2019-05-07 DIAGNOSIS — E782 Mixed hyperlipidemia: Secondary | ICD-10-CM | POA: Diagnosis not present

## 2019-05-07 NOTE — Assessment & Plan Note (Signed)
History of essential hypertension blood pressure measured today 140/80.  He is on Cozaar.

## 2019-05-07 NOTE — Assessment & Plan Note (Signed)
History of thoracic aortic aneurysm by CTA performed 11/18/2015 measuring 4.3 cm.  Given his renal sufficiency will get a 2D echo to further evaluate

## 2019-05-07 NOTE — Patient Instructions (Signed)
Medication Instructions:  Your physician recommends that you continue on your current medications as directed. Please refer to the Current Medication list given to you today.  If you need a refill on your cardiac medications before your next appointment, please call your pharmacy.   Lab work: NONE  Testing/Procedures: Your physician has requested that you have an echocardiogram. Echocardiography is a painless test that uses sound waves to create images of your heart. It provides your doctor with information about the size and shape of your heart and how well your heart's chambers and valves are working. This procedure takes approximately one hour. There are no restrictions for this procedure. [place] [address]   Follow-Up: At [facility], you and your health needs are our priority.  As part of our continuing mission to provide you with exceptional heart care, we have created designated Provider Care Teams.  These Care Teams include your primary Cardiologist (physician) and Advanced Practice Providers (APPs -  Physician Assistants and Nurse Practitioners) who all work together to provide you with the care you need, when you need it. You may see Dr Gwenlyn Found or one of the following Advanced Practice Providers on your designated Care Team:    Kerin Ransom, PA-C  [place], [place]  Coletta Memos, [place]  Your physician wants you to follow-up in: 1 year

## 2019-05-07 NOTE — Assessment & Plan Note (Signed)
Chronic. 

## 2019-05-07 NOTE — Progress Notes (Signed)
05/07/2019 Zachary Padilla   07-Jun-1928  [id number]  Primary Physician Lajean Manes, MD Primary Cardiologist: Lorretta Harp MD Lupe Carney, [place]  HPI:  Zachary Padilla is a 85 y.o.  thinand fit appearing married Caucasian male father of 2, grandfather 4 grandchildren who I saw last  03/28/2018. He has a history of hypertension, mild hyperlipidemia and family history of heart disease as well as chronic right bundle branch block. His mother died of an MI at age 41. A Myoview stress test performed 01/19/12 was entirely normal. He denies chest pain or shortness of breath. A chest CT angiogram was performed 03/22/12 for evaluation of potential lung cancer did incidentally reveal a 4.3 cm descending thoracic aortic aneurysm which he has been asymptomatic fromthis was rechecked April of this year and was unchanged. He denies chest pain or shortness of breath. His most recent CT scan performed7/12/17revealed his ascending aneurysm measuring4.3cm at its maximum dimension,unchanged from prior studies.   Since I saw him a year ago he remained stable.  He does have some ankle edema.  His renal status is followed by Dr. Marval Regal.  His serum creatinine has remained stable in the 1.3 range.  He denies chest pain or shortness of breath.  He is not had his aorta assessed in the last 3 years.   Current Meds  Medication Sig  . Azelaic Acid (FINACEA) 15 % cream Apply 1 application topically 2 (two) times daily as needed. For Roscacea  . COZAAR 50 MG tablet Take 1 tablet (50 mg total) by mouth daily. (Patient taking differently: Take 25 mg by mouth daily. )  . guaiFENesin (MUCINEX) 600 MG 12 hr tablet Take by mouth as needed.   . levETIRAcetam (KEPPRA) 500 MG tablet TAKE ONE-HALF TABLET IN THE MORNING AND TAKE ONE TABLET IN THE EVENING  . montelukast (SINGULAIR) 10 MG tablet Take 10 mg by mouth as needed.   Marland Kitchen SALINE NASAL SPRAY NA Place 1 spray into the nose 2 (two) times daily.     Allergies   Allergen Reactions  . Ace Inhibitors Other (See Comments)    unknown  . Aminoglycosides   . Erythromycin Nausea And Vomiting  . Levofloxacin Other (See Comments)    dehydration  . Penicillins Nausea And Vomiting  . Sulfa Antibiotics   . Ciprofloxacin Rash    hives    Social History   Socioeconomic History  . Marital status: Married    Spouse name: Not on file  . Number of children: 2  . Years of education: 48  . Highest education level: Not on file  Occupational History  . Occupation: retired  Tobacco Use  . Smoking status: Former Research scientist (life sciences)  . Smokeless tobacco: Never Used  . Tobacco comment: Lives with spouse-independant in all ADLs. [employer] and [employer] at [employer], Married in "52"  Substance and Sexual Activity  . Alcohol use: No  . Drug use: No  . Sexual activity: Yes  Other Topics Concern  . Not on file  Social History Narrative   Patient occasionally drinks tea.   Patient is right handed.   Social Determinants of Health   Financial Resource Strain:   . Difficulty of Paying Living Expenses: Not on file  Food Insecurity:   . Worried About Charity fundraiser in the Last Year: Not on file  . Ran Out of Food in the Last Year: Not on file  Transportation Needs:   . Lack of Transportation (Medical): Not on  file  . Lack of Transportation (Non-Medical): Not on file  Physical Activity:   . Days of Exercise per Week: Not on file  . Minutes of Exercise per Session: Not on file  Stress:   . Feeling of Stress : Not on file  Social Connections:   . Frequency of Communication with Friends and Family: Not on file  . Frequency of Social Gatherings with Friends and Family: Not on file  . Attends Religious Services: Not on file  . Active Member of Clubs or Organizations: Not on file  . Attends Archivist Meetings: Not on file  . Marital Status: Not on file  Intimate Partner Violence:   . Fear of Current or Ex-Partner: Not on file  .  Emotionally Abused: Not on file  . Physically Abused: Not on file  . Sexually Abused: Not on file     Review of Systems: General: negative for chills, fever, night sweats or weight changes.  Cardiovascular: negative for chest pain, dyspnea on exertion, edema, orthopnea, palpitations, paroxysmal nocturnal dyspnea or shortness of breath Dermatological: negative for rash Respiratory: negative for cough or wheezing Urologic: negative for hematuria Abdominal: negative for nausea, vomiting, diarrhea, bright red blood per rectum, melena, or hematemesis Neurologic: negative for visual changes, syncope, or dizziness All other systems reviewed and are otherwise negative except as noted above.    Blood pressure (!) 148/80, pulse 61, temperature (!) 97 F (36.1 C), height 5\' 8"  (1.727 m), weight 159 lb 3.2 oz (72.2 kg), SpO2 96 %.  General appearance: alert and no distress Neck: no adenopathy, no carotid bruit, no JVD, supple, symmetrical, trachea midline and thyroid not enlarged, symmetric, no tenderness/mass/nodules Lungs: clear to auscultation bilaterally Heart: regular rate and rhythm, S1, S2 normal, no murmur, click, rub or gallop Extremities: extremities normal, atraumatic, no cyanosis or edema Pulses: 2+ and symmetric Skin: Skin color, texture, turgor normal. No rashes or lesions Neurologic: Alert and oriented X 3, normal strength and tone. Normal symmetric reflexes. Normal coordination and gait  EKG sinus bradycardia 59 with right bundle branch block and inferior Q waves.  I personally reviewed this EKG.  ASSESSMENT AND PLAN:   Essential hypertension History of essential hypertension blood pressure measured today 140/80.  He is on Cozaar.  Hyperlipidemia History of hyperlipidemia not on statin therapy with lipid profile performed 03/22/2016 revealing cholesterol 123, LDL 63.  Thoracic aortic aneurysm History of thoracic aortic aneurysm by CTA performed 11/18/2015 measuring 4.3 cm.   Given his renal sufficiency will get a 2D echo to further evaluate  Right bundle branch block Chronic      Lorretta Harp MD [facility], [facility] 05/07/2019 4:27 PM

## 2019-05-07 NOTE — Assessment & Plan Note (Signed)
History of hyperlipidemia not on statin therapy with lipid profile performed 03/22/2016 revealing cholesterol 123, LDL 63.

## 2019-05-13 ENCOUNTER — Telehealth: Payer: Self-pay

## 2019-05-13 NOTE — Telephone Encounter (Signed)
Pt Cozaar prescription not covered by insurance. Pt needs alternative drug

## 2019-05-13 NOTE — Telephone Encounter (Signed)
Looks like he has been getting brand name.  Not going to be covered when it's been generic for 10+ years.   All the ARB's are now generic, so will need to determine why he wants brand.

## 2019-05-17 ENCOUNTER — Other Ambulatory Visit (HOSPITAL_COMMUNITY): Payer: Medicare Other

## 2019-05-21 ENCOUNTER — Other Ambulatory Visit (HOSPITAL_COMMUNITY): Payer: Medicare Other

## 2019-05-23 ENCOUNTER — Telehealth: Payer: Self-pay | Admitting: Adult Health

## 2019-05-23 NOTE — Telephone Encounter (Signed)
I do not see any reason from a neurological standpoint why he could not get the vaccine.

## 2019-05-23 NOTE — Telephone Encounter (Signed)
Pt is asking for a call from RN to know if Dr Jannifer Franklin believes he is a good candidate for the Covid-19 vaccine, please call

## 2019-05-27 NOTE — Telephone Encounter (Signed)
I called pt and relayed that per MM/NP neuro standpoint she did not see any reason for him to take Covid Vacc.  He verbalized understanding.

## 2019-06-25 ENCOUNTER — Other Ambulatory Visit: Payer: Self-pay

## 2019-06-25 ENCOUNTER — Ambulatory Visit (HOSPITAL_COMMUNITY): Payer: Medicare Other | Attending: Cardiovascular Disease

## 2019-06-25 DIAGNOSIS — I712 Thoracic aortic aneurysm, without rupture, unspecified: Secondary | ICD-10-CM

## 2019-06-26 DIAGNOSIS — I4510 Unspecified right bundle-branch block: Secondary | ICD-10-CM

## 2019-06-26 DIAGNOSIS — E7800 Pure hypercholesterolemia, unspecified: Secondary | ICD-10-CM

## 2019-06-26 DIAGNOSIS — E782 Mixed hyperlipidemia: Secondary | ICD-10-CM

## 2019-06-26 DIAGNOSIS — I712 Thoracic aortic aneurysm, without rupture, unspecified: Secondary | ICD-10-CM

## 2019-06-26 DIAGNOSIS — I10 Essential (primary) hypertension: Secondary | ICD-10-CM

## 2019-07-01 ENCOUNTER — Telehealth: Payer: Self-pay | Admitting: Cardiovascular Disease

## 2019-07-01 NOTE — Telephone Encounter (Signed)
Patient calling for his Echo results.

## 2019-07-02 NOTE — Telephone Encounter (Signed)
Advised pt of results. Verbalized understanding. Put in new orders.

## 2019-07-04 ENCOUNTER — Telehealth: Payer: Self-pay | Admitting: Adult Health

## 2019-07-04 ENCOUNTER — Telehealth: Payer: Self-pay | Admitting: Cardiovascular Disease

## 2019-07-04 NOTE — Telephone Encounter (Signed)
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 Deloria Lair next Wednesday. He wanted me to fax ofv note to [facility] office, did [phone]. Awaiting fax confirnation. Received.

## 2019-07-04 NOTE — Telephone Encounter (Signed)
Zachary Padilla is calling stating he would like his latest report of his aneurism and Echo results sent to Dr. Zadie Rhine. The fax number for the office is [phone].

## 2019-07-04 NOTE — Telephone Encounter (Signed)
Pt called wanting to speak to provider about a procedure that was recommended to him and about his medications. Please advise.

## 2019-07-10 ENCOUNTER — Encounter: Payer: Self-pay | Admitting: *Deleted

## 2019-07-16 ENCOUNTER — Telehealth: Payer: Self-pay | Admitting: Adult Health

## 2019-07-16 NOTE — Telephone Encounter (Signed)
Called patient, no answer, message stated he was not available, call later.

## 2019-07-16 NOTE — Telephone Encounter (Signed)
Pt called stating he would like to discuss his medications with RN. Pt requested an apt with NP but no sooner availability before April 27

## 2019-07-16 NOTE — Telephone Encounter (Signed)
I called pt and he has concerns (multiple) relating to his keprra SE, itching, joint pain, weakness, stomach isses, muscle wekaness.  He will make notes when comes in to talk to him about when in for his regular RV, but wanted to asked now about drug interaction with keppra and versed.  Has eye surgery on 07-24-19 with Dr, Zadie Rhine.  Please advise.  I did refer to Dr. Zadie Rhine to discuss, also pharmacist too.

## 2019-07-17 NOTE — Telephone Encounter (Signed)
Below are the results from your recent visit:    Normal heart pumping function with some stiffening on relaxation. Moderate septal hypertrophy(enlargement of the heart). Moderately dilated aorta measuring 67mm. Repeat in 12 months. Our office will be in contact.   LM2CB

## 2019-07-17 NOTE — Telephone Encounter (Signed)
Ok to take Keppra the day of his surgery in my opinion. They both work on the CNS but while under anesthesia his breathing is monitored. Best to discuss with surgeon.

## 2019-07-17 NOTE — Telephone Encounter (Signed)
Follow Up:     Pt said he received Echo result, but he still have some questions please.

## 2019-07-17 NOTE — Telephone Encounter (Signed)
Returned call to pt and explained ECHO results to pt, all questions answered. He now states that he is having a procedure done next week at [facility]-Dr [facility]. And he was wondering if he was OK to have this procedure done. Informed pt that they should fax over a written request for cardiac clearance and I do not see that this was done. Will call [phone] to discuss. Pt expressed thanks.  Called and left detailed message to fax written request or if she needs to call back to triage.

## 2019-07-17 NOTE — Telephone Encounter (Signed)
Spoke with pt, all questions answered.

## 2019-07-17 NOTE — Telephone Encounter (Signed)
I spoke to pt and relayed that per MM/NP ok to take keppra, both versed and keppra work on CNS.  Best to check with surgeon.  He verbalized understanding.

## 2019-07-18 ENCOUNTER — Telehealth: Payer: Self-pay | Admitting: Cardiovascular Disease

## 2019-07-18 NOTE — Telephone Encounter (Signed)
   [facility] Pre-operative Risk Assessment    Request for surgical clearance:  1. What type of surgery is being performed? Vitrectomy with membrane peel right eye   2. When is this surgery scheduled? 07/24/19   3. What type of clearance is required (medical clearance vs. Pharmacy clearance to hold med vs. Both)? Both   4. Are there any medications that need to be held prior to surgery and how long? No   5. Practice name and name of physician performing surgery? [facility],  Dr. Deloria Lair   6. What is your office phone number [phone]   7.   What is your office fax number [phone]  8.   Anesthesia type (None, local, MAC, general) ? [place] 07/18/2019, 8:30 AM  _________________________________________________________________   (provider comments below)

## 2019-07-18 NOTE — Telephone Encounter (Signed)
   Primary Cardiologist: Quay Burow, MD  Chart reviewed as part of pre-operative protocol coverage. Patient was contacted 07/18/2019 in reference to pre-operative risk assessment for pending surgery as outlined below.  Zachary Padilla was last seen on 05/07/2019 by Dr. Gwenlyn Found.  Since that day, Kysean Witmer has done well. He has no high risk features to preclude him from having eye surgery which is considered a low risk procedure. He is a little conflicted as to whether to go forward with the procedure. We discussed that his age puts him at increased risk for any procedure but cardiac wise he is acceptable to have this procedure. I advised him to speak to his ophthalmologist regarding the risks and benefits of the procedure.  I will route this recommendation to the requesting party via Epic fax function and remove from pre-op pool.  Please call with questions.  Daune Perch, NP 07/18/2019, 9:42 AM

## 2019-07-20 ENCOUNTER — Other Ambulatory Visit: Payer: Self-pay | Admitting: Adult Health

## 2019-07-25 HISTORY — PX: EYE SURGERY: SHX253

## 2019-07-29 ENCOUNTER — Ambulatory Visit: Payer: Medicare Other | Admitting: Dermatology

## 2019-08-20 ENCOUNTER — Encounter: Payer: Self-pay | Admitting: Adult Health

## 2019-08-20 ENCOUNTER — Ambulatory Visit (INDEPENDENT_AMBULATORY_CARE_PROVIDER_SITE_OTHER): Payer: Medicare Other | Admitting: Adult Health

## 2019-08-20 ENCOUNTER — Other Ambulatory Visit: Payer: Self-pay

## 2019-08-20 VITALS — BP 135/75 | HR 67 | Temp 97.9°F | Ht 68.0 in | Wt 159.8 lb

## 2019-08-20 DIAGNOSIS — R569 Unspecified convulsions: Secondary | ICD-10-CM | POA: Diagnosis not present

## 2019-08-20 MED ORDER — LEVETIRACETAM 500 MG PO TABS: ORAL_TABLET | ORAL | 3 refills | Status: DC

## 2019-08-20 NOTE — Progress Notes (Signed)
I have read the note, and I agree with the clinical assessment and plan.  Lakenya Riendeau K Chaz Ronning   

## 2019-08-20 NOTE — Patient Instructions (Signed)
Your Plan:  Continue Keppra  If your symptoms worsen or you develop new symptoms please let us know.    Thank you for coming to see us at [facility]. I hope we have been able to provide you high quality care today.  You may receive a patient satisfaction survey over the next few weeks. We would appreciate your feedback and comments so that we may continue to improve ourselves and the health of our patients.  

## 2019-08-20 NOTE — Progress Notes (Signed)
PATIENT: Zachary Padilla DOB: Aug 21, 1928  REASON FOR VISIT: follow up HISTORY FROM: patient  HISTORY OF PRESENT ILLNESS: Today 08/20/19:  Zachary Padilla is an 86 year old male with a history of seizures.  He returns today for follow-up.  He denies any seizure events.  Continues on Keppra 250 mg in the morning and 500 mg in the evening.  He denies any further changes with his memory.  He is able to complete all ADLs independently.  He operates a Teacher, music.  He returns today for an evaluation.  HISTORY Zachary Padilla is an 86 year old male with a history of seizures.  He returns today for follow-up.  He continues to do well.  He denies any seizure events.  He remains on Keppra taking 250 mg in the morning and 500 mg in the evening.  No change in his mood or behavior.  He does report that he is noticed some changes with his memory.  He reports that he may not remember people's names that he should be familiar with.  He states that typically he can recall it later.  He is able to complete all ADLs independently.  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  He returns today for an evaluation.  REVIEW OF SYSTEMS: Out of a complete 14 system review of symptoms, the patient complains only of the following symptoms, and all other reviewed systems are negative.   See HPI  ALLERGIES: Allergies  Allergen Reactions  . Ace Inhibitors Other (See Comments)    unknown  . Aminoglycosides   . Erythromycin Nausea And Vomiting  . Levofloxacin Other (See Comments)    dehydration  . Penicillins Nausea And Vomiting  . Sulfa Antibiotics   . Ciprofloxacin Rash    hives    HOME MEDICATIONS: Outpatient Medications Prior to Visit  Medication Sig Dispense Refill  . Azelaic Acid (FINACEA) 15 % cream Apply 1 application topically 2 (two) times daily as needed. For Roscacea    . COZAAR 25 MG tablet 1 capsule as directed as directed    .  Dextromethorphan-guaiFENesin (MUCINEX DM) 30-600 MG TB12 Take by mouth as directed.    Marland Kitchen guaiFENesin (MUCINEX) 600 MG 12 hr tablet Take by mouth as needed.     . levETIRAcetam (KEPPRA) 500 MG tablet TAKE ONE-HALF TABLET IN THE MORNING AND TAKE ONE TABLET IN THE EVENING 135 tablet 0  . montelukast (SINGULAIR) 10 MG tablet Take 10 mg by mouth as needed.     Marland Kitchen PRED FORTE 1 % ophthalmic suspension Place 1 drop into the right eye 4 (four) times daily.    Marland Kitchen SALINE NASAL SPRAY NA Place 1 spray into the nose 2 (two) times daily.    Marland Kitchen tobramycin (TOBREX) 0.3 % ophthalmic solution Place 1 drop into the right eye 4 (four) times daily.    Marland Kitchen COZAAR 50 MG tablet Take 1 tablet (50 mg total) by mouth daily. (Patient not taking: Reported on 08/20/2019) 90 tablet 3   No facility-administered medications prior to visit.    PAST MEDICAL HISTORY: Past Medical History:  Diagnosis Date  . 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 ALLERGIC RHINITIS 02/23/2009  . Basal cell carcinoma 06/23/2010   R cheek- (CX35FU+ exc )  . COLONIC POLYPS, HX OF 02/23/2009  . Convulsions/seizures ([place]) 10/24/2014  . DIVERTICULITIS, HX OF 02/23/2009  . Ear infection   . Family history of heart disease   . GERD 02/23/2009  . Hyperlipidemia   .  HYPERTENSION 02/23/2009  . PONV (postoperative nausea and vomiting)   . Pre-syncope   . Thoracic aortic aneurysm ([place])   . URINARY INCONTINENCE 02/23/2009    PAST SURGICAL HISTORY: Past Surgical History:  Procedure Laterality Date  . APPENDECTOMY  1946  . CATARACT EXTRACTION Bilateral   . EYE SURGERY Right 07/25/2019  . Left ear tube  2001  . Removal of pre-cancer bump on neck  2005  . Ruptured appendics  1946  . Sinus surgery  2008    FAMILY HISTORY: Family History  Problem Relation Age of Onset  . Heart disease Mother   . Stroke Father   . Heart disease Father   . Cancer Sister   . Cancer Brother   . Heart disease Other   . Stroke Other     SOCIAL HISTORY: Social History    Socioeconomic History  . Marital status: Married    Spouse name: Not on file  . Number of children: 2  . Years of education: 69  . Highest education level: Not on file  Occupational History  . Occupation: retired  Tobacco Use  . Smoking status: Former Research scientist (life sciences)  . Smokeless tobacco: Never Used  . Tobacco comment: Lives with spouse-independant in all ADLs. [employer] and [employer] at [employer], Married in "52"  Substance and Sexual Activity  . Alcohol use: No  . Drug use: No  . Sexual activity: Yes  Other Topics Concern  . Not on file  Social History Narrative   Patient occasionally drinks tea.   Patient is right handed.   Social Determinants of Health   Financial Resource Strain:   . Difficulty of Paying Living Expenses:   Food Insecurity:   . Worried About Charity fundraiser in the Last Year:   . Arboriculturist in the Last Year:   Transportation Needs:   . Film/video editor (Medical):   Marland Kitchen Lack of Transportation (Non-Medical):   Physical Activity:   . Days of Exercise per Week:   . Minutes of Exercise per Session:   Stress:   . Feeling of Stress :   Social Connections:   . Frequency of Communication with Friends and Family:   . Frequency of Social Gatherings with Friends and Family:   . Attends Religious Services:   . Active Member of Clubs or Organizations:   . Attends Archivist Meetings:   Marland Kitchen Marital Status:   Intimate Partner Violence:   . Fear of Current or Ex-Partner:   . Emotionally Abused:   Marland Kitchen Physically Abused:   . Sexually Abused:       PHYSICAL EXAM  Vitals:   08/20/19 1421  BP: 135/75  Pulse: 67  Temp: 97.9 F (36.6 C)  Weight: 159 lb 12.8 oz (72.5 kg)  Height: 5\' 8"  (1.727 m)   Body mass index is 24.3 kg/m.  Generalized: Well developed, in no acute distress   Neurological examination  Mentation: Alert oriented to time, place, history taking. Follows all commands speech and language fluent Cranial  nerve II-XII: Pupils were equal round reactive to light. Extraocular movements were full, visual field were full on confrontational test.  Head turning and shoulder shrug  were normal and symmetric. Motor: The motor testing reveals 5 over 5 strength of all 4 extremities. Good symmetric motor tone is noted throughout.  Sensory: Sensory testing is intact to soft touch on all 4 extremities. No evidence of extinction is noted.  Coordination: Cerebellar testing reveals good finger-nose-finger  and heel-to-shin bilaterally.  Gait and station: Gait is normal.  Reflexes: Deep tendon reflexes are symmetric and normal bilaterally.   DIAGNOSTIC DATA (LABS, IMAGING, TESTING) - I reviewed patient records, labs, notes, testing and imaging myself where available.  Lab Results  Component Value Date   WBC 5.5 03/22/2016   HGB 14.3 03/22/2016   HCT 41.9 03/22/2016   MCV 96.5 03/22/2016   PLT 160 03/22/2016      Component Value Date/Time   NA 134 (L) 03/22/2016 1128   K 4.7 03/22/2016 1128   CL 101 03/22/2016 1128   CO2 27 03/22/2016 1128   GLUCOSE 80 03/22/2016 1128   BUN 17 03/22/2016 1128   CREATININE 1.27 (H) 03/22/2016 1128   CALCIUM 8.7 03/22/2016 1128   PROT 6.6 03/22/2016 1128   ALBUMIN 3.9 03/22/2016 1128   AST 22 03/22/2016 1128   ALT 15 03/22/2016 1128   ALKPHOS 73 03/22/2016 1128   BILITOT 1.0 03/22/2016 1128   GFRNONAA 51 (L) 03/22/2016 1128   GFRAA 59 (L) 03/22/2016 1128   Lab Results  Component Value Date   CHOL 123 03/22/2016   HDL 50 03/22/2016   LDLCALC 63 03/22/2016   TRIG 52 03/22/2016   CHOLHDL 2.5 03/22/2016    Lab Results  Component Value Date   TSH 1.16 03/22/2016      ASSESSMENT AND PLAN 86 y.o. year old male  has a past medical history of ACNE ROSACEA (09/09/2009), ALLERGIC RHINITIS (02/23/2009), Basal cell carcinoma (06/23/2010), COLONIC POLYPS, HX OF (02/23/2009), Convulsions/seizures ([place]) (10/24/2014), DIVERTICULITIS, HX OF (02/23/2009), Ear infection,  Family history of heart disease, GERD (02/23/2009), Hyperlipidemia, HYPERTENSION (02/23/2009), PONV (postoperative nausea and vomiting), Pre-syncope, Thoracic aortic aneurysm ([place]), and URINARY INCONTINENCE (02/23/2009). here with:  1.  Seizures  -Continue Keppra 250 mg in the morning and 500 mg in the evening -Advised if symptoms worsen or he has any seizure events he should let us know -Follow-up in 6 months or sooner if needed     I spent 25 minutes of face-to-face and non-face-to-face time with patient.  This included previsit chart review, lab review, study review, order entry, electronic health record documentation, patient education.  Ward Givens, MSN, NP-C 08/20/2019, 2:40 PM [facility] [address] [place], [place] [address] [phone]

## 2019-08-26 ENCOUNTER — Other Ambulatory Visit: Payer: Self-pay

## 2019-08-26 ENCOUNTER — Ambulatory Visit (INDEPENDENT_AMBULATORY_CARE_PROVIDER_SITE_OTHER): Payer: Medicare Other | Admitting: Ophthalmology

## 2019-08-26 ENCOUNTER — Encounter (INDEPENDENT_AMBULATORY_CARE_PROVIDER_SITE_OTHER): Payer: Self-pay | Admitting: Ophthalmology

## 2019-08-26 DIAGNOSIS — H16201 Unspecified keratoconjunctivitis, right eye: Secondary | ICD-10-CM

## 2019-08-26 DIAGNOSIS — H43811 Vitreous degeneration, right eye: Secondary | ICD-10-CM | POA: Insufficient documentation

## 2019-08-26 DIAGNOSIS — H35071 Retinal telangiectasis, right eye: Secondary | ICD-10-CM | POA: Insufficient documentation

## 2019-08-26 DIAGNOSIS — H35371 Puckering of macula, right eye: Secondary | ICD-10-CM | POA: Insufficient documentation

## 2019-08-26 HISTORY — DX: Vitreous degeneration, right eye: H43.811

## 2019-08-26 MED ORDER — LOTEPREDNOL ETABONATE 0.5 % OP SUSP: 1.0000 [drp] | Freq: Three times a day (TID) | OPHTHALMIC | 11 refills | Status: DC

## 2019-08-26 NOTE — Progress Notes (Signed)
08/26/2019     CHIEF COMPLAINT Patient presents for Retina Follow Up   HISTORY OF PRESENT ILLNESS: Zachary Padilla is a 86 y.o. male who presents to the clinic today for:   HPI    Retina Follow Up    Patient presents with  Other.  In right eye.  Severity is mild.  Since onset it is stable.          Comments    S/p Vitrectomy OD 07/24/19 Patient states that over the weekend, his eye began to itch and weep. He states it feels like there is a piece of gravel stuck in his eye. Patient states that he stopped the drops last Tuesday and states that he started to have the problems then.       Last edited by Gerda Diss on 08/26/2019  2:22 PM. (History)      Referring physician: Lajean Manes, MD [address],  [place] [address]  HISTORICAL INFORMATION:   Selected notes from the MEDICAL RECORD NUMBER       CURRENT MEDICATIONS: Current Outpatient Medications (Ophthalmic Drugs)  Medication Sig  . PRED FORTE 1 % ophthalmic suspension Place 1 drop into the right eye 4 (four) times daily.  Marland Kitchen tobramycin (TOBREX) 0.3 % ophthalmic solution Place 1 drop into the right eye 4 (four) times daily.   No current facility-administered medications for this visit. (Ophthalmic Drugs)   Current Outpatient Medications (Other)  Medication Sig  . Azelaic Acid (FINACEA) 15 % cream Apply 1 application topically 2 (two) times daily as needed. For Roscacea  . COZAAR 25 MG tablet 1 capsule as directed as directed  . Dextromethorphan-guaiFENesin (MUCINEX DM) 30-600 MG TB12 Take by mouth as directed.  Marland Kitchen guaiFENesin (MUCINEX) 600 MG 12 hr tablet Take by mouth as needed.   . levETIRAcetam (KEPPRA) 500 MG tablet TAKE ONE-HALF TABLET IN THE MORNING AND TAKE ONE TABLET IN THE EVENING  . montelukast (SINGULAIR) 10 MG tablet Take 10 mg by mouth as needed.   Marland Kitchen SALINE NASAL SPRAY NA Place 1 spray into the nose 2 (two) times daily.   No current facility-administered medications for this  visit. (Other)      REVIEW OF SYSTEMS:    ALLERGIES Allergies  Allergen Reactions  . Ace Inhibitors Other (See Comments)    unknown  . Aminoglycosides   . Erythromycin Nausea And Vomiting  . Levofloxacin Other (See Comments)    dehydration  . Penicillins Nausea And Vomiting  . Sulfa Antibiotics   . Ciprofloxacin Rash    hives    PAST MEDICAL HISTORY Past Medical History:  Diagnosis Date  . 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 ALLERGIC RHINITIS 02/23/2009  . Basal cell carcinoma 06/23/2010   R cheek- (CX35FU+ exc )  . COLONIC POLYPS, HX OF 02/23/2009  . Convulsions/seizures ([place]) 10/24/2014  . DIVERTICULITIS, HX OF 02/23/2009  . Ear infection   . Family history of heart disease   . GERD 02/23/2009  . Hyperlipidemia   . HYPERTENSION 02/23/2009  . PONV (postoperative nausea and vomiting)   . Pre-syncope   . Thoracic aortic aneurysm ([place])   . URINARY INCONTINENCE 02/23/2009   Past Surgical History:  Procedure Laterality Date  . APPENDECTOMY  1946  . CATARACT EXTRACTION Bilateral   . EYE SURGERY Right 07/25/2019  . Left ear tube  2001  . Removal of pre-cancer bump on neck  2005  . Ruptured appendics  1946  . Sinus surgery  2008  FAMILY HISTORY Family History  Problem Relation Age of Onset  . Heart disease Mother   . Stroke Father   . Heart disease Father   . Cancer Sister   . Cancer Brother   . Heart disease Other   . Stroke Other     SOCIAL HISTORY Social History   Tobacco Use  . Smoking status: Former Research scientist (life sciences)  . Smokeless tobacco: Never Used  . Tobacco comment: Lives with spouse-independant in all ADLs. [employer] and [employer] at [employer], Married in "52"  Substance Use Topics  . Alcohol use: No  . Drug use: No         OPHTHALMIC EXAM: Base Eye Exam    Visual Acuity (Snellen - Linear)      Right Left   Dist [place] 20/200+1 20/30+1   Dist ph [place] NI 20/25       Tonometry (Tonopen, 2:26 PM)      Right Left   Pressure 9 13         Slit Lamp and Fundus Exam    External Exam      Right Left   External Normal Normal       Slit Lamp Exam      Right Left   Lids/Lashes Normal Normal   Conjunctiva/Sclera [place] and quiet [place] and quiet   Cornea Clear Clear   Anterior Chamber Deep and quiet Deep and quiet   Iris Round and reactive Round and reactive   Lens Posterior chamber intraocular lens Posterior chamber intraocular lens   Vitreous Normal Normal          IMAGING AND PROCEDURES  Imaging and Procedures for 08/26/19           ASSESSMENT/PLAN:  No problem-specific Assessment & Plan notes found for this encounter.      ICD-10-CM   1. Right epiretinal membrane  H35.371   2. Posterior vitreous detachment of right eye  H43.811   3. 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  2.  3.  Ophthalmic Meds Ordered this visit:  No orders of the defined types were placed in this encounter.      No follow-ups on file.  There are no Patient Instructions on file for this visit.   Explained the diagnoses, plan, and follow up with the patient and they expressed understanding.  Patient expressed understanding of the importance of proper follow up care.   Clent Demark Cherylann Hobday M.D. Diseases & Surgery of the Retina and Vitreous Retina & Diabetic [place] 08/26/19     Abbreviations: M myopia (nearsighted); A astigmatism; H hyperopia (farsighted); P presbyopia; Mrx spectacle prescription;  CTL contact lenses; OD right eye; OS left eye; OU both eyes  XT exotropia; ET esotropia; PEK punctate epithelial keratitis; PEE punctate epithelial erosions; DES dry eye syndrome; MGD meibomian gland dysfunction; ATs artificial tears; PFAT's preservative free artificial tears; [place] nuclear sclerotic cataract; PSC posterior subcapsular cataract; ERM epi-retinal membrane; PVD posterior vitreous detachment; RD retinal detachment; DM diabetes mellitus; DR diabetic retinopathy; NPDR non-proliferative diabetic  retinopathy; PDR proliferative diabetic retinopathy; CSME clinically significant macular edema; DME diabetic macular edema; dbh dot blot hemorrhages; CWS cotton wool spot; POAG primary open angle glaucoma; C/D cup-to-disc ratio; HVF humphrey visual field; GVF goldmann visual field; OCT optical coherence tomography; IOP intraocular pressure; BRVO Branch retinal vein occlusion; CRVO central retinal vein occlusion; CRAO central retinal artery occlusion; BRAO branch retinal artery occlusion; RT retinal tear; SB scleral buckle; PPV pars  plana vitrectomy; VH Vitreous hemorrhage; PRP panretinal laser photocoagulation; IVK intravitreal kenalog; VMT vitreomacular traction; MH Macular hole;  NVD neovascularization of the disc; NVE neovascularization elsewhere; AREDS age related eye disease study; ARMD age related macular degeneration; POAG primary open angle glaucoma; EBMD epithelial/anterior basement membrane dystrophy; ACIOL anterior chamber intraocular lens; IOL intraocular lens; PCIOL posterior chamber intraocular lens; Phaco/IOL phacoemulsification with intraocular lens placement; [place] photorefractive keratectomy; LASIK laser assisted in situ keratomileusis; HTN hypertension; DM diabetes mellitus; COPD chronic obstructive pulmonary disease

## 2019-08-26 NOTE — Patient Instructions (Signed)
Patient appears to have a superficial punctate keratitis probably epithelial viral conjunctivitis.  There is no epithelial breakdown there is certainly no evidence of herpetic disease.  Will hold all prior medications.  He has been off those medications for 5 days.  We will also ask him to use low-dose loteprednol topically to the right eye 3 times daily simply for comfort. I will explained to Mr. Kimmey that this viral illness simply has to run its course.  He also has also does have some other upper respiratory symptoms.

## 2019-08-29 ENCOUNTER — Other Ambulatory Visit: Payer: Self-pay

## 2019-08-29 ENCOUNTER — Encounter (INDEPENDENT_AMBULATORY_CARE_PROVIDER_SITE_OTHER): Payer: Self-pay | Admitting: Ophthalmology

## 2019-08-29 ENCOUNTER — Ambulatory Visit (INDEPENDENT_AMBULATORY_CARE_PROVIDER_SITE_OTHER): Payer: Medicare Other | Admitting: Ophthalmology

## 2019-08-29 DIAGNOSIS — H16201 Unspecified keratoconjunctivitis, right eye: Secondary | ICD-10-CM

## 2019-08-29 NOTE — Assessment & Plan Note (Signed)
Superficial punctate keratitis centrally in the right eye has continued to improve, there is less opacity to the epithelium, the epithelium has fewer whirls,,, and no infiltrates.  This certainly accounts for his improved symptoms and likely slightly include improved vision as well.  Patient will continue topical Lotemax daily.

## 2019-08-29 NOTE — Progress Notes (Signed)
08/29/2019     CHIEF COMPLAINT Patient presents for Retina Follow Up   HISTORY OF PRESENT ILLNESS: Zachary Padilla is a 86 y.o. male who presents to the clinic today for:   HPI    Retina Follow Up    Patient presents with  Other.  In right eye.  Duration of 3 days.  Since onset it is gradually improving.          Comments    3 day follow up- No dilation Patient states that he thinks that his eye has improved. Patient states it is not as red and he no longer has the FB sensation.         Last edited by Hurman Horn, MD on 08/29/2019  4:06 PM. (History)      Referring physician: Lajean Manes, [address] [place],  [place] [address]  HISTORICAL INFORMATION:   Selected notes from the MEDICAL RECORD NUMBER       CURRENT MEDICATIONS: Current Outpatient Medications (Ophthalmic Drugs)  Medication Sig  . loteprednol (LOTEMAX) 0.5 % ophthalmic suspension Place 1 drop into the right eye 3 (three) times daily.  Marland Kitchen PRED FORTE 1 % ophthalmic suspension Place 1 drop into the right eye 4 (four) times daily.  Marland Kitchen tobramycin (TOBREX) 0.3 % ophthalmic solution Place 1 drop into the right eye 4 (four) times daily.   No current facility-administered medications for this visit. (Ophthalmic Drugs)   Current Outpatient Medications (Other)  Medication Sig  . Azelaic Acid (FINACEA) 15 % cream Apply 1 application topically 2 (two) times daily as needed. For Roscacea  . COZAAR 25 MG tablet 1 capsule as directed as directed  . Dextromethorphan-guaiFENesin (MUCINEX DM) 30-600 MG TB12 Take by mouth as directed.  Marland Kitchen guaiFENesin (MUCINEX) 600 MG 12 hr tablet Take by mouth as needed.   . levETIRAcetam (KEPPRA) 500 MG tablet TAKE ONE-HALF TABLET IN THE MORNING AND TAKE ONE TABLET IN THE EVENING  . montelukast (SINGULAIR) 10 MG tablet Take 10 mg by mouth as needed.   Marland Kitchen SALINE NASAL SPRAY NA Place 1 spray into the nose 2 (two) times daily.   No current facility-administered medications  for this visit. (Other)      REVIEW OF SYSTEMS:    ALLERGIES Allergies  Allergen Reactions  . Ace Inhibitors Other (See Comments)    unknown  . Aminoglycosides   . Erythromycin Nausea And Vomiting  . Levofloxacin Other (See Comments)    dehydration  . Penicillins Nausea And Vomiting  . Sulfa Antibiotics   . Ciprofloxacin Rash    hives    PAST MEDICAL HISTORY Past Medical History:  Diagnosis Date  . 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 ALLERGIC RHINITIS 02/23/2009  . Basal cell carcinoma 06/23/2010   R cheek- (CX35FU+ exc )  . COLONIC POLYPS, HX OF 02/23/2009  . Convulsions/seizures ([place]) 10/24/2014  . DIVERTICULITIS, HX OF 02/23/2009  . Ear infection   . Family history of heart disease   . GERD 02/23/2009  . Hyperlipidemia   . HYPERTENSION 02/23/2009  . PONV (postoperative nausea and vomiting)   . Pre-syncope   . Thoracic aortic aneurysm ([place])   . URINARY INCONTINENCE 02/23/2009   Past Surgical History:  Procedure Laterality Date  . APPENDECTOMY  1946  . CATARACT EXTRACTION Bilateral   . EYE SURGERY Right 07/25/2019  . Left ear tube  2001  . Removal of pre-cancer bump on neck  2005  . Ruptured appendics  1946  . Sinus  surgery  2008    FAMILY HISTORY Family History  Problem Relation Age of Onset  . Heart disease Mother   . Stroke Father   . Heart disease Father   . Cancer Sister   . Cancer Brother   . Heart disease Other   . Stroke Other     SOCIAL HISTORY Social History   Tobacco Use  . Smoking status: Former Research scientist (life sciences)  . Smokeless tobacco: Never Used  . Tobacco comment: Lives with spouse-independant in all ADLs. [employer] and [employer] at [employer], Married in "52"  Substance Use Topics  . Alcohol use: No  . Drug use: No         OPHTHALMIC EXAM:  Base Eye Exam    Visual Acuity (Snellen - Linear)      Right Left   Dist [place] 20/80+1 20/25-2   Dist ph [place] 20/60-1        Tonometry (Tonopen, 3:00 PM)      Right Left   Pressure  16 18        Slit Lamp and Fundus Exam    External Exam      Right Left   External Normal Normal       Slit Lamp Exam      Right Left   Lids/Lashes Normal Normal   Conjunctiva/Sclera [place] and quiet [place] and quiet   Cornea Much improved, Keratitis, Punctate epithelial keratitis,, centrally,,   No dendrites with fluoro strip,,  , Debris in tear film Clear   Anterior Chamber Deep and quiet Deep and quiet   Iris Round and reactive Round and reactive   Lens Clear Clear   Anterior Vitreous Normal Normal          IMAGING AND PROCEDURES  Imaging and Procedures for 08/29/19           ASSESSMENT/PLAN:  No problem-specific Assessment & Plan notes found for this encounter.      ICD-10-CM   1. Superficial keratitis with conjunctivitis, right  H16.201     1.  2.  3.  Ophthalmic Meds Ordered this visit:  No orders of the defined types were placed in this encounter.      No follow-ups on file.  There are no Patient Instructions on file for this visit.   Explained the diagnoses, plan, and follow up with the patient and they expressed understanding.  Patient expressed understanding of the importance of proper follow up care.   Clent Demark Lasha Echeverria M.D. Diseases & Surgery of the Retina and Vitreous Retina & Diabetic [place] 08/29/19     Abbreviations: M myopia (nearsighted); A astigmatism; H hyperopia (farsighted); P presbyopia; Mrx spectacle prescription;  CTL contact lenses; OD right eye; OS left eye; OU both eyes  XT exotropia; ET esotropia; PEK punctate epithelial keratitis; PEE punctate epithelial erosions; DES dry eye syndrome; MGD meibomian gland dysfunction; ATs artificial tears; PFAT's preservative free artificial tears; [place] nuclear sclerotic cataract; PSC posterior subcapsular cataract; ERM epi-retinal membrane; PVD posterior vitreous detachment; RD retinal detachment; DM diabetes mellitus; DR diabetic retinopathy; NPDR non-proliferative diabetic  retinopathy; PDR proliferative diabetic retinopathy; CSME clinically significant macular edema; DME diabetic macular edema; dbh dot blot hemorrhages; CWS cotton wool spot; POAG primary open angle glaucoma; C/D cup-to-disc ratio; HVF humphrey visual field; GVF goldmann visual field; OCT optical coherence tomography; IOP intraocular pressure; BRVO Branch retinal vein occlusion; CRVO central retinal vein occlusion; CRAO central retinal artery occlusion; BRAO branch retinal artery occlusion; RT retinal tear; SB scleral buckle;  PPV pars plana vitrectomy; VH Vitreous hemorrhage; PRP panretinal laser photocoagulation; IVK intravitreal kenalog; VMT vitreomacular traction; MH Macular hole;  NVD neovascularization of the disc; NVE neovascularization elsewhere; AREDS age related eye disease study; ARMD age related macular degeneration; POAG primary open angle glaucoma; EBMD epithelial/anterior basement membrane dystrophy; ACIOL anterior chamber intraocular lens; IOL intraocular lens; PCIOL posterior chamber intraocular lens; Phaco/IOL phacoemulsification with intraocular lens placement; [place] photorefractive keratectomy; LASIK laser assisted in situ keratomileusis; HTN hypertension; DM diabetes mellitus; COPD chronic obstructive pulmonary disease

## 2019-09-03 ENCOUNTER — Ambulatory Visit: Payer: Medicare Other | Admitting: Adult Health

## 2019-09-04 ENCOUNTER — Encounter (INDEPENDENT_AMBULATORY_CARE_PROVIDER_SITE_OTHER): Payer: Self-pay | Admitting: Ophthalmology

## 2019-09-04 ENCOUNTER — Other Ambulatory Visit: Payer: Self-pay

## 2019-09-04 ENCOUNTER — Ambulatory Visit (INDEPENDENT_AMBULATORY_CARE_PROVIDER_SITE_OTHER): Payer: Medicare Other | Admitting: Ophthalmology

## 2019-09-04 DIAGNOSIS — H16101 Unspecified superficial keratitis, right eye: Secondary | ICD-10-CM

## 2019-09-04 DIAGNOSIS — Z9889 Other specified postprocedural states: Secondary | ICD-10-CM | POA: Insufficient documentation

## 2019-09-04 DIAGNOSIS — H16201 Unspecified keratoconjunctivitis, right eye: Secondary | ICD-10-CM | POA: Diagnosis not present

## 2019-09-04 DIAGNOSIS — H35371 Puckering of macula, right eye: Secondary | ICD-10-CM | POA: Diagnosis not present

## 2019-09-04 DIAGNOSIS — H35071 Retinal telangiectasis, right eye: Secondary | ICD-10-CM

## 2019-09-04 DIAGNOSIS — H43811 Vitreous degeneration, right eye: Secondary | ICD-10-CM

## 2019-09-04 MED ORDER — LOTEPREDNOL ETABONATE 0.5 % OP SUSP: 1.0000 [drp] | Freq: Two times a day (BID) | OPHTHALMIC | 0 refills | Status: AC

## 2019-09-04 NOTE — Progress Notes (Signed)
09/04/2019     CHIEF COMPLAINT Patient presents for Eye Exam   HISTORY OF PRESENT ILLNESS: Zachary Padilla is a 86 y.o. male who presents to the clinic today for:   HPI    Eye Exam    In right eye.  Characterized as blurry vision.  Severity is mild.  I, the attending physician,  performed the HPI with the patient and updated documentation appropriately.          Comments    1 Week f\u OD for Superficial keratitis with conjunctivitis.   Pt states OD vision is not back to normal. Pt states vision is still slightly blurry. C/o OD feeling scratchy. Using gtts in OD as directed.       Last edited by [name] on 09/04/2019 10:57 AM. (History)      Referring physician: Lajean Manes, MD [address] [place],  [place] [address]  HISTORICAL INFORMATION:   Selected notes from the MEDICAL RECORD NUMBER       CURRENT MEDICATIONS: Current Outpatient Medications (Ophthalmic Drugs)  Medication Sig  . loteprednol (LOTEMAX) 0.5 % ophthalmic suspension Place 1 drop into the right eye 2 (two) times daily for 7 days.   No current facility-administered medications for this visit. (Ophthalmic Drugs)   Current Outpatient Medications (Other)  Medication Sig  . Azelaic Acid (FINACEA) 15 % cream Apply 1 application topically 2 (two) times daily as needed. For Roscacea  . COZAAR 25 MG tablet 1 capsule as directed as directed  . Dextromethorphan-guaiFENesin (MUCINEX DM) 30-600 MG TB12 Take by mouth as directed.  Marland Kitchen guaiFENesin (MUCINEX) 600 MG 12 hr tablet Take by mouth as needed.   . levETIRAcetam (KEPPRA) 500 MG tablet TAKE ONE-HALF TABLET IN THE MORNING AND TAKE ONE TABLET IN THE EVENING  . montelukast (SINGULAIR) 10 MG tablet Take 10 mg by mouth as needed.   Marland Kitchen SALINE NASAL SPRAY NA Place 1 spray into the nose 2 (two) times daily.   No current facility-administered medications for this visit. (Other)      REVIEW OF SYSTEMS:    ALLERGIES Allergies  Allergen  Reactions  . Ace Inhibitors Other (See Comments)    unknown  . Aminoglycosides   . Erythromycin Nausea And Vomiting  . Levofloxacin Other (See Comments)    dehydration  . Penicillins Nausea And Vomiting  . Sulfa Antibiotics   . Ciprofloxacin Rash    hives    PAST MEDICAL HISTORY Past Medical History:  Diagnosis Date  . 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 ALLERGIC RHINITIS 02/23/2009  . Basal cell carcinoma 06/23/2010   R cheek- (CX35FU+ exc )  . COLONIC POLYPS, HX OF 02/23/2009  . Convulsions/seizures ([place]) 10/24/2014  . DIVERTICULITIS, HX OF 02/23/2009  . Ear infection   . Family history of heart disease   . GERD 02/23/2009  . Hyperlipidemia   . HYPERTENSION 02/23/2009  . PONV (postoperative nausea and vomiting)   . Pre-syncope   . Thoracic aortic aneurysm ([place])   . URINARY INCONTINENCE 02/23/2009   Past Surgical History:  Procedure Laterality Date  . APPENDECTOMY  1946  . CATARACT EXTRACTION Bilateral   . EYE SURGERY Right 07/25/2019  . Left ear tube  2001  . Removal of pre-cancer bump on neck  2005  . Ruptured appendics  1946  . Sinus surgery  2008    FAMILY HISTORY Family History  Problem Relation Age of Onset  . Heart disease Mother   . Stroke Father   .  Heart disease Father   . Cancer Sister   . Cancer Brother   . Heart disease Other   . Stroke Other     SOCIAL HISTORY Social History   Tobacco Use  . Smoking status: Former Research scientist (life sciences)  . Smokeless tobacco: Never Used  . Tobacco comment: Lives with spouse-independant in all ADLs. [employer] and [employer] at [employer], Married in "52"  Substance Use Topics  . Alcohol use: No  . Drug use: No         OPHTHALMIC EXAM:  Base Eye Exam    Visual Acuity (Snellen - Linear)      Right Left   Dist [place] 20/50 20/25 +   Dist ph [place] 20/40        Tonometry (Tonopen, 10:56 AM)      Right Left   Pressure 21 21       Pupils      Pupils Dark Light Shape React APD   Right PERRL 4 3 Round Brisk  None   Left PERRL 4 3 Round Brisk None       Neuro/Psych    Oriented x3: Yes   Mood/Affect: Normal        Slit Lamp and Fundus Exam    External Exam      Right Left   External Normal Normal       Slit Lamp Exam      Right Left   Lids/Lashes Normal Normal   Conjunctiva/Sclera [place] and quiet [place] and quiet   Cornea  , Debris in tear film completely resolved, epithelium and cornea are now clear Clear   Anterior Chamber Deep and quiet Deep and quiet   Iris Round and reactive Round and reactive   Lens Clear Clear   Anterior Vitreous Normal Normal          IMAGING AND PROCEDURES  Imaging and Procedures for 09/04/19           ASSESSMENT/PLAN:  No problem-specific Assessment & Plan notes found for this encounter.   Superficial keratitis of the right eye, now improved   1.,  Patient is on a ongoing use of Lotemax OD.  He has been using it 3 times daily, and will now taper to twice daily for 7 days followed by once daily for 7 days prior to cessation of the medication.  He should not refill the medication  2.  3.  Ophthalmic Meds Ordered this visit:  Meds ordered this encounter  Medications  . loteprednol (LOTEMAX) 0.5 % ophthalmic suspension    Sig: Place 1 drop into the right eye 2 (two) times daily for 7 days.    Dispense:  0.7 mL    Refill:  0       Return for OCT, OD    Return visit as scheduled May 24 OCT exam OD.  There are no Patient Instructions on file for this visit.   Explained the diagnoses, plan, and follow up with the patient and they expressed understanding.  Patient expressed understanding of the importance of proper follow up care.   Clent Demark Maryori Weide M.D. Diseases & Surgery of the Retina and Vitreous Retina & Diabetic [place] 09/04/19     Abbreviations: M myopia (nearsighted); A astigmatism; H hyperopia (farsighted); P presbyopia; Mrx spectacle prescription;  CTL contact lenses; OD right eye; OS left eye; OU both eyes  XT  exotropia; ET esotropia; PEK punctate epithelial keratitis; PEE punctate epithelial erosions; DES dry eye syndrome; MGD meibomian gland  dysfunction; ATs artificial tears; PFAT's preservative free artificial tears; [place] nuclear sclerotic cataract; PSC posterior subcapsular cataract; ERM epi-retinal membrane; PVD posterior vitreous detachment; RD retinal detachment; DM diabetes mellitus; DR diabetic retinopathy; NPDR non-proliferative diabetic retinopathy; PDR proliferative diabetic retinopathy; CSME clinically significant macular edema; DME diabetic macular edema; dbh dot blot hemorrhages; CWS cotton wool spot; POAG primary open angle glaucoma; C/D cup-to-disc ratio; HVF humphrey visual field; GVF goldmann visual field; OCT optical coherence tomography; IOP intraocular pressure; BRVO Branch retinal vein occlusion; CRVO central retinal vein occlusion; CRAO central retinal artery occlusion; BRAO branch retinal artery occlusion; RT retinal tear; SB scleral buckle; PPV pars plana vitrectomy; VH Vitreous hemorrhage; PRP panretinal laser photocoagulation; IVK intravitreal kenalog; VMT vitreomacular traction; MH Macular hole;  NVD neovascularization of the disc; NVE neovascularization elsewhere; AREDS age related eye disease study; ARMD age related macular degeneration; POAG primary open angle glaucoma; EBMD epithelial/anterior basement membrane dystrophy; ACIOL anterior chamber intraocular lens; IOL intraocular lens; PCIOL posterior chamber intraocular lens; Phaco/IOL phacoemulsification with intraocular lens placement; [place] photorefractive keratectomy; LASIK laser assisted in situ keratomileusis; HTN hypertension; DM diabetes mellitus; COPD chronic obstructive pulmonary disease

## 2019-09-18 ENCOUNTER — Ambulatory Visit: Payer: Medicare Other | Admitting: Dermatology

## 2019-09-26 ENCOUNTER — Ambulatory Visit (INDEPENDENT_AMBULATORY_CARE_PROVIDER_SITE_OTHER): Payer: Medicare Other | Admitting: Ophthalmology

## 2019-09-26 ENCOUNTER — Other Ambulatory Visit: Payer: Self-pay

## 2019-09-26 ENCOUNTER — Encounter (INDEPENDENT_AMBULATORY_CARE_PROVIDER_SITE_OTHER): Payer: Self-pay | Admitting: Ophthalmology

## 2019-09-26 DIAGNOSIS — H35371 Puckering of macula, right eye: Secondary | ICD-10-CM

## 2019-09-26 DIAGNOSIS — Z9889 Other specified postprocedural states: Secondary | ICD-10-CM

## 2019-09-26 NOTE — Progress Notes (Signed)
09/26/2019     CHIEF COMPLAINT Patient presents for Post-op Follow-up   HISTORY OF PRESENT ILLNESS: Zachary Padilla is a 86 y.o. male who presents to the clinic today for:   HPI    Post-op Follow-up    In right eye.  Discomfort includes itching.  Negative for pain.  Vision is improved.  I, the attending physician,  performed the HPI with the patient and updated documentation appropriately.          Comments    9 Week s\p Vitrectomy membrane peel OD. 3 Week f\u Keratitis OD  Pt states OD vision has slightly improved but thinks it is weaker than OS vision. Pt states OD is itchy in the AM. Pt is no longer using gtts. Pt states when he is reading OD vision looks like there is "dust" in it or little dots.       Last edited by Hurman Horn, MD on 09/26/2019 12:17 PM. (History)      Referring physician: Lajean Manes, [address],  [place] [address]  HISTORICAL INFORMATION:   Selected notes from the [facility]: No current outpatient medications on file. (Ophthalmic Drugs)   No current facility-administered medications for this visit. (Ophthalmic Drugs)   Current Outpatient Medications (Other)  Medication Sig  . Azelaic Acid (FINACEA) 15 % cream Apply 1 application topically 2 (two) times daily as needed. For Roscacea  . COZAAR 25 MG tablet 1 capsule as directed as directed  . Dextromethorphan-guaiFENesin (MUCINEX DM) 30-600 MG TB12 Take by mouth as directed.  Marland Kitchen guaiFENesin (MUCINEX) 600 MG 12 hr tablet Take by mouth as needed.   . levETIRAcetam (KEPPRA) 500 MG tablet TAKE ONE-HALF TABLET IN THE MORNING AND TAKE ONE TABLET IN THE EVENING  . montelukast (SINGULAIR) 10 MG tablet Take 10 mg by mouth as needed.   Marland Kitchen SALINE NASAL SPRAY NA Place 1 spray into the nose 2 (two) times daily.   No current facility-administered medications for this visit. (Other)      REVIEW OF SYSTEMS:    ALLERGIES Allergies    Allergen Reactions  . Ace Inhibitors Other (See Comments)    unknown  . Aminoglycosides   . Erythromycin Nausea And Vomiting  . Levofloxacin Other (See Comments)    dehydration  . Penicillins Nausea And Vomiting  . Sulfa Antibiotics   . Ciprofloxacin Rash    hives    PAST MEDICAL HISTORY Past Medical History:  Diagnosis Date  . 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 ALLERGIC RHINITIS 02/23/2009  . Basal cell carcinoma 06/23/2010   R cheek- (CX35FU+ exc )  . COLONIC POLYPS, HX OF 02/23/2009  . Convulsions/seizures ([place]) 10/24/2014  . DIVERTICULITIS, HX OF 02/23/2009  . Ear infection   . Family history of heart disease   . GERD 02/23/2009  . Hyperlipidemia   . HYPERTENSION 02/23/2009  . PONV (postoperative nausea and vomiting)   . Pre-syncope   . Thoracic aortic aneurysm ([place])   . URINARY INCONTINENCE 02/23/2009   Past Surgical History:  Procedure Laterality Date  . APPENDECTOMY  1946  . CATARACT EXTRACTION Bilateral   . EYE SURGERY Right 07/25/2019  . Left ear tube  2001  . Removal of pre-cancer bump on neck  2005  . Ruptured appendics  1946  . Sinus surgery  2008    FAMILY HISTORY Family History  Problem Relation Age of Onset  . Heart disease Mother   .  Stroke Father   . Heart disease Father   . Cancer Sister   . Cancer Brother   . Heart disease Other   . Stroke Other     SOCIAL HISTORY Social History   Tobacco Use  . Smoking status: Former Research scientist (life sciences)  . Smokeless tobacco: Never Used  . Tobacco comment: Lives with spouse-independant in all ADLs. [employer] and [employer] at [employer], Married in "52"  Substance Use Topics  . Alcohol use: No  . Drug use: No         OPHTHALMIC EXAM:  Base Eye Exam    Visual Acuity (Snellen - Linear)      Right Left   Dist cc 20/40 + 20/20 -1   Dist ph cc 20/30        Tonometry (Tonopen, 11:00 AM)      Right Left   Pressure 15 16       Pupils      Pupils Dark Light Shape React APD   Right PERRL 4 3  Round Brisk None   Left PERRL 4 3 Round Brisk None       Visual Fields (Counting fingers)      Left Right    Full Full       Neuro/Psych    Oriented x3: Yes   Mood/Affect: Normal       Dilation    Right eye: 1.0% Mydriacyl, 2.5% Phenylephrine @ 11:00 AM        Slit Lamp and Fundus Exam    External Exam      Right Left   External Normal Normal       Slit Lamp Exam      Right Left   Lids/Lashes Normal Normal   Conjunctiva/Sclera [place] and quiet [place] and quiet   Cornea  , Debris in tear film completely resolved, epithelium and cornea are now clear Clear   Anterior Chamber Deep and quiet Deep and quiet   Iris Round and reactive Round and reactive   Lens Clear Clear   Anterior Vitreous Normal Normal       Fundus Exam      Right Left   Posterior Vitreous Vitrectomized    Disc Normal    C/D Ratio 0.4    Macula No foveal topographic distortion    Vessels Normal    Periphery Normal           IMAGING AND PROCEDURES  Imaging and Procedures for 09/26/19  OCT, Retina - OU - Both Eyes       Right Eye Quality was good. Scan locations included subfoveal. Central Foveal Thickness: 334.   Left Eye Quality was good. Scan locations included subfoveal. Central Foveal Thickness: 272.                 ASSESSMENT/PLAN:  No problem-specific Assessment & Plan notes found for this encounter.      ICD-10-CM   1. Right epiretinal membrane  H35.371 OCT, Retina - OU - Both Eyes    1.  OD, status post vitrectomy for membrane peel with improved acuity and retinal thickening.  Slow healing may be hampered by undiagnosed sleep apnea in this case.  2.OS, with new onset macular changes with microaneurysms seen on infrared but also intraretinal CME temporally in the region of classic macular telangiectasis.  This finding I have associated with undiagnosed or inadequately treated sleep apnea.  The vascular changes can cause this in the macula.  I explained this to the  patient and have urged the patient to seek primary care evaluation and review of systems are positive for sleep apnea upon my review  3.  Follow-up with Dr. Lajean Manes for consideration of formal sleep apnea testing.  Reviewed the with the patient other examples of patients with either undiagnosed or improperly inadequately treated sleep apnea.  Ophthalmic Meds Ordered this visit:  No orders of the defined types were placed in this encounter.      No follow-ups on file.  There are no Patient Instructions on file for this visit.   Explained the diagnoses, plan, and follow up with the patient and they expressed understanding.  Patient expressed understanding of the importance of proper follow up care.   Clent Demark Frandy Basnett M.D. Diseases & Surgery of the Retina and Vitreous Retina & Diabetic [place] 09/26/19     Abbreviations: M myopia (nearsighted); A astigmatism; H hyperopia (farsighted); P presbyopia; Mrx spectacle prescription;  CTL contact lenses; OD right eye; OS left eye; OU both eyes  XT exotropia; ET esotropia; PEK punctate epithelial keratitis; PEE punctate epithelial erosions; DES dry eye syndrome; MGD meibomian gland dysfunction; ATs artificial tears; PFAT's preservative free artificial tears; [place] nuclear sclerotic cataract; PSC posterior subcapsular cataract; ERM epi-retinal membrane; PVD posterior vitreous detachment; RD retinal detachment; DM diabetes mellitus; DR diabetic retinopathy; NPDR non-proliferative diabetic retinopathy; PDR proliferative diabetic retinopathy; CSME clinically significant macular edema; DME diabetic macular edema; dbh dot blot hemorrhages; CWS cotton wool spot; POAG primary open angle glaucoma; C/D cup-to-disc ratio; HVF humphrey visual field; GVF goldmann visual field; OCT optical coherence tomography; IOP intraocular pressure; BRVO Branch retinal vein occlusion; CRVO central retinal vein occlusion; CRAO central retinal artery occlusion; BRAO branch  retinal artery occlusion; RT retinal tear; SB scleral buckle; PPV pars plana vitrectomy; VH Vitreous hemorrhage; PRP panretinal laser photocoagulation; IVK intravitreal kenalog; VMT vitreomacular traction; MH Macular hole;  NVD neovascularization of the disc; NVE neovascularization elsewhere; AREDS age related eye disease study; ARMD age related macular degeneration; POAG primary open angle glaucoma; EBMD epithelial/anterior basement membrane dystrophy; ACIOL anterior chamber intraocular lens; IOL intraocular lens; PCIOL posterior chamber intraocular lens; Phaco/IOL phacoemulsification with intraocular lens placement; [place] photorefractive keratectomy; LASIK laser assisted in situ keratomileusis; HTN hypertension; DM diabetes mellitus; COPD chronic obstructive pulmonary disease

## 2019-09-26 NOTE — Assessment & Plan Note (Signed)
Looks great OD, status post vitrectomy membrane peel.

## 2019-10-16 ENCOUNTER — Ambulatory Visit: Payer: Medicare Other | Admitting: Dermatology

## 2019-11-20 ENCOUNTER — Other Ambulatory Visit: Payer: Self-pay | Admitting: Cardiovascular Disease

## 2019-11-26 ENCOUNTER — Ambulatory Visit: Payer: Medicare Other | Admitting: Dermatology

## 2019-11-26 ENCOUNTER — Encounter (INDEPENDENT_AMBULATORY_CARE_PROVIDER_SITE_OTHER): Payer: Medicare Other | Admitting: Ophthalmology

## 2019-12-02 ENCOUNTER — Encounter (INDEPENDENT_AMBULATORY_CARE_PROVIDER_SITE_OTHER): Payer: Self-pay | Admitting: Ophthalmology

## 2019-12-02 ENCOUNTER — Ambulatory Visit (INDEPENDENT_AMBULATORY_CARE_PROVIDER_SITE_OTHER): Payer: Medicare Other | Admitting: Ophthalmology

## 2019-12-02 ENCOUNTER — Other Ambulatory Visit: Payer: Self-pay

## 2019-12-02 DIAGNOSIS — H35371 Puckering of macula, right eye: Secondary | ICD-10-CM

## 2019-12-02 DIAGNOSIS — H35071 Retinal telangiectasis, right eye: Secondary | ICD-10-CM

## 2019-12-02 DIAGNOSIS — G4733 Obstructive sleep apnea (adult) (pediatric): Secondary | ICD-10-CM | POA: Diagnosis not present

## 2019-12-02 NOTE — Progress Notes (Signed)
12/02/2019     CHIEF COMPLAINT Patient presents for Retina Follow Up   HISTORY OF PRESENT ILLNESS: Zachary Padilla is a 86 y.o. male who presents to the clinic today for:   HPI    Retina Follow Up    Diagnosis: CME.  In left eye.  Duration of 2 months.  Since onset it is stable.  I, the attending physician,  performed the HPI with the patient and updated documentation appropriately.          Comments    2 Month f\u OU. OCT  Pt states no changes in vision. Pt states he has seen a few halos. Pt had sleep testing and he has Mild-Moderate Sleep Apnea.  Patient completed sleep testing and was found to have mild to moderate sleep apnea. As an aside his wife took similar to be tested and found out she has severe sleep apnea  And they were each scheduled to began therapy after orientation on CPAP machinery tomorrow         Last edited by Hurman Horn, MD on 12/02/2019 11:07 AM. (History)      Referring physician: Lajean Manes, [address] [place],  [place] [address]  HISTORICAL INFORMATION:   Selected notes from the [facility]: No current outpatient medications on file. (Ophthalmic Drugs)   No current facility-administered medications for this visit. (Ophthalmic Drugs)   Current Outpatient Medications (Other)  Medication Sig  . Azelaic Acid (FINACEA) 15 % cream Apply 1 application topically 2 (two) times daily as needed. For Roscacea  . COZAAR 25 MG tablet 1 capsule as directed as directed  . Dextromethorphan-guaiFENesin (MUCINEX DM) 30-600 MG TB12 Take by mouth as directed.  Marland Kitchen guaiFENesin (MUCINEX) 600 MG 12 hr tablet Take by mouth as needed.   . levETIRAcetam (KEPPRA) 500 MG tablet TAKE ONE-HALF TABLET IN THE MORNING AND TAKE ONE TABLET IN THE EVENING  . montelukast (SINGULAIR) 10 MG tablet Take 10 mg by mouth as needed.   Marland Kitchen SALINE NASAL SPRAY NA Place 1 spray into the nose 2 (two) times daily.   No current  facility-administered medications for this visit. (Other)      REVIEW OF SYSTEMS:    ALLERGIES Allergies  Allergen Reactions  . Ace Inhibitors Other (See Comments)    unknown  . Aminoglycosides   . Erythromycin Nausea And Vomiting  . Levofloxacin Other (See Comments)    dehydration  . Penicillins Nausea And Vomiting  . Sulfa Antibiotics   . Ciprofloxacin Rash    hives    PAST MEDICAL HISTORY Past Medical History:  Diagnosis Date  . 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 ALLERGIC RHINITIS 02/23/2009  . Basal cell carcinoma 06/23/2010   R cheek- (CX35FU+ exc )  . COLONIC POLYPS, HX OF 02/23/2009  . Convulsions/seizures ([place]) 10/24/2014  . DIVERTICULITIS, HX OF 02/23/2009  . Ear infection   . Family history of heart disease   . GERD 02/23/2009  . Hyperlipidemia   . HYPERTENSION 02/23/2009  . PONV (postoperative nausea and vomiting)   . Pre-syncope   . Thoracic aortic aneurysm ([place])   . URINARY INCONTINENCE 02/23/2009   Past Surgical History:  Procedure Laterality Date  . APPENDECTOMY  1946  . CATARACT EXTRACTION Bilateral   . EYE SURGERY Right 07/25/2019  . Left ear tube  2001  . Removal of pre-cancer bump on neck  2005  . Ruptured appendics  1946  .  Sinus surgery  2008    FAMILY HISTORY Family History  Problem Relation Age of Onset  . Heart disease Mother   . Stroke Father   . Heart disease Father   . Cancer Sister   . Cancer Brother   . Heart disease Other   . Stroke Other     SOCIAL HISTORY Social History   Tobacco Use  . Smoking status: Former Research scientist (life sciences)  . Smokeless tobacco: Never Used  . Tobacco comment: Lives with spouse-independant in all ADLs. [employer] and [employer] at [employer], Married in "52"  Substance Use Topics  . Alcohol use: No  . Drug use: No         OPHTHALMIC EXAM:  Base Eye Exam    Visual Acuity (Snellen - Linear)      Right Left   Dist [place] 20/30 + 20/20       Tonometry (Tonopen, 10:24 AM)      Right Left    Pressure 13 14       Pupils      Dark Light Shape React APD   Right 3 2 Round Slow None   Left 3 2 Round Slow None       Visual Fields (Counting fingers)      Left Right    Full Full       Neuro/Psych    Oriented x3: Yes   Mood/Affect: Normal        Slit Lamp and Fundus Exam    External Exam      Right Left   External Normal Normal       Slit Lamp Exam      Right Left   Lids/Lashes Normal Normal   Conjunctiva/Sclera [place] and quiet [place] and quiet   Cornea  , Debris in tear film completely resolved, epithelium and cornea are now clear Clear   Anterior Chamber Deep and quiet Deep and quiet   Iris Round and reactive Round and reactive   Lens Clear Clear   Anterior Vitreous Normal Normal          IMAGING AND PROCEDURES  Imaging and Procedures for 12/02/19  OCT, Retina - OU - Both Eyes       Right Eye Quality was good. Scan locations included subfoveal. Central Foveal Thickness: 331. Progression has been stable.   Left Eye Quality was good. Scan locations included subfoveal. Central Foveal Thickness: 264. Progression has been stable.   Notes OD, retinal thickening perifoveal region in the setting of possible retinal telangiectasia.  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 Right epiretinal membrane  H35.371   3. Sleep apnea, obstructive  G47.33     1.  2.  3.  Ophthalmic Meds Ordered this visit:  No orders of the defined types were placed in this encounter.      Return in about 6 weeks (around 01/13/2020) for OCT, DILATE OU.  There are no Patient Instructions on file for this visit.   Explained the diagnoses, plan, and follow up with the patient and they  expressed understanding.  Patient expressed understanding of the importance of proper  follow up care.   Clent Demark Zoella Roberti M.D. Diseases & Surgery of the Retina and Vitreous Retina & Diabetic [place] 12/02/19     Abbreviations: M myopia (nearsighted); A astigmatism; H hyperopia (farsighted); P presbyopia; Mrx spectacle prescription;  CTL contact lenses; OD right eye; OS left eye; OU both eyes  XT exotropia; ET esotropia; PEK punctate epithelial keratitis; PEE punctate epithelial erosions; DES dry eye syndrome; MGD meibomian gland dysfunction; ATs artificial tears; PFAT's preservative free artificial tears; [place] nuclear sclerotic cataract; PSC posterior subcapsular cataract; ERM epi-retinal membrane; PVD posterior vitreous detachment; RD retinal detachment; DM diabetes mellitus; DR diabetic retinopathy; NPDR non-proliferative diabetic retinopathy; PDR proliferative diabetic retinopathy; CSME clinically significant macular edema; DME diabetic macular edema; dbh dot blot hemorrhages; CWS cotton wool spot; POAG primary open angle glaucoma; C/D cup-to-disc ratio; HVF humphrey visual field; GVF goldmann visual field; OCT optical coherence tomography; IOP intraocular pressure; BRVO Branch retinal vein occlusion; CRVO central retinal vein occlusion; CRAO central retinal artery occlusion; BRAO branch retinal artery occlusion; RT retinal tear; SB scleral buckle; PPV pars plana vitrectomy; VH Vitreous hemorrhage; PRP panretinal laser photocoagulation; IVK intravitreal kenalog; VMT vitreomacular traction; MH Macular hole;  NVD neovascularization of the disc; NVE neovascularization elsewhere; AREDS age related eye disease study; ARMD age related macular degeneration; POAG primary open angle glaucoma; EBMD epithelial/anterior basement membrane dystrophy; ACIOL anterior chamber intraocular lens; IOL intraocular lens; PCIOL posterior chamber intraocular lens; Phaco/IOL phacoemulsification with intraocular lens placement;  [place] photorefractive keratectomy; LASIK laser assisted in situ keratomileusis; HTN hypertension; DM diabetes mellitus; COPD chronic obstructive pulmonary disease

## 2019-12-02 NOTE — Assessment & Plan Note (Signed)
Resolved secondary to vitrectomy and ILM peel (membrane peel)

## 2019-12-03 ENCOUNTER — Other Ambulatory Visit: Payer: Self-pay | Admitting: Cardiovascular Disease

## 2019-12-03 NOTE — Telephone Encounter (Signed)
*  STAT* If patient is at the pharmacy, call can be transferred to refill team.   1. Which medications need to be refilled? (please list name of each medication and dose if known)? COZAAR 25 MG tablet  2. Which pharmacy/location (including street and city if local pharmacy) is medication to be sent to? [facility] [place], [place] - [address]  3. Do they need a 30 day or 90 day supply? 30 day

## 2019-12-04 MED ORDER — COZAAR 25 MG PO TABS: 12.5000 mg | ORAL_TABLET | Freq: Every day | ORAL | 1 refills | Status: DC

## 2019-12-04 NOTE — Telephone Encounter (Signed)
Rx(s) sent to pharmacy electronically.  

## 2019-12-09 ENCOUNTER — Other Ambulatory Visit: Payer: Self-pay

## 2019-12-09 ENCOUNTER — Telehealth: Payer: Self-pay | Admitting: Cardiovascular Disease

## 2019-12-09 MED ORDER — LOSARTAN POTASSIUM 50 MG PO TABS: 25.0000 mg | ORAL_TABLET | Freq: Every day | ORAL | 3 refills | Status: DC

## 2019-12-09 NOTE — Telephone Encounter (Signed)
I don't see any indication of reducing dose to 12.5 mg in chart.  Have patient continue with 25 mg daily.  Can do either whole tablet of 25 mg or 1/2 of 50 mg, whichever is his preference.

## 2019-12-09 NOTE — Telephone Encounter (Signed)
New Message:     Please call,concerning the milligram of his Coreg.

## 2019-12-09 NOTE — Telephone Encounter (Signed)
Called and spoke with pt notified that per our Pharmacist Tommy Medal- Rockne Menghini, RPH-CPP      I don't see any indication of reducing dose to 12.5 mg in chart.  Have patient continue with 25 mg daily.  Can do either whole tablet of 25 mg or 1/2 of 50 mg, whichever is his preference.       Pt verbalized understanding and would like the 50mg  prescription to be sent in and he will just cut the pills in half. No other questions from pt at this time.

## 2019-12-09 NOTE — Progress Notes (Signed)
Order for cozaar reordered as brand name prescription.

## 2019-12-09 NOTE — Telephone Encounter (Signed)
Called and spoke with pt, he reports that his Cozaar was sent in as 25mg  and dosage was to take 1/2 (12.5mg ). he reports that he was on 50mg  and was only taking half daily. He reports that his kidney doctor thought that the cozaar was effecting his kidneys and this is why he was only taking half of the 50mg  tab.  Notified that I did not see anywhere in Dr.Berry's note where his medication was changed to a 25mg  tablet with directions to only take half. Notified I would send a message Dr.Berry and our pharmacists for clarification.  He reports he is doing well and was doing well just cutting the 50mg  in half. He reports it needs to be brand-name medication sent in. No other questions at this time Will send message to MD and pharmD at this time.

## 2019-12-12 ENCOUNTER — Other Ambulatory Visit: Payer: Self-pay

## 2019-12-12 MED ORDER — COZAAR 50 MG PO TABS: 25.0000 mg | ORAL_TABLET | Freq: Every day | ORAL | 3 refills | Status: DC

## 2020-01-14 ENCOUNTER — Other Ambulatory Visit: Payer: Self-pay

## 2020-01-14 ENCOUNTER — Encounter (INDEPENDENT_AMBULATORY_CARE_PROVIDER_SITE_OTHER): Payer: Self-pay | Admitting: Ophthalmology

## 2020-01-14 ENCOUNTER — Ambulatory Visit (INDEPENDENT_AMBULATORY_CARE_PROVIDER_SITE_OTHER): Payer: Medicare Other | Admitting: Ophthalmology

## 2020-01-14 DIAGNOSIS — H35071 Retinal telangiectasis, right eye: Secondary | ICD-10-CM | POA: Diagnosis not present

## 2020-01-14 DIAGNOSIS — H35072 Retinal telangiectasis, left eye: Secondary | ICD-10-CM | POA: Diagnosis not present

## 2020-01-14 NOTE — Progress Notes (Signed)
01/14/2020     CHIEF COMPLAINT Patient presents for Retina Follow Up   HISTORY OF PRESENT ILLNESS: Zachary Padilla is a 86 y.o. male who presents to the clinic today for:   HPI    Retina Follow Up    Diagnosis: Mac Tel.  In right eye.  Severity is moderate.  Duration of 6 weeks.  Since onset it is stable.  I, the attending physician,  performed the HPI with the patient and updated documentation appropriately.          Comments    6 Week f\u OU. OCT  Pt states no changes in vision. Pt states he has seen occasional haloes in vision. Pt would like to know when he can update gls rx  Patient has been on CPAP usage now for approximately 6 weeks with excellent compliance. Patient reports more energy, patient also reports a decline in nocturia from 5-6 down to once or twice nightly.       Last edited by Hurman Horn, MD on 01/14/2020 10:49 AM. (History)      Referring physician: Lajean Manes, [place]. [place],  [place] [address]  HISTORICAL INFORMATION:   Selected notes from the [facility]: No current outpatient medications on file. (Ophthalmic Drugs)   No current facility-administered medications for this visit. (Ophthalmic Drugs)   Current Outpatient Medications (Other)  Medication Sig  . Azelaic Acid (FINACEA) 15 % cream Apply 1 application topically 2 (two) times daily as needed. For Roscacea  . COZAAR 50 MG tablet Take 0.5 tablets (25 mg total) by mouth daily.  Marland Kitchen Dextromethorphan-guaiFENesin (MUCINEX DM) 30-600 MG TB12 Take by mouth as directed.  Marland Kitchen guaiFENesin (MUCINEX) 600 MG 12 hr tablet Take by mouth as needed.   . levETIRAcetam (KEPPRA) 500 MG tablet TAKE ONE-HALF TABLET IN THE MORNING AND TAKE ONE TABLET IN THE EVENING  . montelukast (SINGULAIR) 10 MG tablet Take 10 mg by mouth as needed.   Marland Kitchen SALINE NASAL SPRAY NA Place 1 spray into the nose 2 (two) times daily.   No current facility-administered  medications for this visit. (Other)      REVIEW OF SYSTEMS:    ALLERGIES Allergies  Allergen Reactions  . Ace Inhibitors Other (See Comments)    unknown  . Aminoglycosides   . Erythromycin Nausea And Vomiting  . Levofloxacin Other (See Comments)    dehydration  . Penicillins Nausea And Vomiting  . Sulfa Antibiotics   . Ciprofloxacin Rash    hives    PAST MEDICAL HISTORY Past Medical History:  Diagnosis Date  . 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 ALLERGIC RHINITIS 02/23/2009  . Basal cell carcinoma 06/23/2010   R cheek- (CX35FU+ exc )  . COLONIC POLYPS, HX OF 02/23/2009  . Convulsions/seizures ([place]) 10/24/2014  . DIVERTICULITIS, HX OF 02/23/2009  . Ear infection   . Family history of heart disease   . GERD 02/23/2009  . Hyperlipidemia   . HYPERTENSION 02/23/2009  . PONV (postoperative nausea and vomiting)   . Pre-syncope   . Thoracic aortic aneurysm ([place])   . URINARY INCONTINENCE 02/23/2009   Past Surgical History:  Procedure Laterality Date  . APPENDECTOMY  1946  . CATARACT EXTRACTION Bilateral   . EYE SURGERY Right 07/25/2019  . Left ear tube  2001  . Removal of pre-cancer bump on neck  2005  . Ruptured appendics  1946  . Sinus surgery  2008  FAMILY HISTORY Family History  Problem Relation Age of Onset  . Heart disease Mother   . Stroke Father   . Heart disease Father   . Cancer Sister   . Cancer Brother   . Heart disease Other   . Stroke Other     SOCIAL HISTORY Social History   Tobacco Use  . Smoking status: Former Research scientist (life sciences)  . Smokeless tobacco: Never Used  . Tobacco comment: Lives with spouse-independant in all ADLs. [employer] and [employer] at [employer], Married in "52"  Substance Use Topics  . Alcohol use: No  . Drug use: No         OPHTHALMIC EXAM:  Base Eye Exam    Visual Acuity (Snellen - Linear)      Right Left   Dist cc 20/25 20/20   Correction: Glasses       Tonometry (Tonopen, 10:25 AM)      Right Left    Pressure 20 20       Pupils      Pupils Dark Light Shape React APD   Right PERRL 3 2 Round Slow None   Left PERRL 3 2 Round Slow None       Visual Fields (Counting fingers)      Left Right    Full Full       Neuro/Psych    Oriented x3: Yes   Mood/Affect: Normal       Dilation    Both eyes: 1.0% Mydriacyl, 2.5% Phenylephrine @ 10:25 AM        Slit Lamp and Fundus Exam    External Exam      Right Left   External Normal Normal       Slit Lamp Exam      Right Left   Lids/Lashes Normal Normal   Conjunctiva/Sclera [place] and quiet [place] and quiet   Cornea  , Debris in tear film completely resolved, epithelium and cornea are now clear Clear   Anterior Chamber Deep and quiet Deep and quiet   Iris Round and reactive Round and reactive   Lens Clear Clear   Anterior Vitreous Normal Normal       Fundus Exam      Right Left   Posterior Vitreous Vitrectomized Normal   Disc Normal Normal   C/D Ratio 0.4 0.35   Macula No foveal topographic distortion Microaneurysms adjacent to the fovea, no exudate, no thickening   Vessels Normal Normal   Periphery Normal Normal          IMAGING AND PROCEDURES  Imaging and Procedures for 01/14/20  OCT, Retina - OU - Both Eyes       Right Eye Quality was good. Scan locations included subfoveal. Central Foveal Thickness: 322. Progression has improved.   Left Eye Scan locations included subfoveal. Central Foveal Thickness: 260. Progression has improved.   Notes OD, retinal thickening perifoveal region in the setting of possible retinal telangiectasia.  This OCT today which 6 weeks post onset of use of CPAP for newly diagnosed moderate sleep apnea. 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 Patient to continue on CPAP use for moderate sleep apnea, and macular telangiectasis. CME noted clinically OS on OCT and microaneurysms seen clinically on exam. Improved if not at least stable on new therapy, CPAP  2.  3.  Ophthalmic Meds Ordered this visit:  No orders of the defined types were placed in this encounter.      Return in about 3 months (around 04/14/2020) for DILATE OU, OCT.  There are no Patient Instructions on file for this visit.   Explained the diagnoses, plan, and follow up with the patient and they expressed understanding.  Patient expressed understanding of the importance of proper follow up care.   Clent Demark Luvia Orzechowski M.D. Diseases & Surgery of the Retina and Vitreous Retina & Diabetic [place] 01/14/20     Abbreviations: M myopia (nearsighted); A astigmatism; H hyperopia (farsighted); P presbyopia; Mrx spectacle prescription;  CTL contact lenses; OD right eye; OS left eye; OU both eyes  XT exotropia; ET esotropia; PEK punctate epithelial keratitis; PEE punctate epithelial erosions; DES dry eye syndrome; MGD meibomian gland dysfunction; ATs artificial tears; PFAT's preservative free artificial tears; [place] nuclear sclerotic cataract; PSC posterior subcapsular cataract; ERM epi-retinal membrane; PVD posterior vitreous detachment; RD retinal detachment; DM diabetes mellitus; DR diabetic retinopathy; NPDR non-proliferative diabetic retinopathy; PDR proliferative diabetic retinopathy; CSME clinically significant macular edema; DME diabetic macular edema; dbh dot blot hemorrhages; CWS cotton wool spot; POAG primary open angle glaucoma; C/D cup-to-disc ratio; HVF humphrey visual field; GVF goldmann visual field; OCT optical coherence tomography; IOP intraocular pressure; BRVO Branch retinal vein occlusion; CRVO central  retinal vein occlusion; CRAO central retinal artery occlusion; BRAO branch retinal artery occlusion; RT retinal tear; SB scleral buckle; PPV pars plana vitrectomy; VH Vitreous hemorrhage; PRP panretinal laser photocoagulation; IVK intravitreal kenalog; VMT vitreomacular traction; MH Macular hole;  NVD neovascularization of the disc; NVE neovascularization elsewhere; AREDS age related eye disease study; ARMD age related macular degeneration; POAG primary open angle glaucoma; EBMD epithelial/anterior basement membrane dystrophy; ACIOL anterior chamber intraocular lens; IOL intraocular lens; PCIOL posterior chamber intraocular lens; Phaco/IOL phacoemulsification with intraocular lens placement; [place] photorefractive keratectomy; LASIK laser assisted in situ keratomileusis; HTN hypertension; DM diabetes mellitus; COPD chronic obstructive pulmonary disease

## 2020-01-14 NOTE — Assessment & Plan Note (Signed)
Gross CME detected prior to onset of CPAP use 6 weeks ago has now improved slightly OS

## 2020-01-20 ENCOUNTER — Other Ambulatory Visit: Payer: Self-pay

## 2020-01-20 ENCOUNTER — Ambulatory Visit (INDEPENDENT_AMBULATORY_CARE_PROVIDER_SITE_OTHER): Payer: Medicare Other | Admitting: Dermatology

## 2020-01-20 ENCOUNTER — Encounter: Payer: Self-pay | Admitting: Dermatology

## 2020-01-20 DIAGNOSIS — Z1283 Encounter for screening for malignant neoplasm of skin: Secondary | ICD-10-CM

## 2020-01-20 DIAGNOSIS — L821 Other seborrheic keratosis: Secondary | ICD-10-CM | POA: Diagnosis not present

## 2020-01-20 DIAGNOSIS — D485 Neoplasm of uncertain behavior of skin: Secondary | ICD-10-CM | POA: Diagnosis not present

## 2020-01-20 DIAGNOSIS — L570 Actinic keratosis: Secondary | ICD-10-CM | POA: Diagnosis not present

## 2020-01-20 DIAGNOSIS — L719 Rosacea, unspecified: Secondary | ICD-10-CM | POA: Diagnosis not present

## 2020-01-20 DIAGNOSIS — D229 Melanocytic nevi, unspecified: Secondary | ICD-10-CM

## 2020-01-20 DIAGNOSIS — D225 Melanocytic nevi of trunk: Secondary | ICD-10-CM | POA: Diagnosis not present

## 2020-01-20 NOTE — Patient Instructions (Addendum)
Several dermatological issues discussed today with Zachary Padilla date of birth 05-01-1929.  He has hundreds of keratoses mainly on the torso all of which are clinically benign with 1 exception.  A more recent pink rough spot on the lower central abdomen could represent a superficial nonmelanoma cancer and biopsy was obtained.  If Mr. Peer will call the office on Thursday or Friday we will discuss this result.  A precancer on the rim of the left ear was treated with 5second freezing and requires no special home care.  Finally we spent some time discussing his rather diffuse central facial and scalp red bumpy rash.  Clinically this is a mixture of endogenous inflammation (rosacea and/or seborrheic dermatitis) and sun damage.  He will initially apply generic Hytone ointment nightly for the next 4 weeks.  We will determine at that time whether he needs to add a rosacea medicine.Biopsy, Surgery (Curettage) & Surgery (Excision) Aftercare Instructions  1. Okay to remove bandage in 24 hours  2. Wash area with soap and water  3. Apply Vaseline to area twice daily until healed (Not Neosporin)  4. Okay to cover with a Band-Aid to decrease the chance of infection or prevent irritation from clothing; also it's okay to uncover lesion at home.  5. Suture instructions: return to our office in 7-10 or 10-14 days for a nurse visit for suture removal. Variable healing with sutures, if pain or itching occurs call our office. It's okay to shower or bathe 24 hours after sutures are given.  6. The following risks may occur after a biopsy, curettage or excision: bleeding, scarring, discoloration, recurrence, infection (redness, yellow drainage, pain or swelling).  7. For questions, concerns and results call our office at [place] before 4pm & Friday before 3pm. Biopsy results will be available in 1 week.

## 2020-01-20 NOTE — Progress Notes (Signed)
LN2 on right ear x1

## 2020-01-21 ENCOUNTER — Telehealth: Payer: Self-pay | Admitting: *Deleted

## 2020-01-21 MED ORDER — HYDROCORTISONE 2.5 % EX OINT: TOPICAL_OINTMENT | Freq: Two times a day (BID) | CUTANEOUS | 2 refills | Status: DC

## 2020-01-21 NOTE — Telephone Encounter (Signed)
Pharmacy didn't received hytone ointment : resent prescription to pharmacy.

## 2020-01-23 ENCOUNTER — Telehealth: Payer: Self-pay | Admitting: Dermatology

## 2020-01-23 NOTE — Telephone Encounter (Signed)
Left message letting patient know that results aren't back yet.

## 2020-01-23 NOTE — Telephone Encounter (Signed)
Patient left message on office voice mail saying that he was calling to get pathology results from last visit with [name], MD.

## 2020-01-24 ENCOUNTER — Telehealth: Payer: Self-pay | Admitting: *Deleted

## 2020-01-24 NOTE — Telephone Encounter (Signed)
Pathology results to patient, states everything is doing/healing good.

## 2020-01-24 NOTE — Telephone Encounter (Signed)
-----   Message from [name], MD sent at 01/23/2020  8:45 PM EDT ----- Biopsy supports an inflammatory process that may resemble superficial skin cancer but is not cancer and not contagious.  If it heals without any local symptoms, there is no next step necessary.  If there is itching, we will prescribe an anti-inflammatory cream.

## 2020-02-15 NOTE — Progress Notes (Signed)
   Follow-Up Visit   Subjective  Zachary Padilla is a 86 y.o. male who presents for the following: Annual Exam (spots on forhead & scalp).  General skin check Location:  Duration:  Quality:  Associated Signs/Symptoms: Modifying Factors:  Severity:  Timing: Context: History of nonmelanoma skin cancer  Objective  Well appearing patient in no apparent distress; mood and affect are within normal limits.  All skin waist up examined.   Assessment & Plan    Neoplasm of uncertain behavior of skin Lower abdominal  Skin / nail biopsy Type of biopsy: tangential   Informed consent: discussed and consent obtained   Timeout: patient name, date of birth, surgical site, and procedure verified   Procedure prep:  Patient was prepped and draped in usual sterile fashion Prep type:  Chlorhexidine Anesthesia: the lesion was anesthetized in a standard fashion   Anesthetic:  1% lidocaine w/ epinephrine 1-100,000 local infiltration Instrument used: flexible razor blade   Hemostasis achieved with: ferric subsulfate   Outcome: patient tolerated procedure well   Post-procedure details: wound care instructions given    Specimen 1 - Surgical pathology Differential Diagnosis: CIS ISK Fungical Check Margins: No  AK (actinic keratosis) Left Mid Helix  Destruction of lesion - Left Mid Helix Complexity: simple   Destruction method: cryotherapy   Informed consent: discussed and consent obtained   Timeout:  patient name, date of birth, surgical site, and procedure verified Lesion destroyed using liquid nitrogen: Yes   Region frozen until ice ball extended beyond lesion: Yes   Cryotherapy cycles:  5 Outcome: patient tolerated procedure well with no complications    Seborrheic keratosis (2) Left Abdomen (side) - Upper; Mid Back  Leave if stable  Nevus Mid Back  Annual skin examination  Rosacea (2) Left Malar Cheek; Right Malar Cheek  Currently rash not bothersome and with having to use  aCovid mask, intervention deferred.  Several dermatological issues discussed today with Zachary Padilla date of birth [birth year].  He has hundreds of keratoses mainly on the torso all of which are clinically benign with 1 exception.  A more recent pink rough spot on the lower central abdomen could represent a superficial nonmelanoma cancer and biopsy was obtained.  If [name] will call the office on Thursday or Friday we will discuss this result.  A precancer on the rim of the left ear was treated with 5second freezing and requires no special home care.  Finally we spent some time discussing his rather diffuse central facial and scalp red bumpy rash.  Clinically this is a mixture of endogenous inflammation (rosacea and/or seborrheic dermatitis) and sun damage.  He will initially apply generic Hytone ointment nightly for the next 4 weeks.  We will determine at that time whether he needs to add a rosacea medicine  Skin cancer screening performed today.  I, [name], MD, have reviewed all documentation for this visit.  The documentation on 02/16/20 for the exam, diagnosis, procedures, and orders are all accurate and complete.

## 2020-02-16 ENCOUNTER — Encounter: Payer: Self-pay | Admitting: Dermatology

## 2020-02-19 ENCOUNTER — Encounter: Payer: Self-pay | Admitting: Adult Health

## 2020-02-19 ENCOUNTER — Other Ambulatory Visit: Payer: Self-pay

## 2020-02-19 ENCOUNTER — Ambulatory Visit (INDEPENDENT_AMBULATORY_CARE_PROVIDER_SITE_OTHER): Payer: Medicare Other | Admitting: Adult Health

## 2020-02-19 VITALS — BP 126/80 | HR 66 | Ht 68.0 in | Wt 158.6 lb

## 2020-02-19 DIAGNOSIS — R569 Unspecified convulsions: Secondary | ICD-10-CM

## 2020-02-19 NOTE — Progress Notes (Signed)
PATIENT: Zachary Padilla DOB: [birth year]  REASON FOR VISIT: follow up HISTORY FROM: patient  HISTORY OF PRESENT ILLNESS: Today 02/19/20:  [name] is an 86 year old male with a history of seizures.  He returns today for follow-up.  He denies any seizure events.  Continues on Keppra 250 mg in the morning and 500 in the evening.  Does report that he has stage III kidney disease.  Overall he feels that he is doing well.  Returns today for evaluation.  HISTORY 08/20/19:  Mr. Jipson is an 86 year old male with a history of seizures.  He returns today for follow-up.  He denies any seizure events.  Continues on Keppra 250 mg in the morning and 500 mg in the evening.  He denies any further changes with his memory.  He is able to complete all ADLs independently.  He operates a Teacher, music.  He returns today for an evaluation.  REVIEW OF SYSTEMS: Out of a complete 14 system review of symptoms, the patient complains only of the following symptoms, and all other reviewed systems are negative.  See HPI  ALLERGIES: Allergies  Allergen Reactions  . Ace Inhibitors Other (See Comments)    unknown  . Aminoglycosides   . Erythromycin Nausea And Vomiting  . Levofloxacin Other (See Comments)    dehydration  . Penicillins Nausea And Vomiting  . Sulfa Antibiotics   . Ciprofloxacin Rash    hives    HOME MEDICATIONS: Outpatient Medications Prior to Visit  Medication Sig Dispense Refill  . Azelaic Acid (FINACEA) 15 % cream Apply 1 application topically 2 (two) times daily as needed. For Roscacea    . COZAAR 50 MG tablet Take 0.5 tablets (25 mg total) by mouth daily. 45 tablet 3  . guaiFENesin (MUCINEX) 600 MG 12 hr tablet Take by mouth as needed.     . hydrocortisone 2.5 % ointment Apply topically 2 (two) times daily. 30 g 2  . levETIRAcetam (KEPPRA) 500 MG tablet TAKE ONE-HALF TABLET IN THE MORNING AND TAKE ONE TABLET IN THE EVENING 135 tablet 3  . Dextromethorphan-guaiFENesin (MUCINEX DM)  30-600 MG TB12 Take by mouth as directed. (Patient not taking: Reported on 01/20/2020)    . montelukast (SINGULAIR) 10 MG tablet Take 10 mg by mouth as needed.  (Patient not taking: Reported on 01/20/2020)    . SALINE NASAL SPRAY NA Place 1 spray into the nose 2 (two) times daily. (Patient not taking: Reported on 01/20/2020)     No facility-administered medications prior to visit.    PAST MEDICAL HISTORY: Past Medical History:  Diagnosis Date  . 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 ALLERGIC RHINITIS 02/23/2009  . Basal cell carcinoma 06/23/2010   R cheek- (CX35FU+ exc )  . COLONIC POLYPS, HX OF 02/23/2009  . Convulsions/seizures ([place]) 10/24/2014  . DIVERTICULITIS, HX OF 02/23/2009  . Ear infection   . Family history of heart disease   . GERD 02/23/2009  . Hyperlipidemia   . HYPERTENSION 02/23/2009  . PONV (postoperative nausea and vomiting)   . Pre-syncope   . Thoracic aortic aneurysm ([place])   . URINARY INCONTINENCE 02/23/2009    PAST SURGICAL HISTORY: Past Surgical History:  Procedure Laterality Date  . APPENDECTOMY  1946  . CATARACT EXTRACTION Bilateral   . EYE SURGERY Right 07/25/2019  . Left ear tube  2001  . Removal of pre-cancer bump on neck  2005  . Ruptured appendics  1946  . Sinus surgery  2008    FAMILY HISTORY: Family History  Problem Relation Age of Onset  . Heart disease Mother   . Stroke Father   . Heart disease Father   . Cancer Sister   . Cancer Brother   . Heart disease Other   . Stroke Other     SOCIAL HISTORY: Social History   Socioeconomic History  . Marital status: Married    Spouse name: Not on file  . Number of children: 2  . Years of education: 6  . Highest education level: Not on file  Occupational History  . Occupation: retired  Tobacco Use  . Smoking status: Former Research scientist (life sciences)  . Smokeless tobacco: Never Used  . Tobacco comment: Lives with spouse-independant in all ADLs. [employer] and [employer] at [employer], Married in "52"    Substance and Sexual Activity  . Alcohol use: No  . Drug use: No  . Sexual activity: Yes  Other Topics Concern  . Not on file  Social History Narrative   Patient occasionally drinks tea.   Patient is right handed.   Social Determinants of Health   Financial Resource Strain:   . Difficulty of Paying Living Expenses: Not on file  Food Insecurity:   . Worried About Charity fundraiser in the Last Year: Not on file  . Ran Out of Food in the Last Year: Not on file  Transportation Needs:   . Lack of Transportation (Medical): Not on file  . Lack of Transportation (Non-Medical): Not on file  Physical Activity:   . Days of Exercise per Week: Not on file  . Minutes of Exercise per Session: Not on file  Stress:   . Feeling of Stress : Not on file  Social Connections:   . Frequency of Communication with Friends and Family: Not on file  . Frequency of Social Gatherings with Friends and Family: Not on file  . Attends Religious Services: Not on file  . Active Member of Clubs or Organizations: Not on file  . Attends Archivist Meetings: Not on file  . Marital Status: Not on file  Intimate Partner Violence:   . Fear of Current or Ex-Partner: Not on file  . Emotionally Abused: Not on file  . Physically Abused: Not on file  . Sexually Abused: Not on file      PHYSICAL EXAM  Vitals:   02/19/20 1504  BP: 126/80  Pulse: 66  Weight: 158 lb 9.6 oz (71.9 kg)  Height: 5\' 8"  (1.727 m)   Body mass index is 24.12 kg/m.  Generalized: Well developed, in no acute distress   Neurological examination  Mentation: Alert oriented to time, place, history taking. Follows all commands speech and language fluent Cranial nerve II-XII: Pupils were equal round reactive to light. Extraocular movements were full, visual field were full on confrontational test. Head turning and shoulder shrug  were normal and symmetric. Motor: The motor testing reveals 5 over 5 strength of all 4 extremities.  Good symmetric motor tone is noted throughout.  Sensory: Sensory testing is intact to soft touch on all 4 extremities. No evidence of extinction is noted.  Coordination: Cerebellar testing reveals good finger-nose-finger and heel-to-shin bilaterally.  Gait and station: Gait is normal.  Reflexes: Deep tendon reflexes are symmetric and normal bilaterally.   DIAGNOSTIC DATA (LABS, IMAGING, TESTING) - I reviewed patient records, labs, notes, testing and imaging myself where available.  Lab Results  Component Value Date   WBC 5.5 03/22/2016   HGB 14.3 03/22/2016   HCT 41.9 03/22/2016  MCV 96.5 03/22/2016   PLT 160 03/22/2016      Component Value Date/Time   NA 134 (L) 03/22/2016 1128   K 4.7 03/22/2016 1128   CL 101 03/22/2016 1128   CO2 27 03/22/2016 1128   GLUCOSE 80 03/22/2016 1128   BUN 17 03/22/2016 1128   CREATININE 1.27 (H) 03/22/2016 1128   CALCIUM 8.7 03/22/2016 1128   PROT 6.6 03/22/2016 1128   ALBUMIN 3.9 03/22/2016 1128   AST 22 03/22/2016 1128   ALT 15 03/22/2016 1128   ALKPHOS 73 03/22/2016 1128   BILITOT 1.0 03/22/2016 1128   GFRNONAA 51 (L) 03/22/2016 1128   GFRAA 59 (L) 03/22/2016 1128   Lab Results  Component Value Date   CHOL 123 03/22/2016   HDL 50 03/22/2016   LDLCALC 63 03/22/2016   TRIG 52 03/22/2016   CHOLHDL 2.5 03/22/2016    Lab Results  Component Value Date   TSH 1.16 03/22/2016      ASSESSMENT AND PLAN 86 y.o. year old male  has a past medical history of ACNE ROSACEA (09/09/2009), ALLERGIC RHINITIS (02/23/2009), Basal cell carcinoma (06/23/2010), COLONIC POLYPS, HX OF (02/23/2009), Convulsions/seizures ([place]) (10/24/2014), DIVERTICULITIS, HX OF (02/23/2009), Ear infection, Family history of heart disease, GERD (02/23/2009), Hyperlipidemia, HYPERTENSION (02/23/2009), PONV (postoperative nausea and vomiting), Pre-syncope, Thoracic aortic aneurysm ([place]), and URINARY INCONTINENCE (02/23/2009). here with :  1: Seizures  - Continue Keppra 250 mg  BID - FU in 6 months or sooner if needed.    I spent 25 minutes of face-to-face and non-face-to-face time with patient.  This included previsit chart review, lab review, study review, order entry, electronic health record documentation, patient education.  Ward Givens, MSN, NP-C 02/19/2020, 3:12 PM [facility] [address] [place], [place] [address] [phone]

## 2020-02-19 NOTE — Patient Instructions (Signed)
Your Plan:  Continue Keppra  If your symptoms worsen or you develop new symptoms please let us know.    Thank you for coming to see us at [facility]. I hope we have been able to provide you high quality care today.  You may receive a patient satisfaction survey over the next few weeks. We would appreciate your feedback and comments so that we may continue to improve ourselves and the health of our patients.  

## 2020-03-04 ENCOUNTER — Ambulatory Visit: Payer: Medicare Other | Admitting: Adult Health

## 2020-04-14 ENCOUNTER — Other Ambulatory Visit: Payer: Self-pay

## 2020-04-14 ENCOUNTER — Ambulatory Visit (INDEPENDENT_AMBULATORY_CARE_PROVIDER_SITE_OTHER): Payer: Medicare Other | Admitting: Ophthalmology

## 2020-04-14 ENCOUNTER — Encounter (INDEPENDENT_AMBULATORY_CARE_PROVIDER_SITE_OTHER): Payer: Self-pay | Admitting: Ophthalmology

## 2020-04-14 DIAGNOSIS — H35071 Retinal telangiectasis, right eye: Secondary | ICD-10-CM

## 2020-04-14 DIAGNOSIS — H35372 Puckering of macula, left eye: Secondary | ICD-10-CM

## 2020-04-14 DIAGNOSIS — H35072 Retinal telangiectasis, left eye: Secondary | ICD-10-CM

## 2020-04-14 NOTE — Progress Notes (Signed)
04/14/2020     CHIEF COMPLAINT Patient presents for Retina Follow Up   HISTORY OF PRESENT ILLNESS: Zachary Padilla is a 86 y.o. male who presents to the clinic today for:   HPI    Retina Follow Up    Patient presents with  Other.  In both eyes.  This started 3 months ago.  Severity is mild.  Duration of 3 months.  Since onset it is stable.          Comments    3 Month F/U OU  Pt sts OS seems "weaker" since last visit. Pt denies ocular pain or flashes. Pt reports "specks" OU. Pt has been using CPAP nightly x 4 months. Pt c/o itching OD off and on.       Last edited by Rockie Neighbours, [place] on 04/14/2020 10:21 AM. (History)      Referring physician: Lajean Manes, MD [address],  [place] [address]  HISTORICAL INFORMATION:   Selected notes from the [facility]: No current outpatient medications on file. (Ophthalmic Drugs)   No current facility-administered medications for this visit. (Ophthalmic Drugs)   Current Outpatient Medications (Other)  Medication Sig  . Azelaic Acid (FINACEA) 15 % cream Apply 1 application topically 2 (two) times daily as needed. For Roscacea  . COZAAR 50 MG tablet Take 0.5 tablets (25 mg total) by mouth daily.  Marland Kitchen guaiFENesin (MUCINEX) 600 MG 12 hr tablet Take by mouth as needed.   . hydrocortisone 2.5 % ointment Apply topically 2 (two) times daily.  Marland Kitchen levETIRAcetam (KEPPRA) 500 MG tablet TAKE ONE-HALF TABLET IN THE MORNING AND TAKE ONE TABLET IN THE EVENING   No current facility-administered medications for this visit. (Other)      REVIEW OF SYSTEMS:    ALLERGIES Allergies  Allergen Reactions  . Ace Inhibitors Other (See Comments)    unknown  . Aminoglycosides   . Erythromycin Nausea And Vomiting  . Levofloxacin Other (See Comments)    dehydration  . Penicillins Nausea And Vomiting  . Sulfa Antibiotics   . Ciprofloxacin Rash    hives    PAST MEDICAL HISTORY Past  Medical History:  Diagnosis Date  . 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 ALLERGIC RHINITIS 02/23/2009  . Basal cell carcinoma 06/23/2010   R cheek- (CX35FU+ exc )  . COLONIC POLYPS, HX OF 02/23/2009  . Convulsions/seizures ([name]) 10/24/2014  . DIVERTICULITIS, HX OF 02/23/2009  . Ear infection   . Family history of heart disease   . GERD 02/23/2009  . Hyperlipidemia   . HYPERTENSION 02/23/2009  . PONV (postoperative nausea and vomiting)   . Pre-syncope   . Thoracic aortic aneurysm ([place])   . URINARY INCONTINENCE 02/23/2009   Past Surgical History:  Procedure Laterality Date  . APPENDECTOMY  1946  . CATARACT EXTRACTION Bilateral   . EYE SURGERY Right 07/25/2019  . Left ear tube  2001  . Removal of pre-cancer bump on neck  2005  . Ruptured appendics  1946  . Sinus surgery  2008    FAMILY HISTORY Family History  Problem Relation Age of Onset  . Heart disease Mother   . Stroke Father   . Heart disease Father   . Cancer Sister   . Cancer Brother   . Heart disease Other   . Stroke Other     SOCIAL HISTORY Social History   Tobacco Use  . Smoking status: Former Research scientist (life sciences)  .  Smokeless tobacco: Never Used  . Tobacco comment: Lives with spouse-independant in all ADLs. [employer] and [employer] at [employer], Married in "52"  Substance Use Topics  . Alcohol use: No  . Drug use: No         OPHTHALMIC EXAM:  Base Eye Exam    Visual Acuity (ETDRS)      Right Left   Dist cc 20/30 +2 20/20 -1   Dist ph cc NI    Correction: Glasses       Tonometry (Tonopen, 10:14 AM)      Right Left   Pressure 16 19       Pupils      Pupils Dark Light Shape React APD   Right PERRL 4 3 Round Slow None   Left PERRL 4 3 Round Slow None       Visual Fields (Counting fingers)      Left Right    Full Full       Extraocular Movement      Right Left    Full Full       Neuro/Psych    Oriented x3: Yes   Mood/Affect: Normal       Dilation    Both eyes: 1.0% Mydriacyl,  2.5% Phenylephrine @ 10:20 AM        Slit Lamp and Fundus Exam    External Exam      Right Left   External Normal Normal       Slit Lamp Exam      Right Left   Lids/Lashes Normal Normal   Conjunctiva/Sclera [place] and quiet [place]e and quiet   Cornea  , Debris in tear film completely resolved, epithelium and cornea are now clear Clear   Anterior Chamber Deep and quiet Deep and quiet   Iris Round and reactive Round and reactive   Lens Clear Clear   Anterior Vitreous Normal Normal       Fundus Exam      Right Left   Posterior Vitreous Vitrectomized Normal   Disc Normal Normal   C/D Ratio 0.5 0.7   Macula No foveal topographic distortion Microaneurysms adjacent to the fovea, temp and nas no exudate, no thickening   Vessels Normal Normal   Periphery Normal Normal          IMAGING AND PROCEDURES  Imaging and Procedures for 04/14/20  OCT, Retina - OU - Both Eyes       Right Eye Quality was good. Scan locations included subfoveal. Central Foveal Thickness: 317. Progression has been stable. Findings include epiretinal membrane.   Left Eye Quality was good. Scan locations included subfoveal. Central Foveal Thickness: 265. Progression has been stable.   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 Macular pucker, left eye  H35.372     1.  History of vitrectomy membrane peel right eye, acuity improved  2.  History of MAC-TEL, left no active progression, no active CME, still using CPAP now for approximately  3.  Ophthalmic Meds Ordered this visit:  No orders of the defined types were placed in this encounter.      Return in about 6 months (around 10/13/2020) for dilate, OCT.  There are no Patient Instructions on file for this visit.   Explained the diagnoses, plan, and follow up with the patient and they expressed understanding.  Patient expressed understanding of the importance of proper follow up care.   Clent Demark Arminda Foglio M.D. Diseases & Surgery of the Retina and Vitreous [facility] [place] 04/14/20     Abbreviations: M myopia (nearsighted); A astigmatism; H hyperopia (farsighted); P presbyopia; Mrx spectacle prescription;  CTL contact lenses; OD right eye; OS left eye; OU both eyes  XT exotropia; ET esotropia; PEK punctate epithelial keratitis; PEE punctate epithelial erosions; DES dry eye syndrome; MGD meibomian gland dysfunction; ATs artificial tears; PFAT's preservative free artificial tears; [place] nuclear sclerotic cataract; PSC posterior subcapsular cataract; ERM epi-retinal membrane; PVD posterior vitreous detachment; RD retinal detachment; DM diabetes mellitus; DR diabetic retinopathy; NPDR non-proliferative diabetic retinopathy; PDR proliferative diabetic retinopathy; CSME clinically significant macular edema; DME diabetic macular edema; dbh dot blot hemorrhages; CWS cotton wool spot; POAG primary open angle glaucoma; C/D cup-to-disc ratio; HVF humphrey visual field; GVF goldmann visual field; OCT optical coherence tomography; IOP  intraocular pressure; BRVO Branch retinal vein occlusion; CRVO central retinal vein occlusion; CRAO central retinal artery occlusion; BRAO branch retinal artery occlusion; RT retinal tear; SB scleral buckle; PPV pars plana vitrectomy; VH Vitreous hemorrhage; PRP panretinal laser photocoagulation; IVK intravitreal kenalog; VMT vitreomacular traction; MH Macular hole;  NVD neovascularization of the disc; NVE neovascularization elsewhere; AREDS age related eye disease study; ARMD age related macular degeneration; POAG primary open angle glaucoma; EBMD epithelial/anterior basement membrane dystrophy; ACIOL anterior chamber intraocular lens; IOL intraocular lens; PCIOL posterior chamber intraocular lens; Phaco/IOL phacoemulsification with intraocular lens placement; [place] photorefractive keratectomy; LASIK laser assisted in situ keratomileusis; HTN hypertension; DM diabetes mellitus; COPD chronic obstructive pulmonary disease

## 2020-04-14 NOTE — Assessment & Plan Note (Addendum)
Residual microaneurysms perifoveal, no active leakages  Patient continues on active use of CPAP with excellent compliance, onset of use July 2021, August 2021, resulting  improved sense of wellbeing, and stable maculopathy  No change in thickness of the fovea OS due to minor epiretinal membrane

## 2020-04-14 NOTE — Assessment & Plan Note (Signed)
Minor and no effect on acuity OS

## 2020-04-14 NOTE — Assessment & Plan Note (Addendum)
No active maculopathy, no active CME, and improve thickness from 331 prior to onset of CPAP use to now 317 m

## 2020-05-25 ENCOUNTER — Telehealth: Payer: Self-pay | Admitting: Cardiovascular Disease

## 2020-05-25 NOTE — Telephone Encounter (Signed)
Spoke with patient to reschedule his 05/26/20 appt with Dr. Gardiner Ramus to 07/01/20 at 3:15 pm---patient voiced his understanding.

## 2020-05-26 ENCOUNTER — Ambulatory Visit: Payer: Medicare Other | Admitting: Cardiovascular Disease

## 2020-06-11 ENCOUNTER — Other Ambulatory Visit: Payer: Self-pay

## 2020-06-11 ENCOUNTER — Encounter: Payer: Self-pay | Admitting: Dietician

## 2020-06-11 ENCOUNTER — Encounter: Payer: Medicare Other | Attending: Nephrology | Admitting: Dietician

## 2020-06-11 DIAGNOSIS — N1831 Chronic kidney disease, stage 3a: Secondary | ICD-10-CM | POA: Insufficient documentation

## 2020-06-11 NOTE — Progress Notes (Signed)
  Medical Nutrition Therapy  Appointment Start time:  1400  Appointment End time:  5188 Patient arrived late due to another appointment prior   Primary concerns today: Patient is here today alone. He has arrived late due to a late appointment with his MD. He states that his blood pressure has been a little high recently and has swelling in his ankles most days (sometimes worse than others). Referral diagnosis: CKD stage 3 Preferred learning style:  no preference indicated Learning readiness:  ready   NUTRITION ASSESSMENT   Anthropometrics  69" 163 lbs 06/11/2020  155 lbs 11/2019  Clinical Medical Hx: CKD stage 3, HTN, HLD, OSA on C-pap, vitamin D deficiency Medications: see list to include Type 2 Diabetes Labs: BUN 22, Creatinine 1.31, Potassium 5.3, eGFR 48, Phos 2.8, Vitamin D 25.8 (11/20/2019 Notable Signs/Symptoms: swelling in feet  Lifestyle & Dietary Hx Patient lives with his wife.  They share shopping and cooking.  He worked in Engineer, production prior to retiring.    Supplements: vitamin D 1000 units daily Sleep: 7 Current average weekly physical activity: Was walking through the neighborhood but has reduced this due to the weather  24-Hr Dietary Recall States that he likes natural foods. They get meals from "[employer]" and prepare meals that are low in sodium. First Meal: instant oatmeal OR cornflakes or rice flakes, occasional fruit or OJ, almond milk or soy milk Snack: none Second Meal: pasta OR chicken, rice, vegetables, potato chips (organic) or corn chips Snack: rare chips Third Meal: soup and salad or sandwich Snack: none Beverages: water, lemonade, raspberry lemonade, iced tea, occasional OJ  Estimated Energy Needs Calories: 2000 Protein: 60g   NUTRITION DIAGNOSIS  NB-1.1 Food and nutrition-related knowledge deficit As related to Nutrition and CKD.  As evidenced by diet hx and patient report.   NUTRITION INTERVENTION  Nutrition education (E-1) on the  following topics:  . Low sodium diet . Low potassium diet (including avoidance of Potassium Chloride salt substitutes) . 60 gram protein diet . Low phosphorous . Resources  Handouts Provided Include   NKD National Kidney Diet Dishing Up a Kidney Friendly Meal for those not on dialysis  Learning Style & Readiness for Change Teaching method utilized: Visual & Auditory  Demonstrated degree of understanding via: Teach Back  Barriers to learning/adherence to lifestyle change: none  Goals Stop using "Lite Salt" and "No Salt".  These contain potassium and you should avoid these. Continue to avoid added salt. Ask for low sodium meals when you order from the meal company. Ask for your food to be prepared without salt when eating out. Choose low sodium Boar's head or other low sodium meat or use leftover chicken. [place] has a low sodium pasta sauce that is very good.n  Use small amounts (1/2 cup or less particularly if your potassium is high).  Unfortified almond milk is fine. Avoid foods that have Phos... in the ingredient list. Choose small amounts of swiss, mozzarella, ricotta when you desire cheese.  Your blood potassium was high in July 2021.  This may be due to the salt substitute which contains added potassium or it could have been due to another part of your diet. Choose fruits and vegetables on the lower potassium list.  Resource: [employer]  MONITORING & EVALUATION Dietary intake, weekly physical activity, and label reading prn.  Next Steps  Patient is to call prn for appointment or with questions.Marland Kitchen

## 2020-06-25 ENCOUNTER — Telehealth: Payer: Self-pay

## 2020-06-25 ENCOUNTER — Other Ambulatory Visit: Payer: Self-pay

## 2020-06-25 ENCOUNTER — Ambulatory Visit (HOSPITAL_COMMUNITY): Payer: Medicare Other | Attending: Internal Medicine

## 2020-06-25 DIAGNOSIS — I4510 Unspecified right bundle-branch block: Secondary | ICD-10-CM | POA: Insufficient documentation

## 2020-06-25 DIAGNOSIS — I712 Thoracic aortic aneurysm, without rupture, unspecified: Secondary | ICD-10-CM

## 2020-06-25 DIAGNOSIS — E7800 Pure hypercholesterolemia, unspecified: Secondary | ICD-10-CM | POA: Insufficient documentation

## 2020-06-25 DIAGNOSIS — E782 Mixed hyperlipidemia: Secondary | ICD-10-CM | POA: Diagnosis present

## 2020-06-25 DIAGNOSIS — I10 Essential (primary) hypertension: Secondary | ICD-10-CM | POA: Insufficient documentation

## 2020-06-25 LAB — ECHOCARDIOGRAM COMPLETE
Area-P 1/2: 2.8 cm2
S' Lateral: 2 cm

## 2020-06-25 NOTE — Telephone Encounter (Signed)
Pt called and wanted to speak with kristin to make sure furosemide prescribed by kidney dr is safe to take with all meds prescribed from [facility]

## 2020-06-25 NOTE — Telephone Encounter (Signed)
Assured patient that he will be fine to take the furosemide.

## 2020-07-01 ENCOUNTER — Encounter: Payer: Self-pay | Admitting: Cardiovascular Disease

## 2020-07-01 ENCOUNTER — Other Ambulatory Visit: Payer: Self-pay

## 2020-07-01 ENCOUNTER — Ambulatory Visit (INDEPENDENT_AMBULATORY_CARE_PROVIDER_SITE_OTHER): Payer: Medicare Other | Admitting: Cardiovascular Disease

## 2020-07-01 DIAGNOSIS — I712 Thoracic aortic aneurysm, without rupture, unspecified: Secondary | ICD-10-CM

## 2020-07-01 DIAGNOSIS — I10 Essential (primary) hypertension: Secondary | ICD-10-CM

## 2020-07-01 DIAGNOSIS — E782 Mixed hyperlipidemia: Secondary | ICD-10-CM

## 2020-07-01 NOTE — Progress Notes (Signed)
07/01/2020 Zachary Padilla   [birth year]  [id number]  Primary Physician Lajean Manes, MD Primary Cardiologist: Lorretta Harp MD Lupe Carney, [place]  HPI:  Zachary Padilla is a 87 y.o.  thinand fit appearing married Caucasian male father of 2, grandfather 4 grandchildren who I saw lastsaw in the office 05/07/2019.Marland Kitchen He has a history of hypertension, mild hyperlipidemia and family history of heart disease as well as chronic right bundle branch block. His mother died of an MI at age 87. A Myoview stress test performed 01/19/12 was entirely normal. He denies chest pain or shortness of breath. A chest CT angiogram was performed 03/22/12 for evaluation of potential lung cancer did incidentally reveal a 4.3 cm descending thoracic aortic aneurysm which he has been asymptomatic fromthis was rechecked April of this year and was unchanged. He denies chest pain or shortness of breath. His most recent CT scan performed7/12/17revealed his ascending aneurysm measuring4.3cm at its maximum dimension,unchanged from prior studies.   Since I saw him a year ago he remained stable.    He denies chest pain or shortness of breath.  His recent 2D echo performed 06/25/2020 revealed normal LV systolic function with a thoracic aortic dimension of 43 mm which has remained stable.   Current Meds  Medication Sig  . Azelaic Acid 15 % cream Apply 1 application topically 2 (two) times daily as needed. For Roscacea  . cholecalciferol (VITAMIN D3) 25 MCG (1000 UNIT) tablet Take 1,000 Units by mouth daily.  Marland Kitchen COZAAR 50 MG tablet Take 0.5 tablets (25 mg total) by mouth daily.  Marland Kitchen guaiFENesin (MUCINEX) 600 MG 12 hr tablet Take by mouth as needed.  . hydrocortisone 2.5 % ointment Apply topically 2 (two) times daily.  Marland Kitchen levETIRAcetam (KEPPRA) 500 MG tablet TAKE ONE-HALF TABLET IN THE MORNING AND TAKE ONE TABLET IN THE EVENING  . mupirocin ointment (BACTROBAN) 2 % Apply to inside of each side of nose 2 times / day; squeeze  nasal tip to help disperse medication after applying     Allergies  Allergen Reactions  . Ace Inhibitors Other (See Comments)    unknown  . Aminoglycosides   . Erythromycin Nausea And Vomiting  . Levofloxacin Other (See Comments)    dehydration  . Penicillins Nausea And Vomiting  . Sulfa Antibiotics   . Ciprofloxacin Rash    hives    Social History   Socioeconomic History  . Marital status: Married    Spouse name: Not on file  . Number of children: 2  . Years of education: 61  . Highest education level: Not on file  Occupational History  . Occupation: retired  Tobacco Use  . Smoking status: Former Research scientist (life sciences)  . Smokeless tobacco: Never Used  . Tobacco comment: Lives with spouse-independant in all ADLs. [employer] and [employer] at [employer], Married in "52"  Substance and Sexual Activity  . Alcohol use: No  . Drug use: No  . Sexual activity: Yes  Other Topics Concern  . Not on file  Social History Narrative   Patient occasionally drinks tea.   Patient is right handed.   Social Determinants of Health   Financial Resource Strain: Not on file  Food Insecurity: Not on file  Transportation Needs: Not on file  Physical Activity: Not on file  Stress: Not on file  Social Connections: Not on file  Intimate Partner Violence: Not on file     Review of Systems: General: negative for chills, fever, night sweats or  weight changes.  Cardiovascular: negative for chest pain, dyspnea on exertion, edema, orthopnea, palpitations, paroxysmal nocturnal dyspnea or shortness of breath Dermatological: negative for rash Respiratory: negative for cough or wheezing Urologic: negative for hematuria Abdominal: negative for nausea, vomiting, diarrhea, bright red blood per rectum, melena, or hematemesis Neurologic: negative for visual changes, syncope, or dizziness All other systems reviewed and are otherwise negative except as noted above.    Blood pressure 114/61,  pulse (!) 119, height 5\' 10"  (1.778 m), weight 159 lb (72.1 kg), SpO2 97 %.  General appearance: alert and no distress Neck: no adenopathy, no carotid bruit, no JVD, supple, symmetrical, trachea midline and thyroid not enlarged, symmetric, no tenderness/mass/nodules Lungs: clear to auscultation bilaterally Heart: regular rate and rhythm, S1, S2 normal, no murmur, click, rub or gallop Extremities: extremities normal, atraumatic, no cyanosis or edema Pulses: 2+ and symmetric Skin: Skin color, texture, turgor normal. No rashes or lesions Neurologic: Alert and oriented X 3, normal strength and tone. Normal symmetric reflexes. Normal coordination and gait  EKG sinus tachycardia 119 with right bundle branch block.  I personally reviewed this EKG.  ASSESSMENT AND PLAN:   Essential hypertension History of essential hypertension a blood pressure measured today at 114/61.  He is on Cozaar.  Hyperlipidemia History of hyperlipidemia not on statin therapy followed by his PCP  Thoracic aortic aneurysm History of small thoracic aortic aneurysm measuring 43 mm by recent 2D echo performed 06/25/2020.  Given the stability of this, at his age, we will not recheck this again.      Lorretta Harp MD FACP,FACC,FAHA, [facility] 07/01/2020 3:51 PM

## 2020-07-01 NOTE — Assessment & Plan Note (Signed)
History of small thoracic aortic aneurysm measuring 43 mm by recent 2D echo performed 06/25/2020.  Given the stability of this, at his age, we will not recheck this again.

## 2020-07-01 NOTE — Assessment & Plan Note (Signed)
History of hyperlipidemia not on statin therapy followed by his PCP 

## 2020-07-01 NOTE — Patient Instructions (Signed)

## 2020-07-01 NOTE — Assessment & Plan Note (Signed)
History of essential hypertension a blood pressure measured today at 114/61.  He is on Cozaar.

## 2020-07-03 ENCOUNTER — Other Ambulatory Visit (HOSPITAL_BASED_OUTPATIENT_CLINIC_OR_DEPARTMENT_OTHER): Payer: Self-pay

## 2020-07-03 DIAGNOSIS — G4733 Obstructive sleep apnea (adult) (pediatric): Secondary | ICD-10-CM

## 2020-07-03 DIAGNOSIS — G4731 Primary central sleep apnea: Secondary | ICD-10-CM

## 2020-07-09 ENCOUNTER — Ambulatory Visit: Payer: Medicare Other | Admitting: Dietician

## 2020-07-30 ENCOUNTER — Telehealth: Payer: Self-pay | Admitting: Cardiovascular Disease

## 2020-07-30 ENCOUNTER — Encounter (HOSPITAL_BASED_OUTPATIENT_CLINIC_OR_DEPARTMENT_OTHER): Payer: Medicare Other | Admitting: Internal Medicine

## 2020-07-30 NOTE — Telephone Encounter (Signed)
Leave to [employer]

## 2020-07-30 NOTE — Telephone Encounter (Signed)
Spoke with pt on the phone regarding Dr. Kennon Holter recommendations. At this time we will leave medication changes and titrations to Dr. Marval Regal.  Zachary Padilla is concerned that he may have some hypertension due to stopping the cozaar and is also concerned about his kidney numbers with him starting to take lasix.  Explained to pt that if he has issues with blood pressure during this 2 week trial that he can give Dr. Elissa Hefty office a call. Pt explains that their office is not very quick to respond to his concerns. Explained to pt that we are here to help if there are any issues. All questions answered. Pt verbalizes understand and is very grateful for the call and our support.

## 2020-07-30 NOTE — Telephone Encounter (Signed)
  Patient would like to know if Dr Marval Regal has been in contact with Dr Gwenlyn Found about his situation. He would also like to speak to the nurse regarding the recent changes in his medication. Please call. If no answer on home phone, can call his cell number that is listed.

## 2020-07-30 NOTE — Telephone Encounter (Signed)
Pt would like to make Dr. Gwenlyn Found that Dr. Marval Regal is doing some medication changes. Dr. Marval Regal would like to take pt off cozaar and just have him take lasix for blood pressure control and to help with electrolytes. Pt currently battles with low sodium and high potassium. Explained to pt that I would forward this to Dr. Gwenlyn Found to make him aware.

## 2020-08-12 ENCOUNTER — Encounter (HOSPITAL_BASED_OUTPATIENT_CLINIC_OR_DEPARTMENT_OTHER): Payer: Medicare Other | Admitting: Internal Medicine

## 2020-08-25 ENCOUNTER — Telehealth: Payer: Self-pay | Admitting: Dietician

## 2020-08-25 NOTE — Telephone Encounter (Signed)
Patient called.  Phone visit 3/3 was missed by provider or patient. He was last seen by this RD 06/11/2020 for CKD stage 3. He is following his diet closely (low sodium, low protein, low phos). He states that his weight has decreased to 156 lbs which he states is due to the addition of a diuretic. Answered questions related to bread choice (he is eating [employer] bread), juice (recommended cranberry juice rather than orange juice).  Patient has no further questions at this time but will call as needed.  Antonieta Iba, RD, LDN, CDCES

## 2020-08-26 ENCOUNTER — Other Ambulatory Visit (HOSPITAL_BASED_OUTPATIENT_CLINIC_OR_DEPARTMENT_OTHER): Payer: Self-pay

## 2020-08-27 ENCOUNTER — Ambulatory Visit: Payer: Self-pay

## 2020-09-05 ENCOUNTER — Encounter (HOSPITAL_BASED_OUTPATIENT_CLINIC_OR_DEPARTMENT_OTHER): Payer: Medicare Other | Admitting: Internal Medicine

## 2020-10-13 ENCOUNTER — Other Ambulatory Visit: Payer: Self-pay

## 2020-10-13 ENCOUNTER — Encounter (INDEPENDENT_AMBULATORY_CARE_PROVIDER_SITE_OTHER): Payer: Self-pay | Admitting: Ophthalmology

## 2020-10-13 ENCOUNTER — Ambulatory Visit (INDEPENDENT_AMBULATORY_CARE_PROVIDER_SITE_OTHER): Payer: Medicare Other | Admitting: Ophthalmology

## 2020-10-13 DIAGNOSIS — H35071 Retinal telangiectasis, right eye: Secondary | ICD-10-CM

## 2020-10-13 DIAGNOSIS — H35072 Retinal telangiectasis, left eye: Secondary | ICD-10-CM

## 2020-10-13 NOTE — Assessment & Plan Note (Signed)
No active CME 

## 2020-10-13 NOTE — Progress Notes (Signed)
10/13/2020     CHIEF COMPLAINT Patient presents for Retina Follow Up (6 Mo F/U OU//Pt c/o decreased VA slightly over time OD. Pt sts he is having a hard time reading on the computer OD. Pt sts VA OS stable. Pt sts he has not been using CPAP x 3-4 weeks due to rosacea on face.)   HISTORY OF PRESENT ILLNESS: Zachary Padilla is a 87 y.o. male who presents to the clinic today for:   HPI    Retina Follow Up    Diagnosis: Other   Laterality: both eyes   Onset: 6 months ago   Severity: mild   Duration: 6 months   Course: stable   Comments: 6 Mo F/U OU  Pt c/o decreased VA slightly over time OD. Pt sts he is having a hard time reading on the computer OD. Pt sts VA OS stable. Pt sts he has not been using CPAP x 3-4 weeks due to rosacea on face.       Last edited by Rockie Neighbours, [place] on 10/13/2020 11:08 AM. (History)      Referring physician: Lajean Manes, MD [address],  [place] [address]  HISTORICAL INFORMATION:   Selected notes from the [place]: No current outpatient medications on file. (Ophthalmic Drugs)   No current facility-administered medications for this visit. (Ophthalmic Drugs)   Current Outpatient Medications (Other)  Medication Sig  . Azelaic Acid 15 % cream Apply 1 application topically 2 (two) times daily as needed. For Roscacea  . cholecalciferol (VITAMIN D3) 25 MCG (1000 UNIT) tablet Take 1,000 Units by mouth daily.  Marland Kitchen COZAAR 50 MG tablet Take 0.5 tablets (25 mg total) by mouth daily.  . furosemide (LASIX) 20 MG tablet Take 20 mg by mouth daily.  Marland Kitchen guaiFENesin (MUCINEX) 600 MG 12 hr tablet Take by mouth as needed.  . hydrocortisone 2.5 % ointment Apply topically 2 (two) times daily.  Marland Kitchen levETIRAcetam (KEPPRA) 500 MG tablet TAKE ONE-HALF TABLET IN THE MORNING AND TAKE ONE TABLET IN THE EVENING  . mupirocin ointment (BACTROBAN) 2 % Apply to inside of each side of nose 2 times / day; squeeze nasal  tip to help disperse medication after applying   No current facility-administered medications for this visit. (Other)      REVIEW OF SYSTEMS:    ALLERGIES Allergies  Allergen Reactions  . Ace Inhibitors Other (See Comments)    unknown  . Aminoglycosides   . Erythromycin Nausea And Vomiting  . Levofloxacin Other (See Comments)    dehydration  . Penicillins Nausea And Vomiting  . Sulfa Antibiotics   . Ciprofloxacin Rash    hives    PAST MEDICAL HISTORY Past Medical History:  Diagnosis Date  . 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 ALLERGIC RHINITIS 02/23/2009  . Basal cell carcinoma 06/23/2010   R cheek- (CX35FU+ exc )  . COLONIC POLYPS, HX OF 02/23/2009  . Convulsions/seizures ([place]) 10/24/2014  . DIVERTICULITIS, HX OF 02/23/2009  . Ear infection   . Family history of heart disease   . GERD 02/23/2009  . Hyperlipidemia   . HYPERTENSION 02/23/2009  . PONV (postoperative nausea and vomiting)   . Pre-syncope   . Thoracic aortic aneurysm ([place])   . URINARY INCONTINENCE 02/23/2009   Past Surgical History:  Procedure Laterality Date  . APPENDECTOMY  1946  . CATARACT EXTRACTION Bilateral   . EYE SURGERY Right 07/25/2019  . Left ear tube  2001  . Removal of pre-cancer bump on neck  2005  . Ruptured appendics  1946  . Sinus surgery  2008    FAMILY HISTORY Family History  Problem Relation Age of Onset  . Heart disease Mother   . Stroke Father   . Heart disease Father   . Cancer Sister   . Cancer Brother   . Heart disease Other   . Stroke Other     SOCIAL HISTORY Social History   Tobacco Use  . Smoking status: Former Research scientist (life sciences)  . Smokeless tobacco: Never Used  . Tobacco comment: Lives with spouse-independant in all ADLs. [employer] and [employer] at [employer], Married in "52"  Substance Use Topics  . Alcohol use: No  . Drug use: No         OPHTHALMIC EXAM: Base Eye Exam    Visual Acuity (ETDRS)      Right Left   Dist cc 20/25 +2 20/20 -1    Correction: Glasses       Tonometry (Tonopen, 11:12 AM)      Right Left   Pressure 15 15       Pupils      Pupils Dark Light Shape React APD   Right PERRL 5 4 Round Slow None   Left PERRL 5 4 Round Slow None       Visual Fields (Counting fingers)      Left Right    Full Full       Extraocular Movement      Right Left    Full Full       Neuro/Psych    Oriented x3: Yes   Mood/Affect: Normal       Dilation    Both eyes: 1.0% Mydriacyl, 2.5% Phenylephrine @ 11:12 AM        Slit Lamp and Fundus Exam    External Exam      Right Left   External Normal Normal       Slit Lamp Exam      Right Left   Lids/Lashes Normal Normal   Conjunctiva/Sclera Gianino and quiet Vandevender and quiet   Cornea  , Debris in tear film completely resolved, epithelium and cornea are now clear Clear   Anterior Chamber Deep and quiet Deep and quiet   Iris Round and reactive Round and reactive   Lens Clear Clear   Anterior Vitreous Normal Normal       Fundus Exam      Right Left   Posterior Vitreous Vitrectomized Normal   Disc Normal Normal   C/D Ratio 0.5 0.7   Macula No foveal topographic distortion Microaneurysms adjacent to the fovea, temp and nas no exudate, no thickening   Vessels Normal Normal   Periphery Normal Normal          IMAGING AND PROCEDURES  Imaging and Procedures for 10/13/20  OCT, Retina - OU - Both Eyes       Right Eye Quality was good. Scan locations included subfoveal. Central Foveal Thickness: 317. Progression has been stable. Findings include epiretinal membrane.   Left Eye Quality was good. Scan locations included subfoveal. Central Foveal Thickness: 265. Progression has been stable.   Notes Minor remnant nasal, no active foveal distortion OD  OS with slight straightening and foveal elevation, no clear umbo, minor epiretinal membrane but this has been stable for years   No active maculopathy currently in either eye.  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  2.  3.  Ophthalmic Meds Ordered this visit:  No orders of the defined types were placed in this encounter.      Return in about 6 months (around 04/14/2021) for DILATE OU, OCT.  There are no Patient Instructions on file for this visit.   Explained the diagnoses, plan, and follow up with the patient and they expressed understanding.  Patient expressed understanding of the importance of proper follow up care.   Clent Demark Sofie Schendel M.D. Diseases & Surgery of the Retina and Vitreous Retina & Diabetic Finger 10/13/20     Abbreviations: M myopia (nearsighted); A astigmatism; H hyperopia (farsighted); P presbyopia; Mrx spectacle prescription;  CTL contact lenses; OD right eye; OS left eye; OU both eyes  XT exotropia; ET esotropia; PEK punctate epithelial keratitis; PEE punctate epithelial erosions; DES dry eye syndrome; MGD meibomian gland dysfunction; ATs artificial tears; PFAT's preservative free artificial tears; [place] nuclear sclerotic cataract; PSC posterior subcapsular cataract; ERM epi-retinal membrane; PVD posterior vitreous detachment; RD retinal detachment; DM diabetes mellitus; DR diabetic retinopathy; NPDR non-proliferative diabetic retinopathy; PDR proliferative diabetic retinopathy; CSME clinically significant macular edema; DME diabetic macular edema; dbh dot blot hemorrhages; CWS cotton wool spot; POAG primary open angle glaucoma; C/D  cup-to-disc ratio; HVF humphrey visual field; GVF goldmann visual field; OCT optical coherence tomography; IOP intraocular pressure; BRVO Branch retinal vein occlusion; CRVO central retinal vein occlusion; CRAO central retinal artery occlusion; BRAO branch retinal artery occlusion; RT retinal tear; SB scleral buckle; PPV pars plana vitrectomy; VH Vitreous hemorrhage; PRP panretinal laser photocoagulation; IVK intravitreal kenalog; VMT vitreomacular traction; MH Macular hole;  NVD neovascularization of the disc; NVE neovascularization elsewhere; AREDS age related eye disease study; ARMD age related macular degeneration; POAG primary open angle glaucoma; EBMD epithelial/anterior basement membrane dystrophy; ACIOL anterior chamber intraocular lens; IOL intraocular lens; PCIOL posterior chamber intraocular lens; Phaco/IOL phacoemulsification with intraocular lens placement; [place] photorefractive keratectomy; LASIK laser assisted in situ keratomileusis; HTN hypertension; DM diabetes mellitus; COPD chronic obstructive pulmonary disease

## 2020-10-13 NOTE — Assessment & Plan Note (Signed)
Stable at present, off CPAP now for 1 month Forehead skin condition (psoriasis) documented in the past no active leakage by OCT

## 2020-10-16 ENCOUNTER — Other Ambulatory Visit: Payer: Self-pay | Admitting: Adult Health

## 2021-01-26 ENCOUNTER — Telehealth: Payer: Self-pay | Admitting: Cardiovascular Disease

## 2021-01-26 DIAGNOSIS — I712 Thoracic aortic aneurysm, without rupture, unspecified: Secondary | ICD-10-CM

## 2021-01-26 DIAGNOSIS — I10 Essential (primary) hypertension: Secondary | ICD-10-CM

## 2021-01-26 NOTE — Telephone Encounter (Signed)
Contacted patient- he thought he was suppose to be having a CT but states he was unable to do this because of his kidney function- I advised that the last scan for this was done in 2017, and they have been monitoring his TAA by ECHO- last one was in February.  It was asked to repeat as needed, patient states he would like to have another one before his appointment with Dr.Berry in February to keep an eye on things. I advised I would route a message to see if okay to get ordered so it can be scheduled for his upcoming appointment.   Patient thankful for call back-

## 2021-01-26 NOTE — Telephone Encounter (Signed)
Patient said he normally gets a CT every year before his appointment with Dr. Gwenlyn Found. The patient wanted to know if he still needs to have that done again. He can not use the contrast dye any longer.   Please let the patient know what Dr. Gwenlyn Found recommends

## 2021-02-03 NOTE — Telephone Encounter (Signed)
Spoke with pt regarding ECHO. Pt would like to have this done prior to OV in February with Dr. Gwenlyn Found. Ok per Dr. Gwenlyn Found. Orders placed. Sent to scheduling. Pt confirms date/time of February OV. Pt verbalizes understanding.

## 2021-02-18 ENCOUNTER — Ambulatory Visit: Payer: Medicare Other | Admitting: Adult Health

## 2021-02-22 ENCOUNTER — Ambulatory Visit (INDEPENDENT_AMBULATORY_CARE_PROVIDER_SITE_OTHER): Payer: Medicare Other | Admitting: Adult Health

## 2021-02-22 ENCOUNTER — Encounter: Payer: Self-pay | Admitting: Adult Health

## 2021-02-22 ENCOUNTER — Other Ambulatory Visit: Payer: Self-pay

## 2021-02-22 VITALS — BP 119/68 | HR 72 | Ht 69.0 in | Wt 158.0 lb

## 2021-02-22 DIAGNOSIS — R569 Unspecified convulsions: Secondary | ICD-10-CM

## 2021-02-22 NOTE — Progress Notes (Signed)
PATIENT: Zachary Padilla DOB: 01-13-1929  REASON FOR VISIT: follow up HISTORY FROM: patient   HISTORY OF PRESENT ILLNESS: Today 02/22/21 Zachary Padilla is a 87 y.o. male with a history of seizures, he is here today for a follow up. He denies and seizure events. He denies excess fatigue or drowsiness. Denies any falls or changes in his memory. He continues to operate a motor vehicle and he is living at home with wife.   He denies any issues or concerns at this time. He is having issues with his kidneys and reports he is stage 3 CKD. He is having difficulties finding foods that are appropriate for this. His concern is if the Keppra effects his kidneys. We informed that patient that this medication is ok at the current dose. He is seeing a urologist.   HISTORY  02/19/20: Zachary Padilla is an 87 year old male with a history of seizures.  He returns today for follow-up.  He denies any seizure events.  Continues on Keppra 250 mg in the morning and 500 in the evening.  Does report that he has stage III kidney disease.  Overall he feels that he is doing well.  Returns today for evaluation.   08/20/19: Zachary Padilla is an 87 year old male with a history of seizures.  He returns today for follow-up.  He denies any seizure events.  Continues on Keppra 250 mg in the morning and 500 mg in the evening.  He denies any further changes with his memory.  He is able to complete all ADLs independently.  He operates a Teacher, music.  He returns today for an evaluation.    REVIEW OF SYSTEMS: Out of a complete 14 system review of symptoms, the patient complains only of the following symptoms, and all other reviewed systems are negative.  ALLERGIES: Allergies  Allergen Reactions   Ace Inhibitors Other (See Comments)    unknown   Aminoglycosides    Erythromycin Nausea And Vomiting   Levofloxacin Other (See Comments)    dehydration   Penicillins Nausea And Vomiting   Sulfa Antibiotics    Ciprofloxacin Rash    hives     HOME MEDICATIONS: Outpatient Medications Prior to Visit  Medication Sig Dispense Refill   Azelaic Acid 15 % cream Apply 1 application topically 2 (two) times daily as needed. For Roscacea     cholecalciferol (VITAMIN D3) 25 MCG (1000 UNIT) tablet Take 1,000 Units by mouth daily.     COZAAR 50 MG tablet Take 0.5 tablets (25 mg total) by mouth daily. 45 tablet 3   furosemide (LASIX) 20 MG tablet Take 20 mg by mouth daily.     guaiFENesin (MUCINEX) 600 MG 12 hr tablet Take by mouth as needed.     hydrocortisone 2.5 % ointment Apply topically 2 (two) times daily. 30 g 2   levETIRAcetam (KEPPRA) 500 MG tablet TAKE ONE-HALF TABLET IN THE MORNING AND TAKE ONE TABLET IN THE EVENING 135 tablet 3   mupirocin ointment (BACTROBAN) 2 % Apply to inside of each side of nose 2 times / day; squeeze nasal tip to help disperse medication after applying     No facility-administered medications prior to visit.    PAST MEDICAL HISTORY: Past Medical History:  Diagnosis Date   ACNE ROSACEA 09/09/2009   ALLERGIC RHINITIS 02/23/2009   Basal cell carcinoma 06/23/2010   R cheek- (CX35FU+ exc )   COLONIC POLYPS, HX OF 02/23/2009   Convulsions/seizures ([place]) 10/24/2014   DIVERTICULITIS, HX OF 02/23/2009  Ear infection    Family history of heart disease    GERD 02/23/2009   Hyperlipidemia    HYPERTENSION 02/23/2009   PONV (postoperative nausea and vomiting)    Pre-syncope    Thoracic aortic aneurysm ([place])    URINARY INCONTINENCE 02/23/2009    PAST SURGICAL HISTORY: Past Surgical History:  Procedure Laterality Date   APPENDECTOMY  1946   CATARACT EXTRACTION Bilateral    EYE SURGERY Right 07/25/2019   Left ear tube  2001   Removal of pre-cancer bump on neck  2005   Ruptured appendics  1946   Sinus surgery  2008    FAMILY HISTORY: Family History  Problem Relation Age of Onset   Heart disease Mother    Stroke Father    Heart disease Father    Cancer Sister    Cancer Brother    Heart disease  Other    Stroke Other     SOCIAL HISTORY: Social History   Socioeconomic History   Marital status: Married    Spouse name: Not on file   Number of children: 2   Years of education: 15   Highest education level: Not on file  Occupational History   Occupation: retired  Tobacco Use   Smoking status: Former   Smokeless tobacco: Never   Tobacco comments:    Lives with spouse-independant in all ADLs. [employer] and [employer] at [employer], Married in "52"  Substance and Sexual Activity   Alcohol use: No   Drug use: No   Sexual activity: Yes  Other Topics Concern   Not on file  Social History Narrative   Patient occasionally drinks tea.   Patient is right handed.   Social Determinants of Health   Financial Resource Strain: Not on file  Food Insecurity: Not on file  Transportation Needs: Not on file  Physical Activity: Not on file  Stress: Not on file  Social Connections: Not on file  Intimate Partner Violence: Not on file     PHYSICAL EXAM  Vitals:   02/22/21 1529  BP: 119/68  Pulse: 72  Weight: 158 lb (71.7 kg)  Height: 5\' 9"  (1.753 m)   Body mass index is 23.33 kg/m.  Generalized: Well developed, in no acute distress   Neurological examination  Mentation: Alert oriented to time, place, history taking. Follows all commands speech and language fluent Cranial nerve II-XII: Pupils were equal round reactive to light. Extraocular movements were full, visual field were full on confrontational test. Facial sensation and strength were normal. Uvula tongue midline. Head turning and shoulder shrug  were normal and symmetric. Motor: The motor testing reveals 5 over 5 strength of all 4 extremities. Good symmetric motor tone is noted throughout.  Sensory: Sensory testing is intact to soft touch on all 4 extremities. No evidence of extinction is noted.  Coordination: Cerebellar testing reveals good finger-nose-finger and heel-to-shin bilaterally.  Gait and  station: Gait is normal. Tandem gait is normal. Romberg is negative. No drift is seen.  Reflexes: Deep tendon reflexes are symmetric and normal bilaterally.   DIAGNOSTIC DATA (LABS, IMAGING, TESTING) - I reviewed patient records, labs, notes, testing and imaging myself where available.  Lab Results  Component Value Date   WBC 5.5 03/22/2016   HGB 14.3 03/22/2016   HCT 41.9 03/22/2016   MCV 96.5 03/22/2016   PLT 160 03/22/2016      Component Value Date/Time   NA 134 (L) 03/22/2016 1128   K 4.7 03/22/2016 1128  CL 101 03/22/2016 1128   CO2 27 03/22/2016 1128   GLUCOSE 80 03/22/2016 1128   BUN 17 03/22/2016 1128   CREATININE 1.27 (H) 03/22/2016 1128   CALCIUM 8.7 03/22/2016 1128   PROT 6.6 03/22/2016 1128   ALBUMIN 3.9 03/22/2016 1128   AST 22 03/22/2016 1128   ALT 15 03/22/2016 1128   ALKPHOS 73 03/22/2016 1128   BILITOT 1.0 03/22/2016 1128   GFRNONAA 51 (L) 03/22/2016 1128   GFRAA 59 (L) 03/22/2016 1128   Lab Results  Component Value Date   CHOL 123 03/22/2016   HDL 50 03/22/2016   LDLCALC 63 03/22/2016   TRIG 52 03/22/2016   CHOLHDL 2.5 03/22/2016   No results found for: HGBA1C No results found for: VITAMINB12 Lab Results  Component Value Date   TSH 1.16 03/22/2016     ASSESSMENT AND PLAN 87 y.o. year old male  has a past medical history of ACNE ROSACEA (09/09/2009), ALLERGIC RHINITIS (02/23/2009), Basal cell carcinoma (06/23/2010), COLONIC POLYPS, HX OF (02/23/2009), Convulsions/seizures ([place]) (10/24/2014), DIVERTICULITIS, HX OF (02/23/2009), Ear infection, Family history of heart disease, GERD (02/23/2009), Hyperlipidemia, HYPERTENSION (02/23/2009), PONV (postoperative nausea and vomiting), Pre-syncope, Thoracic aortic aneurysm ([place]), and URINARY INCONTINENCE (02/23/2009). here with   Convulsions; unspecified type  -- Continue Keppra 250 mg in the morning and 500 mg in the evening. -- FU in 6 months or sooner if needed    Ward Givens, MSN, NP-C  02/22/2021, 3:16 PM [facility] [address], [place], [place] [address] [phone]

## 2021-04-20 ENCOUNTER — Other Ambulatory Visit: Payer: Self-pay

## 2021-04-20 ENCOUNTER — Ambulatory Visit (INDEPENDENT_AMBULATORY_CARE_PROVIDER_SITE_OTHER): Payer: Medicare Other | Admitting: Ophthalmology

## 2021-04-20 ENCOUNTER — Encounter (INDEPENDENT_AMBULATORY_CARE_PROVIDER_SITE_OTHER): Payer: Self-pay | Admitting: Ophthalmology

## 2021-04-20 DIAGNOSIS — H35071 Retinal telangiectasis, right eye: Secondary | ICD-10-CM | POA: Diagnosis not present

## 2021-04-20 DIAGNOSIS — H35072 Retinal telangiectasis, left eye: Secondary | ICD-10-CM

## 2021-04-20 DIAGNOSIS — H35371 Puckering of macula, right eye: Secondary | ICD-10-CM

## 2021-04-20 DIAGNOSIS — G4733 Obstructive sleep apnea (adult) (pediatric): Secondary | ICD-10-CM

## 2021-04-20 DIAGNOSIS — H35372 Puckering of macula, left eye: Secondary | ICD-10-CM

## 2021-04-20 DIAGNOSIS — H35352 Cystoid macular degeneration, left eye: Secondary | ICD-10-CM

## 2021-04-20 NOTE — Assessment & Plan Note (Signed)
Date of examination 04-20-2021.  Patient has documented OSA, but has not been using his mask because of mask fit/skin issues over the last 5 to 6 weeks.

## 2021-04-20 NOTE — Assessment & Plan Note (Signed)
No active CME OD by OCT yet there are tiny micro aneurysmal changes now present on the superior aspect of the macula

## 2021-04-20 NOTE — Progress Notes (Signed)
04/20/2021     CHIEF COMPLAINT Patient presents for  Chief Complaint  Patient presents with   Retina Evaluation      HISTORY OF PRESENT ILLNESS: Zachary Padilla is a 87 y.o. male who presents to the clinic today for:   HPI     Retina Evaluation           Laterality: both eyes   MD Performed: performed the HPI with the patient and updated documentation appropriately         Comments   History of bilateral MAC-TEL, retinal telangiectasis and epiretinal membrane.  Over the ensuing 6 months no change in visual acuity, except for some vague sense that things may not be quite as clear  No medical history changes Patient has documented OSA, but has not been using his mask because of mask fit/skin issues over the last 5 to 6 weeks.        Last edited by Hurman Horn, MD on 04/20/2021 11:23 AM.      Referring physician: Lajean Manes, [address] [place],  [place] [id number]  HISTORICAL INFORMATION:   Selected notes from the [facility]: No current outpatient medications on file. (Ophthalmic Drugs)   No current facility-administered medications for this visit. (Ophthalmic Drugs)   Current Outpatient Medications (Other)  Medication Sig   Azelaic Acid 15 % cream Apply 1 application topically 2 (two) times daily as needed. For Roscacea   cholecalciferol (VITAMIN D3) 25 MCG (1000 UNIT) tablet Take 1,000 Units by mouth daily.   COZAAR 50 MG tablet Take 0.5 tablets (25 mg total) by mouth daily. (Patient taking differently: Take 20 mg by mouth daily.)   furosemide (LASIX) 20 MG tablet Take 20 mg by mouth daily.   guaiFENesin (MUCINEX) 600 MG 12 hr tablet Take by mouth as needed.   hydrocortisone 2.5 % ointment Apply topically 2 (two) times daily. (Patient taking differently: Apply topically as needed.)   levETIRAcetam (KEPPRA) 500 MG tablet TAKE ONE-HALF TABLET IN THE MORNING AND TAKE ONE TABLET IN THE EVENING    mupirocin ointment (BACTROBAN) 2 % as needed.   No current facility-administered medications for this visit. (Other)      REVIEW OF SYSTEMS: ROS   Negative for: Constitutional, Gastrointestinal, Neurological, Skin, Genitourinary, Musculoskeletal, HENT, Endocrine, Cardiovascular, Eyes, Respiratory, Psychiatric, Allergic/Imm, Heme/Lymph Last edited by Hurman Horn, MD on 04/20/2021 11:13 AM.       ALLERGIES Allergies  Allergen Reactions   Ace Inhibitors Other (See Comments)    unknown   Aminoglycosides    Erythromycin Nausea And Vomiting   Levofloxacin Other (See Comments)    dehydration   Penicillins Nausea And Vomiting   Sulfa Antibiotics    Ciprofloxacin Rash    hives    PAST MEDICAL HISTORY Past Medical History:  Diagnosis Date   ACNE ROSACEA 09/09/2009   ALLERGIC RHINITIS 02/23/2009   Basal cell carcinoma 06/23/2010   R cheek- (CX35FU+ exc )   COLONIC POLYPS, HX OF 02/23/2009   Convulsions/seizures ([place]) 10/24/2014   DIVERTICULITIS, HX OF 02/23/2009   Ear infection    Family history of heart disease    GERD 02/23/2009   Hyperlipidemia    HYPERTENSION 02/23/2009   PONV (postoperative nausea and vomiting)    Pre-syncope    Thoracic aortic aneurysm    URINARY INCONTINENCE 02/23/2009   Past Surgical History:  Procedure Laterality Date   APPENDECTOMY  1946  CATARACT EXTRACTION Bilateral    EYE SURGERY Right 07/25/2019   Left ear tube  2001   Removal of pre-cancer bump on neck  2005   Ruptured appendics  1946   Sinus surgery  2008    FAMILY HISTORY Family History  Problem Relation Age of Onset   Heart disease Mother    Stroke Father    Heart disease Father    Cancer Sister    Cancer Brother    Heart disease Other    Stroke Other    Seizures Neg Hx     SOCIAL HISTORY Social History   Tobacco Use   Smoking status: Former   Smokeless tobacco: Never   Tobacco comments:    Lives with spouse-independant in all ADLs. [employer] and [employer] at  [employer], Married in "52"  Vaping Use   Vaping Use: Never used  Substance Use Topics   Alcohol use: No   Drug use: No         OPHTHALMIC EXAM:  Base Eye Exam     Visual Acuity (ETDRS)       Right Left   Dist cc 20/30 +2 20/15    Correction: Glasses         Tonometry (Tonopen, 11:11 AM)       Right Left   Pressure 13 13         Pupils       Pupils APD   Right PERRL None   Left PERRL None         Visual Fields       Left Right    Full Full         Neuro/Psych     Oriented x3: Yes   Mood/Affect: Normal         Dilation     Both eyes: 1.0% Mydriacyl, 2.5% Phenylephrine @ 11:11 AM           Slit Lamp and Fundus Exam     External Exam       Right Left   External Normal Normal         Slit Lamp Exam       Right Left   Lids/Lashes Normal Normal   Conjunctiva/Sclera Gloor and quiet Episcopo and quiet   Cornea  , Debris in tear film completely resolved, epithelium and cornea are now clear Clear   Anterior Chamber Deep and quiet Deep and quiet   Iris Round and reactive Round and reactive   Lens Clear Clear   Anterior Vitreous Normal Normal         Fundus Exam       Right Left   Posterior Vitreous Vitrectomized Normal   Disc Normal Normal   C/D Ratio 0.5 0.7   Macula No foveal topographic distortion Microaneurysms adjacent to the fovea, temp and nas no exudate, no thickening   Vessels Normal Normal   Periphery Normal Normal            IMAGING AND PROCEDURES  Imaging and Procedures for 04/20/21  OCT, Retina - OU - Both Eyes       Right Eye Quality was good. Scan locations included subfoveal. Central Foveal Thickness: 311. Progression has been stable. Findings include epiretinal membrane.   Left Eye Quality was good. Scan locations included subfoveal. Central Foveal Thickness: 370. Progression has been stable.   Notes Minor remnant nasal, no active foveal distortion OD  OS with slight  straightening and foveal elevation, no clear umbo, minor  epiretinal membrane but this has been stable for years   No active maculopathy currently in either eye.  We will continue to observe  Patient notably however is off of CPAP for the last month or so due to a skin condition             ASSESSMENT/PLAN:  Sleep apnea, obstructive Date of examination 04-20-2021.  Patient has documented OSA, but has not been using his mask because of mask fit/skin issues over the last 5 to 6 weeks.  Cystoid macular edema of left eye Will suggest a fluorescein angiography left right eye today  OS likely secondary to recent noncompliance with CPAP use due to mask fit and skin issues  Patient given tips on how to remedy or deal with this issue.  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 Retinal telangiectasia of right eye  H35.071 OCT, Retina - OU - Both Eyes    3. Macular pucker, left eye  H35.372 OCT, Retina - OU - Both Eyes    4. Right epiretinal membrane  H35.371 OCT, Retina - OU - Both Eyes    5. Sleep apnea, obstructive  G47.33     6. Cystoid macular edema of left eye  H35.352       1.  OS with the first sign of CME by OCT, this suggesting active MAC-TEL has returned.  Patient has in fact been noncompliant with CPAP use for the last  5 to 6 weeks.  So we will therefore reencourage the restoration of treatment with CPAP and then monitor for how quickly the CME can  resolve  2.  3.  Ophthalmic Meds Ordered this visit:  No orders of the defined types were placed in this encounter.      Return in about 3 weeks (around 05/11/2021) for OCT, NO DILATE.  There are no Patient Instructions on file for this visit.   Explained the diagnoses, plan, and follow up with the patient and they expressed understanding.  Patient expressed understanding of the importance of proper follow up care.   Clent Demark Jahmir Salo M.D. Diseases & Surgery of the Retina and Vitreous [facility] 04/20/21     Abbreviations: M myopia (nearsighted); A astigmatism; H hyperopia (farsighted); P presbyopia; Mrx spectacle prescription;  CTL contact lenses; OD right eye; OS left eye; OU both eyes  XT exotropia; ET esotropia; PEK punctate epithelial keratitis; PEE punctate epithelial erosions; DES dry eye syndrome; MGD meibomian gland dysfunction; ATs artificial tears; PFAT's preservative free artificial tears; [place] nuclear sclerotic cataract; PSC posterior subcapsular cataract; ERM epi-retinal membrane; PVD posterior vitreous detachment; RD retinal detachment; DM diabetes mellitus; DR diabetic retinopathy; NPDR non-proliferative diabetic retinopathy; PDR proliferative diabetic retinopathy; CSME clinically significant macular edema; DME diabetic macular edema; dbh dot blot hemorrhages; CWS cotton wool spot; POAG primary open angle glaucoma; C/D cup-to-disc ratio; HVF humphrey visual field; GVF goldmann visual field; OCT  optical coherence tomography; IOP intraocular pressure; BRVO Branch retinal vein occlusion; CRVO central retinal vein occlusion; CRAO central retinal artery occlusion; BRAO branch retinal artery occlusion; RT retinal tear; SB scleral buckle; PPV pars plana vitrectomy; VH Vitreous hemorrhage; PRP panretinal laser photocoagulation; IVK intravitreal kenalog; VMT vitreomacular traction; MH Macular hole;  NVD neovascularization of the disc; NVE neovascularization elsewhere;  AREDS age related eye disease study; ARMD age related macular degeneration; POAG primary open angle glaucoma; EBMD epithelial/anterior basement membrane dystrophy; ACIOL anterior chamber intraocular lens; IOL intraocular lens; PCIOL posterior chamber intraocular lens; Phaco/IOL phacoemulsification with intraocular lens placement; [place] photorefractive keratectomy; LASIK laser assisted in situ keratomileusis; HTN hypertension; DM diabetes mellitus; COPD chronic obstructive pulmonary disease

## 2021-04-20 NOTE — Assessment & Plan Note (Addendum)
Will suggest a fluorescein angiography left right eye today  OS likely secondary to recent noncompliance with CPAP use due to mask fit and skin issues  Patient given tips on how to remedy or deal with this issue.

## 2021-04-20 NOTE — Assessment & Plan Note (Signed)
Condition made worse by being off of CPAP now for some 5 to 6 weeks now with new onset CME in the temporal aspect of the macula by OCT OS and confirmed by fluorescein angiography with leakage now temporally as well as superior portion of macula

## 2021-05-11 ENCOUNTER — Ambulatory Visit (INDEPENDENT_AMBULATORY_CARE_PROVIDER_SITE_OTHER): Payer: Medicare Other | Admitting: Ophthalmology

## 2021-05-11 ENCOUNTER — Other Ambulatory Visit: Payer: Self-pay

## 2021-05-11 ENCOUNTER — Encounter (INDEPENDENT_AMBULATORY_CARE_PROVIDER_SITE_OTHER): Payer: Medicare Other | Admitting: Ophthalmology

## 2021-05-11 ENCOUNTER — Encounter (INDEPENDENT_AMBULATORY_CARE_PROVIDER_SITE_OTHER): Payer: Self-pay | Admitting: Ophthalmology

## 2021-05-11 DIAGNOSIS — H35072 Retinal telangiectasis, left eye: Secondary | ICD-10-CM

## 2021-05-11 DIAGNOSIS — H35071 Retinal telangiectasis, right eye: Secondary | ICD-10-CM

## 2021-05-11 NOTE — Assessment & Plan Note (Signed)
Mild thickening improvement over the last 3 weeks now that patient has resume CPAP use.

## 2021-05-11 NOTE — Assessment & Plan Note (Signed)
OS, patient has resumed CPAP use for the last 3 weeks since previous examination, with no interval change in acuity, no interval change in acuity nor on OCT these 3 weeks

## 2021-05-11 NOTE — Progress Notes (Signed)
05/11/2021     CHIEF COMPLAINT Patient presents for  Chief Complaint  Patient presents with   Retina Follow Up      HISTORY OF PRESENT ILLNESS: Zachary Padilla is a 88 y.o. male who presents to the clinic today for:   HPI     Retina Follow Up           Diagnosis: Other   Laterality: left eye   Onset: 3   Duration: 3   Course: stable         Comments   3 week fu OU oct, no dilation.  Follow-up of type II MAC-TEL OU.  This is a follow-up of resumption of CPAP use over the last 3 weeks after patient had a 5-week spell of no CPAP use and developed perifoveal CME temporally OS by OCT evaluation.  This is an attempt to see if there is any new or worsening Patient states vision is stable and unchanged since last visit. Denies any new floaters or FOL. Ciprofloxacin allergy.      Last edited by Hurman Horn, MD on 05/11/2021 11:05 AM.      Referring physician: Lajean Manes, [address]. [place],  [place] [address]  HISTORICAL INFORMATION:   Selected notes from the [facility]: No current outpatient medications on file. (Ophthalmic Drugs)   No current facility-administered medications for this visit. (Ophthalmic Drugs)   Current Outpatient Medications (Other)  Medication Sig   Azelaic Acid 15 % cream Apply 1 application topically 2 (two) times daily as needed. For Roscacea   cholecalciferol (VITAMIN D3) 25 MCG (1000 UNIT) tablet Take 1,000 Units by mouth daily.   COZAAR 50 MG tablet Take 0.5 tablets (25 mg total) by mouth daily. (Patient taking differently: Take 20 mg by mouth daily.)   furosemide (LASIX) 20 MG tablet Take 20 mg by mouth daily.   guaiFENesin (MUCINEX) 600 MG 12 hr tablet Take by mouth as needed.   hydrocortisone 2.5 % ointment Apply topically 2 (two) times daily. (Patient taking differently: Apply topically as needed.)   levETIRAcetam (KEPPRA) 500 MG tablet TAKE ONE-HALF TABLET IN THE MORNING  AND TAKE ONE TABLET IN THE EVENING   mupirocin ointment (BACTROBAN) 2 % as needed.   No current facility-administered medications for this visit. (Other)      REVIEW OF SYSTEMS:    ALLERGIES Allergies  Allergen Reactions   Ace Inhibitors Other (See Comments)    unknown   Aminoglycosides    Erythromycin Nausea And Vomiting   Levofloxacin Other (See Comments)    dehydration   Penicillins Nausea And Vomiting   Sulfa Antibiotics    Ciprofloxacin Rash    hives    PAST MEDICAL HISTORY Past Medical History:  Diagnosis Date   ACNE ROSACEA 09/09/2009   ALLERGIC RHINITIS 02/23/2009   Basal cell carcinoma 06/23/2010   R cheek- (CX35FU+ exc )   COLONIC POLYPS, HX OF 02/23/2009   Convulsions/seizures ([place]) 10/24/2014   DIVERTICULITIS, HX OF 02/23/2009   Ear infection    Family history of heart disease    GERD 02/23/2009   Hyperlipidemia    HYPERTENSION 02/23/2009   PONV (postoperative nausea and vomiting)    Pre-syncope    Thoracic aortic aneurysm    URINARY INCONTINENCE 02/23/2009   Past Surgical History:  Procedure Laterality Date   APPENDECTOMY  1946   CATARACT EXTRACTION Bilateral    EYE SURGERY Right 07/25/2019  Left ear tube  2001   Removal of pre-cancer bump on neck  2005   Ruptured appendics  1946   Sinus surgery  2008    FAMILY HISTORY Family History  Problem Relation Age of Onset   Heart disease Mother    Stroke Father    Heart disease Father    Cancer Sister    Cancer Brother    Heart disease Other    Stroke Other    Seizures Neg Hx     SOCIAL HISTORY Social History   Tobacco Use   Smoking status: Former   Smokeless tobacco: Never   Tobacco comments:    Lives with spouse-independant in all ADLs. [employer] and [employer] at [employer], Married in "52"  Vaping Use   Vaping Use: Never used  Substance Use Topics   Alcohol use: No   Drug use: No         OPHTHALMIC EXAM:  Base Eye Exam     Visual Acuity (ETDRS)        Right Left   Dist cc 20/30 20/20    Correction: Glasses         Tonometry (Tonopen, 10:36 AM)       Right Left   Pressure 17 19         Pupils       Pupils APD   Right PERRL None   Left PERRL None         Extraocular Movement       Right Left    Full Full         Neuro/Psych     Oriented x3: Yes   Mood/Affect: Normal         Dilation     Both eyes: No dilation            Slit Lamp and Fundus Exam     External Exam       Right Left   External Normal Normal         Slit Lamp Exam       Right Left   Lids/Lashes Normal Normal   Conjunctiva/Sclera Broccoli and quiet Froning and quiet   Cornea  , Debris in tear film completely resolved, epithelium and cornea are now clear Clear   Anterior Chamber Deep and quiet Deep and quiet   Iris Round and reactive Round and reactive   Lens Clear Clear   Anterior Vitreous Normal Normal         Fundus Exam       Right Left   C/D Ratio 0.5 0.7            IMAGING AND PROCEDURES  Imaging and Procedures for 05/11/21  OCT, Retina - OU - Both Eyes       Right Eye Quality was good. Scan locations included subfoveal. Central Foveal Thickness: 303. Progression has been stable. Findings include epiretinal membrane.   Left Eye Quality was good. Scan locations included subfoveal. Central Foveal Thickness: 271. Progression has been stable. Findings include cystoid macular edema.   Notes Minor remnant nasal, no active foveal distortion OD  OS with subtle residual temporal intraretinal fluid, CME as compared to 3 weeks prior.  These were not seen on OCT evaluation June 2022.  They did develop some 6 months later potentially coincident with 5 weeks off of CPAP use.  No progression over the last 3 weeks.               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     ICD-10-CM   1. 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 Retinal telangiectasia of right eye  H35.071       1.  OS with mild perifoveal CME which did not worsen now the patient has successfully resume CPAP use.  Patient reports improved systemic side effects benefiting including improved sense of wellbeing and energy with using the CPAP, and also demonstrably left eye no worsening of intraretinal fluid, CME in the temporal aspect of the fovea.  2.  OD with minor improvement in thickening coincident with CPAP use.  This may or may not be direct result of the CPAP use.  3.  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  Ophthalmic Meds Ordered this visit:  No orders of the defined types were placed in this encounter.      Return in about 6 months (around 11/08/2021) for DILATE OU, OCT.  There are no Patient Instructions on file for this visit.   Explained the diagnoses, plan, and follow up with the patient and they expressed understanding.  Patient expressed understanding of the importance of proper follow up care.   Clent Demark Naesha Buckalew M.D. Diseases & Surgery of the Retina and Vitreous [facility] 05/11/21     Abbreviations: M myopia (nearsighted); A astigmatism; H hyperopia (farsighted); P presbyopia; Mrx spectacle prescription;  CTL contact lenses; OD right eye; OS left eye; OU both eyes  XT exotropia; ET esotropia; PEK punctate epithelial keratitis; PEE punctate epithelial erosions; DES dry eye syndrome; MGD meibomian gland dysfunction; ATs artificial tears; PFAT's preservative free artificial tears; Vici  nuclear sclerotic cataract; PSC posterior subcapsular cataract; ERM epi-retinal membrane; PVD posterior vitreous detachment; RD retinal detachment; DM diabetes mellitus; DR diabetic retinopathy; NPDR non-proliferative diabetic retinopathy; PDR proliferative diabetic retinopathy; CSME clinically significant macular edema; DME diabetic macular edema; dbh dot blot hemorrhages; CWS cotton wool spot; POAG primary open angle glaucoma; C/D cup-to-disc ratio; HVF humphrey visual field; GVF goldmann visual field; OCT optical coherence tomography; IOP intraocular pressure; BRVO Branch retinal vein occlusion; CRVO central retinal vein occlusion; CRAO central retinal artery occlusion; BRAO branch retinal artery occlusion; RT retinal tear; SB scleral buckle; PPV pars plana vitrectomy; VH Vitreous hemorrhage; PRP panretinal laser photocoagulation; IVK intravitreal kenalog; VMT vitreomacular traction; MH Macular hole;  NVD neovascularization of the disc; NVE neovascularization elsewhere; AREDS age related eye disease study; ARMD age related macular degeneration; POAG primary open angle glaucoma; EBMD epithelial/anterior basement membrane dystrophy; ACIOL anterior chamber intraocular lens; IOL intraocular lens; PCIOL posterior chamber intraocular lens; Phaco/IOL phacoemulsification with intraocular lens placement; [place] photorefractive keratectomy; LASIK laser assisted in situ keratomileusis; HTN hypertension; DM diabetes mellitus; COPD chronic obstructive pulmonary disease

## 2021-06-09 ENCOUNTER — Other Ambulatory Visit: Payer: Self-pay

## 2021-06-09 ENCOUNTER — Ambulatory Visit (HOSPITAL_COMMUNITY): Payer: Medicare Other | Attending: Cardiovascular Disease

## 2021-06-09 ENCOUNTER — Other Ambulatory Visit: Payer: Self-pay | Admitting: *Deleted

## 2021-06-09 DIAGNOSIS — I712 Thoracic aortic aneurysm, without rupture, unspecified: Secondary | ICD-10-CM | POA: Diagnosis not present

## 2021-06-09 DIAGNOSIS — I10 Essential (primary) hypertension: Secondary | ICD-10-CM | POA: Insufficient documentation

## 2021-06-09 DIAGNOSIS — I059 Rheumatic mitral valve disease, unspecified: Secondary | ICD-10-CM

## 2021-06-09 LAB — ECHOCARDIOGRAM COMPLETE
Area-P 1/2: 3.06 cm2
MV M vel: 4.23 m/s
MV Peak grad: 71.6 mmHg
P 1/2 time: 613 msec
Radius: 0.7 cm
S' Lateral: 2.9 cm

## 2021-07-06 ENCOUNTER — Encounter: Payer: Self-pay | Admitting: Cardiovascular Disease

## 2021-07-06 ENCOUNTER — Ambulatory Visit (INDEPENDENT_AMBULATORY_CARE_PROVIDER_SITE_OTHER): Payer: Medicare Other | Admitting: Cardiovascular Disease

## 2021-07-06 ENCOUNTER — Other Ambulatory Visit: Payer: Self-pay

## 2021-07-06 DIAGNOSIS — E782 Mixed hyperlipidemia: Secondary | ICD-10-CM

## 2021-07-06 DIAGNOSIS — R600 Localized edema: Secondary | ICD-10-CM

## 2021-07-06 DIAGNOSIS — I10 Essential (primary) hypertension: Secondary | ICD-10-CM

## 2021-07-06 DIAGNOSIS — I340 Nonrheumatic mitral (valve) insufficiency: Secondary | ICD-10-CM

## 2021-07-06 DIAGNOSIS — I7121 Aneurysm of the ascending aorta, without rupture: Secondary | ICD-10-CM

## 2021-07-06 DIAGNOSIS — I4510 Unspecified right bundle-branch block: Secondary | ICD-10-CM

## 2021-07-06 NOTE — Assessment & Plan Note (Signed)
History of thoracic aortic aneurysm measuring 42 mm by recent 2D echo 06/09/2021.  This will be followed on an annual basis.

## 2021-07-06 NOTE — Assessment & Plan Note (Signed)
Chronic. 

## 2021-07-06 NOTE — Assessment & Plan Note (Signed)
History of hyperlipidemia not on statin therapy followed by his PCP 

## 2021-07-06 NOTE — Assessment & Plan Note (Signed)
History of essential hypertension blood pressure measured today at 156/74.  He is no longer on Cozaar because of his moderate renal insufficiency.  He takes his blood pressure at home and it is usually in the 140/70 range.

## 2021-07-06 NOTE — Assessment & Plan Note (Signed)
Recent 2D echo performed 06/09/2021 showed moderate MR with normal LV function.  This will be followed on an annual basis.

## 2021-07-06 NOTE — Patient Instructions (Signed)
Medication Instructions:  Your physician recommends that you continue on your current medications as directed. Please refer to the Current Medication list given to you today.  *If you need a refill on your cardiac medications before your next appointment, please call your pharmacy*   Testing/Procedures: Your physician has requested that you have an echocardiogram. Echocardiography is a painless test that uses sound waves to create images of your heart. It provides your doctor with information about the size and shape of your heart and how well your hearts chambers and valves are working. This procedure takes approximately one hour. There are no restrictions for this procedure. To be done Feb 2024. This procedure will be done at [address]    Follow-Up: At [facility], you and your health needs are our priority.  As part of our continuing mission to provide you with exceptional heart care, we have created designated Provider Care Teams.  These Care Teams include your primary Cardiologist (physician) and Advanced Practice Providers (APPs -  Physician Assistants and Nurse Practitioners) who all work together to provide you with the care you need, when you need it.  We recommend signing up for the patient portal called "MyChart".  Sign up information is provided on this After Visit Summary.  MyChart is used to connect with patients for Virtual Visits (Telemedicine).  Patients are able to view lab/test results, encounter notes, upcoming appointments, etc.  Non-urgent messages can be sent to your provider as well.   To learn more about what you can do with MyChart, go to [email or web address].    Your next appointment:   3 month(s)  The format for your next appointment:   In Person  Provider:   Coletta Memos, FNP, Fabian Sharp, PA-C, Sande Rives, PA-C, Caron Presume, PA-C, Jory Sims, DNP, ANP, or Almyra Deforest, PA-C      Then, Quay Burow, MD will plan to see you  again in 12 month(s).

## 2021-07-06 NOTE — Progress Notes (Signed)
07/06/2021 [name]   November 02, 1928  [id number]  Primary Physician Lajean Manes, MD Primary Cardiologist: Lorretta Harp MD FACP, [place], [place], [place]  HPI:  Zachary Padilla is a 88 y.o.  thin and fit appearing married Caucasian male father of 2, grandfather 4 grandchildren who I saw last saw in the office 07/01/2020.Marland Kitchen He has a history of hypertension, mild hyperlipidemia and family history of heart disease as well as chronic right bundle branch block. His mother died of an MI at age 48. A Myoview stress test performed 01/19/12 was entirely normal. He denies chest pain or shortness of breath. A chest CT angiogram was performed 03/22/12 for evaluation of potential lung cancer did incidentally reveal a 4.3 cm descending thoracic aortic aneurysm which he has been asymptomatic fromthis was rechecked April of this year and was unchanged. He denies chest pain or shortness of breath. His most recent CT scan performed 11/18/15 revealed his ascending aneurysm measuring 4.3  cm at its maximum dimension, unchanged from prior studies.     Since I saw him a year ago he remained stable.    He denies chest pain or shortness of breath.  His recent 2D echo performed 06/09/2021 revealed normal LV systolic function with moderate MR and a ascending thoracic aorta measuring 42 mm which is stable.  Current Meds  Medication Sig   Azelaic Acid 15 % cream Apply 1 application topically 2 (two) times daily as needed. For Roscacea   cholecalciferol (VITAMIN D3) 25 MCG (1000 UNIT) tablet Take 1,000 Units by mouth daily.   furosemide (LASIX) 20 MG tablet Take 20 mg by mouth daily.   levETIRAcetam (KEPPRA) 500 MG tablet TAKE ONE-HALF TABLET IN THE MORNING AND TAKE ONE TABLET IN THE EVENING   sodium chloride (OCEAN) 0.65 % nasal spray 2 (two) times daily.     Allergies  Allergen Reactions   Ace Inhibitors Other (See Comments)    unknown   Aminoglycosides    Erythromycin Nausea And Vomiting   Levofloxacin Other (See  Comments)    dehydration   Penicillins Nausea And Vomiting   Sulfa Antibiotics    Ciprofloxacin Rash    hives    Social History   Socioeconomic History   Marital status: Married    Spouse name: Not on file   Number of children: 2   Years of education: 15   Highest education level: Not on file  Occupational History   Occupation: retired  Tobacco Use   Smoking status: Former   Smokeless tobacco: Never   Tobacco comments:    Lives with spouse-independant in all ADLs. [employer] and [employer] at [employer], Married in "52"  Vaping Use   Vaping Use: Never used  Substance and Sexual Activity   Alcohol use: No   Drug use: No   Sexual activity: Yes  Other Topics Concern   Not on file  Social History Narrative   Patient occasionally drinks tea.   Patient is right handed.   Social Determinants of Health   Financial Resource Strain: Not on file  Food Insecurity: Not on file  Transportation Needs: Not on file  Physical Activity: Not on file  Stress: Not on file  Social Connections: Not on file  Intimate Partner Violence: Not on file     Review of Systems: General: negative for chills, fever, night sweats or weight changes.  Cardiovascular: negative for chest pain, dyspnea on exertion, edema, orthopnea, palpitations, paroxysmal nocturnal dyspnea or shortness of breath Dermatological:  negative for rash Respiratory: negative for cough or wheezing Urologic: negative for hematuria Abdominal: negative for nausea, vomiting, diarrhea, bright red blood per rectum, melena, or hematemesis Neurologic: negative for visual changes, syncope, or dizziness All other systems reviewed and are otherwise negative except as noted above.    Blood pressure (!) 156/74, pulse (!) 57, height 5\' 6"  (1.676 m), weight 165 lb (74.8 kg), SpO2 98 %.  General appearance: alert and no distress Neck: no adenopathy, no carotid bruit, no JVD, supple, symmetrical, trachea midline, and  thyroid not enlarged, symmetric, no tenderness/mass/nodules Lungs: clear to auscultation bilaterally Heart: regular rate and rhythm, S1, S2 normal, no murmur, click, rub or gallop Extremities: extremities normal, atraumatic, no cyanosis or edema Pulses: 2+ and symmetric Skin: Skin color, texture, turgor normal. No rashes or lesions Neurologic: Grossly normal  EKG sinus bradycardia 57 with right bundle branch block.  I personally reviewed this EKG.  ASSESSMENT AND PLAN:   Essential hypertension History of essential hypertension blood pressure measured today at 156/74.  He is no longer on Cozaar because of his moderate renal insufficiency.  He takes his blood pressure at home and it is usually in the 140/70 range.  Hyperlipidemia History of hyperlipidemia not on statin therapy followed by his PCP  Thoracic aortic aneurysm [facility]) History of thoracic aortic aneurysm measuring 42 mm by recent 2D echo 06/09/2021.  This will be followed on an annual basis.  Right bundle branch block Chronic  Moderate mitral regurgitation Recent 2D echo performed 06/09/2021 showed moderate MR with normal LV function.  This will be followed on an annual basis.  Lower extremity edema 1-2+ bilateral lower extremity edema over the last year.  We talked about the importance of limiting salt intake.  He is on furosemide 20 mg a day and has moderate renal insufficiency with a serum creatinine in the 1.4 range followed by Dr. Marval Regal.  I have asked him to limit his salt intake.  He will see an APP back in 3 months.  If he still has lower extremity edema we can consider increasing his furosemide from 20 to 40 mg a day carefully following his electrolytes and renal function.     Lorretta Harp MD FACP,FACC,FAHA, [facility] 07/06/2021 11:48 AM

## 2021-07-06 NOTE — Assessment & Plan Note (Signed)
1-2+ bilateral lower extremity edema over the last year.  We talked about the importance of limiting salt intake.  He is on furosemide 20 mg a day and has moderate renal insufficiency with a serum creatinine in the 1.4 range followed by Dr. Marval Regal.  I have asked him to limit his salt intake.  He will see an APP back in 3 months.  If he still has lower extremity edema we can consider increasing his furosemide from 20 to 40 mg a day carefully following his electrolytes and renal function.

## 2021-08-02 ENCOUNTER — Other Ambulatory Visit: Payer: Self-pay

## 2021-08-02 ENCOUNTER — Encounter: Payer: Self-pay | Admitting: Dermatology

## 2021-08-02 ENCOUNTER — Ambulatory Visit (INDEPENDENT_AMBULATORY_CARE_PROVIDER_SITE_OTHER): Payer: Medicare Other | Admitting: Dermatology

## 2021-08-02 DIAGNOSIS — C44319 Basal cell carcinoma of skin of other parts of face: Secondary | ICD-10-CM

## 2021-08-02 DIAGNOSIS — L719 Rosacea, unspecified: Secondary | ICD-10-CM | POA: Diagnosis not present

## 2021-08-02 DIAGNOSIS — C44311 Basal cell carcinoma of skin of nose: Secondary | ICD-10-CM

## 2021-08-02 DIAGNOSIS — D485 Neoplasm of uncertain behavior of skin: Secondary | ICD-10-CM

## 2021-08-02 DIAGNOSIS — L738 Other specified follicular disorders: Secondary | ICD-10-CM | POA: Diagnosis not present

## 2021-08-02 NOTE — Patient Instructions (Signed)

## 2021-08-04 ENCOUNTER — Telehealth (INDEPENDENT_AMBULATORY_CARE_PROVIDER_SITE_OTHER): Payer: Medicare Other | Admitting: *Deleted

## 2021-08-04 NOTE — Telephone Encounter (Signed)
Left patient a message to call back for pathology results. Mohs referral done just needs to be sent once patient receives his results.  ?

## 2021-08-04 NOTE — Telephone Encounter (Signed)
-----   Message from [name], MD sent at 08/04/2021  6:56 AM EDT ----- ?I discussed Mohs surgery with [name] at the time of the visit and if he is willing to go through it at age 88 I would refer for Mohs for both of these cancers. ?

## 2021-08-06 NOTE — Telephone Encounter (Signed)
Patient called me yesterday with his traveling, I returned the call earlier today.  I discussed the results of his 2 biopsies with him.  I explained Mohs surgery and he is comfortable with referring him for treatment. ?

## 2021-08-09 NOTE — Telephone Encounter (Signed)
Sent referral in exchange ?

## 2021-08-22 ENCOUNTER — Encounter: Payer: Self-pay | Admitting: Dermatology

## 2021-08-22 NOTE — Progress Notes (Signed)
? ?  Follow-Up Visit ?  ?Subjective  ?Zachary Padilla is a 88 y.o. male who presents for the following: Skin Problem (Patient here today for non healing lesion on his nose x 3-4 months with bleeding, no pain. ). ? ?Nonhealing spots on nose and right cheek, other facial skin issues. ?Location:  ?Duration:  ?Quality:  ?Associated Signs/Symptoms: ?Modifying Factors:  ?Severity:  ?Timing: ?Context:  ? ?Objective  ?Well appearing patient in no apparent distress; mood and affect are within normal limits. ?Mid Supratip of Nose ?Deep 6 mm erosion with 2 mm pearly margins, BCC ? ? ? ? ? ? ?Right Zygomatic Area ?Focally eroded 8 mm pearly papule ? ? ? ? ? ? ?Mid Forehead ?He does have central facial particularly nasal edematous erythema making evaluation of UV damage a little more challenging. ? ?Left Forehead, Right Forehead ?Multiple flesh-colored 2 to 4 mm papules particularly forehead, many with eccentric dell.  Typical dermoscopy. ? ? ? ?Skin head and neck examined. ? ? ?Assessment & Plan  ? ? ?Neoplasm of uncertain behavior of skin (2) ?Mid Supratip of Nose ? ?Skin / nail biopsy ?Type of biopsy: tangential   ?Informed consent: discussed and consent obtained   ?Timeout: patient name, date of birth, surgical site, and procedure verified   ?Procedure prep:  Patient was prepped and draped in usual sterile fashion (Non sterile) ?Prep type:  Chlorhexidine ?Anesthesia: the lesion was anesthetized in a standard fashion   ?Anesthetic:  1% lidocaine w/ epinephrine 1-100,000 local infiltration ?Instrument used: flexible razor blade   ?Hemostasis achieved with: ferric subsulfate   ?Outcome: patient tolerated procedure well   ?Post-procedure details: sterile dressing applied and wound care instructions given   ?Dressing type: bandage and petrolatum   ? ?Specimen 1 - Surgical pathology ?Differential Diagnosis: R/O BCC vs SCC - MOH's if + ? ?Check Margins: No ? ?Right Zygomatic Area ? ?Skin / nail biopsy ?Type of biopsy: tangential    ?Informed consent: discussed and consent obtained   ?Timeout: patient name, date of birth, surgical site, and procedure verified   ?Procedure prep:  Patient was prepped and draped in usual sterile fashion (Non sterile) ?Prep type:  Chlorhexidine ?Anesthesia: the lesion was anesthetized in a standard fashion   ?Anesthetic:  1% lidocaine w/ epinephrine 1-100,000 local infiltration ?Instrument used: flexible razor blade   ?Hemostasis achieved with: ferric subsulfate   ?Outcome: patient tolerated procedure well   ?Post-procedure details: sterile dressing applied and wound care instructions given   ?Dressing type: bandage and petrolatum   ? ?Specimen 2 - Surgical pathology ?Differential Diagnosis: R/O BCC vs SCC ? ?Check Margins: No ? ?Rosacea ?Mid Forehead ? ?No treatment initiated for examination. ? ?Sebaceous hyperplasia ?Left Forehead; Right Forehead ? ?Discussed with patient early [place] may resemble sebaceous hyperplasia but historically none of these lesions are changing or bothersome. ? ? ? ? ? ?I, [name], MD, have reviewed all documentation for this visit.  The documentation on 08/22/21 for the exam, diagnosis, procedures, and orders are all accurate and complete. ?

## 2021-08-30 ENCOUNTER — Ambulatory Visit: Payer: Medicare Other | Admitting: Adult Health

## 2021-09-03 ENCOUNTER — Encounter: Payer: Self-pay | Admitting: Nurse Practitioner

## 2021-09-03 ENCOUNTER — Ambulatory Visit (INDEPENDENT_AMBULATORY_CARE_PROVIDER_SITE_OTHER): Payer: Medicare Other | Admitting: Nurse Practitioner

## 2021-09-03 VITALS — BP 122/72 | Resp 20 | Ht 66.0 in | Wt 159.6 lb

## 2021-09-03 DIAGNOSIS — R600 Localized edema: Secondary | ICD-10-CM | POA: Diagnosis not present

## 2021-09-03 DIAGNOSIS — I4510 Unspecified right bundle-branch block: Secondary | ICD-10-CM

## 2021-09-03 DIAGNOSIS — I10 Essential (primary) hypertension: Secondary | ICD-10-CM | POA: Diagnosis not present

## 2021-09-03 DIAGNOSIS — I712 Thoracic aortic aneurysm, without rupture, unspecified: Secondary | ICD-10-CM

## 2021-09-03 DIAGNOSIS — I340 Nonrheumatic mitral (valve) insufficiency: Secondary | ICD-10-CM

## 2021-09-03 DIAGNOSIS — E782 Mixed hyperlipidemia: Secondary | ICD-10-CM

## 2021-09-03 DIAGNOSIS — N183 Chronic kidney disease, stage 3 unspecified: Secondary | ICD-10-CM

## 2021-09-03 NOTE — Patient Instructions (Signed)
Medication Instructions:  ?Your physician recommends that you continue on your current medications as directed. Please refer to the Current Medication list given to you today. ? ?*If you need a refill on your cardiac medications before your next appointment, please call your pharmacy* ? ? ?Lab Work: ?NONE ordered at this time of appointment  ? ?If you have labs (blood work) drawn today and your tests are completely normal, you will receive your results only by: ?MyChart Message (if you have MyChart) OR ?A paper copy in the mail ?If you have any lab test that is abnormal or we need to change your treatment, we will call you to review the results. ? ? ?Testing/Procedures: ?NONE ordered at this time of appointment  ? ? ? ?Follow-Up: ?At [facility], you and your health needs are our priority.  As part of our continuing mission to provide you with exceptional heart care, we have created designated Provider Care Teams.  These Care Teams include your primary Cardiologist (physician) and Advanced Practice Providers (APPs -  Physician Assistants and Nurse Practitioners) who all work together to provide you with the care you need, when you need it. ? ?We recommend signing up for the patient portal called "MyChart".  Sign up information is provided on this After Visit Summary.  MyChart is used to connect with patients for Virtual Visits (Telemedicine).  Patients are able to view lab/test results, encounter notes, upcoming appointments, etc.  Non-urgent messages can be sent to your provider as well.   ?To learn more about what you can do with MyChart, go to [email or web address].   ? ?Your next appointment:   ?3-4 month(s) ? ?The format for your next appointment:   ?In Person ? ?Provider:   ?Quay Burow, MD   ? ? ?Other Instructions ? ? ?Important Information About Sugar ? ? ? ? ? ? ?

## 2021-09-03 NOTE — Progress Notes (Signed)
? ? ?Office Visit  ?  ?Patient Name: Zachary Padilla ?Date of Encounter: 09/03/2021 ? ?Primary Care Provider:  Lajean Manes, MD ?Primary Cardiologist:  Quay Burow, MD ? ?Chief Complaint  ?  ?88 year old male with a history of hypertension, hyperlipidemia, RBBB, thoracic aortic aneurysm, and family history of CAD who presents for follow-up related to hypertension and lower extremity edema. ? ?Past Medical History  ?  ?Past Medical History:  ?Diagnosis Date  ? 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 ALLERGIC RHINITIS 02/23/2009  ? Basal cell carcinoma 06/23/2010  ? R cheek- (CX35FU+ exc )  ? COLONIC POLYPS, HX OF 02/23/2009  ? Convulsions/seizures ([place]) 10/24/2014  ? DIVERTICULITIS, HX OF 02/23/2009  ? Ear infection   ? Family history of heart disease   ? GERD 02/23/2009  ? Hyperlipidemia   ? HYPERTENSION 02/23/2009  ? PONV (postoperative nausea and vomiting)   ? Pre-syncope   ? Thoracic aortic aneurysm ([place])   ? URINARY INCONTINENCE 02/23/2009  ? ?Past Surgical History:  ?Procedure Laterality Date  ? APPENDECTOMY  1946  ? CATARACT EXTRACTION Bilateral   ? EYE SURGERY Right 07/25/2019  ? Left ear tube  2001  ? Removal of pre-cancer bump on neck  2005  ? Ruptured appendics  1946  ? Sinus surgery  2008  ? ? ?Allergies ? ?Allergies  ?Allergen Reactions  ? Ace Inhibitors Other (See Comments)  ?  unknown  ? Aminoglycosides   ? Erythromycin Nausea And Vomiting  ? Levofloxacin Other (See Comments)  ?  dehydration  ? Penicillins Nausea And Vomiting  ? Sulfa Antibiotics   ? Ciprofloxacin Rash  ?  hives  ? ? ?History of Present Illness  ?  ?88 year old male with the above past medical history including hypertension, hyperlipidemia, RBBB, thoracic aortic aneurysm, and family history of CAD. ? ?Myoview stress test in 2013 was normal. Chest CT angiogram in November 2013 for evaluation of potential lung cancer incidentally revealed a 4.3 cm descending thoracic aortic aneurysm. Most recent CT angio chest/aorta in July 2017 showed descending  thoracic aortic aneurysm was unchanged from prior studies. Echocardiogram in February 2023 showed EF 60 to 65%, normal LV function, moderate mitral valve regurgitation, mild aortic valve regurgitation, dilation of the aortic root and ascending aorta measuring 42 mm each.  Repeat echo was recommended in 1 year. He was last seen in the office on 07/06/2021 and was stable overall from a cardiac standpoint, though his BP was slightly elevated. Additionally, he noted bilateral lower extremity edema, he was advised to limit his salt intake follow-up in 3 months with plans to tentatively increase his furosemide from 20 to 40 mg daily if symptoms did not improve. ? ?He presents today for follow-up. Since his last visit he has been stable from a cardiac standpoint. He does have some ongoing nonpitting bilateral lower extremity edema, however, he states it is somewhat improved.  His weight is down 6 pounds since his last visit.  He is watching his salt intake and working on increasing his activity. He denies dyspnea, PND, orthopnea, weight gain, denies symptoms concerning for angina.  He states he has been under some personal stress recently as his wife has progressive memory impairment.  Overall, he reports feeling well and denies any new concerns today.  ? ?Home Medications  ?  ?Current Outpatient Medications  ?Medication Sig Dispense Refill  ? Azelaic Acid 15 % cream Apply 1 application topically 2 (two) times daily as needed. For Roscacea    ? cholecalciferol (VITAMIN D3)  25 MCG (1000 UNIT) tablet Take 1,000 Units by mouth daily.    ? furosemide (LASIX) 20 MG tablet Take 20 mg by mouth daily.    ? guaiFENesin (MUCINEX) 600 MG 12 hr tablet Take by mouth as needed.    ? levETIRAcetam (KEPPRA) 500 MG tablet TAKE ONE-HALF TABLET IN THE MORNING AND TAKE ONE TABLET IN THE EVENING 135 tablet 3  ? mupirocin ointment (BACTROBAN) 2 % as needed.    ? sodium chloride (OCEAN) 0.65 % nasal spray 2 (two) times daily.    ? ?No current  facility-administered medications for this visit.  ?  ? ?Review of Systems  ?  ?He denies chest pain, palpitations, dyspnea, pnd, orthopnea, n, v, dizziness, syncope, weight gain, or early satiety. All other systems reviewed and are otherwise negative except as noted above.  ? ?Physical Exam  ?  ?VS:  BP 122/72 (BP Location: Left Arm, Patient Position: Sitting, Cuff Size: Normal)   Resp 20   Ht '5\' 6"'$  (1.676 m)   Wt 159 lb 9.6 oz (72.4 kg)   SpO2 96%   BMI 25.76 kg/m?   ?GEN: Well nourished, well developed, in no acute distress. ?HEENT: normal. ?Neck: Supple, no JVD, carotid bruits, or masses. ?Cardiac: RRR, no murmurs, rubs, or gallops. No clubbing, cyanosis, edema.  Radials/DP/PT 2+ and equal bilaterally.  ?Respiratory:  Respirations regular and unlabored, clear to auscultation bilaterally. ?GI: Soft, nontender, nondistended, BS + x 4. ?MS: no deformity or atrophy. ?Skin: warm and dry, no rash. ?Neuro:  Strength and sensation are intact. ?Psych: Normal affect. ? ?Accessory Clinical Findings  ?  ?ECG personally reviewed by me today - No EKG in office today. ? ?Lab Results  ?Component Value Date  ? WBC 5.5 03/22/2016  ? HGB 14.3 03/22/2016  ? HCT 41.9 03/22/2016  ? MCV 96.5 03/22/2016  ? PLT 160 03/22/2016  ? ?Lab Results  ?Component Value Date  ? CREATININE 1.27 (H) 03/22/2016  ? BUN 17 03/22/2016  ? NA 134 (L) 03/22/2016  ? K 4.7 03/22/2016  ? CL 101 03/22/2016  ? CO2 27 03/22/2016  ? ?Lab Results  ?Component Value Date  ? ALT 15 03/22/2016  ? AST 22 03/22/2016  ? ALKPHOS 73 03/22/2016  ? BILITOT 1.0 03/22/2016  ? ?Lab Results  ?Component Value Date  ? CHOL 123 03/22/2016  ? HDL 50 03/22/2016  ? [place] 63 03/22/2016  ? TRIG 52 03/22/2016  ? CHOLHDL 2.5 03/22/2016  ?  ?No results found for: HGBA1C ? ?Assessment & Plan  ?  ?1. Bilateral lower extremity edema: Echo in February 2023 showed EF 60-65%, normal LV function, moderate mitral valve regurgitation, mild aortic valve regurgitation, dilation of the aortic  root and ascending aorta measuring 42 mm each. His weight is down 6 pounds since his last visit. He continues to have some mild nonpitting bilateral lower extremity edema, however, this is overall improved. He is working on decreasing his salt intake and increasing his activity. He denies dyspnea, PND, orthopnea. Discussed the possibility of increasing his Lasix, he declines further escalation of Lasix today. Discussed daily weights, sodium recommendations (including salty six). I advised him to contact our office if he notices weight gain of 3 pounds overnight or 5 pounds in a week, or for worsening edema.  He verbalized understanding. Continue Lasix at current dose. ? ?2. Hypertension: BP well controlled. Continue current antihypertensive regimen.   ? ?3. Thoracic aortic aneurysm: Measured 42 mm on most recent echocardiogram. We will continue  to monitor with routine echocardiograms given need for routine echocardiogram with history of moderate mitral valve regurgitation as below. ? ?4. Moderate mitral valve regurgitation: Most recent echo as above. Plan for repeat echo in 1 year. ? ?5. RBBB: Chronic. ? ?6. Hyperlipidemia: He has a documented history of hyperlipidemia, no recent LDL on file.  He is not on a statin.  Monitored and managed per PCP. ? ?7. CKD Stage III: No recent labs available for review.  Follows with nephrology. ? ?8. Disposition: Follow-up in 3-4 months with Dr. Gwenlyn Found.  ? ?Lenna Sciara, NP ?09/03/2021, 3:43 PM ?  ?

## 2021-09-20 ENCOUNTER — Ambulatory Visit (INDEPENDENT_AMBULATORY_CARE_PROVIDER_SITE_OTHER): Payer: Medicare Other | Admitting: Adult Health

## 2021-09-20 ENCOUNTER — Encounter: Payer: Self-pay | Admitting: Adult Health

## 2021-09-20 VITALS — BP 108/61 | HR 64 | Ht 70.0 in | Wt 159.0 lb

## 2021-09-20 DIAGNOSIS — R569 Unspecified convulsions: Secondary | ICD-10-CM

## 2021-09-20 NOTE — Progress Notes (Signed)
? ? ?PATIENT: Zachary Padilla ?DOB: 04-04-1929 ? ?REASON FOR VISIT: follow up ?HISTORY FROM: patient ? ?Chief Complaint  ?Patient presents with  ? Follow-up  ?  Pt in 4  with wife   pt is here for convulsion follow up pt states he has not had a convulsion  since his last appointment   ? ? ? ?HISTORY OF PRESENT ILLNESS: ?Today 09/20/21 ?Zachary Padilla is a 88 year old male with a history of seizures. He returns today. Continues on Keppra 250 mg in the AM and 500 mg at PM. Denies any seizures events. Stage 3 CKD followed by nephrology. No new symptoms ? ?02/19/21:Zachary Padilla is a 88 y.o. male with a history of seizures, he is here today for a follow up. He denies and seizure events. He denies excess fatigue or drowsiness. Denies any falls or changes in his memory. He continues to operate a motor vehicle and he is living at home with wife.   ?He denies any issues or concerns at this time. He is having issues with his kidneys and reports he is stage 3 CKD. He is having difficulties finding foods that are appropriate for this. His concern is if the Keppra effects his kidneys. We informed that patient that this medication is ok at the current dose. He is seeing a urologist.  ? ?HISTORY  ?02/19/20: ?Zachary Padilla is an 88 year old male with a history of seizures.  He returns today for follow-up.  He denies any seizure events.  Continues on Keppra 250 mg in the morning and 500 in the evening.  Does report that he has stage III kidney disease.  Overall he feels that he is doing well.  Returns today for evaluation. ?  ?08/20/19: ?Zachary Padilla is an 88 year old male with a history of seizures.  He returns today for follow-up.  He denies any seizure events.  Continues on Keppra 250 mg in the morning and 500 mg in the evening.  He denies any further changes with his memory.  He is able to complete all ADLs independently.  He operates a Teacher, [employer].  He returns today for an evaluation. ?  ? ?REVIEW OF SYSTEMS: Out of a complete 14 system review  of symptoms, the patient complains only of the following symptoms, and all other reviewed systems are negative. ? ?ALLERGIES: ?Allergies  ?Allergen Reactions  ? Ace Inhibitors Other (See Comments)  ?  unknown  ? Aminoglycosides   ? Erythromycin Nausea And Vomiting  ? Levofloxacin Other (See Comments)  ?  dehydration  ? Penicillins Nausea And Vomiting  ? Sulfa Antibiotics   ? Ciprofloxacin Rash  ?  hives  ? ? ?HOME MEDICATIONS: ?Outpatient Medications Prior to Visit  ?Medication Sig Dispense Refill  ? Azelaic Acid 15 % cream Apply 1 application topically 2 (two) times daily as needed. For Roscacea    ? cholecalciferol (VITAMIN D3) 25 MCG (1000 UNIT) tablet Take 1,000 Units by mouth daily.    ? furosemide (LASIX) 20 MG tablet Take 20 mg by mouth daily.    ? guaiFENesin (MUCINEX) 600 MG 12 hr tablet Take by mouth as needed.    ? levETIRAcetam (KEPPRA) 500 MG tablet TAKE ONE-HALF TABLET IN THE MORNING AND TAKE ONE TABLET IN THE EVENING 135 tablet 3  ? mupirocin ointment (BACTROBAN) 2 % as needed.    ? UNABLE TO FIND as needed. Simple saline    ? sodium chloride (OCEAN) 0.65 % nasal spray 2 (two) times daily.    ? ?  No facility-administered medications prior to visit.  ? ? ?PAST MEDICAL HISTORY: ?Past Medical History:  ?Diagnosis Date  ? 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 ALLERGIC RHINITIS 02/23/2009  ? Basal cell carcinoma 06/23/2010  ? R cheek- (CX35FU+ exc )  ? COLONIC POLYPS, HX OF 02/23/2009  ? Convulsions/seizures ([place]) 10/24/2014  ? DIVERTICULITIS, HX OF 02/23/2009  ? Ear infection   ? Family history of heart disease   ? GERD 02/23/2009  ? Hyperlipidemia   ? HYPERTENSION 02/23/2009  ? PONV (postoperative nausea and vomiting)   ? Pre-syncope   ? Thoracic aortic aneurysm ([place])   ? URINARY INCONTINENCE 02/23/2009  ? ? ?PAST SURGICAL HISTORY: ?Past Surgical History:  ?Procedure Laterality Date  ? APPENDECTOMY  1946  ? CATARACT EXTRACTION Bilateral   ? EYE SURGERY Right 07/25/2019  ? Left ear tube  2001  ? Removal of pre-cancer  bump on neck  2005  ? Ruptured appendics  1946  ? Sinus surgery  2008  ? ? ?FAMILY HISTORY: ?Family History  ?Problem Relation Age of Onset  ? Heart disease Mother   ? Stroke Father   ? Heart disease Father   ? Cancer Sister   ? Cancer Brother   ? Heart disease Other   ? Stroke Other   ? Seizures Neg Hx   ? ? ?SOCIAL HISTORY: ?Social History  ? ?Socioeconomic History  ? Marital status: Married  ?  Spouse name: Not on file  ? Number of children: 2  ? Years of education: 28  ? Highest education level: Not on file  ?Occupational History  ? Occupation: retired  ?Tobacco Use  ? Smoking status: Former  ? Smokeless tobacco: Never  ? Tobacco comments:  ?  Lives with spouse-independant in all ADLs. [employer] and [employer] at [employer], Married in "52"  ?Vaping Use  ? Vaping Use: Never used  ?Substance and Sexual Activity  ? Alcohol use: No  ? Drug use: No  ? Sexual activity: Yes  ?Other Topics Concern  ? Not on file  ?Social History Narrative  ? Patient occasionally drinks tea.  ? Patient is right handed.  ? ?Social Determinants of Health  ? ?Financial Resource Strain: Not on file  ?Food Insecurity: Not on file  ?Transportation Needs: Not on file  ?Physical Activity: Not on file  ?Stress: Not on file  ?Social Connections: Not on file  ?Intimate Partner Violence: Not on file  ? ? ? ?PHYSICAL EXAM ? ?Vitals:  ? 09/20/21 1452  ?BP: 108/61  ?Pulse: 64  ?Weight: 159 lb (72.1 kg)  ?Height: '5\' 10"'$  (1.778 m)  ? ?Body mass index is 22.81 kg/m?. ? ?Generalized: Well developed, in no acute distress  ? ?Neurological examination  ?Mentation: Alert oriented to time, place, history taking. Follows all commands speech and language fluent ?Cranial nerve II-XII: Pupils were equal round reactive to light. Extraocular movements were full, visual field were full on confrontational test. Facial sensation and strength were normal.  Head turning and shoulder shrug  were normal and symmetric. ?Motor: The motor testing reveals 5  over 5 strength of all 4 extremities. Good symmetric motor tone is noted throughout.  ?Sensory: Sensory testing is intact to soft touch on all 4 extremities. No evidence of extinction is noted.  ?Coordination: Cerebellar testing reveals good finger-nose-finger and heel-to-shin bilaterally.  ?Gait and station: Gait is normal. ? ? ?DIAGNOSTIC DATA (LABS, IMAGING, TESTING) ?- I reviewed patient records, labs, notes, testing and imaging myself where available. ? ?Lab  Results  ?Component Value Date  ? WBC 5.5 03/22/2016  ? HGB 14.3 03/22/2016  ? HCT 41.9 03/22/2016  ? MCV 96.5 03/22/2016  ? PLT 160 03/22/2016  ? ?   ?Component Value Date/Time  ? NA 134 (L) 03/22/2016 1128  ? K 4.7 03/22/2016 1128  ? CL 101 03/22/2016 1128  ? CO2 27 03/22/2016 1128  ? GLUCOSE 80 03/22/2016 1128  ? BUN 17 03/22/2016 1128  ? CREATININE 1.27 (H) 03/22/2016 1128  ? CALCIUM 8.7 03/22/2016 1128  ? PROT 6.6 03/22/2016 1128  ? ALBUMIN 3.9 03/22/2016 1128  ? AST 22 03/22/2016 1128  ? ALT 15 03/22/2016 1128  ? ALKPHOS 73 03/22/2016 1128  ? BILITOT 1.0 03/22/2016 1128  ? GFRNONAA 51 (L) 03/22/2016 1128  ? GFRAA 59 (L) 03/22/2016 1128  ? ?Lab Results  ?Component Value Date  ? CHOL 123 03/22/2016  ? HDL 50 03/22/2016  ? [place] 63 03/22/2016  ? TRIG 52 03/22/2016  ? CHOLHDL 2.5 03/22/2016  ? ?No results found for: HGBA1C ?No results found for: VITAMINB12 ?Lab Results  ?Component Value Date  ? TSH 1.16 03/22/2016  ? ? ? ?ASSESSMENT AND PLAN ?88 y.o. year old male  has a past medical history of ACNE ROSACEA (09/09/2009), ALLERGIC RHINITIS (02/23/2009), Basal cell carcinoma (06/23/2010), COLONIC POLYPS, HX OF (02/23/2009), Convulsions/seizures ([place]) (10/24/2014), DIVERTICULITIS, HX OF (02/23/2009), Ear infection, Family history of heart disease, GERD (02/23/2009), Hyperlipidemia, HYPERTENSION (02/23/2009), PONV (postoperative nausea and vomiting), Pre-syncope, Thoracic aortic aneurysm ([place]), and URINARY INCONTINENCE (02/23/2009). here with  ? ?Convulsions;  unspecified type ? ?-- Continue Keppra 250 mg in the morning and 500 mg in the evening. ?-- Advised to call if he has worsening or new symptoms ?-- FU in 1 year or sooner if needed ? ? ? ?Ward Givens

## 2021-09-28 ENCOUNTER — Encounter (INDEPENDENT_AMBULATORY_CARE_PROVIDER_SITE_OTHER): Payer: Medicare Other | Admitting: Ophthalmology

## 2021-09-29 ENCOUNTER — Telehealth: Payer: Self-pay | Admitting: Dermatology

## 2021-09-29 ENCOUNTER — Encounter (INDEPENDENT_AMBULATORY_CARE_PROVIDER_SITE_OTHER): Payer: Self-pay | Admitting: Ophthalmology

## 2021-09-29 ENCOUNTER — Ambulatory Visit (INDEPENDENT_AMBULATORY_CARE_PROVIDER_SITE_OTHER): Payer: Medicare Other | Admitting: Ophthalmology

## 2021-09-29 DIAGNOSIS — H02051 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 entropian right upper eyelid: Secondary | ICD-10-CM

## 2021-09-29 DIAGNOSIS — H35071 Retinal telangiectasis, right eye: Secondary | ICD-10-CM

## 2021-09-29 DIAGNOSIS — H35341 Macular cyst, hole, or pseudohole, right eye: Secondary | ICD-10-CM | POA: Insufficient documentation

## 2021-09-29 DIAGNOSIS — H35072 Retinal telangiectasis, left eye: Secondary | ICD-10-CM

## 2021-09-29 NOTE — Progress Notes (Signed)
09/29/2021     CHIEF COMPLAINT Patient presents for  Chief Complaint  Patient presents with   Eye Problem      HISTORY OF PRESENT ILLNESS: Zachary Padilla is a 88 y.o. male who presents to the clinic today for:   HPI   WIP- Distortion in right eye, surgery around 07/25/2019 Vitrectomy membrane peel OD by Dr. Zadie Rhine Patient states "the right eye that was operated on feels like it has scratchiness, like something like an eyelash is in my eye. It is distorted, not as clear as it used to be. I am scheduled to have skin cancer surgery on my face in the end of June." Patient reports he noticed the change in vision 2-3 weeks ago, states it is not getting worse. Patient allergic to ciprofloxacin. Last edited by Laurin Coder on 09/29/2021  9:25 AM.      Referring physician: Lajean Manes, MD [address],   [id number]  HISTORICAL INFORMATION:   Selected notes from the [facility]: No current outpatient medications on file. (Ophthalmic Drugs)   No current facility-administered medications for this visit. (Ophthalmic Drugs)   Current Outpatient Medications (Other)  Medication Sig   Azelaic Acid 15 % cream Apply 1 application topically 2 (two) times daily as needed. For [place]   cholecalciferol (VITAMIN D3) 25 MCG (1000 UNIT) tablet Take 1,000 Units by mouth daily.   furosemide (LASIX) 20 MG tablet Take 20 mg by mouth daily.   guaiFENesin (MUCINEX) 600 MG 12 hr tablet Take by mouth as needed.   levETIRAcetam (KEPPRA) 500 MG tablet TAKE ONE-HALF TABLET IN THE MORNING AND TAKE ONE TABLET IN THE EVENING   mupirocin ointment (BACTROBAN) 2 % as needed.   sodium chloride (OCEAN) 0.65 % nasal spray 2 (two) times daily.   UNABLE TO FIND as needed. Simple saline   No current facility-administered medications for this visit. (Other)      REVIEW OF SYSTEMS: ROS   Negative for: Constitutional, Gastrointestinal,  Neurological, Skin, Genitourinary, Musculoskeletal, HENT, Endocrine, Cardiovascular, Eyes, Respiratory, Psychiatric, Allergic/Imm, Heme/Lymph Last edited by Hurman Horn, MD on 09/29/2021  9:45 AM.       ALLERGIES Allergies  Allergen Reactions   Ace Inhibitors Other (See Comments)    unknown   Aminoglycosides    Erythromycin Nausea And Vomiting   Levofloxacin Other (See Comments)    dehydration   Penicillins Nausea And Vomiting   Sulfa Antibiotics    Ciprofloxacin Rash    hives    PAST MEDICAL HISTORY Past Medical History:  Diagnosis Date   ACNE ROSACEA 09/09/2009   ALLERGIC RHINITIS 02/23/2009   Basal cell carcinoma 06/23/2010   R cheek- (CX35FU+ exc )   COLONIC POLYPS, HX OF 02/23/2009   Convulsions/seizures ([place]) 10/24/2014   DIVERTICULITIS, HX OF 02/23/2009   Ear infection    Family history of heart disease    GERD 02/23/2009   Hyperlipidemia    HYPERTENSION 02/23/2009   PONV (postoperative nausea and vomiting)    Pre-syncope    Thoracic aortic aneurysm ([place])    URINARY INCONTINENCE 02/23/2009   Past Surgical History:  Procedure Laterality Date   APPENDECTOMY  1946   CATARACT EXTRACTION Bilateral    EYE SURGERY Right 07/25/2019   Left ear tube  2001   Removal of pre-cancer bump on neck  2005   Ruptured appendics  1946   Sinus surgery  2008  FAMILY HISTORY Family History  Problem Relation Age of Onset   Heart disease Mother    Stroke Father    Heart disease Father    Cancer Sister    Cancer Brother    Heart disease Other    Stroke Other    Seizures Neg Hx     SOCIAL HISTORY Social History   Tobacco Use   Smoking status: Former   Smokeless tobacco: Never   Tobacco comments:    Lives with spouse-independant in all ADLs. [employer] and [employer] at [employer], Married in "52"  Vaping Use   Vaping Use: Never used  Substance Use Topics   Alcohol use: No   Drug use: No         OPHTHALMIC EXAM:  Base Eye Exam      Visual Acuity (ETDRS)       Right Left   Dist cc 20/30 -2 20/20 -1   Dist ph cc NI     Correction: Glasses  Right eye reports "they are curved."        Tonometry (Tonopen, 9:24 AM)       Right Left   Pressure 12 10         Pupils       Pupils Dark Light APD   Right PERRL 3 2 None   Left PERRL 3 2 None         Visual Fields (Counting fingers)       Left Right    Full Full         Extraocular Movement       Right Left    Full Full         Neuro/Psych     Oriented x3: Yes   Mood/Affect: Normal         Dilation     Both eyes: 1.0% Mydriacyl, 2.5% Phenylephrine @ 9:24 AM           Slit Lamp and Fundus Exam     External Exam       Right Left   External Normal Normal         Slit Lamp Exam       Right Left   Lids/Lashes Normal, trichiasis temporally, 3-4 small thin lashes abrading the conjunctival surface Normal   Conjunctiva/Sclera Linney and quiet Heberlein and quiet   Cornea  , Debris in tear film completely resolved, epithelium and cornea are now clear Clear   Anterior Chamber Deep and quiet Deep and quiet   Iris Round and reactive Round and reactive   Lens Clear Clear   Anterior Vitreous Normal Normal         Fundus Exam       Right Left   Posterior Vitreous Vitrectomized Normal   Disc Normal Normal   C/D Ratio 0.5 0.7   Macula foveal topographic distortion, minor ERM nasally, early macular hole visible, positive Watzke 50% Microaneurysms adjacent to the fovea, temp and nas no exudate, no thickening   Vessels Normal Normal   Periphery Normal Normal            IMAGING AND PROCEDURES  Imaging and Procedures for 09/29/21  OCT, Retina - OU - Both Eyes       Right Eye Quality was good. Scan locations included subfoveal. Central Foveal Thickness: 301. Progression has been stable. Findings include epiretinal membrane.   Left Eye Quality was good. Scan locations included subfoveal. Central Foveal Thickness: 271.  Progression has been stable. Findings  include cystoid macular edema.   Notes Minor remnant nasal, active foveal distortion OD, with outer retinal hole formation visible OD with disruption of the photoreceptor layer  OS with subtle residual temporal intraretinal fluid, CME as compared to 3 weeks prior.  These were not seen on OCT evaluation June 2022.  They did develop some 6 months later potentially coincident with 5 weeks off of CPAP use.  OS now with complete resolution of temporal parafoveal intraretinal fluid, CME, with consistent use of CPAP over the last several months  OD however with new macular hole.       Color Fundus Photography Optos - OU - Both Eyes       Right Eye Macula : epiretinal membrane.   Notes Superior to FAZ and nasal to FAZ.,  Early hole formation OD             ASSESSMENT/PLAN:  Macular hole of right eye New onset without a retinal hole visual symptomatology with minor ILM traction on the nasal aspect of the FAZ.  Will need vitrectomy membrane peel with gas injection and postoperatively 3 to 5 days of reading position during waking hours.  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 Macular hole of right eye  H35.341     3. 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  OD, with minor trichiasis, epilation carried out without difficulty in the office today  2.  MAC-TEL, retinal telangiectasia left eye have  resolved with coincident resumption of effective CPAP use over the last 4 months.  3.  OD however with macular hole formation outer retinal hole with only a thin island of tissue remaining symptomatic to the patient with tractional forces from ILM progression nasally.  Will need vitrectomy membrane peel this area with gas injection to close the hole  Risk benefits and reviewed as well as positioning requirements postoperatively.  We will schedule vitrectomy membrane peel gas injection right eye  Ophthalmic Meds Ordered this visit:  No orders of the defined types were placed in this encounter.      Return ,, [place], LMAC, for Schedule vitrectomy, membrane peel gas injection-[id number].  There are no Patient Instructions on file for this visit.   Explained the diagnoses, plan, and follow up with the patient and they expressed understanding.  Patient expressed understanding of the importance of proper follow up care.   Clent Demark Tawsha Terrero M.D. Diseases & Surgery of the Retina and Vitreous [facility] [place] 09/29/21     Abbreviations: M myopia (nearsighted); A astigmatism; H hyperopia (farsighted); P presbyopia; Mrx spectacle prescription;  CTL contact lenses; OD right eye; OS left eye; OU both eyes  XT exotropia; ET esotropia; PEK punctate epithelial keratitis; PEE punctate epithelial erosions; DES dry eye syndrome; MGD meibomian gland dysfunction; ATs artificial tears; PFAT's preservative free artificial tears; [place] nuclear sclerotic cataract; PSC posterior subcapsular  cataract; ERM epi-retinal membrane; PVD posterior vitreous detachment; RD retinal detachment; DM diabetes mellitus; DR diabetic retinopathy; NPDR non-proliferative diabetic retinopathy; PDR proliferative diabetic retinopathy; CSME clinically significant macular edema; DME diabetic macular edema; dbh dot blot hemorrhages; CWS cotton wool spot; POAG primary open angle glaucoma; C/D cup-to-disc ratio; HVF humphrey visual field; GVF  goldmann visual field; OCT optical coherence tomography; IOP intraocular pressure; BRVO Branch retinal vein occlusion; CRVO central retinal vein occlusion; CRAO central retinal artery occlusion; BRAO branch retinal artery occlusion; RT retinal tear; SB scleral buckle; PPV pars plana vitrectomy; VH Vitreous hemorrhage; PRP panretinal laser photocoagulation; IVK intravitreal kenalog; VMT vitreomacular traction; MH Macular hole;  NVD neovascularization of the disc; NVE neovascularization elsewhere; AREDS age related eye disease study; ARMD age related macular degeneration; POAG primary open angle glaucoma; EBMD epithelial/anterior basement membrane dystrophy; ACIOL anterior chamber intraocular lens; IOL intraocular lens; PCIOL posterior chamber intraocular lens; Phaco/IOL phacoemulsification with intraocular lens placement; [place] photorefractive keratectomy; LASIK laser assisted in situ keratomileusis; HTN hypertension; DM diabetes mellitus; COPD chronic obstructive pulmonary disease

## 2021-09-29 NOTE — Assessment & Plan Note (Signed)
New onset without a retinal hole visual symptomatology with minor ILM traction on the nasal aspect of the FAZ.  Will need vitrectomy membrane peel with gas injection and postoperatively 3 to 5 days of reading position during waking hours.  Sleeping on either side

## 2021-09-29 NOTE — Assessment & Plan Note (Signed)
For lashes epilated with jeweler's forceps, improved symptomatology immediately after removal

## 2021-09-29 NOTE — Assessment & Plan Note (Signed)
OS with mask issues resolved and is coincident with CPAP use now for the last 4 months, excellent with resolution of micro CME temporal to FAZ.

## 2021-09-29 NOTE — Telephone Encounter (Signed)
Phone call to patient regarding his upcoming MOH's procedure. Message left for patient to give the office a call back.

## 2021-09-29 NOTE — Telephone Encounter (Signed)
Patient left message on office voice mail saying that he had more questions about his upcoming procedure for moh's.

## 2021-09-30 ENCOUNTER — Encounter (INDEPENDENT_AMBULATORY_CARE_PROVIDER_SITE_OTHER): Payer: Self-pay

## 2021-10-04 NOTE — Progress Notes (Unsigned)
Cardiology Clinic Note   Patient Name: Cori Henningsen Date of Encounter: 10/05/2021  Primary Care Provider:  Lajean Manes, MD Primary Cardiologist:  Quay Burow, MD  Patient Profile    Jamario Colina 88 year old male presents to the clinic today for follow-up evaluation of his lower extremity edema and essential hypertension.  Past Medical History    Past Medical History:  Diagnosis Date   ACNE ROSACEA 09/09/2009   ALLERGIC RHINITIS 02/23/2009   Basal cell carcinoma 06/23/2010   R cheek- (CX35FU+ exc )   COLONIC POLYPS, HX OF 02/23/2009   Convulsions/seizures ([place]) 10/24/2014   DIVERTICULITIS, HX OF 02/23/2009   Ear infection    Family history of heart disease    GERD 02/23/2009   Hyperlipidemia    HYPERTENSION 02/23/2009   PONV (postoperative nausea and vomiting)    Pre-syncope    Thoracic aortic aneurysm ([place])    URINARY INCONTINENCE 02/23/2009   Past Surgical History:  Procedure Laterality Date   APPENDECTOMY  1946   CATARACT EXTRACTION Bilateral    EYE SURGERY Right 07/25/2019   Left ear tube  2001   Removal of pre-cancer bump on neck  2005   Ruptured appendics  1946   Sinus surgery  2008    Allergies  Allergies  Allergen Reactions   Ace Inhibitors Other (See Comments)    unknown   Aminoglycosides    Erythromycin Nausea And Vomiting   Levofloxacin Other (See Comments)    dehydration   Penicillins Nausea And Vomiting   Sulfa Antibiotics    Ciprofloxacin Rash    hives    History of Present Illness    Myking Sar has a PMH of HLD, HTN, RBBB, thoracic aortic aneurysm and GERD.  Echocardiogram 2/23 showed an EF of 60-65%, normal LV function, moderate mitral valve regurgitation, mild aortic regurgitation, dilation of aortic root which measured 42 mm.    He presented for follow-up evaluation 07/06/2021.  At that time he remained stable from a cardiac standpoint.  His blood pressure was slightly elevated.  He was noted to have bilateral lower extremity  swelling.  Low-sodium diet was encouraged.  Follow-up was planned for 3 months.  Increasing his furosemide was discussed/planned if his lower extremity swelling did not improve.  He was seen in follow-up by Diona Browner NP-C on 09/03/2021.  During that time he continued to be stable from a cardiac standpoint.  He was noted to have bilateral lower extremity nonpitting edema.  He felt it had improved somewhat.  His weight was down around 6 pounds.  He was conscious of his diet and trying to maintain low salt.  He was working on increasing his physical activity.  He denied orthopnea and PND.  It was noted that his wife had progressive memory impairment.  Patient denied concerns and reported feeling well.  He presents to the clinic today for follow-up evaluation states he feels well.  He reports that he typically has a little bit more swelling in his left ankle than in his right ankle.  He occasionally notices dizzy episodes.  These are brief and he reports 2 episodes in the last 3 months.  He does not know if this is heart related or if it is related to regular ear infections and sinus infections.  He feels that he stays fairly active doing yard work and jobs around his house.  We reviewed the importance of low-cholesterol and high-fiber foods.  He expressed understanding.  We reviewed his most recent echocardiogram related to his  mitral valve and aortic root dilation.  He expressed understanding.  I have asked him to maintain his physical activity, continue a heart healthy low-sodium diet, and we will plan follow-up in 6 months.  Today he denies chest pain, shortness of breath, lower extremity edema, fatigue, palpitations, melena, hematuria, hemoptysis, diaphoresis, weakness, presyncope, syncope, orthopnea, and PND.    Home Medications    Prior to Admission medications   Medication Sig Start Date End Date Taking? Authorizing Provider  Azelaic Acid 15 % cream Apply 1 application topically 2 (two) times  daily as needed. For [place]    [provider]  cholecalciferol (VITAMIN D3) 25 MCG (1000 UNIT) tablet Take 1,000 Units by mouth daily.    [provider]  furosemide (LASIX) 20 MG tablet Take 20 mg by mouth daily. 06/24/20   [provider]  guaiFENesin (MUCINEX) 600 MG 12 hr tablet Take by mouth as needed.    [provider]  levETIRAcetam (KEPPRA) 500 MG tablet TAKE ONE-HALF TABLET IN THE MORNING AND TAKE ONE TABLET IN THE EVENING 10/20/20   Ward Givens, NP  mupirocin ointment (BACTROBAN) 2 % as needed. 04/15/20   [provider]  sodium chloride (OCEAN) 0.65 % nasal spray 2 (two) times daily.    [provider]  UNABLE TO FIND as needed. Simple saline    [provider]    Family History    Family History  Problem Relation Age of Onset   Heart disease Mother    Stroke Father    Heart disease Father    Cancer Sister    Cancer Brother    Heart disease Other    Stroke Other    Seizures Neg Hx    He indicated that his mother is deceased. He indicated that his father is deceased. He indicated that both of his sisters are deceased. He indicated that his brother is deceased. He indicated that the status of his neg hx is unknown. He indicated that the status of his other is unknown.  Social History    Social History   Socioeconomic History   Marital status: Married    Spouse name: Not on file   Number of children: 2   Years of education: 15   Highest education level: Not on file  Occupational History   Occupation: retired  Tobacco Use   Smoking status: Former   Smokeless tobacco: Never   Tobacco comments:    Lives with spouse-independant in all ADLs. [employer] and [employer] at [employer], Married in "52"  Vaping Use   Vaping Use: Never used  Substance and Sexual Activity   Alcohol use: No   Drug use: No   Sexual activity: Yes  Other Topics Concern   Not on file  Social History Narrative    Patient occasionally drinks tea.   Patient is right handed.   Social Determinants of Health   Financial Resource Strain: Not on file  Food Insecurity: Not on file  Transportation Needs: Not on file  Physical Activity: Not on file  Stress: Not on file  Social Connections: Not on file  Intimate Partner Violence: Not on file     Review of Systems    General:  No chills, fever, night sweats or weight changes.  Cardiovascular:  No chest pain, dyspnea on exertion, edema, orthopnea, palpitations, paroxysmal nocturnal dyspnea. Dermatological: No rash, lesions/masses Respiratory: No cough, dyspnea Urologic: No hematuria, dysuria Abdominal:   No nausea, vomiting, diarrhea, bright red blood per  rectum, melena, or hematemesis Neurologic:  No visual changes, wkns, changes in mental status. All other systems reviewed and are otherwise negative except as noted above.  Physical Exam    VS:  BP 118/73   Pulse 68   Ht '5\' 10"'$  (1.778 m)   Wt 162 lb 3.2 oz (73.6 kg)   SpO2 97%   BMI 23.27 kg/m  , BMI Body mass index is 23.27 kg/m. GEN: Well nourished, well developed, in no acute distress. HEENT: normal. Neck: Supple, no JVD, carotid bruits, or masses. Cardiac: RRR, 2/6 murmur heard best at [place], rubs, or gallops. No clubbing, cyanosis, edema.  Radials/DP/PT 2+ and equal bilaterally.  Respiratory:  Respirations regular and unlabored, clear to auscultation bilaterally. GI: Soft, nontender, nondistended, BS + x 4. MS: no deformity or atrophy. Skin: warm and dry, no rash. Neuro:  Strength and sensation are intact. Psych: Normal affect.  Accessory Clinical Findings    Recent Labs: No results found for requested labs within last 8760 hours.   Recent Lipid Panel    Component Value Date/Time   CHOL 123 03/22/2016 1128   TRIG 52 03/22/2016 1128   HDL 50 03/22/2016 1128   CHOLHDL 2.5 03/22/2016 1128   VLDL 10 03/22/2016 1128   LDLCALC 63 03/22/2016 1128    ECG personally reviewed by  me today-none today.  Echocardiogram 06/09/2021  IMPRESSIONS     1. Left ventricular ejection fraction, by estimation, is 60 to 65%. The  left ventricle has normal function. The left ventricle has no regional  wall motion abnormalities. Left ventricular diastolic parameters are  indeterminate.   2. Right ventricular systolic function is normal. The right ventricular  size is normal. There is normal pulmonary artery systolic pressure. The  estimated right ventricular systolic pressure is 40.9 mmHg.   3. The mitral valve is normal in structure. Moderate mitral valve  regurgitation. No evidence of mitral stenosis. MR PISA: MR radius 0.7cm,  MR ERO 0.22cm2, MR regurgitant volume 37cc.   4. The aortic valve is normal in structure. Aortic valve regurgitation is  mild. No aortic stenosis is present.   5. Aortic dilatation noted. There is mild dilatation of the aortic root,  measuring 42 mm. There is mild dilatation of the ascending aorta,  measuring 42 mm.   6. The inferior vena cava is normal in size with greater than 50%  respiratory variability, suggesting right atrial pressure of 3 mmHg. Assessment & Plan   1.  Lower extremity swelling-bilateral lower extremity nonpitting edema. Reports compliance with low-sodium diet. Continue furosemide Heart healthy low-sodium diet-salty 6 given-reviewed Increase physical activity as tolerated  Essential hypertension-BP today 118/73.  Well-controlled at home. Maintain blood pressure log Heart healthy low-sodium diet-salty 6 given Increase physical activity as tolerated  RBBB-heart rate today 68 bpm.  Denies presyncope syncope and lightheadedness.  EKG 07/06/2021 stable.  Thoracic aortic aneurysm-denies recent episodes of back or chest discomfort.  Follow-up echocardiogram 2/23 showed mild aortic root dilation measuring 42 mm. Recommend repeat echocardiogram 2/24  CKD stage III-follows with nephrology  Hyperlipidemia-follows with  PCP  Disposition: Follow-up with Dr. Gwenlyn Found or me in 6 months.  Jossie Ng. Kahleel Fadeley NP-C    10/05/2021, 1:51 PM [facility] [address] Office [phone] Fax [phone]  Notice: This dictation was prepared with Dragon dictation along with smaller phrase technology. Any transcriptional errors that result from this process are unintentional and may not be corrected upon review.  I spent 14 minutes examining  this patient, reviewing medications, and using patient centered shared decision making involving her cardiac care.  Prior to her visit I spent greater than 20 minutes reviewing her past medical history,  medications, and prior cardiac tests.

## 2021-10-05 ENCOUNTER — Encounter: Payer: Self-pay | Admitting: General Practice

## 2021-10-05 ENCOUNTER — Ambulatory Visit (INDEPENDENT_AMBULATORY_CARE_PROVIDER_SITE_OTHER): Payer: Medicare Other | Admitting: General Practice

## 2021-10-05 VITALS — BP 118/73 | HR 68 | Ht 70.0 in | Wt 162.2 lb

## 2021-10-05 DIAGNOSIS — I10 Essential (primary) hypertension: Secondary | ICD-10-CM | POA: Diagnosis not present

## 2021-10-05 DIAGNOSIS — I712 Thoracic aortic aneurysm, without rupture, unspecified: Secondary | ICD-10-CM

## 2021-10-05 DIAGNOSIS — R600 Localized edema: Secondary | ICD-10-CM

## 2021-10-05 DIAGNOSIS — E782 Mixed hyperlipidemia: Secondary | ICD-10-CM

## 2021-10-05 DIAGNOSIS — N183 Chronic kidney disease, stage 3 unspecified: Secondary | ICD-10-CM

## 2021-10-05 DIAGNOSIS — I4510 Unspecified right bundle-branch block: Secondary | ICD-10-CM

## 2021-10-05 NOTE — Telephone Encounter (Signed)
Phone call to patient regarding his upcoming MOH's procedure. Voicemail left for patient to give the office a call back.

## 2021-10-05 NOTE — Patient Instructions (Signed)
Medication Instructions:  The current medical regimen is effective;  continue present plan and medications as directed. Please refer to the Current Medication list given to you today.   *If you need a refill on your cardiac medications before your next appointment, please call your pharmacy*  Lab Work:   Testing/Procedures:  none    none If you have labs (blood work) drawn today and your tests are completely normal, you will receive your results only by: [facility] (if you have MyChart) OR  A paper copy in the mail If you have any lab test that is abnormal or we need to change your treatment, we will call you to review the results.  Follow-Up: Your next appointment:  6 month(s) In Person with Quay Burow, MD    At [facility], you and your health needs are our priority.  As part of our continuing mission to provide you with exceptional heart care, we have created designated Provider Care Teams.  These Care Teams include your primary Cardiologist (physician) and Advanced Practice Providers (APPs -  Physician Assistants and Nurse Practitioners) who all work together to provide you with the care you need, when you need it.  We recommend signing up for the patient portal called "MyChart".  Sign up information is provided on this After Visit Summary.  MyChart is used to connect with patients for Virtual Visits (Telemedicine).  Patients are able to view lab/test results, encounter notes, upcoming appointments, etc.  Non-urgent messages can be sent to your provider as well.   To learn more about what you can do with MyChart, go to [email or web address].      Important Information About Sugar

## 2021-10-07 ENCOUNTER — Other Ambulatory Visit: Payer: Self-pay | Admitting: Adult Health

## 2021-10-11 ENCOUNTER — Encounter (INDEPENDENT_AMBULATORY_CARE_PROVIDER_SITE_OTHER): Payer: Medicare Other | Admitting: Ophthalmology

## 2021-10-11 ENCOUNTER — Ambulatory Visit (INDEPENDENT_AMBULATORY_CARE_PROVIDER_SITE_OTHER): Payer: Medicare Other

## 2021-10-12 ENCOUNTER — Encounter (INDEPENDENT_AMBULATORY_CARE_PROVIDER_SITE_OTHER): Payer: Self-pay

## 2021-10-12 ENCOUNTER — Ambulatory Visit (INDEPENDENT_AMBULATORY_CARE_PROVIDER_SITE_OTHER): Payer: Medicare Other

## 2021-10-12 ENCOUNTER — Other Ambulatory Visit (INDEPENDENT_AMBULATORY_CARE_PROVIDER_SITE_OTHER): Payer: Self-pay | Admitting: Ophthalmology

## 2021-10-12 MED ORDER — TOBRAMYCIN-DEXAMETHASONE 0.3-0.1 % OP SUSP: 1.0000 [drp] | Freq: Four times a day (QID) | OPHTHALMIC | 0 refills | Status: DC

## 2021-10-12 MED ORDER — PREDNISOLONE ACETATE 1 % OP SUSP: 1.0000 [drp] | Freq: Four times a day (QID) | OPHTHALMIC | 0 refills | Status: AC

## 2021-10-12 MED ORDER — TOBRAMYCIN 0.3 % OP OINT: TOPICAL_OINTMENT | Freq: Two times a day (BID) | OPHTHALMIC | 0 refills | Status: DC

## 2021-10-12 MED ORDER — TOBRAMYCIN 0.3 % OP SOLN: 1.0000 [drp] | OPHTHALMIC | 0 refills | Status: AC

## 2021-10-12 NOTE — Progress Notes (Addendum)
10/12/2021     CHIEF COMPLAINT Patient presents for Pre-op Exam   HISTORY OF PRESENT ILLNESS: Zachary Padilla is a 88 y.o. male who presents to the clinic today for:   HPI   Pre OP OD sx 10/13/2021. Patient states vision is stable and unchanged since last visit. Denies any new floaters or FOL. Patient allergy to ciprofloxacin. Last edited by Laurin Coder on 10/12/2021 11:29 AM.        HISTORICAL INFORMATION:   Selected notes from the MEDICAL RECORD NUMBER       CURRENT MEDICATIONS: Current Outpatient Medications (Ophthalmic Drugs)  Medication Sig   prednisoLONE acetate (PRED FORTE) 1 % ophthalmic suspension Place 1 drop into the right eye 4 (four) times daily for 21 days.   tobramycin (TOBREX) 0.3 % ophthalmic ointment Place into the right eye 2 (two) times daily for 10 days. Place a 1/2 inch ribbon of ointment into the lower eyelid.   tobramycin-dexamethasone (TOBRADEX) ophthalmic solution Place 1 drop into the right eye in the morning, at noon, in the evening, and at bedtime for 21 days.   No current facility-administered medications for this visit. (Ophthalmic Drugs)   Current Outpatient Medications (Other)  Medication Sig   Azelaic Acid 15 % cream Apply 1 application topically 2 (two) times daily as needed. For Roscacea   cholecalciferol (VITAMIN D3) 25 MCG (1000 UNIT) tablet Take 1,000 Units by mouth daily.   furosemide (LASIX) 20 MG tablet Take 20 mg by mouth daily.   guaiFENesin (MUCINEX) 600 MG 12 hr tablet Take by mouth as needed.   levETIRAcetam (KEPPRA) 500 MG tablet TAKE ONE-HALF TABLET IN THE MORNING AND TAKE ONE TABLET IN THE EVENING   mupirocin ointment (BACTROBAN) 2 % as needed.   sodium chloride (OCEAN) 0.65 % nasal spray 2 (two) times daily.   UNABLE TO FIND as needed. Simple saline   No current facility-administered medications for this visit. (Other)     ALLERGIES Allergies  Allergen Reactions   Ace Inhibitors Other (See Comments)    unknown    Aminoglycosides    Erythromycin Nausea And Vomiting   Levofloxacin Other (See Comments)    dehydration   Penicillins Nausea And Vomiting   Sulfa Antibiotics    Ciprofloxacin Rash    hives    PAST MEDICAL HISTORY Past Medical History:  Diagnosis Date   ACNE ROSACEA 09/09/2009   ALLERGIC RHINITIS 02/23/2009   Basal cell carcinoma 06/23/2010   R cheek- (CX35FU+ exc )   COLONIC POLYPS, HX OF 02/23/2009   Convulsions/seizures ([place]) 10/24/2014   DIVERTICULITIS, HX OF 02/23/2009   Ear infection    Family history of heart disease    GERD 02/23/2009   Hyperlipidemia    HYPERTENSION 02/23/2009   PONV (postoperative nausea and vomiting)    Pre-syncope    Thoracic aortic aneurysm ([place])    URINARY INCONTINENCE 02/23/2009   Past Surgical History:  Procedure Laterality Date   APPENDECTOMY  1946   CATARACT EXTRACTION Bilateral    EYE SURGERY Right 07/25/2019   Left ear tube  2001   Removal of pre-cancer bump on neck  2005   Ruptured appendics  1946   Sinus surgery  2008    FAMILY HISTORY Family History  Problem Relation Age of Onset   Heart disease Mother    Stroke Father    Heart disease Father    Cancer Sister    Cancer Brother    Heart disease Other    Stroke Other  Seizures Neg Hx     SOCIAL HISTORY Social History   Tobacco Use   Smoking status: Former   Smokeless tobacco: Never   Tobacco comments:    Lives with spouse-independant in all ADLs. [employer] and [employer] at [employer], Married in "52"  Vaping Use   Vaping Use: Never used  Substance Use Topics   Alcohol use: No   Drug use: No         OPHTHALMIC EXAM:  Base Eye Exam     Visual Acuity (ETDRS)       Right Left   Dist cc 20/30 -2 20/20 -1   Dist ph cc 20/25 -2     Correction: Glasses         Tonometry (Tonopen, 11:08 AM)       Right Left   Pressure 16          Pupils       Pupils APD   Right PERRL None   Left PERRL None         Extraocular Movement        Right Left    Full Full         Neuro/Psych     Oriented x3: Yes   Mood/Affect: Normal         Dilation     Both eyes: No dilation @ 11:06 AM            IMAGING AND PROCEDURES  Imaging and Procedures for '@TODAY'$ @           ASSESSMENT/PLAN:  No diagnosis found.  Ophthalmic Meds Ordered this visit:  Meds ordered this encounter  Medications   prednisoLONE acetate (PRED FORTE) 1 % ophthalmic suspension    Sig: Place 1 drop into the right eye 4 (four) times daily for 21 days.    Dispense:  5 mL    Refill:  0   tobramycin-dexamethasone (TOBRADEX) ophthalmic solution    Sig: Place 1 drop into the right eye in the morning, at noon, in the evening, and at bedtime for 21 days.    Dispense:  5 mL    Refill:  0   tobramycin (TOBREX) 0.3 % ophthalmic ointment    Sig: Place into the right eye 2 (two) times daily for 10 days. Place a 1/2 inch ribbon of ointment into the lower eyelid.    Dispense:  3.5 g    Refill:  0     I was informed that Tobradex erroneously ordered by tech, this will be cancelled, and Tobramycin instead will be ordered     Pre-op completed. Operative consent obtained with pre-op eye drops reviewed with [name] and sent via [facility] as needed. Post op instructions reviewed with patient and per patient all questions answered.  Hurman Horn, MD

## 2021-10-12 NOTE — Addendum Note (Signed)
Addended by: Deloria Lair A on: 10/12/2021 01:45 PM   Modules accepted: Orders, Level of Service

## 2021-10-12 NOTE — Addendum Note (Signed)
Addended by: Deloria Lair A on: 10/12/2021 05:09 PM   Modules accepted: Orders

## 2021-10-13 ENCOUNTER — Encounter (AMBULATORY_SURGERY_CENTER): Payer: Medicare Other | Admitting: Ophthalmology

## 2021-10-13 DIAGNOSIS — H35341 Macular cyst, hole, or pseudohole, right eye: Secondary | ICD-10-CM | POA: Diagnosis not present

## 2021-10-14 ENCOUNTER — Encounter (INDEPENDENT_AMBULATORY_CARE_PROVIDER_SITE_OTHER): Payer: Self-pay | Admitting: Ophthalmology

## 2021-10-14 ENCOUNTER — Ambulatory Visit (INDEPENDENT_AMBULATORY_CARE_PROVIDER_SITE_OTHER): Payer: Medicare Other | Admitting: Ophthalmology

## 2021-10-14 DIAGNOSIS — H35341 Macular cyst, hole, or pseudohole, right eye: Secondary | ICD-10-CM

## 2021-10-14 NOTE — Patient Instructions (Signed)
Tobramycin 4 times daily to the operative eye  Prednisolone acetate 1 drop to the operative eye 4 times daily  Patient instructed not to refill the medications and use them for maximum of 3 weeks.  Patient instructed do not rub the eye.  Patient has the option to use the patch at night.   Macular hole surgery was explained with the need for a gas injection. The patient is aware of proper face down position (like looking downward naturally while  reading a book in a lap) for 3-5 days. Patient was advised to not lay on their back while sleeping or resting, usually for 2 weeks. Do not travel to places of elevation while gas bubble is in the eye, usually for 2 weeks  Patient instructed to maintain use of the green bracelet for as long as the gas bubble is in the eye usually 2 weeks in this case

## 2021-10-14 NOTE — Assessment & Plan Note (Signed)
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

## 2021-10-14 NOTE — Progress Notes (Signed)
10/14/2021     CHIEF COMPLAINT Patient presents for  Chief Complaint  Patient presents with   Post-op Follow-up      HISTORY OF PRESENT ILLNESS: Zachary Padilla is a 88 y.o. male who presents to the clinic today for:   HPI     Post-op Follow-up           Laterality: right eye   Discomfort: Negative for pain, itching, foreign body sensation, tearing, discharge and floaters   Vision: is stable         Comments   1 day post op OD SX 10/13/2021,,   Pt stated vision is stable at the moment. Pt denies floaters, FOL and pain.        Last edited by Hurman Horn, MD on 10/14/2021  8:47 AM.      Referring physician: Lajean Manes, [place]. [address] [place],  [place] [address]  HISTORICAL INFORMATION:   Selected notes from the MEDICAL RECORD NUMBER       CURRENT MEDICATIONS: Current Outpatient Medications (Ophthalmic Drugs)  Medication Sig   prednisoLONE acetate (PRED FORTE) 1 % ophthalmic suspension Place 1 drop into the right eye 4 (four) times daily for 21 days.   tobramycin (TOBREX) 0.3 % ophthalmic solution Place 1 drop into the right eye every 4 (four) hours for 10 days.   No current facility-administered medications for this visit. (Ophthalmic Drugs)   Current Outpatient Medications (Other)  Medication Sig   Azelaic Acid 15 % cream Apply 1 application topically 2 (two) times daily as needed. For Roscacea   cholecalciferol (VITAMIN D3) 25 MCG (1000 UNIT) tablet Take 1,000 Units by mouth daily.   furosemide (LASIX) 20 MG tablet Take 20 mg by mouth daily.   guaiFENesin (MUCINEX) 600 MG 12 hr tablet Take by mouth as needed.   levETIRAcetam (KEPPRA) 500 MG tablet TAKE ONE-HALF TABLET IN THE MORNING AND TAKE ONE TABLET IN THE EVENING   mupirocin ointment (BACTROBAN) 2 % as needed.   sodium chloride (OCEAN) 0.65 % nasal spray 2 (two) times daily.   UNABLE TO FIND as needed. Simple saline   No current facility-administered medications for this visit.  (Other)      REVIEW OF SYSTEMS: ROS   Negative for: Constitutional, Gastrointestinal, Neurological, Skin, Genitourinary, Musculoskeletal, HENT, Endocrine, Cardiovascular, Eyes, Respiratory, Psychiatric, Allergic/Imm, Heme/Lymph Last edited by Hurman Horn, MD on 10/14/2021  8:47 AM.       ALLERGIES Allergies  Allergen Reactions   Ace Inhibitors Other (See Comments)    unknown   Aminoglycosides    Erythromycin Nausea And Vomiting   Levofloxacin Other (See Comments)    dehydration   Penicillins Nausea And Vomiting   Sulfa Antibiotics    Ciprofloxacin Rash    hives    PAST MEDICAL HISTORY Past Medical History:  Diagnosis Date   ACNE ROSACEA 09/09/2009   ALLERGIC RHINITIS 02/23/2009   Basal cell carcinoma 06/23/2010   R cheek- (CX35FU+ exc )   COLONIC POLYPS, HX OF 02/23/2009   Convulsions/seizures ([place]) 10/24/2014   DIVERTICULITIS, HX OF 02/23/2009   Ear infection    Family history of heart disease    GERD 02/23/2009   Hyperlipidemia    HYPERTENSION 02/23/2009   PONV (postoperative nausea and vomiting)    Posterior vitreous detachment of right eye 08/26/2019   Pre-syncope    Thoracic aortic aneurysm ([place])    URINARY INCONTINENCE 02/23/2009   Past Surgical History:  Procedure Laterality Date  APPENDECTOMY  1946   CATARACT EXTRACTION Bilateral    EYE SURGERY Right 07/25/2019   Left ear tube  2001   Removal of pre-cancer bump on neck  2005   Ruptured appendics  1946   Sinus surgery  2008    FAMILY HISTORY Family History  Problem Relation Age of Onset   Heart disease Mother    Stroke Father    Heart disease Father    Cancer Sister    Cancer Brother    Heart disease Other    Stroke Other    Seizures Neg Hx     SOCIAL HISTORY Social History   Tobacco Use   Smoking status: Former   Smokeless tobacco: Never   Tobacco comments:    Lives with spouse-independant in all ADLs. [employer] and [employer] at [employer], Married in "52"   Vaping Use   Vaping Use: Never used  Substance Use Topics   Alcohol use: No   Drug use: No         OPHTHALMIC EXAM:  Base Eye Exam     Visual Acuity (ETDRS)       Right Left   Dist [place] CF at 3' 20/20         Tonometry (Tonopen, 8:42 AM)       Right Left   Pressure 16 18         Pupils       Pupils APD   Right PERRL None   Left PERRL None         Visual Fields       Left Right    Full Full         Extraocular Movement       Right Left    Full Full         Neuro/Psych     Oriented x3: Yes   Mood/Affect: Normal         Dilation     Right eye: 2.5% Phenylephrine, 1.0% Mydriacyl @ 8:42 AM           Slit Lamp and Fundus Exam     External Exam       Right Left   External Normal Normal         Slit Lamp Exam       Right Left   Lids/Lashes Normal, trichiasis temporally, 3-4 small thin lashes abrading the conjunctival surface Normal   Conjunctiva/Sclera Zimmers and quiet [place]sch and quiet   Cornea  , Debris in tear film completely resolved, epithelium and cornea are now clear Clear   Anterior Chamber Deep and quiet, no cells Deep and quiet   Iris Round and reactive Round and reactive   Lens Clear Clear   Anterior Vitreous Normal Normal         Fundus Exam       Right Left   Posterior Vitreous Vitrectomized, 75 to 80% gas    Disc Normal    C/D Ratio 0.5    Macula Macula attached, no details of the small hole, through the gas    Vessels Normal    Periphery Normal, 25 and 28 D exam             IMAGING AND PROCEDURES  Imaging and Procedures for 10/14/21           ASSESSMENT/PLAN:  Macular hole of right eye Postop day #1 vitrectomy membrane peel gas injection SF6 8%, for 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 Macular hole of right eye  H35.341       1.  OD postop day #1 vitrectomy membrane peel for repair macular hole.  Looks great  today.  2.  3.  Ophthalmic Meds Ordered this visit:  No orders of the defined types were placed in this encounter.      Return for dilate, OD, POST OP, OCT.  Patient Instructions  Tobramycin 4 times daily to the operative eye  Prednisolone acetate 1 drop to the operative eye 4 times daily  Patient instructed not to refill the medications and use them for maximum of 3 weeks.  Patient instructed do not rub the eye.  Patient has the option to use the patch at night.   Macular hole surgery was explained with the need for a gas injection. The patient is aware of proper face down position (like looking downward naturally while  reading a book in a lap) for 3-5 days. Patient was advised to not lay on their back while sleeping or resting, usually for 2 weeks. Do not travel to places of elevation while gas bubble is in the eye, usually for 2 weeks  Patient instructed to maintain use of the green bracelet for as long as the gas bubble is in the eye usually 2 weeks in this case     Explained the diagnoses, plan, and follow up with the patient and they expressed understanding.  Patient expressed understanding of the importance of proper follow up care.   Clent Demark Caia Lofaro M.D. Diseases & Surgery of the Retina and Vitreous [facility]ina [facility] Diabetic [place] 10/14/21     Abbreviations: M myopia (nearsighted); A astigmatism; H hyperopia (farsighted); P presbyopia; Mrx spectacle prescription;  CTL contact lenses; OD right eye; OS left eye; OU both eyes  XT exotropia; ET esotropia; PEK punctate epithelial keratitis; PEE punctate epithelial erosions; DES dry eye syndrome; MGD meibomian gland dysfunction; ATs artificial tears; PFAT's preservative free artificial tears; [place] nuclear sclerotic cataract; PSC posterior subcapsular cataract; ERM epi-retinal membrane; PVD posterior vitreous detachment; RD retinal detachment; DM diabetes mellitus; DR diabetic retinopathy; NPDR non-proliferative diabetic  retinopathy; PDR proliferative diabetic retinopathy; CSME clinically significant macular edema; DME diabetic macular edema; dbh dot blot hemorrhages; CWS cotton wool spot; POAG primary open angle glaucoma; C/D cup-to-disc ratio; HVF humphrey visual field; GVF goldmann visual field; OCT optical coherence tomography; IOP intraocular pressure; BRVO Branch retinal vein occlusion; CRVO central retinal vein occlusion; CRAO central retinal artery occlusion; BRAO branch retinal artery occlusion; RT retinal tear; SB scleral buckle; PPV pars plana vitrectomy; VH Vitreous hemorrhage; PRP panretinal laser photocoagulation; IVK intravitreal kenalog; VMT vitreomacular traction; MH Macular hole;  NVD neovascularization of the disc; NVE neovascularization elsewhere; AREDS age related eye disease study; ARMD age related macular degeneration; POAG primary open angle glaucoma; EBMD epithelial/anterior basement membrane dystrophy; ACIOL anterior chamber intraocular lens; IOL intraocular lens; PCIOL posterior chamber intraocular lens; Phaco/IOL phacoemulsification with intraocular lens placement; Melbeta photorefractive keratectomy; LASIK laser assisted in situ keratomileusis; HTN hypertension; DM diabetes mellitus; COPD chronic obstructive pulmonary disease

## 2021-10-19 ENCOUNTER — Encounter (INDEPENDENT_AMBULATORY_CARE_PROVIDER_SITE_OTHER): Payer: Medicare Other | Admitting: Ophthalmology

## 2021-10-20 NOTE — Telephone Encounter (Signed)
Phone call from patient regarding the [place]. He has to have some eye surgery. Told patient Speak to the eye doctor and he can reschedule the MOHS if the eye surgery is more important.

## 2021-10-21 ENCOUNTER — Ambulatory Visit (INDEPENDENT_AMBULATORY_CARE_PROVIDER_SITE_OTHER): Payer: Medicare Other | Admitting: Ophthalmology

## 2021-10-21 ENCOUNTER — Encounter (INDEPENDENT_AMBULATORY_CARE_PROVIDER_SITE_OTHER): Payer: Self-pay | Admitting: Ophthalmology

## 2021-10-21 DIAGNOSIS — H35371 Puckering of macula, right eye: Secondary | ICD-10-CM

## 2021-10-21 DIAGNOSIS — H35341 Macular cyst, hole, or pseudohole, right eye: Secondary | ICD-10-CM | POA: Diagnosis not present

## 2021-10-21 NOTE — Patient Instructions (Signed)
Tobramycin 4 times daily to the operative eye  Prednisolone acetate 1 drop to the operative eye 4 times daily  Patient instructed not to refill the medications and use them for maximum of 3 weeks.  Patient instructed do not rub the eye.  Patient has the option to use the patch at night.   Macular hole surgery was explained with the need for a gas injection. The patient is aware of proper face down position (like looking downward naturally while  reading a book in a lap) for 3-5 days. Patient was advised to not lay on their back while sleeping or resting, usually for 2 weeks. Do not travel to places of elevation while gas bubble is in the eye, usually for 2 weeks  Patient instructed to maintain use of the green bracelet for as long as the gas bubble is in the eye usually 2 weeks in this case   Patient no longer required to reading position or prayer position but cannot sleep or lay on his back for 1 more week

## 2021-10-21 NOTE — Assessment & Plan Note (Signed)
Resolved post vitrectomy 6- 11-2021

## 2021-10-21 NOTE — Assessment & Plan Note (Signed)
OD macular hole now closed 1 week post vitrectomy ILM peel

## 2021-10-21 NOTE — Progress Notes (Signed)
10/21/2021     CHIEF COMPLAINT Patient presents for  Chief Complaint  Patient presents with   Post-op Follow-up      HISTORY OF PRESENT ILLNESS: Zachary Padilla is a 88 y.o. male who presents to the clinic today for:   HPI     Post-op Follow-up           Discomfort: pain, itching and floaters.  Negative for foreign body sensation, tearing, discharge and none   Vision: is stable         Comments   1 weeks POST OP OD SX 10/13/21 "The bubble has become quite predominant. It has gone down a lot. I would say it has gone down about 50%. If I close my left eye, I can see a change in my right eye but it has improved." Pt denies FOL but sees a "fleeting of floaters." Pt has an upcoming procedure on June 22nd and would like to get Dr. Dahlia Bailiff opinion if she should go forward with the procedure.  Pt has allergy to CIPRO.       Last edited by Silvestre Moment on 10/21/2021 11:13 AM.      Referring physician: Lajean Manes, MD [address] [place],  [place] [address]  HISTORICAL INFORMATION:   Selected notes from the MEDICAL RECORD NUMBER       CURRENT MEDICATIONS: Current Outpatient Medications (Ophthalmic Drugs)  Medication Sig   prednisoLONE acetate (PRED FORTE) 1 % ophthalmic suspension Place 1 drop into the right eye 4 (four) times daily for 21 days.   tobramycin (TOBREX) 0.3 % ophthalmic solution Place 1 drop into the right eye every 4 (four) hours for 10 days.   No current facility-administered medications for this visit. (Ophthalmic Drugs)   Current Outpatient Medications (Other)  Medication Sig   Azelaic Acid 15 % cream Apply 1 application topically 2 (two) times daily as needed. For Roscacea   cholecalciferol (VITAMIN D3) 25 MCG (1000 UNIT) tablet Take 1,000 Units by mouth daily.   furosemide (LASIX) 20 MG tablet Take 20 mg by mouth daily.   guaiFENesin (MUCINEX) 600 MG 12 hr tablet Take by mouth as needed.   levETIRAcetam (KEPPRA) 500 MG tablet TAKE  ONE-HALF TABLET IN THE MORNING AND TAKE ONE TABLET IN THE EVENING   mupirocin ointment (BACTROBAN) 2 % as needed.   sodium chloride (OCEAN) 0.65 % nasal spray 2 (two) times daily.   UNABLE TO FIND as needed. Simple saline   No current facility-administered medications for this visit. (Other)      REVIEW OF SYSTEMS: ROS   Negative for: Constitutional, Gastrointestinal, Neurological, Skin, Genitourinary, Musculoskeletal, HENT, Endocrine, Cardiovascular, Eyes, Respiratory, Psychiatric, Allergic/Imm, Heme/Lymph Last edited by Silvestre Moment on 10/21/2021 11:06 AM.       ALLERGIES Allergies  Allergen Reactions   Ace Inhibitors Other (See Comments)    unknown   Aminoglycosides    Erythromycin Nausea And Vomiting   Levofloxacin Other (See Comments)    dehydration   Penicillins Nausea And Vomiting   Sulfa Antibiotics    Ciprofloxacin Rash    hives    PAST MEDICAL HISTORY Past Medical History:  Diagnosis Date   ACNE ROSACEA 09/09/2009   ALLERGIC RHINITIS 02/23/2009   Basal cell carcinoma 06/23/2010   R cheek- (CX35FU+ exc )   COLONIC POLYPS, HX OF 02/23/2009   Convulsions/seizures ([place]) 10/24/2014   DIVERTICULITIS, HX OF 02/23/2009   Ear infection    Family history of heart disease  GERD 02/23/2009   Hyperlipidemia    HYPERTENSION 02/23/2009   PONV (postoperative nausea and vomiting)    Posterior vitreous detachment of right eye 08/26/2019   Pre-syncope    Thoracic aortic aneurysm ([place])    URINARY INCONTINENCE 02/23/2009   Past Surgical History:  Procedure Laterality Date   APPENDECTOMY  1946   CATARACT EXTRACTION Bilateral    EYE SURGERY Right 07/25/2019   Left ear tube  2001   Removal of pre-cancer bump on neck  2005   Ruptured appendics  1946   Sinus surgery  2008    FAMILY HISTORY Family History  Problem Relation Age of Onset   Heart disease Mother    Stroke Father    Heart disease Father    Cancer Sister    Cancer Brother    Heart disease Other    Stroke  Other    Seizures Neg Hx     SOCIAL HISTORY Social History   Tobacco Use   Smoking status: Former   Smokeless tobacco: Never   Tobacco comments:    Lives with spouse-independant in all ADLs. [employer] and [employer] at [employer], Married in "52"  Vaping Use   Vaping Use: Never used  Substance Use Topics   Alcohol use: No   Drug use: No         OPHTHALMIC EXAM:  Base Eye Exam     Visual Acuity (ETDRS)       Right Left   Dist cc 20/40 -1 20/20   Dist ph cc 20/30 -2     Correction: Glasses         Tonometry (Tonopen, 11:15 AM)       Right Left   Pressure 14 17         Pupils       Pupils APD   Right PERRL None   Left PERRL None         Visual Fields       Left Right    Full          Extraocular Movement       Right Left    Full Full         Neuro/Psych     Oriented x3: Yes   Mood/Affect: Normal         Dilation     Right eye: 2.5% Phenylephrine, 1.0% Mydriacyl @ 11:15 AM           Slit Lamp and Fundus Exam     External Exam       Right Left   External Normal Normal         Slit Lamp Exam       Right Left   Lids/Lashes Normal, trichiasis temporally, 3-4 small thin lashes abrading the conjunctival surface Normal   Conjunctiva/Sclera [place] and quiet [place] and quiet   Cornea  , Debris in tear film completely resolved, epithelium and cornea are now clear Clear   Anterior Chamber Deep and quiet, no cells Deep and quiet   Iris Round and reactive Round and reactive   Lens Clear Clear   Anterior Vitreous Normal Normal         Fundus Exam       Right Left   Posterior Vitreous Vitrectomized, 40% gas    Disc Normal    C/D Ratio 0.5    Macula Negative Watzke, hole closed retina attached    Vessels Normal    Periphery Normal  IMAGING AND PROCEDURES  Imaging and Procedures for 10/21/21  OCT, Retina - OU - Both Eyes       Right Eye Quality was good. Scan locations included  subfoveal. Central Foveal Thickness: 356. Progression has been stable.   Left Eye Quality was good. Scan locations included subfoveal. Central Foveal Thickness: 266. Progression has been stable. Findings include cystoid macular edema.   Notes Minor remnant nasal, active foveal distortion OD, resolving 1 week post ILM removal via vitrectomy gas injection    OD 1 week post vitrectomy ILM peel to close macular hole which is developed on a tangential basis.  Macular hole now closed               ASSESSMENT/PLAN:  Macular hole of right eye OD macular hole now closed 1 week post vitrectomy ILM peel  Right epiretinal membrane Resolved post vitrectomy 6- 11-2021      ICD-10-CM   1. Macular hole of right eye  H35.341 OCT, Retina - OU - Both Eyes    2. Right epiretinal membrane  H35.371       1.  OD, continue topical medications each bottle 4 times daily.  Do not refill the medications.  If they are completed prior to 3 weeks total from surgery (that is 2 weeks from now), do not refill the medications  2.  3.  Ophthalmic Meds Ordered this visit:  No orders of the defined types were placed in this encounter.      Return in about 4 weeks (around 11/18/2021) for dilate, OD, POST OP, OCT.  Patient Instructions  Tobramycin 4 times daily to the operative eye  Prednisolone acetate 1 drop to the operative eye 4 times daily  Patient instructed not to refill the medications and use them for maximum of 3 weeks.  Patient instructed do not rub the eye.  Patient has the option to use the patch at night.   Macular hole surgery was explained with the need for a gas injection. The patient is aware of proper face down position (like looking downward naturally while  reading a book in a lap) for 3-5 days. Patient was advised to not lay on their back while sleeping or resting, usually for 2 weeks. Do not travel to places of elevation while gas bubble is in the eye, usually for 2  weeks  Patient instructed to maintain use of the green bracelet for as long as the gas bubble is in the eye usually 2 weeks in this case   Patient no longer required to reading position or prayer position but cannot sleep or lay on his back for 1 more week   Explained the diagnoses, plan, and follow up with the patient and they expressed understanding.  Patient expressed understanding of the importance of proper follow up care.   Clent Demark Alexiz Sustaita M.D. Diseases & Surgery of the Retina and Vitreous Retina & Diabetic [place] 10/21/21     Abbreviations: M myopia (nearsighted); A astigmatism; H hyperopia (farsighted); P presbyopia; Mrx spectacle prescription;  CTL contact lenses; OD right eye; OS left eye; OU both eyes  XT exotropia; ET esotropia; PEK punctate epithelial keratitis; PEE punctate epithelial erosions; DES dry eye syndrome; MGD meibomian gland dysfunction; ATs artificial tears; PFAT's preservative free artificial tears; [place] nuclear sclerotic cataract; PSC posterior subcapsular cataract; ERM epi-retinal membrane; PVD posterior vitreous detachment; RD retinal detachment; DM diabetes mellitus; DR diabetic retinopathy; NPDR non-proliferative diabetic retinopathy; PDR proliferative diabetic retinopathy; CSME clinically significant macular edema; DME  diabetic macular edema; dbh dot blot hemorrhages; CWS cotton wool spot; POAG primary open angle glaucoma; C/D cup-to-disc ratio; HVF humphrey visual field; GVF goldmann visual field; OCT optical coherence tomography; IOP intraocular pressure; BRVO Branch retinal vein occlusion; CRVO central retinal vein occlusion; CRAO central retinal artery occlusion; BRAO branch retinal artery occlusion; RT retinal tear; SB scleral buckle; PPV pars plana vitrectomy; VH Vitreous hemorrhage; PRP panretinal laser photocoagulation; IVK intravitreal kenalog; VMT vitreomacular traction; MH Macular hole;  NVD neovascularization of the disc; NVE neovascularization  elsewhere; AREDS age related eye disease study; ARMD age related macular degeneration; POAG primary open angle glaucoma; EBMD epithelial/anterior basement membrane dystrophy; ACIOL anterior chamber intraocular lens; IOL intraocular lens; PCIOL posterior chamber intraocular lens; Phaco/IOL phacoemulsification with intraocular lens placement; [place] photorefractive keratectomy; LASIK laser assisted in situ keratomileusis; HTN hypertension; DM diabetes mellitus; COPD chronic obstructive pulmonary disease

## 2021-10-27 ENCOUNTER — Telehealth: Payer: Self-pay | Admitting: Dermatology

## 2021-10-27 ENCOUNTER — Ambulatory Visit (INDEPENDENT_AMBULATORY_CARE_PROVIDER_SITE_OTHER): Payer: Medicare Other | Admitting: Ophthalmology

## 2021-10-27 ENCOUNTER — Encounter (INDEPENDENT_AMBULATORY_CARE_PROVIDER_SITE_OTHER): Payer: Self-pay | Admitting: Ophthalmology

## 2021-10-27 DIAGNOSIS — H35371 Puckering of macula, right eye: Secondary | ICD-10-CM

## 2021-10-27 DIAGNOSIS — H35341 Macular cyst, hole, or pseudohole, right eye: Secondary | ICD-10-CM

## 2021-10-27 DIAGNOSIS — H35372 Puckering of macula, left eye: Secondary | ICD-10-CM

## 2021-10-27 NOTE — Telephone Encounter (Signed)
Patient is calling to speak with someone about his moh's appointment and only wants to speak with nurse (in 10 minutes).

## 2021-10-27 NOTE — Assessment & Plan Note (Signed)
Doing very well some 2 weeks post vitrectomy

## 2021-10-27 NOTE — Telephone Encounter (Signed)
Left message for patient to return our phone call.

## 2021-10-27 NOTE — Progress Notes (Signed)
10/27/2021     CHIEF COMPLAINT Patient presents for  Chief Complaint  Patient presents with   Post-op Follow-up      HISTORY OF PRESENT ILLNESS: Zachary Padilla is a 88 y.o. male who presents to the clinic today for:   HPI     Post-op Follow-up           Laterality: right eye   Discomfort: itching and floaters.  Negative for tearing and discharge   Vision: is improved         Comments   WIP- OD OCT POST OP SX 10/13/21. Pt stated, "the bubble is still there. There was a point in time where it looked like the end of a tied up balloon was cut off but then it would start to disappear. It would get smaller and it would look like a floater. But then it would go away. I can tell the vision is getting better but the right eye is still weaker than the left."       Last edited by Silvestre Moment on 10/27/2021  2:52 PM.      Referring physician: Lajean Manes, [address] [place],  [place] [address]  HISTORICAL INFORMATION:   Selected notes from the MEDICAL RECORD NUMBER       CURRENT MEDICATIONS: Current Outpatient Medications (Ophthalmic Drugs)  Medication Sig   prednisoLONE acetate (PRED FORTE) 1 % ophthalmic suspension Place 1 drop into the right eye 4 (four) times daily for 21 days.   No current facility-administered medications for this visit. (Ophthalmic Drugs)   Current Outpatient Medications (Other)  Medication Sig   Azelaic Acid 15 % cream Apply 1 application topically 2 (two) times daily as needed. For Roscacea   cholecalciferol (VITAMIN D3) 25 MCG (1000 UNIT) tablet Take 1,000 Units by mouth daily.   furosemide (LASIX) 20 MG tablet Take 20 mg by mouth daily.   guaiFENesin (MUCINEX) 600 MG 12 hr tablet Take by mouth as needed.   levETIRAcetam (KEPPRA) 500 MG tablet TAKE ONE-HALF TABLET IN THE MORNING AND TAKE ONE TABLET IN THE EVENING   mupirocin ointment (BACTROBAN) 2 % as needed.   sodium chloride (OCEAN) 0.65 % nasal spray 2 (two) times daily.    UNABLE TO FIND as needed. Simple saline   No current facility-administered medications for this visit. (Other)      REVIEW OF SYSTEMS: ROS   Negative for: Constitutional, Gastrointestinal, Neurological, Skin, Genitourinary, Musculoskeletal, HENT, Endocrine, Cardiovascular, Eyes, Respiratory, Psychiatric, Allergic/Imm, Heme/Lymph Last edited by Silvestre Moment on 10/27/2021  2:52 PM.       ALLERGIES Allergies  Allergen Reactions   Ace Inhibitors Other (See Comments)    unknown   Aminoglycosides    Erythromycin Nausea And Vomiting   Levofloxacin Other (See Comments)    dehydration   Penicillins Nausea And Vomiting   Sulfa Antibiotics    Ciprofloxacin Rash    hives    PAST MEDICAL HISTORY Past Medical History:  Diagnosis Date   ACNE ROSACEA 09/09/2009   ALLERGIC RHINITIS 02/23/2009   Basal cell carcinoma 06/23/2010   R cheek- (CX35FU+ exc )   COLONIC POLYPS, HX OF 02/23/2009   Convulsions/seizures ([place]) 10/24/2014   DIVERTICULITIS, HX OF 02/23/2009   Ear infection    Family history of heart disease    GERD 02/23/2009   Hyperlipidemia    HYPERTENSION 02/23/2009   PONV (postoperative nausea and vomiting)    Posterior vitreous detachment of right eye 08/26/2019   Pre-syncope  Thoracic aortic aneurysm ([place])    URINARY INCONTINENCE 02/23/2009   Past Surgical History:  Procedure Laterality Date   APPENDECTOMY  1946   CATARACT EXTRACTION Bilateral    EYE SURGERY Right 07/25/2019   Left ear tube  2001   Removal of pre-cancer bump on neck  2005   Ruptured appendics  1946   Sinus surgery  2008    FAMILY HISTORY Family History  Problem Relation Age of Onset   Heart disease Mother    Stroke Father    Heart disease Father    Cancer Sister    Cancer Brother    Heart disease Other    Stroke Other    Seizures Neg Hx     SOCIAL HISTORY Social History   Tobacco Use   Smoking status: Former   Smokeless tobacco: Never   Tobacco comments:    Lives with  spouse-independant in all ADLs. [employer] and [employer] at [employer], Married in "52"  Vaping Use   Vaping Use: Never used  Substance Use Topics   Alcohol use: No   Drug use: No         OPHTHALMIC EXAM:  Base Eye Exam     Visual Acuity (ETDRS)       Right Left   Dist cc 20/40 -2 20/20   Dist ph cc NI     Correction: Glasses         Tonometry (Tonopen, 2:57 PM)       Right Left   Pressure 21 20         Pupils       Pupils APD   Right PERRL None   Left PERRL None         Visual Fields       Left Right    Full Full         Extraocular Movement       Right Left    Full Full         Neuro/Psych     Oriented x3: Yes   Mood/Affect: Normal         Dilation     Right eye: 2.5% Phenylephrine, 1.0% Mydriacyl @ 2:57 PM           Slit Lamp and Fundus Exam     External Exam       Right Left   External Normal Normal         Slit Lamp Exam       Right Left   Lids/Lashes Normal, trichiasis temporally, 3-4 small thin lashes abrading the conjunctival surface Normal   Conjunctiva/Sclera [place] and quiet [place] and quiet   Cornea  , Debris in tear film completely resolved, epithelium and cornea are now clear Clear   Anterior Chamber Deep and quiet, no cells Deep and quiet   Iris Round and reactive Round and reactive   Lens Clear Clear   Anterior Vitreous Normal Normal         Fundus Exam       Right Left   Posterior Vitreous Vitrectomized, 1% gas    Disc Normal    C/D Ratio 0.5    Macula Negative Watzke, hole closed retina attached    Vessels Normal    Periphery Normal             IMAGING AND PROCEDURES  Imaging and Procedures for 10/27/21  OCT, Retina - OU - Both Eyes       Right Eye Quality  was good. Scan locations included subfoveal. Central Foveal Thickness: 350. Progression has been stable. Findings include abnormal foveal contour.   Left Eye Quality was good. Scan locations included  subfoveal. Central Foveal Thickness: 266. Progression has been stable. Findings include cystoid macular edema.   Notes  Macular thickening continues to resolve 3 week post ILM removal via vitrectomy gas injection    OD 3 Feeder symptomatically doing Margaree Mackintosh Week post vitrectomy ILM peel to close macular hole which is developed on a tangential basis.  Macular hole now closed               ASSESSMENT/PLAN:  Macular hole of right eye OD looks great, much less thickening, improving acuity.  Outer retinal hole now improved substantially.  Notably there is no gas appreciable in the back that I less than 1%.  Patient okay to lay in a recumbent position for any reason including upcoming cancer surgery on skin  Right epiretinal membrane Doing very well some 2 weeks post vitrectomy      ICD-10-CM   1. Macular hole of right eye  H35.341 OCT, Retina - OU - Both Eyes    2. Macular pucker, left eye  H35.372     3. Right epiretinal membrane  H35.371       Looks great OD,, continue topical medicines each twice daily for only 7 more days, do not refill the medication  2.  May resume total activity and resume when lying on her, but that is supine on his back fashion  3.  Ophthalmic Meds Ordered this visit:  No orders of the defined types were placed in this encounter.      Return in about 9 weeks (around 12/29/2021) for dilate, OD, OCT.  Patient Instructions  Taper topical medications  Ofloxacin tan top, 1 drop right eye twice daily for 7 more days  Prednisolone acetate, pink or [place] top, 1 drop right eye twice daily for 7 more days   do not refill either medication  Okay to position in any position including lying flat on back   Explained the diagnoses, plan, and follow up with the patient and they expressed understanding.  Patient expressed understanding of the importance of proper follow up care.   Clent Demark Merari Pion M.D. Diseases & Surgery of the Retina and  Vitreous Retina & Diabetic [place] 10/27/21     Abbreviations: M myopia (nearsighted); A astigmatism; H hyperopia (farsighted); P presbyopia; Mrx spectacle prescription;  CTL contact lenses; OD right eye; OS left eye; OU both eyes  XT exotropia; ET esotropia; PEK punctate epithelial keratitis; PEE punctate epithelial erosions; DES dry eye syndrome; MGD meibomian gland dysfunction; ATs artificial tears; PFAT's preservative free artificial tears; [place] nuclear sclerotic cataract; PSC posterior subcapsular cataract; ERM epi-retinal membrane; PVD posterior vitreous detachment; RD retinal detachment; DM diabetes mellitus; DR diabetic retinopathy; NPDR non-proliferative diabetic retinopathy; PDR proliferative diabetic retinopathy; CSME clinically significant macular edema; DME diabetic macular edema; dbh dot blot hemorrhages; CWS cotton wool spot; POAG primary open angle glaucoma; C/D cup-to-disc ratio; HVF humphrey visual field; GVF goldmann visual field; OCT optical coherence tomography; IOP intraocular pressure; BRVO Branch retinal vein occlusion; CRVO central retinal vein occlusion; CRAO central retinal artery occlusion; BRAO branch retinal artery occlusion; RT retinal tear; SB scleral buckle; PPV pars plana vitrectomy; VH Vitreous hemorrhage; PRP panretinal laser photocoagulation; IVK intravitreal kenalog; VMT vitreomacular traction; MH Macular hole;  NVD neovascularization of the disc; NVE neovascularization elsewhere; AREDS age related eye disease study; ARMD age related  macular degeneration; POAG primary open angle glaucoma; EBMD epithelial/anterior basement membrane dystrophy; ACIOL anterior chamber intraocular lens; IOL intraocular lens; PCIOL posterior chamber intraocular lens; Phaco/IOL phacoemulsification with intraocular lens placement; [place] photorefractive keratectomy; LASIK laser assisted in situ keratomileusis; HTN hypertension; DM diabetes mellitus; COPD chronic obstructive pulmonary disease

## 2021-10-27 NOTE — Patient Instructions (Signed)
Taper topical medications  Ofloxacin tan top, 1 drop right eye twice daily for 7 more days  Prednisolone acetate, pink or [place] top, 1 drop right eye twice daily for 7 more days   do not refill either medication  Okay to position in any position including lying flat on back

## 2021-10-27 NOTE — Assessment & Plan Note (Deleted)
Improved post vitrectomy membrane peel for ILM induced macular hole

## 2021-10-27 NOTE — Assessment & Plan Note (Signed)
OD looks great, much less thickening, improving acuity.  Outer retinal hole now improved substantially.  Notably there is no gas appreciable in the back that I less than 1%.  Patient okay to lay in a recumbent position for any reason including upcoming cancer surgery on skin

## 2021-11-08 ENCOUNTER — Encounter (INDEPENDENT_AMBULATORY_CARE_PROVIDER_SITE_OTHER): Payer: Medicare Other | Admitting: Ophthalmology

## 2021-11-16 ENCOUNTER — Encounter: Payer: Self-pay | Admitting: Cardiovascular Disease

## 2021-11-16 ENCOUNTER — Ambulatory Visit (INDEPENDENT_AMBULATORY_CARE_PROVIDER_SITE_OTHER): Payer: Medicare Other | Admitting: Cardiovascular Disease

## 2021-11-16 DIAGNOSIS — R600 Localized edema: Secondary | ICD-10-CM

## 2021-11-16 DIAGNOSIS — E782 Mixed hyperlipidemia: Secondary | ICD-10-CM | POA: Diagnosis not present

## 2021-11-16 DIAGNOSIS — I340 Nonrheumatic mitral (valve) insufficiency: Secondary | ICD-10-CM | POA: Diagnosis not present

## 2021-11-16 DIAGNOSIS — I7121 Aneurysm of the ascending aorta, without rupture: Secondary | ICD-10-CM

## 2021-11-16 DIAGNOSIS — I10 Essential (primary) hypertension: Secondary | ICD-10-CM

## 2021-11-16 NOTE — Assessment & Plan Note (Signed)
History of small ascending thoracic aortic aneurysm measuring 42 mm by recent 2D echo performed 06/09/2021.  Given its relatively small size and his advanced age I do not think this needs to be further followed in the future.

## 2021-11-16 NOTE — Progress Notes (Signed)
11/16/2021 Zachary Padilla   09/09/1928  [id number]  Primary Physician Lajean Manes, MD Primary Cardiologist: Lorretta Harp MD FACP, [place], [place], [place]  HPI:  Zachary Padilla is a 88 y.o.  thin and fit appearing married Caucasian male father of 2, grandfather 4 grandchildren who I saw last saw in the office 07/06/2021.Marland Kitchen He has a history of hypertension, mild hyperlipidemia and family history of heart disease as well as chronic right bundle branch block. His mother died of an MI at age 63. A Myoview stress test performed 01/19/12 was entirely normal. He denies chest pain or shortness of breath. A chest CT angiogram was performed 03/22/12 for evaluation of potential lung cancer did incidentally reveal a 4.3 cm descending thoracic aortic aneurysm which he has been asymptomatic fromthis was rechecked April of this year and was unchanged. He denies chest pain or shortness of breath. His most recent CT scan performed 11/18/15 revealed his ascending aneurysm measuring 4.3  cm at its maximum dimension, unchanged from prior studies.     Since I saw him 6 months ago he remained stable.    He denies chest pain or shortness of breath.  His recent 2D echo performed 06/09/2021 revealed normal LV systolic function with moderate MR and a ascending thoracic aorta measuring 42 mm which is stable.  He has seen several of our APP's in the interim, Florence Canner, NP and Coletta Memos, FNP for lower extremity edema which has remained stable on low-dose diuretics.  He denies chest pain or shortness of breath.   Current Meds  Medication Sig   Azelaic Acid 15 % cream Apply 1 application topically 2 (two) times daily as needed. For Roscacea   cholecalciferol (VITAMIN D3) 25 MCG (1000 UNIT) tablet Take 1,000 Units by mouth daily.   furosemide (LASIX) 20 MG tablet Take 20 mg by mouth daily.   guaiFENesin (MUCINEX) 600 MG 12 hr tablet Take by mouth as needed.   levETIRAcetam (KEPPRA) 500 MG tablet TAKE ONE-HALF TABLET IN THE  MORNING AND TAKE ONE TABLET IN THE EVENING   mupirocin ointment (BACTROBAN) 2 % as needed.   ofloxacin (OCUFLOX) 0.3 % ophthalmic solution 1 drop 2 (two) times daily.   sodium chloride (OCEAN) 0.65 % nasal spray 2 (two) times daily.   UNABLE TO FIND as needed. Simple saline     Allergies  Allergen Reactions   Ace Inhibitors Other (See Comments)    unknown   Aminoglycosides    Erythromycin Nausea And Vomiting   Levofloxacin Other (See Comments)    dehydration   Penicillins Nausea And Vomiting   Sulfa Antibiotics    Ciprofloxacin Rash    hives    Social History   Socioeconomic History   Marital status: Married    Spouse name: Not on file   Number of children: 2   Years of education: 15   Highest education level: Not on file  Occupational History   Occupation: retired  Tobacco Use   Smoking status: Former   Smokeless tobacco: Never   Tobacco comments:    Lives with spouse-independant in all ADLs. [employer] and [employer] at [employer], Married in "52"  Vaping Use   Vaping Use: Never used  Substance and Sexual Activity   Alcohol use: No   Drug use: No   Sexual activity: Yes  Other Topics Concern   Not on file  Social History Narrative   Patient occasionally drinks tea.   Patient is right handed.  Social Determinants of Health   Financial Resource Strain: Not on file  Food Insecurity: Not on file  Transportation Needs: Not on file  Physical Activity: Not on file  Stress: Not on file  Social Connections: Not on file  Intimate Partner Violence: Not on file     Review of Systems: General: negative for chills, fever, night sweats or weight changes.  Cardiovascular: negative for chest pain, dyspnea on exertion, edema, orthopnea, palpitations, paroxysmal nocturnal dyspnea or shortness of breath Dermatological: negative for rash Respiratory: negative for cough or wheezing Urologic: negative for hematuria Abdominal: negative for nausea, vomiting,  diarrhea, bright red blood per rectum, melena, or hematemesis Neurologic: negative for visual changes, syncope, or dizziness All other systems reviewed and are otherwise negative except as noted above.    Blood pressure 132/79, pulse 70, height '5\' 10"'$  (1.778 m), weight 159 lb 6.4 oz (72.3 kg), SpO2 98 %.  General appearance: alert and no distress Neck: no adenopathy, no carotid bruit, no JVD, supple, symmetrical, trachea midline, and thyroid not enlarged, symmetric, no tenderness/mass/nodules Lungs: clear to auscultation bilaterally Heart: regular rate and rhythm, S1, S2 normal, no murmur, click, rub or gallop Extremities: extremities normal, atraumatic, no cyanosis or edema Pulses: 2+ and symmetric Skin: Skin color, texture, turgor normal. No rashes or lesions Neurologic: Grossly normal  EKG not performed today  ASSESSMENT AND PLAN:   Essential hypertension History of essential hypertension a blood pressure measured today at 132/79.  He is currently not on antihypertensive medications.  Hyperlipidemia History of hyperlipidemia not on statin therapy followed by his PCP  Thoracic aortic aneurysm [facility]) History of small ascending thoracic aortic aneurysm measuring 42 mm by recent 2D echo performed 06/09/2021.  Given its relatively small size and his advanced age I do not think this needs to be further followed in the future.  Moderate mitral regurgitation Moderate mitral regurgitation on 2D echo performed 06/09/2021 which has remained stable.  He is asymptomatic from this.  Lower extremity edema Chronic lower extremity edema which has remained stable.  He is on low-dose furosemide.  Serum creatinine is in the 1.4 range followed by Dr. Marval Regal, his nephrologist.  He has no edema on the right and 1+ on the left.     Lorretta Harp MD FACP,FACC,FAHA, [facility] 11/16/2021 10:22 AM

## 2021-11-16 NOTE — Assessment & Plan Note (Signed)
Chronic lower extremity edema which has remained stable.  He is on low-dose furosemide.  Serum creatinine is in the 1.4 range followed by Dr. Marval Regal, his nephrologist.  He has no edema on the right and 1+ on the left.

## 2021-11-16 NOTE — Assessment & Plan Note (Signed)
History of hyperlipidemia not on statin therapy followed by his PCP 

## 2021-11-16 NOTE — Assessment & Plan Note (Signed)
History of essential hypertension a blood pressure measured today at 132/79.  He is currently not on antihypertensive medications.

## 2021-11-16 NOTE — Patient Instructions (Signed)
Medication Instructions:  Your physician recommends that you continue on your current medications as directed. Please refer to the Current Medication list given to you today.  *If you need a refill on your cardiac medications before your next appointment, please call your pharmacy*   Follow-Up: At [facility], you and your health needs are our priority.  As part of our continuing mission to provide you with exceptional heart care, we have created designated Provider Care Teams.  These Care Teams include your primary Cardiologist (physician) and Advanced Practice Providers (APPs -  Physician Assistants and Nurse Practitioners) who all work together to provide you with the care you need, when you need it.  We recommend signing up for the patient portal called "MyChart".  Sign up information is provided on this After Visit Summary.  MyChart is used to connect with patients for Virtual Visits (Telemedicine).  Patients are able to view lab/test results, encounter notes, upcoming appointments, etc.  Non-urgent messages can be sent to your provider as well.   To learn more about what you can do with MyChart, go to [email or web address].    Your next appointment:   6 month(s)  The format for your next appointment:   In Person  Provider:   Coletta Memos, FNP or Diona Browner, NP       Then, Quay Burow, MD will plan to see you again in 12 month(s).

## 2021-11-16 NOTE — Assessment & Plan Note (Signed)
Moderate mitral regurgitation on 2D echo performed 06/09/2021 which has remained stable.  He is asymptomatic from this.

## 2021-11-18 ENCOUNTER — Encounter (INDEPENDENT_AMBULATORY_CARE_PROVIDER_SITE_OTHER): Payer: Self-pay | Admitting: Ophthalmology

## 2021-11-18 ENCOUNTER — Encounter (INDEPENDENT_AMBULATORY_CARE_PROVIDER_SITE_OTHER): Payer: Medicare Other | Admitting: Ophthalmology

## 2021-11-18 ENCOUNTER — Ambulatory Visit (INDEPENDENT_AMBULATORY_CARE_PROVIDER_SITE_OTHER): Payer: Medicare Other | Admitting: Ophthalmology

## 2021-11-18 DIAGNOSIS — H43391 Other vitreous opacities, right eye: Secondary | ICD-10-CM | POA: Insufficient documentation

## 2021-11-18 DIAGNOSIS — H35341 Macular cyst, hole, or pseudohole, right eye: Secondary | ICD-10-CM | POA: Diagnosis not present

## 2021-11-18 DIAGNOSIS — H35371 Puckering of macula, right eye: Secondary | ICD-10-CM

## 2021-11-18 NOTE — Progress Notes (Signed)
11/18/2021     CHIEF COMPLAINT Patient presents for  Chief Complaint  Patient presents with   Post-op Follow-up      HISTORY OF PRESENT ILLNESS: Zachary Padilla is a 88 y.o. male who presents to the clinic today for:   HPI     Post-op Follow-up           Laterality: right eye   Discomfort: itching and floaters.  Negative for pain, foreign body sensation, tearing and discharge   Vision: is stable         Comments   WIP- POST OP New floaters OD SX 10/13/2021 [name] Pt reports, "Ever since my procedure, I would see these floaters in my right eye and sometimes it would look like mini donuts but they would come and go. My eyes would still feel itchy and scratchy. I would look up in the sky and these floaters would appear ion my vision. Today I don't see them" Patient has allergy to CIPRO.        Last edited by Silvestre Moment on 11/18/2021  1:15 PM.      Referring physician: Lajean Padilla, [address]. [address] [place],  [place] [address]  HISTORICAL INFORMATION:   Selected notes from the MEDICAL RECORD NUMBER       CURRENT MEDICATIONS: Current Outpatient Medications (Ophthalmic Drugs)  Medication Sig   ofloxacin (OCUFLOX) 0.3 % ophthalmic solution 1 drop 2 (two) times daily.   No current facility-administered medications for this visit. (Ophthalmic Drugs)   Current Outpatient Medications (Other)  Medication Sig   Azelaic Acid 15 % cream Apply 1 application topically 2 (two) times daily as needed. For Roscacea   cholecalciferol (VITAMIN D3) 25 MCG (1000 UNIT) tablet Take 1,000 Units by mouth daily.   furosemide (LASIX) 20 MG tablet Take 20 mg by mouth daily.   guaiFENesin (MUCINEX) 600 MG 12 hr tablet Take by mouth as needed.   levETIRAcetam (KEPPRA) 500 MG tablet TAKE ONE-HALF TABLET IN THE MORNING AND TAKE ONE TABLET IN THE EVENING   mupirocin ointment (BACTROBAN) 2 % as needed.   sodium chloride (OCEAN)  0.65 % nasal spray 2 (two) times daily.   UNABLE TO FIND as needed. Simple saline   No current facility-administered medications for this visit. (Other)      REVIEW OF SYSTEMS: ROS   Negative for: Constitutional, Gastrointestinal, Neurological, Skin, Genitourinary, Musculoskeletal, HENT, Endocrine, Cardiovascular, Eyes, Respiratory, Psychiatric, Allergic/Imm, Heme/Lymph Last edited by Silvestre Moment on 11/18/2021  1:12 PM.       ALLERGIES Allergies  Allergen Reactions   Ace Inhibitors Other (See Comments)    unknown   Aminoglycosides    Erythromycin Nausea And Vomiting   Levofloxacin Other (See Comments)    dehydration   Penicillins Nausea And Vomiting   Sulfa Antibiotics    Ciprofloxacin Rash    hives    PAST MEDICAL HISTORY Past Medical History:  Diagnosis Date   ACNE ROSACEA 09/09/2009   ALLERGIC RHINITIS 02/23/2009   Basal cell carcinoma 06/23/2010   R cheek- (CX35FU+ exc )   COLONIC POLYPS, HX OF 02/23/2009   Convulsions/seizures ([place]) 10/24/2014   DIVERTICULITIS, HX OF 02/23/2009   Ear infection    Family history of heart disease    GERD 02/23/2009   Hyperlipidemia    HYPERTENSION 02/23/2009   PONV (postoperative nausea and vomiting)    Posterior vitreous detachment of right eye 08/26/2019   Pre-syncope  Thoracic aortic aneurysm ([place])    URINARY INCONTINENCE 02/23/2009   Past Surgical History:  Procedure Laterality Date   APPENDECTOMY  1946   CATARACT EXTRACTION Bilateral    EYE SURGERY Right 07/25/2019   Left ear tube  2001   Removal of pre-cancer bump on neck  2005   Ruptured appendics  1946   Sinus surgery  2008    FAMILY HISTORY Family History  Problem Relation Age of Onset   Heart disease Mother    Stroke Father    Heart disease Father    Cancer Sister    Cancer Brother    Heart disease Other    Stroke Other    Seizures Neg Hx     SOCIAL HISTORY Social History   Tobacco Use   Smoking status: Former   Smokeless tobacco: Never   Tobacco  comments:    Lives with spouse-independant in all ADLs. [employer] and [employer] at [employer], Married in "52"  Vaping Use   Vaping Use: Never used  Substance Use Topics   Alcohol use: No   Drug use: No         OPHTHALMIC EXAM:  Base Eye Exam     Visual Acuity (ETDRS)       Right Left   Dist cc 20/50 -1 20/20   Dist ph cc NI     Correction: Glasses  Pt states he sees squiggly lines while reading off the eye chart.        Tonometry (Tonopen, 1:19 PM)       Right Left   Pressure 12 24         Pupils       Pupils APD   Right PERRL None   Left PERRL None         Visual Fields       Left Right    Full Full         Extraocular Movement       Right Left    Full Full         Neuro/Psych     Oriented x3: Yes   Mood/Affect: Normal         Dilation     Right eye: 2.5% Phenylephrine, 1.0% Mydriacyl @ 1:19 PM           Slit Lamp and Fundus Exam     External Exam       Right Left   External Normal Normal         Slit Lamp Exam       Right Left   Lids/Lashes Normal, trichiasis temporally, 3-4 small thin lashes abrading the conjunctival surface Normal   Conjunctiva/Sclera Fanara and quiet Pulcini and quiet   Cornea  , Debris in tear film completely resolved, epithelium and cornea are now clear Clear   Anterior Chamber Deep and quiet, no cells Deep and quiet   Iris Round and reactive Round and reactive   Lens Centered posterior chamber intraocular lens Centered posterior chamber intraocular lens   Anterior Vitreous Normal Normal         Fundus Exam       Right Left   Posterior Vitreous Vitrectomized,    Disc Normal    C/D Ratio 0.5    Macula Negative Watzke, hole closed retina attached    Vessels Normal    Periphery Normal             IMAGING AND PROCEDURES  Imaging and Procedures for  11/18/21  OCT, Retina - OU - Both Eyes       Right Eye Quality was good. Scan locations included subfoveal.  Central Foveal Thickness: 350. Progression has been stable. Findings include abnormal foveal contour.   Left Eye Quality was good. Scan locations included subfoveal. Central Foveal Thickness: 266. Progression has been stable. Findings include cystoid macular edema.   Notes  Macular thickening continues to resolve 1 month post ILM removal via vitrectomy gas injection, with only a now a small outer retinal gap in the photoreceptor layer.  However the central retina is now much thicker appropriately no longer attenuated from the tangential traction of ERM                     ASSESSMENT/PLAN:  Vitreous floater, right Reassured patient that occasional floater is typical after any kind of surgery in fact even without surgery can occur.  He should be concerned if there is ever a shower of floaters particular if it is associated with a profound dimming darkening or graying of the vision or curtain of darkness.    Macular hole of right eye Anatomically much improved as well as visually improving OD.  Right epiretinal membrane Improved post vitrectomy and release of ILM traction triggering tangential traction and macular foveal attenuation and vision change     ICD-10-CM   1. Macular hole of right eye  H35.341 OCT, Retina - OU - Both Eyes    2. Vitreous floater, right  H43.391     3. Right epiretinal membrane  H35.371       1.  2.  3.  Ophthalmic Meds Ordered this visit:  No orders of the defined types were placed in this encounter.      Return for As scheduled, dilate, OD, OCT.  There are no Patient Instructions on file for this visit.   Explained the diagnoses, plan, and follow up with the patient and they expressed understanding.  Patient expressed understanding of the importance of proper follow up care.   Clent Demark Fe Okubo M.D. Diseases & Surgery of the Retina and Vitreous [facility] 11/18/21     Abbreviations: M myopia (nearsighted);  A astigmatism; H hyperopia (farsighted); P presbyopia; Mrx spectacle prescription;  CTL contact lenses; OD right eye; OS left eye; OU both eyes  XT exotropia; ET esotropia; PEK punctate epithelial keratitis; PEE punctate epithelial erosions; DES dry eye syndrome; MGD meibomian gland dysfunction; ATs artificial tears; PFAT's preservative free artificial tears; Dukes nuclear sclerotic cataract; PSC posterior subcapsular cataract; ERM epi-retinal membrane; PVD posterior vitreous detachment; RD retinal detachment; DM diabetes mellitus; DR diabetic retinopathy; NPDR non-proliferative diabetic retinopathy; PDR proliferative diabetic retinopathy; CSME clinically significant macular edema; DME diabetic macular edema; dbh dot blot hemorrhages; CWS cotton wool spot; POAG primary open angle glaucoma; C/D cup-to-disc ratio; HVF humphrey visual field; GVF goldmann visual field; OCT optical coherence tomography; IOP intraocular pressure; BRVO Branch retinal vein occlusion; CRVO central retinal vein occlusion; CRAO central retinal artery occlusion; BRAO branch retinal artery occlusion; RT retinal tear; SB scleral buckle; PPV pars plana vitrectomy; VH Vitreous hemorrhage; PRP panretinal laser photocoagulation; IVK intravitreal kenalog; VMT vitreomacular traction; MH Macular hole;  NVD neovascularization of the disc; NVE neovascularization elsewhere; AREDS age related eye disease study; ARMD age related macular degeneration; POAG primary open angle glaucoma; EBMD epithelial/anterior basement membrane dystrophy; ACIOL anterior chamber intraocular lens; IOL intraocular lens; PCIOL posterior chamber intraocular lens; Phaco/IOL phacoemulsification with intraocular lens placement; PRK photorefractive keratectomy; LASIK  laser assisted in situ keratomileusis; HTN hypertension; DM diabetes mellitus; COPD chronic obstructive pulmonary disease

## 2021-11-18 NOTE — Assessment & Plan Note (Signed)
Anatomically much improved as well as visually improving OD.

## 2021-11-18 NOTE — Assessment & Plan Note (Signed)
Reassured patient that occasional floater is typical after any kind of surgery in fact even without surgery can occur.  He should be concerned if there is ever a shower of floaters particular if it is associated with a profound dimming darkening or graying of the vision or curtain of darkness.

## 2021-11-18 NOTE — Assessment & Plan Note (Signed)
Improved post vitrectomy and release of ILM traction triggering tangential traction and macular foveal attenuation and vision change

## 2021-12-07 ENCOUNTER — Telehealth: Payer: Self-pay | Admitting: Cardiovascular Disease

## 2021-12-07 NOTE — Telephone Encounter (Signed)
Returned the call to the patient. He was calling to talk about his skin cancer removal procedure on Thursday. He did not have any specific questions concerning this. He was calling to talk about it. Nothing further was needed.

## 2021-12-07 NOTE — Telephone Encounter (Signed)
Pt would like a callback regarding an upcoming procedure that he is scheduled to have. Pt has questions about medications. Please advise

## 2021-12-22 ENCOUNTER — Ambulatory Visit (INDEPENDENT_AMBULATORY_CARE_PROVIDER_SITE_OTHER): Payer: Medicare Other | Admitting: Ophthalmology

## 2021-12-22 ENCOUNTER — Encounter (INDEPENDENT_AMBULATORY_CARE_PROVIDER_SITE_OTHER): Payer: Self-pay | Admitting: Ophthalmology

## 2021-12-22 DIAGNOSIS — H35341 Macular cyst, hole, or pseudohole, right eye: Secondary | ICD-10-CM

## 2021-12-22 NOTE — Assessment & Plan Note (Signed)
OD now 2 months post vitrectomy membrane peel for tangential traction triggering schisis of the inner retina and outer retinal hole.  The outer retinal hole is now smaller and the inner macular schisis has closed.   accompanied by acuity now improvement to 20/25 OD.

## 2021-12-22 NOTE — Progress Notes (Signed)
12/22/2021     CHIEF COMPLAINT Patient presents for  Chief Complaint  Patient presents with   Post-op Follow-up      HISTORY OF PRESENT ILLNESS: Zachary Padilla is a 88 y.o. male who presents to the clinic today for:   HPI     Post-op Follow-up           Laterality: right eye   Discomfort: tearing and floaters.  Negative for pain, itching, foreign body sensation and discharge   Vision: is worse         Comments   4 weeks for DILATE OD, OCT, POST-OP SX  "I had surgery on my nose for skin cancer. I had two black eyes. The surgeon stayed away from the side where Dr. Zadie Rhine operated on my right eye to avoid swelling in my right eye. I was off of my CPAP machine because my nose was still swollen but I'm going to get back on it this weekend. I also want to get clearance from Dr. Zadie Rhine. He wanted to operate in September. The nose doctor wanted to operate near my right eye but I want to ask Dr. Zadie Rhine first." Pt stated vision has slightly decreased since last visit. Pt noticed "ridgy lines" when looking at a straight line.        Last edited by Silvestre Moment on 12/22/2021  2:59 PM.      Referring physician: Lajean Manes, [place]. [address] [place],  [place] [address]  HISTORICAL INFORMATION:   Selected notes from the MEDICAL RECORD NUMBER       CURRENT MEDICATIONS: Current Outpatient Medications (Ophthalmic Drugs)  Medication Sig   ofloxacin (OCUFLOX) 0.3 % ophthalmic solution 1 drop 2 (two) times daily.   No current facility-administered medications for this visit. (Ophthalmic Drugs)   Current Outpatient Medications (Other)  Medication Sig   Azelaic Acid 15 % cream Apply 1 application topically 2 (two) times daily as needed. For Roscacea   cholecalciferol (VITAMIN D3) 25 MCG (1000 UNIT) tablet Take 1,000 Units by mouth daily.   furosemide (LASIX) 20 MG tablet Take 20 mg by mouth daily.   guaiFENesin (MUCINEX) 600 MG 12 hr tablet Take by mouth as needed.    levETIRAcetam (KEPPRA) 500 MG tablet TAKE ONE-HALF TABLET IN THE MORNING AND TAKE ONE TABLET IN THE EVENING   mupirocin ointment (BACTROBAN) 2 % as needed.   sodium chloride (OCEAN) 0.65 % nasal spray 2 (two) times daily.   UNABLE TO FIND as needed. Simple saline   No current facility-administered medications for this visit. (Other)      REVIEW OF SYSTEMS: ROS   Negative for: Constitutional, Gastrointestinal, Neurological, Skin, Genitourinary, Musculoskeletal, HENT, Endocrine, Cardiovascular, Eyes, Respiratory, Psychiatric, Allergic/Imm, Heme/Lymph Last edited by Silvestre Moment on 12/22/2021  2:58 PM.       ALLERGIES Allergies  Allergen Reactions   Ace Inhibitors Other (See Comments)    unknown   Aminoglycosides    Erythromycin Nausea And Vomiting   Levofloxacin Other (See Comments)    dehydration   Penicillins Nausea And Vomiting   Sulfa Antibiotics    Ciprofloxacin Rash    hives    PAST MEDICAL HISTORY Past Medical History:  Diagnosis Date   ACNE ROSACEA 09/09/2009   ALLERGIC RHINITIS 02/23/2009   Basal cell carcinoma 06/23/2010   R cheek- (CX35FU+ exc )   COLONIC POLYPS, HX OF 02/23/2009   Convulsions/seizures ([place]) 10/24/2014   DIVERTICULITIS, HX OF 02/23/2009   Ear infection  Family history of heart disease    GERD 02/23/2009   Hyperlipidemia    HYPERTENSION 02/23/2009   PONV (postoperative nausea and vomiting)    Posterior vitreous detachment of right eye 08/26/2019   Pre-syncope    Thoracic aortic aneurysm ([place])    URINARY INCONTINENCE 02/23/2009   Past Surgical History:  Procedure Laterality Date   APPENDECTOMY  1946   CATARACT EXTRACTION Bilateral    EYE SURGERY Right 07/25/2019   Left ear tube  2001   Removal of pre-cancer bump on neck  2005   Ruptured appendics  1946   Sinus surgery  2008    FAMILY HISTORY Family History  Problem Relation Age of Onset   Heart disease Mother    Stroke Father    Heart disease Father    Cancer Sister    Cancer  Brother    Heart disease Other    Stroke Other    Seizures Neg Hx     SOCIAL HISTORY Social History   Tobacco Use   Smoking status: Former   Smokeless tobacco: Never   Tobacco comments:    Lives with spouse-independant in all ADLs. [employer] and [employer] at [employer], Married in "52"  Vaping Use   Vaping Use: Never used  Substance Use Topics   Alcohol use: No   Drug use: No         OPHTHALMIC EXAM:  Base Eye Exam     Visual Acuity (ETDRS)       Right Left   Dist [place] 20/25 -2 20/40   Dist ph [place]  20/20         Tonometry (Tonopen, 3:04 PM)       Right Left   Pressure 16 15         Pupils       Pupils APD   Right PERRL None   Left PERRL None         Visual Fields       Left Right    Full Full         Extraocular Movement       Right Left    Full, Ortho Full, Ortho         Neuro/Psych     Oriented x3: Yes   Mood/Affect: Normal         Dilation     Right eye: 2.5% Phenylephrine, 1.0% Mydriacyl @ 3:04 PM           Slit Lamp and Fundus Exam     External Exam       Right Left   External Normal Normal         Slit Lamp Exam       Right Left   Lids/Lashes Normal, trichiasis temporally, 3-4 small thin lashes abrading the conjunctival surface Normal   Conjunctiva/Sclera Ecklund and quiet Corliss and quiet   Cornea  , Debris in tear film completely resolved, epithelium and cornea are now clear Clear   Anterior Chamber Deep and quiet, no cells Deep and quiet   Iris Round and reactive Round and reactive   Lens Centered posterior chamber intraocular lens Centered posterior chamber intraocular lens   Anterior Vitreous Normal Normal         Fundus Exam       Right Left   Posterior Vitreous Vitrectomized,    Disc Normal    C/D Ratio 0.5    Macula Negative Watzke, hole closed retina attached    Vessels  Normal    Periphery Normal             IMAGING AND PROCEDURES  Imaging and Procedures for  12/22/21  OCT, Retina - OU - Both Eyes       Right Eye Quality was good. Scan locations included subfoveal. Central Foveal Thickness: 326. Progression has been stable. Findings include abnormal foveal contour.   Left Eye Quality was good. Scan locations included subfoveal. Central Foveal Thickness: 261. Progression has been stable. Findings include cystoid macular edema.   Notes  Macular thickening continues to resolve 2 month post ILM removal via vitrectomy gas injection, with only a now a small outer retinal gap in the photoreceptor layer.  However the central retina is now much thicker appropriately no longer attenuated from the tangential traction of ERM                     ASSESSMENT/PLAN:  Macular hole of right eye OD now 2 months post vitrectomy membrane peel for tangential traction triggering schisis of the inner retina and outer retinal hole.  The outer retinal hole is now smaller and the inner macular schisis has closed.   accompanied by acuity now improvement to 20/25 OD.       ICD-10-CM   1. Macular hole of right eye  H35.341 OCT, Retina - OU - Both Eyes      1.  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  Outer retinal hole and inner foveal macular schisis both improved with improving acuity now 2025.  I explained to the patient that the outer gap in the photoreceptor layer while likely to improve may take up to 1 year.  Healing is slowed in people who have known sleep apnea and not able to use her CPAP.  That is coincident with his not able to use CPAP recently for recent skin surgery on the nose healing purposes  2.  3.  Ophthalmic Meds Ordered this visit:  No orders of the defined types were placed in this encounter.      Return in about 8 months (around 08/23/2022) for DILATE OU, COLOR FP, OCT.  There are no Patient Instructions on file for this visit.   Explained the diagnoses, plan, and  follow up with the patient and they expressed understanding.  Patient expressed understanding of the importance of proper follow up care.   Clent Demark Ereka Brau M.D. Diseases & Surgery of the Retina and Vitreous [facility]ina [facility]abetic [place] 12/22/21     Abbreviations: M myopia (nearsighted); A astigmatism; H hyperopia (farsighted); P presbyopia; Mrx spectacle prescription;  CTL contact lenses; OD right eye; OS left eye; OU both eyes  XT exotropia; ET esotropia; PEK punctate epithelial keratitis; PEE punctate epithelial erosions; DES dry eye syndrome; MGD meibomian gland dysfunction; ATs artificial tears; PFAT's preservative free artificial tears; [place] nuclear sclerotic cataract; PSC posterior subcapsular cataract; ERM epi-retinal membrane; PVD posterior vitreous detachment; RD retinal detachment; DM diabetes mellitus; DR diabetic retinopathy; NPDR non-proliferative diabetic retinopathy; PDR proliferative diabetic retinopathy; CSME clinically significant macular edema; DME diabetic macular edema; dbh dot blot hemorrhages; CWS cotton wool spot; POAG primary open angle glaucoma; C/D cup-to-disc ratio; HVF humphrey visual field; GVF goldmann visual field; OCT optical coherence tomography; IOP intraocular pressure; BRVO Branch retinal vein occlusion; CRVO central retinal vein occlusion; CRAO central retinal artery occlusion; BRAO branch retinal artery occlusion; RT retinal tear; SB scleral buckle; PPV pars plana vitrectomy; VH  Vitreous hemorrhage; PRP panretinal laser photocoagulation; IVK intravitreal kenalog; VMT vitreomacular traction; MH Macular hole;  NVD neovascularization of the disc; NVE neovascularization elsewhere; AREDS age related eye disease study; ARMD age related macular degeneration; POAG primary open angle glaucoma; EBMD epithelial/anterior basement membrane dystrophy; ACIOL anterior chamber intraocular lens; IOL intraocular lens; PCIOL posterior chamber intraocular lens; Phaco/IOL  phacoemulsification with intraocular lens placement; [place] photorefractive keratectomy; LASIK laser assisted in situ keratomileusis; HTN hypertension; DM diabetes mellitus; COPD chronic obstructive pulmonary disease

## 2021-12-30 ENCOUNTER — Encounter (INDEPENDENT_AMBULATORY_CARE_PROVIDER_SITE_OTHER): Payer: Medicare Other | Admitting: Ophthalmology

## 2022-02-17 ENCOUNTER — Telehealth: Payer: Self-pay | Admitting: Cardiovascular Disease

## 2022-02-17 NOTE — Telephone Encounter (Signed)
Spoke with patient of Dr. Gwenlyn Found   He has had some skin surgery/cancer. It was recommended he had some treatment w/radiation in a few weeks. Would like to know if OK to proceed, given cardiac condition  He would like to know if covid booster shot is recommended  Advised will send to Dr. Gwenlyn Found - patient aware he is out of office until 10/16

## 2022-02-17 NOTE — Telephone Encounter (Signed)
Pt is requesting call back to get advice on booster shot and skin cancer treatment. Wanting to know more details on both of these and current med interactions. Please advise.

## 2022-02-18 NOTE — Telephone Encounter (Signed)
Attempted to call patient - no VM available, no answer

## 2022-02-18 NOTE — Telephone Encounter (Signed)
Lorretta Harp, MD  Fidel Levy, RN Caller: Unspecified (Yesterday,  4:25 PM) Treating his skin cancer would not affect his heart (Radiation). I don't comment on covid boosters but usually defer to PCP

## 2022-02-23 NOTE — Telephone Encounter (Signed)
Tried to call pt no answer, no VM available

## 2022-02-23 NOTE — Progress Notes (Signed)
Head and Neck Cancer Location of Tumor / Histology: left inferior [place] lip   Biopsies revealed:    Nutrition Status Yes No Comments  Weight changes? '[]'$  '[x]'$    Swallowing concerns? '[]'$  '[x]'$    PEG? '[]'$  '[x]'$     Referrals Yes No Comments  Social Work? '[]'$  '[x]'$    Dentistry? '[]'$  '[x]'$    Swallowing therapy? '[]'$  '[x]'$    Nutrition? '[]'$  '[x]'$    Med/Onc? '[]'$  '[x]'$     Safety Issues Yes No Comments  Prior radiation? '[]'$  '[x]'$    Pacemaker/ICD? '[]'$  '[x]'$    Possible current pregnancy? '[]'$  '[x]'$    Is the patient on methotrexate? '[]'$  '[x]'$     Tobacco/Marijuana/Snuff/ETOH use: no  Past/Anticipated interventions by otolaryngology, if any: no  Past/Anticipated interventions by medical oncology, if any: no     Current Complaints / other details:   Vitals:   03/01/22 1244  BP: (!) 143/74  Pulse: 70  Resp: 18  Temp: 97.7 F (36.5 C)  TempSrc: Oral  SpO2: 99%  Weight: 72.6 kg

## 2022-02-25 NOTE — Telephone Encounter (Signed)
Voicemail box not set up yet. Unable to leave a message.

## 2022-02-28 NOTE — Progress Notes (Signed)
Radiation Oncology         [phone] ________________________________  Initial Outpatient Consultation  Name: Zachary Padilla MRN: [id number]  Date: 03/01/2022  DOB: 01-Jun-1928  WU:[id number], Christiane Ha, MD  Venetia Night, MD   REFERRING PHYSICIAN: Venetia Night, MD  DIAGNOSIS:    ICD-10-CM   1. 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        Cancer Staging  Cancer of lower lip, vermilion border Staging form: Cutaneous Carcinoma of the Head and Neck, AJCC 8th Edition - Pathologic stage from 03/01/2022: Stage III (pT3, pN0, cM0) - Signed by Eppie Gibson, MD on 03/01/2022 Stage prefix: Initial diagnosis Extraosseous extension: Absent     CHIEF COMPLAINT: Here to discuss management of skin cancer of the lip  HISTORY OF PRESENT ILLNESS::Zachary Padilla is a 88 y.o. male who presented to Dr. Danielle Dess at the [facility] on 02/03/22 for evaluation and Mohs procedure of a growing site of SCC on his left inferior vermillion lip. The site was initially biopsied on 01/13/22 and had increased in size since. The lesion was also noted to be tender on evaluation.  Pathology from Mohs to the left inferior lip lesion on 02/03/22 revealed invasive squamous cell carcinoma with PNI (largest involved nerve measuring 0.1 mm in diameter).   Accordingly, Dr. Danielle Dess referred the patient to me for consideration of radiation to the site for best chance of cure.  Nutrition Status Yes No Comments  Weight changes? '[]'$  '[x]'$    Swallowing concerns? '[]'$  '[x]'$    PEG? '[]'$  '[x]'$     Referrals Yes No Comments  Social Work? '[]'$  '[x]'$    Dentistry? '[]'$  '[x]'$    Swallowing therapy? '[]'$  '[x]'$    Nutrition? '[]'$  '[x]'$    Med/Onc? '[]'$  '[x]'$     Safety Issues Yes No Comments  Prior radiation? '[]'$  '[x]'$    Pacemaker/ICD? '[]'$  '[x]'$    Possible current pregnancy? '[]'$  '[x]'$    Is the patient on methotrexate? '[]'$  '[x]'$     Tobacco/Marijuana/Snuff/ETOH use: no     Photo from  Dr. Danielle Dess:   PREVIOUS RADIATION THERAPY: No  PAST MEDICAL HISTORY:  has a past medical history of ACNE ROSACEA (09/09/2009), ALLERGIC RHINITIS (02/23/2009), Basal cell carcinoma (06/23/2010), COLONIC POLYPS, HX OF (02/23/2009), Convulsions/seizures ([place]) (10/24/2014), DIVERTICULITIS, HX OF (02/23/2009), Ear infection, Family history of heart disease, GERD (02/23/2009), Hyperlipidemia, HYPERTENSION (02/23/2009), PONV (postoperative nausea and vomiting), Posterior vitreous detachment of right eye (08/26/2019), Pre-syncope, Thoracic aortic aneurysm ([place]), and URINARY INCONTINENCE (02/23/2009).    PAST SURGICAL HISTORY: Past Surgical History:  Procedure Laterality Date   APPENDECTOMY  1946   CATARACT EXTRACTION Bilateral    EYE SURGERY Right 07/25/2019   Left ear tube  2001   Removal of pre-cancer bump on neck  2005   Ruptured appendics  1946   Sinus surgery  2008    FAMILY HISTORY: family history includes Cancer in his brother and sister; Heart disease in his father, mother, and another family member; Stroke in his father and another family member.  SOCIAL HISTORY:  reports that he has quit smoking. He has never used smokeless tobacco. He reports that he does not drink alcohol and does not use drugs.  ALLERGIES: Ace inhibitors, Aminoglycosides, Erythromycin, Levofloxacin, Penicillins, Sulfa antibiotics, and Ciprofloxacin  MEDICATIONS:  Current Outpatient Medications  Medication Sig Dispense Refill   Azelaic Acid 15 % cream Apply 1 application topically 2 (two) times daily as needed. For Roscacea  cholecalciferol (VITAMIN D3) 25 MCG (1000 UNIT) tablet Take 1,000 Units by mouth daily.     furosemide (LASIX) 20 MG tablet Take 20 mg by mouth daily.     levETIRAcetam (KEPPRA) 500 MG tablet TAKE ONE-HALF TABLET IN THE MORNING AND TAKE ONE TABLET IN THE EVENING 135 tablet 3   sodium chloride (OCEAN) 0.65 % nasal spray 2 (two) times daily.     UNABLE TO FIND as needed. Simple saline      guaiFENesin (MUCINEX) 600 MG 12 hr tablet Take by mouth as needed. (Patient not taking: Reported on 03/01/2022)     mupirocin ointment (BACTROBAN) 2 % as needed. (Patient not taking: Reported on 03/01/2022)     ofloxacin (OCUFLOX) 0.3 % ophthalmic solution 1 drop 2 (two) times daily. (Patient not taking: Reported on 03/01/2022)     No current facility-administered medications for this encounter.    REVIEW OF SYSTEMS:  Notable for that above.   PHYSICAL EXAM:  weight is 160 lb 2 oz (72.6 kg). His oral temperature is 97.7 F (36.5 C). His blood pressure is 143/74 (abnormal) and his pulse is 70. His respiration is 18 and oxygen saturation is 99%.   General: Alert and oriented, in no acute distress  HEENT: Head is normocephalic. Extraocular movements are intact.  Lower left lip surgical site healing nicely. Incisional site intact. Neck: Neck is supple, no palpable cervical or supraclavicular lymphadenopathy. Heart: bradycardic to ausculation with occasional skipped beats Chest: Clear to auscultation bilaterally, with no rhonchi, wheezes, or rales. Lymphatics: see Neck Exam Musculoskeletal: well noursished Neurologic: No obvious focalities. Speech is fluent. Coordination is intact. Psychiatric: Judgment and insight are intact. Affect is appropriate.  ECOG = 1  0 - Asymptomatic (Fully active, able to carry on all predisease activities without restriction)  1 - Symptomatic but completely ambulatory (Restricted in physically strenuous activity but ambulatory and able to carry out work of a light or sedentary nature. For example, light housework, office work)  2 - Symptomatic, <50% in bed during the day (Ambulatory and capable of all self care but unable to carry out any work activities. Up and about more than 50% of waking hours)  3 - Symptomatic, >50% in bed, but not bedbound (Capable of only limited self-care, confined to bed or chair 50% or more of waking hours)  4 - Bedbound (Completely  disabled. Cannot carry on any self-care. Totally confined to bed or chair)  5 - Death   Eustace Pen MM, Creech RH, Tormey DC, et al. [phone]). "Toxicity and response criteria of the [facility]". [place] Oncol. 5 (6): 649-55   LABORATORY DATA:  Lab Results  Component Value Date   WBC 5.5 03/22/2016   HGB 14.3 03/22/2016   HCT 41.9 03/22/2016   MCV 96.5 03/22/2016   PLT 160 03/22/2016   CMP     Component Value Date/Time   NA 134 (L) 03/22/2016 1128   K 4.7 03/22/2016 1128   CL 101 03/22/2016 1128   CO2 27 03/22/2016 1128   GLUCOSE 80 03/22/2016 1128   BUN 17 03/22/2016 1128   CREATININE 1.27 (H) 03/22/2016 1128   CALCIUM 8.7 03/22/2016 1128   PROT 6.6 03/22/2016 1128   ALBUMIN 3.9 03/22/2016 1128   AST 22 03/22/2016 1128   ALT 15 03/22/2016 1128   ALKPHOS 73 03/22/2016 1128   BILITOT 1.0 03/22/2016 1128   GFRNONAA 51 (L) 03/22/2016 1128   GFRAA 59 (L) 03/22/2016 1128  RADIOGRAPHY: No results found.    IMPRESSION/PLAN: Skin cancer, left lower lip, significant PNI   Today, I talked to the patient and family about the findings and work-up thus far.  We discussed the patient's diagnosis of left lower lip squamous cell carcinoma and general treatment for this, highlighting the role of radiotherapy in the management.  We discussed the available radiation techniques, and focused on the details of logistics and delivery.      I recommend 4 weeks of adjuvant electron radiotherapy for local control.   We discussed the risks, benefits, and side effects of radiotherapy. Side effects may include but not necessarily be limited to: skin irritation, fatigue, skin/lip peeling, local hair loss, rare permanent injury to soft tissue or bone in the treatment region, mucosal irritation, taste changes.  No guarantees of treatment were given. A consent form was signed and placed in the patient's medical record. The patient was encouraged to ask questions that I  answered to the best of my ability.     Will arrange CT simulation in the near future and start treatment about a week thereafter.   It was a pleasure to meet this wonderful man today.   On date of service, in total, I spent 50 minutes on this encounter. Patient was seen in person.   __________________________________________   Eppie Gibson, MD  This document serves as a record of services personally performed by Eppie Gibson, MD. It was created on her behalf by Roney Mans, a trained medical scribe. The creation of this record is based on the scribe's personal observations and the provider's statements to them. This document has been checked and approved by the attending provider.

## 2022-03-01 ENCOUNTER — Ambulatory Visit
Admission: RE | Admit: 2022-03-01 | Discharge: 2022-03-01 | Disposition: A | Discharge status: Home / Self Care | Payer: Medicare Other | Source: Ambulatory Visit | Attending: Radiation Oncology | Admitting: Radiation Oncology

## 2022-03-01 ENCOUNTER — Encounter: Payer: Self-pay | Admitting: Radiation Oncology

## 2022-03-01 VITALS — BP 143/74 | HR 70 | Temp 97.7°F | Resp 18 | Wt 160.1 lb

## 2022-03-01 DIAGNOSIS — Z79899 Other long term (current) drug therapy: Secondary | ICD-10-CM | POA: Diagnosis not present

## 2022-03-01 DIAGNOSIS — Z87891 Personal history of nicotine dependence: Secondary | ICD-10-CM | POA: Insufficient documentation

## 2022-03-01 DIAGNOSIS — E785 Hyperlipidemia, unspecified: Secondary | ICD-10-CM | POA: Insufficient documentation

## 2022-03-01 DIAGNOSIS — C001 Malignant neoplasm of external lower lip: Secondary | ICD-10-CM | POA: Diagnosis present

## 2022-03-01 DIAGNOSIS — I71019 Dissection of thoracic aorta, unspecified: Secondary | ICD-10-CM | POA: Diagnosis not present

## 2022-03-01 NOTE — Progress Notes (Signed)
Oncology Nurse Navigator Documentation   Met with patient during initial consult with Dr. Isidore Moos.  I introduced myself as his Navigator, explained my role as a member of the Care Team. Assisted with post-consult appt scheduling. I walked him to the radiation waiting area where his daughter and wife waited during his consult with Dr. Isidore Moos. I went over what he spoke to Dr. Isidore Moos about and her recommendations and provided them with my direct contact information. They verbalized understanding of information provided. I encouraged them to call with questions/concerns moving forward.  Zachary Asa, RN, BSN, OCN Head & Neck Oncology Nurse [place] [facility] [phone]

## 2022-03-03 NOTE — Telephone Encounter (Signed)
Spoke with pt regarding questions about treating skin cancer and Covid booster.   Lorretta Harp, MD  Fidel Levy, RN     Treating his skin cancer would not affect his heart (Radiation). I don't comment on covid boosters but usually defer to PCP    Recommendations explained to pt. While on the phone pt asks to make his next appointment needed. Appointment made for pt. Per Dr. Gwenlyn Found, pt should have 6 month follow up with Diona Browner, NP in January. Appointment made for pt. Pt verbalizes understanding.

## 2022-03-07 ENCOUNTER — Ambulatory Visit
Admission: RE | Admit: 2022-03-07 | Discharge: 2022-03-07 | Disposition: A | Discharge status: Home / Self Care | Payer: Medicare Other | Source: Ambulatory Visit | Attending: Radiation Oncology | Admitting: Radiation Oncology

## 2022-03-07 DIAGNOSIS — C001 Malignant neoplasm of external lower lip: Secondary | ICD-10-CM | POA: Insufficient documentation

## 2022-03-08 ENCOUNTER — Ambulatory Visit (INDEPENDENT_AMBULATORY_CARE_PROVIDER_SITE_OTHER): Payer: Medicare Other | Admitting: Podiatry

## 2022-03-08 ENCOUNTER — Encounter: Payer: Self-pay | Admitting: Podiatry

## 2022-03-08 DIAGNOSIS — M79674 Pain in right toe(s): Secondary | ICD-10-CM | POA: Diagnosis not present

## 2022-03-08 DIAGNOSIS — M79675 Pain in left toe(s): Secondary | ICD-10-CM

## 2022-03-08 DIAGNOSIS — B351 Tinea unguium: Secondary | ICD-10-CM | POA: Diagnosis not present

## 2022-03-08 NOTE — Progress Notes (Unsigned)
This patient presents  to the office for evaluation and treatment of long thick painful nails .  This patient is unable to trim his own nails since the patient cannot reach his feet.  Patient says the nails are painful walking and wearing his shoes.  He returns for preventive foot care services.  General Appearance  Alert, conversant and in no acute stress.  Vascular  Dorsalis pedis and posterior tibial  pulses are weakly  palpable  bilaterally.  Capillary return is within normal limits  bilaterally. Temperature is within normal limits  bilaterally.  Neurologic  Senn-Weinstein monofilament wire test within normal limits  bilaterally. Muscle power within normal limits bilaterally.  Nails Thick disfigured discolored nails with subungual debris  from hallux to fifth toes bilaterally. No evidence of bacterial infection or drainage bilaterally.  Orthopedic  No limitations of motion  feet .  No crepitus or effusions noted.  No bony pathology or digital deformities noted.  Skin  normotropic skin with no porokeratosis noted bilaterally.  No signs of infections or ulcers noted.     Onychomycosis  Pain in toes right foot  Pain in toes left foot  Debridement  of nails  1-5  B/L with a nail nipper.  Nails were then filed using a dremel tool with no incidents.    RTC  3 months   Gardiner Barefoot DPM

## 2022-03-09 DIAGNOSIS — C001 Malignant neoplasm of external lower lip: Secondary | ICD-10-CM | POA: Diagnosis not present

## 2022-03-14 ENCOUNTER — Ambulatory Visit
Admission: RE | Admit: 2022-03-14 | Discharge: 2022-03-14 | Disposition: A | Discharge status: Home / Self Care | Payer: Medicare Other | Source: Ambulatory Visit | Attending: Radiation Oncology | Admitting: Radiation Oncology

## 2022-03-14 ENCOUNTER — Other Ambulatory Visit: Payer: Self-pay

## 2022-03-14 DIAGNOSIS — C001 Malignant neoplasm of external lower lip: Secondary | ICD-10-CM

## 2022-03-14 LAB — RAD ONC ARIA SESSION SUMMARY
Course Elapsed Days: 0
Plan Fractions Treated to Date: 1
Plan Prescribed Dose Per Fraction: 2.5 Gy
Plan Total Fractions Prescribed: 20
Plan Total Prescribed Dose: 50 Gy
Reference Point Dosage Given to Date: 2.5 Gy
Reference Point Session Dosage Given: 2.5 Gy
Session Number: 1

## 2022-03-14 MED ORDER — SONAFINE EX EMUL
1.0000 | Freq: Two times a day (BID) | CUTANEOUS | Status: DC
  Administered 2022-03-14: 1 via TOPICAL

## 2022-03-14 MED ORDER — SONAFINE EX EMUL: 1.0000 | Freq: Two times a day (BID) | CUTANEOUS | Status: DC

## 2022-03-14 NOTE — Progress Notes (Signed)
Pt here for patient teaching. Pt given Radiation and You booklet, Managing Acute Radiation Side Effects for Head and Neck Cancer handout, skin care instructions, and Sonafine.  Reviewed areas of pertinence such as diarrhea, fatigue, hair loss, mouth changes, nausea and vomiting, skin changes, throat changes, earaches, and taste changes. Pt able to give teach back of to pat skin, use unscented/gentle soap, have Imodium on hand, and drink plenty of water, apply Sonafine bid, avoid applying anything to skin within 4 hours of treatment, and to use an electric razor if they must shave. Pt verbalizes understanding of information given and will contact nursing with any questions or concerns.     [email or web address]

## 2022-03-15 ENCOUNTER — Other Ambulatory Visit: Payer: Self-pay

## 2022-03-15 ENCOUNTER — Ambulatory Visit
Admission: RE | Admit: 2022-03-15 | Discharge: 2022-03-15 | Disposition: A | Discharge status: Home / Self Care | Payer: Medicare Other | Source: Ambulatory Visit | Attending: Radiation Oncology | Admitting: Radiation Oncology

## 2022-03-15 DIAGNOSIS — C001 Malignant neoplasm of external lower lip: Secondary | ICD-10-CM | POA: Diagnosis not present

## 2022-03-15 LAB — RAD ONC ARIA SESSION SUMMARY
Course Elapsed Days: 1
Plan Fractions Treated to Date: 2
Plan Prescribed Dose Per Fraction: 2.5 Gy
Plan Total Fractions Prescribed: 20
Plan Total Prescribed Dose: 50 Gy
Reference Point Dosage Given to Date: 5 Gy
Reference Point Session Dosage Given: 2.5 Gy
Session Number: 2

## 2022-03-16 ENCOUNTER — Ambulatory Visit
Admission: RE | Admit: 2022-03-16 | Discharge: 2022-03-16 | Disposition: A | Discharge status: Home / Self Care | Payer: Medicare Other | Source: Ambulatory Visit | Attending: Radiation Oncology | Admitting: Radiation Oncology

## 2022-03-16 ENCOUNTER — Other Ambulatory Visit: Payer: Self-pay

## 2022-03-16 DIAGNOSIS — C001 Malignant neoplasm of external lower lip: Secondary | ICD-10-CM | POA: Diagnosis not present

## 2022-03-16 LAB — RAD ONC ARIA SESSION SUMMARY
Course Elapsed Days: 2
Plan Fractions Treated to Date: 3
Plan Prescribed Dose Per Fraction: 2.5 Gy
Plan Total Fractions Prescribed: 20
Plan Total Prescribed Dose: 50 Gy
Reference Point Dosage Given to Date: 7.5 Gy
Reference Point Session Dosage Given: 2.5 Gy
Session Number: 3

## 2022-03-17 ENCOUNTER — Ambulatory Visit
Admission: RE | Admit: 2022-03-17 | Discharge: 2022-03-17 | Disposition: A | Discharge status: Home / Self Care | Payer: Medicare Other | Source: Ambulatory Visit | Attending: Radiation Oncology | Admitting: Radiation Oncology

## 2022-03-17 ENCOUNTER — Other Ambulatory Visit: Payer: Self-pay

## 2022-03-17 DIAGNOSIS — C001 Malignant neoplasm of external lower lip: Secondary | ICD-10-CM | POA: Diagnosis not present

## 2022-03-17 LAB — RAD ONC ARIA SESSION SUMMARY
Course Elapsed Days: 3
Plan Fractions Treated to Date: 4
Plan Prescribed Dose Per Fraction: 2.5 Gy
Plan Total Fractions Prescribed: 20
Plan Total Prescribed Dose: 50 Gy
Reference Point Dosage Given to Date: 10 Gy
Reference Point Session Dosage Given: 2.5 Gy
Session Number: 4

## 2022-03-18 ENCOUNTER — Ambulatory Visit
Admission: RE | Admit: 2022-03-18 | Discharge: 2022-03-18 | Disposition: A | Discharge status: Home / Self Care | Payer: Medicare Other | Source: Ambulatory Visit | Attending: Radiation Oncology | Admitting: Radiation Oncology

## 2022-03-18 ENCOUNTER — Other Ambulatory Visit: Payer: Self-pay

## 2022-03-18 DIAGNOSIS — C001 Malignant neoplasm of external lower lip: Secondary | ICD-10-CM | POA: Diagnosis not present

## 2022-03-18 LAB — RAD ONC ARIA SESSION SUMMARY
Course Elapsed Days: 4
Plan Fractions Treated to Date: 5
Plan Prescribed Dose Per Fraction: 2.5 Gy
Plan Total Fractions Prescribed: 20
Plan Total Prescribed Dose: 50 Gy
Reference Point Dosage Given to Date: 12.5 Gy
Reference Point Session Dosage Given: 2.5 Gy
Session Number: 5

## 2022-03-21 ENCOUNTER — Ambulatory Visit
Admission: RE | Admit: 2022-03-21 | Discharge: 2022-03-21 | Disposition: A | Discharge status: Home / Self Care | Payer: Medicare Other | Source: Ambulatory Visit | Attending: Radiation Oncology | Admitting: Radiation Oncology

## 2022-03-21 ENCOUNTER — Ambulatory Visit: Payer: Medicare Other

## 2022-03-21 ENCOUNTER — Other Ambulatory Visit: Payer: Self-pay

## 2022-03-21 DIAGNOSIS — C001 Malignant neoplasm of external lower lip: Secondary | ICD-10-CM | POA: Diagnosis not present

## 2022-03-21 LAB — RAD ONC ARIA SESSION SUMMARY
Course Elapsed Days: 7
Plan Fractions Treated to Date: 6
Plan Prescribed Dose Per Fraction: 2.5 Gy
Plan Total Fractions Prescribed: 20
Plan Total Prescribed Dose: 50 Gy
Reference Point Dosage Given to Date: 15 Gy
Reference Point Session Dosage Given: 2.5 Gy
Session Number: 6

## 2022-03-22 ENCOUNTER — Other Ambulatory Visit: Payer: Self-pay

## 2022-03-22 ENCOUNTER — Ambulatory Visit
Admission: RE | Admit: 2022-03-22 | Discharge: 2022-03-22 | Disposition: A | Discharge status: Home / Self Care | Payer: Medicare Other | Source: Ambulatory Visit | Attending: Radiation Oncology | Admitting: Radiation Oncology

## 2022-03-22 DIAGNOSIS — C001 Malignant neoplasm of external lower lip: Secondary | ICD-10-CM | POA: Diagnosis not present

## 2022-03-22 LAB — RAD ONC ARIA SESSION SUMMARY
Course Elapsed Days: 8
Plan Fractions Treated to Date: 7
Plan Prescribed Dose Per Fraction: 2.5 Gy
Plan Total Fractions Prescribed: 20
Plan Total Prescribed Dose: 50 Gy
Reference Point Dosage Given to Date: 17.5 Gy
Reference Point Session Dosage Given: 2.5 Gy
Session Number: 7

## 2022-03-23 ENCOUNTER — Other Ambulatory Visit: Payer: Self-pay

## 2022-03-23 ENCOUNTER — Ambulatory Visit
Admission: RE | Admit: 2022-03-23 | Discharge: 2022-03-23 | Disposition: A | Discharge status: Home / Self Care | Payer: Medicare Other | Source: Ambulatory Visit | Attending: Radiation Oncology | Admitting: Radiation Oncology

## 2022-03-23 ENCOUNTER — Ambulatory Visit: Payer: Medicare Other

## 2022-03-23 DIAGNOSIS — C001 Malignant neoplasm of external lower lip: Secondary | ICD-10-CM | POA: Diagnosis not present

## 2022-03-23 LAB — RAD ONC ARIA SESSION SUMMARY
Course Elapsed Days: 9
Plan Fractions Treated to Date: 8
Plan Prescribed Dose Per Fraction: 2.5 Gy
Plan Total Fractions Prescribed: 20
Plan Total Prescribed Dose: 50 Gy
Reference Point Dosage Given to Date: 20 Gy
Reference Point Session Dosage Given: 2.5 Gy
Session Number: 8

## 2022-03-24 ENCOUNTER — Other Ambulatory Visit: Payer: Self-pay

## 2022-03-24 ENCOUNTER — Ambulatory Visit
Admission: RE | Admit: 2022-03-24 | Discharge: 2022-03-24 | Disposition: A | Discharge status: Home / Self Care | Payer: Medicare Other | Source: Ambulatory Visit | Attending: Radiation Oncology | Admitting: Radiation Oncology

## 2022-03-24 DIAGNOSIS — C001 Malignant neoplasm of external lower lip: Secondary | ICD-10-CM | POA: Diagnosis not present

## 2022-03-24 LAB — RAD ONC ARIA SESSION SUMMARY
Course Elapsed Days: 10
Plan Fractions Treated to Date: 9
Plan Prescribed Dose Per Fraction: 2.5 Gy
Plan Total Fractions Prescribed: 20
Plan Total Prescribed Dose: 50 Gy
Reference Point Dosage Given to Date: 22.5 Gy
Reference Point Session Dosage Given: 2.5 Gy
Session Number: 9

## 2022-03-25 ENCOUNTER — Ambulatory Visit
Admission: RE | Admit: 2022-03-25 | Discharge: 2022-03-25 | Disposition: A | Discharge status: Home / Self Care | Payer: Medicare Other | Source: Ambulatory Visit | Attending: Radiation Oncology | Admitting: Radiation Oncology

## 2022-03-25 ENCOUNTER — Other Ambulatory Visit: Payer: Self-pay

## 2022-03-25 DIAGNOSIS — C001 Malignant neoplasm of external lower lip: Secondary | ICD-10-CM | POA: Diagnosis not present

## 2022-03-25 LAB — RAD ONC ARIA SESSION SUMMARY
Course Elapsed Days: 11
Plan Fractions Treated to Date: 10
Plan Prescribed Dose Per Fraction: 2.5 Gy
Plan Total Fractions Prescribed: 20
Plan Total Prescribed Dose: 50 Gy
Reference Point Dosage Given to Date: 25 Gy
Reference Point Session Dosage Given: 2.5 Gy
Session Number: 10

## 2022-03-27 ENCOUNTER — Ambulatory Visit: Payer: Medicare Other

## 2022-03-28 ENCOUNTER — Ambulatory Visit
Admission: RE | Admit: 2022-03-28 | Discharge: 2022-03-28 | Disposition: A | Discharge status: Home / Self Care | Payer: Medicare Other | Source: Ambulatory Visit | Attending: Radiation Oncology | Admitting: Radiation Oncology

## 2022-03-28 ENCOUNTER — Other Ambulatory Visit: Payer: Self-pay

## 2022-03-28 ENCOUNTER — Ambulatory Visit: Payer: Medicare Other

## 2022-03-28 DIAGNOSIS — C001 Malignant neoplasm of external lower lip: Secondary | ICD-10-CM | POA: Diagnosis not present

## 2022-03-28 LAB — RAD ONC ARIA SESSION SUMMARY
Course Elapsed Days: 14
Plan Fractions Treated to Date: 11
Plan Prescribed Dose Per Fraction: 2.5 Gy
Plan Total Fractions Prescribed: 20
Plan Total Prescribed Dose: 50 Gy
Reference Point Dosage Given to Date: 27.5 Gy
Reference Point Session Dosage Given: 2.5 Gy
Session Number: 11

## 2022-03-28 MED ORDER — SONAFINE EX EMUL
1.0000 | Freq: Once | CUTANEOUS | Status: AC
  Administered 2022-03-28: 1 via TOPICAL

## 2022-03-29 ENCOUNTER — Ambulatory Visit
Admission: RE | Admit: 2022-03-29 | Discharge: 2022-03-29 | Disposition: A | Discharge status: Home / Self Care | Payer: Medicare Other | Source: Ambulatory Visit | Attending: Radiation Oncology | Admitting: Radiation Oncology

## 2022-03-29 ENCOUNTER — Other Ambulatory Visit: Payer: Self-pay

## 2022-03-29 DIAGNOSIS — C001 Malignant neoplasm of external lower lip: Secondary | ICD-10-CM | POA: Diagnosis not present

## 2022-03-29 LAB — RAD ONC ARIA SESSION SUMMARY
Course Elapsed Days: 15
Plan Fractions Treated to Date: 12
Plan Prescribed Dose Per Fraction: 2.5 Gy
Plan Total Fractions Prescribed: 20
Plan Total Prescribed Dose: 50 Gy
Reference Point Dosage Given to Date: 30 Gy
Reference Point Session Dosage Given: 2.5 Gy
Session Number: 12

## 2022-03-30 ENCOUNTER — Ambulatory Visit
Admission: RE | Admit: 2022-03-30 | Discharge: 2022-03-30 | Disposition: A | Discharge status: Home / Self Care | Payer: Medicare Other | Source: Ambulatory Visit | Attending: Radiation Oncology | Admitting: Radiation Oncology

## 2022-03-30 ENCOUNTER — Other Ambulatory Visit: Payer: Self-pay

## 2022-03-30 DIAGNOSIS — C001 Malignant neoplasm of external lower lip: Secondary | ICD-10-CM | POA: Diagnosis not present

## 2022-03-30 LAB — RAD ONC ARIA SESSION SUMMARY
Course Elapsed Days: 16
Plan Fractions Treated to Date: 13
Plan Prescribed Dose Per Fraction: 2.5 Gy
Plan Total Fractions Prescribed: 20
Plan Total Prescribed Dose: 50 Gy
Reference Point Dosage Given to Date: 32.5 Gy
Reference Point Session Dosage Given: 2.5 Gy
Session Number: 13

## 2022-04-04 ENCOUNTER — Ambulatory Visit
Admission: RE | Admit: 2022-04-04 | Discharge: 2022-04-04 | Disposition: A | Discharge status: Home / Self Care | Payer: Medicare Other | Source: Ambulatory Visit | Attending: Radiation Oncology | Admitting: Radiation Oncology

## 2022-04-04 ENCOUNTER — Other Ambulatory Visit: Payer: Self-pay

## 2022-04-04 DIAGNOSIS — C001 Malignant neoplasm of external lower lip: Secondary | ICD-10-CM | POA: Diagnosis not present

## 2022-04-04 LAB — RAD ONC ARIA SESSION SUMMARY
Course Elapsed Days: 21
Plan Fractions Treated to Date: 14
Plan Prescribed Dose Per Fraction: 2.5 Gy
Plan Total Fractions Prescribed: 20
Plan Total Prescribed Dose: 50 Gy
Reference Point Dosage Given to Date: 35 Gy
Reference Point Session Dosage Given: 2.5 Gy
Session Number: 14

## 2022-04-05 ENCOUNTER — Ambulatory Visit
Admission: RE | Admit: 2022-04-05 | Discharge: 2022-04-05 | Disposition: A | Discharge status: Home / Self Care | Payer: Medicare Other | Source: Ambulatory Visit | Attending: Radiation Oncology | Admitting: Radiation Oncology

## 2022-04-05 ENCOUNTER — Other Ambulatory Visit: Payer: Self-pay

## 2022-04-05 DIAGNOSIS — C001 Malignant neoplasm of external lower lip: Secondary | ICD-10-CM | POA: Diagnosis not present

## 2022-04-05 LAB — RAD ONC ARIA SESSION SUMMARY
Course Elapsed Days: 22
Plan Fractions Treated to Date: 15
Plan Prescribed Dose Per Fraction: 2.5 Gy
Plan Total Fractions Prescribed: 20
Plan Total Prescribed Dose: 50 Gy
Reference Point Dosage Given to Date: 37.5 Gy
Reference Point Session Dosage Given: 2.5 Gy
Session Number: 15

## 2022-04-06 ENCOUNTER — Other Ambulatory Visit: Payer: Self-pay

## 2022-04-06 ENCOUNTER — Ambulatory Visit: Payer: Medicare Other

## 2022-04-06 ENCOUNTER — Ambulatory Visit
Admission: RE | Admit: 2022-04-06 | Discharge: 2022-04-06 | Disposition: A | Discharge status: Home / Self Care | Payer: Medicare Other | Source: Ambulatory Visit | Attending: Radiation Oncology | Admitting: Radiation Oncology

## 2022-04-06 DIAGNOSIS — C001 Malignant neoplasm of external lower lip: Secondary | ICD-10-CM | POA: Diagnosis not present

## 2022-04-06 LAB — RAD ONC ARIA SESSION SUMMARY
Course Elapsed Days: 23
Plan Fractions Treated to Date: 16
Plan Prescribed Dose Per Fraction: 2.5 Gy
Plan Total Fractions Prescribed: 20
Plan Total Prescribed Dose: 50 Gy
Reference Point Dosage Given to Date: 40 Gy
Reference Point Session Dosage Given: 2.5 Gy
Session Number: 16

## 2022-04-07 ENCOUNTER — Other Ambulatory Visit: Payer: Self-pay

## 2022-04-07 ENCOUNTER — Ambulatory Visit
Admission: RE | Admit: 2022-04-07 | Discharge: 2022-04-07 | Disposition: A | Discharge status: Home / Self Care | Payer: Medicare Other | Source: Ambulatory Visit | Attending: Radiation Oncology | Admitting: Radiation Oncology

## 2022-04-07 DIAGNOSIS — C001 Malignant neoplasm of external lower lip: Secondary | ICD-10-CM | POA: Diagnosis not present

## 2022-04-07 LAB — RAD ONC ARIA SESSION SUMMARY
Course Elapsed Days: 24
Plan Fractions Treated to Date: 17
Plan Prescribed Dose Per Fraction: 2.5 Gy
Plan Total Fractions Prescribed: 20
Plan Total Prescribed Dose: 50 Gy
Reference Point Dosage Given to Date: 42.5 Gy
Reference Point Session Dosage Given: 2.5 Gy
Session Number: 17

## 2022-04-08 ENCOUNTER — Other Ambulatory Visit: Payer: Self-pay

## 2022-04-08 ENCOUNTER — Encounter (HOSPITAL_COMMUNITY): Payer: Self-pay

## 2022-04-08 ENCOUNTER — Ambulatory Visit
Admission: RE | Admit: 2022-04-08 | Discharge: 2022-04-08 | Disposition: A | Discharge status: Home / Self Care | Payer: Medicare Other | Source: Ambulatory Visit | Attending: Radiation Oncology | Admitting: Radiation Oncology

## 2022-04-08 ENCOUNTER — Ambulatory Visit (HOSPITAL_COMMUNITY)
Admission: RE | Admit: 2022-04-08 | Discharge: 2022-04-08 | Disposition: A | Discharge status: Home / Self Care | Payer: Medicare Other | Source: Ambulatory Visit | Attending: Radiation Oncology | Admitting: Radiation Oncology

## 2022-04-08 DIAGNOSIS — C001 Malignant neoplasm of external lower lip: Secondary | ICD-10-CM | POA: Diagnosis present

## 2022-04-08 LAB — RAD ONC ARIA SESSION SUMMARY
Course Elapsed Days: 25
Plan Fractions Treated to Date: 18
Plan Prescribed Dose Per Fraction: 2.5 Gy
Plan Total Fractions Prescribed: 20
Plan Total Prescribed Dose: 50 Gy
Reference Point Dosage Given to Date: 45 Gy
Reference Point Session Dosage Given: 2.5 Gy
Session Number: 18

## 2022-04-11 ENCOUNTER — Ambulatory Visit
Admission: RE | Admit: 2022-04-11 | Discharge: 2022-04-11 | Disposition: A | Discharge status: Home / Self Care | Payer: Medicare Other | Source: Ambulatory Visit | Attending: Radiation Oncology | Admitting: Radiation Oncology

## 2022-04-11 ENCOUNTER — Other Ambulatory Visit: Payer: Self-pay

## 2022-04-11 ENCOUNTER — Ambulatory Visit: Payer: Medicare Other

## 2022-04-11 DIAGNOSIS — C001 Malignant neoplasm of external lower lip: Secondary | ICD-10-CM

## 2022-04-11 LAB — RAD ONC ARIA SESSION SUMMARY
Course Elapsed Days: 28
Plan Fractions Treated to Date: 19
Plan Prescribed Dose Per Fraction: 2.5 Gy
Plan Total Fractions Prescribed: 20
Plan Total Prescribed Dose: 50 Gy
Reference Point Dosage Given to Date: 47.5 Gy
Reference Point Session Dosage Given: 2.5 Gy
Session Number: 19

## 2022-04-11 MED ORDER — SONAFINE EX EMUL
1.0000 | Freq: Two times a day (BID) | CUTANEOUS | Status: DC
  Administered 2022-04-11: 1 via TOPICAL

## 2022-04-11 NOTE — Progress Notes (Signed)
Zachary Edouard, MD  Donita Brooks D OK for US guided biopsy of submental LN.  GY

## 2022-04-12 ENCOUNTER — Ambulatory Visit: Payer: Medicare Other | Admitting: Cardiovascular Disease

## 2022-04-12 ENCOUNTER — Ambulatory Visit
Admission: RE | Admit: 2022-04-12 | Discharge: 2022-04-12 | Disposition: A | Discharge status: Home / Self Care | Payer: Medicare Other | Source: Ambulatory Visit | Attending: Radiation Oncology | Admitting: Radiation Oncology

## 2022-04-12 ENCOUNTER — Other Ambulatory Visit: Payer: Self-pay

## 2022-04-12 DIAGNOSIS — C001 Malignant neoplasm of external lower lip: Secondary | ICD-10-CM | POA: Diagnosis not present

## 2022-04-12 LAB — RAD ONC ARIA SESSION SUMMARY
Course Elapsed Days: 29
Plan Fractions Treated to Date: 20
Plan Prescribed Dose Per Fraction: 2.5 Gy
Plan Total Fractions Prescribed: 20
Plan Total Prescribed Dose: 50 Gy
Reference Point Dosage Given to Date: 50 Gy
Reference Point Session Dosage Given: 2.5 Gy
Session Number: 20

## 2022-04-12 NOTE — Progress Notes (Signed)
Oncology Nurse Navigator Documentation   Met with Mr. Farnan after final RT to offer support and to celebrate end of radiation treatment.   Provided verbal post-RT guidance: Importance of keeping all follow-up appts, especially those with Nutrition and SLP. Importance of protecting treatment area from sun. Continuation of Sonafine application 2-3 times daily, application of antibiotic ointment to areas of raw skin; when supply of Sonafine exhausted transition to OTC lotion with vitamin E. I let him know that he will be receiving a call from IR to get a biopsy scheduled on 12/27 or 12/28 per Dr. Pearlie Oyster request.   Explained my role as navigator will continue for several more months, encouraged him to call me with needs/concerns.    Harlow Asa RN, BSN, OCN Head & Neck Oncology Nurse [place] at [facility] Phone # [phone]  Fax # [phone]

## 2022-05-04 NOTE — Progress Notes (Incomplete)
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 He completed radiation treatment on 04-12-22.   Pain issues, if any: *** Using a feeding tube?: *** Weight changes, if any: *** Swallowing issues, if any: *** Smoking or chewing tobacco? *** Using fluoride trays daily? *** Last ENT visit was on: *** Other notable issues, if any: ***

## 2022-05-05 ENCOUNTER — Telehealth: Payer: Self-pay

## 2022-05-05 NOTE — Telephone Encounter (Signed)
Rn called pt back concerning an incoming call from pt. On the in coming call pt had questions about his biopsy to be done on Tuesday 1-2-2. He was wondering if the procedure could be pushed back in time at all (he wanted to come in after 6 if possible). Rn called [facility] to inquire to see if he could come later and he could not due a tight schedule and pre procedure test that needed to be done. This was explained to the pt and he understood. Pt also complained of tooth pain with this phone call. He stated he is having intermittent tooth pain with mild swelling (though reported swelling is improving), he did deny fevers/ chills/ drainage with tooth pain. Rn explained this could a normal finding based on his radiation and to call back if this persist or becomes worrisome (a dental consult may be needed at that point).  Pt stated he would call back with questions or concerns.

## 2022-05-06 ENCOUNTER — Other Ambulatory Visit: Payer: Self-pay | Admitting: Student

## 2022-05-06 DIAGNOSIS — C001 Malignant neoplasm of external lower lip: Secondary | ICD-10-CM

## 2022-05-10 ENCOUNTER — Other Ambulatory Visit: Payer: Self-pay

## 2022-05-10 ENCOUNTER — Encounter (HOSPITAL_COMMUNITY): Payer: Self-pay

## 2022-05-10 ENCOUNTER — Ambulatory Visit (HOSPITAL_COMMUNITY)
Admission: RE | Admit: 2022-05-10 | Discharge: 2022-05-10 | Disposition: A | Discharge status: Home / Self Care | Payer: Medicare Other | Source: Ambulatory Visit | Attending: Radiation Oncology | Admitting: Radiation Oncology

## 2022-05-10 DIAGNOSIS — Z85828 Personal history of other malignant neoplasm of skin: Secondary | ICD-10-CM | POA: Insufficient documentation

## 2022-05-10 DIAGNOSIS — C001 Malignant neoplasm of external lower lip: Secondary | ICD-10-CM

## 2022-05-10 DIAGNOSIS — R591 Generalized enlarged lymph nodes: Secondary | ICD-10-CM | POA: Insufficient documentation

## 2022-05-10 MED ORDER — LIDOCAINE HCL (PF) 1 % IJ SOLN
4.0000 mL | Freq: Once | INTRAMUSCULAR | Status: AC
  Administered 2022-05-10: 4 mL via INTRADERMAL

## 2022-05-10 MED ORDER — SODIUM CHLORIDE 0.9 % IV SOLN: INTRAVENOUS | Status: DC

## 2022-05-10 MED ORDER — LIDOCAINE HCL (PF) 1 % IJ SOLN
INTRAMUSCULAR | Status: AC
  Filled 2022-05-10: qty 30

## 2022-05-10 NOTE — H&P (Addendum)
Chief Complaint: Patient was seen in consultation today for squamous cell head/neck cancer--- submental lymphadenopathy biopsy at the request of [place]  Referring Physician(s): Eppie Gibson  Supervising Physician: Markus Daft  Patient Status: [facility] - Out-pt  History of Present Illness: Zachary Padilla is a 89 y.o. 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 Focal soft tissue thickening along the lower lip, consistent with the clinically suspected malignancy. 2. Increased size of a rounded right level 1A lymph node measuring 8 mm, which is indeterminate, but worrisome for nodal metstatic disease. 3. Superficial soft tissue thickening along the midline posterior neck is nonspecific. Correlate with physical exam. 4. Air-fluid level in the left sphenoid sinus, correlate for symptoms of acute sinusitis  Request for biopsy per Dr Isidore Moos Scheduled now for submental lymph node biopsy  Past Medical History:  Diagnosis Date   ACNE ROSACEA 09/09/2009   ALLERGIC RHINITIS 02/23/2009   Basal cell carcinoma 06/23/2010   R cheek- (CX35FU+ exc )   COLONIC POLYPS, HX OF 02/23/2009   Convulsions/seizures ([place]) 10/24/2014   DIVERTICULITIS, HX OF 02/23/2009   Ear infection    Family history of heart disease    GERD 02/23/2009   Hyperlipidemia    HYPERTENSION 02/23/2009   PONV (postoperative nausea and vomiting)    Posterior vitreous detachment of right eye 08/26/2019   Pre-syncope    Thoracic aortic aneurysm ([place])    URINARY INCONTINENCE 02/23/2009    Past Surgical History:  Procedure Laterality Date   APPENDECTOMY  1946   CATARACT EXTRACTION Bilateral    EYE SURGERY Right 07/25/2019   Left ear tube  2001   Removal of pre-cancer bump on neck  2005   Ruptured appendics  1946   Sinus surgery  2008    Allergies: Ace inhibitors, Aminoglycosides, Erythromycin, Iodinated  contrast media, Levofloxacin, Penicillins, Sulfa antibiotics, and Ciprofloxacin  Medications: Prior to Admission medications   Medication Sig Start Date End Date Taking? Authorizing Provider  cholecalciferol (VITAMIN D3) 25 MCG (1000 UNIT) tablet Take 1,000 Units by mouth daily.   Yes [provider]  furosemide (LASIX) 20 MG tablet Take 20 mg by mouth daily. 06/24/20  Yes [provider]  levETIRAcetam (KEPPRA) 500 MG tablet TAKE ONE-HALF TABLET IN THE MORNING AND TAKE ONE TABLET IN THE EVENING 10/07/21  Yes Millikan, Megan, NP  SIMPLY SALINE NA Place 1 spray into the nose 2 (two) times daily.   Yes [provider]     Family History  Problem Relation Age of Onset   Heart disease Mother    Stroke Father    Heart disease Father    Cancer Sister    Cancer Brother    Heart disease Other    Stroke Other    Seizures Neg Hx     Social History   Socioeconomic History   Marital status: Married    Spouse name: Not on file   Number of children: 2   Years of education: 15   Highest education level: Not on file  Occupational History   Occupation: retired  Tobacco Use   Smoking status: Former   Smokeless tobacco: Never   Tobacco comments:    Lives with spouse-independant in all ADLs. [employer] and [employer] at [employer], Married in "52"  Vaping Use   Vaping Use: Never used  Substance and Sexual Activity   Alcohol use: No   Drug use: No  Sexual activity: Yes  Other Topics Concern   Not on file  Social History Narrative   Patient occasionally drinks tea.   Patient is right handed.   Social Determinants of Health   Financial Resource Strain: Not on file  Food Insecurity: Not on file  Transportation Needs: Not on file  Physical Activity: Not on file  Stress: Not on file  Social Connections: Not on file    Review of Systems: A 12 point ROS discussed and pertinent positives are indicated in the HPI above.  All other systems are  negative.  Review of Systems  Constitutional:  Negative for activity change, fatigue and fever.  HENT:  Negative for sore throat and trouble swallowing.   Respiratory:  Negative for cough and shortness of breath.   Cardiovascular:  Negative for chest pain.  Gastrointestinal:  Negative for abdominal pain and nausea.  Psychiatric/Behavioral:  Negative for behavioral problems and confusion.     Vital Signs: BP (!) 160/91   Pulse (!) 51   Temp 97.6 F (36.4 C)   Resp 17   Ht '5\' 10"'$  (1.778 m)   Wt 154 lb (69.9 kg)   SpO2 98%   BMI 22.10 kg/m    Physical Exam Vitals reviewed.  HENT:     Mouth/Throat:     Mouth: Mucous membranes are moist.  Neck:     Comments: Palpable submental node; NT  Cardiovascular:     Rate and Rhythm: Normal rate and regular rhythm.     Heart sounds: Normal heart sounds.  Pulmonary:     Effort: Pulmonary effort is normal.     Breath sounds: Normal breath sounds.  Abdominal:     Palpations: Abdomen is soft.  Musculoskeletal:        General: Normal range of motion.  Skin:    General: Skin is warm.  Neurological:     Mental Status: He is alert and oriented to person, place, and time.  Psychiatric:        Behavior: Behavior normal.     Imaging: No results found.  Labs:  CBC: No results for input(s): "WBC", "HGB", "HCT", "PLT" in the last 8760 hours.  COAGS: No results for input(s): "INR", "APTT" in the last 8760 hours.  BMP: No results for input(s): "NA", "K", "CL", "CO2", "GLUCOSE", "BUN", "CALCIUM", "CREATININE", "GFRNONAA", "GFRAA" in the last 8760 hours.  Invalid input(s): "CMP"  LIVER FUNCTION TESTS: No results for input(s): "BILITOT", "AST", "ALT", "ALKPHOS", "PROT", "ALBUMIN" in the last 8760 hours.  TUMOR MARKERS: No results for input(s): "AFPTM", "CEA", "CA199", "CHROMGRNA" in the last 8760 hours.  Assessment and Plan:  Known squamous cell skin cancer Follows with Dr Isidore Moos New lymphadenopathy on Dec 2023 CT Scheduled  now for biopsy of submental Lymph node Risks and benefits of submental lymph node biopsy was discussed with the patient and/or patient's family including, but not limited to bleeding, infection, damage to adjacent structures or low yield requiring additional tests.  All of the questions were answered and there is agreement to proceed.  Consent signed and in chart.  Thank you for this interesting consult.  I greatly enjoyed meeting Zachary Padilla and look forward to participating in their care.  A copy of this report was sent to the requesting provider on this date.  Electronically Signed: Lavonia Drafts, PA-C 05/10/2022, 7:26 AM   I spent a total of  30 Minutes   in face to face in clinical consultation, greater than 50% of which was counseling/coordinating care  for submental LN bx

## 2022-05-10 NOTE — Procedures (Signed)
Interventional Radiology Procedure:   Indications: Squamous cell carcinoma, submental lymph node enlargement  Procedure: US guided lymph node biopsy  Findings: Enlarged submental lymph node, multiple core biopsies obtained.   Complications: No immediate complications noted.     EBL: Minimal  Plan: Discharge to home.    Corene Resnick R. Anselm Pancoast, MD  Pager: [phone]

## 2022-05-11 ENCOUNTER — Encounter: Payer: Self-pay | Admitting: Radiation Oncology

## 2022-05-11 ENCOUNTER — Ambulatory Visit (HOSPITAL_COMMUNITY): Payer: Medicare Other | Attending: Cardiovascular Disease

## 2022-05-11 ENCOUNTER — Ambulatory Visit: Payer: Medicare Other | Admitting: Radiation Oncology

## 2022-05-11 DIAGNOSIS — I059 Rheumatic mitral valve disease, unspecified: Secondary | ICD-10-CM | POA: Insufficient documentation

## 2022-05-11 LAB — SURGICAL PATHOLOGY

## 2022-05-11 NOTE — Progress Notes (Signed)
Phone note:  This is a really nice 89 year old gentleman who received adjuvant radiation therapy to his left lip vermilion border for a squamous skin cancer.  He developed a palpable node in the right submental region during radiation and while I suspected it could be reactive due to his mucositis, we got a biopsy yesterday, soon after he finished radiation, out of caution. This showed squamous cell carcinoma.  I called the patient today to give him the results.  I let him know that I am referring him to a local ENT surgeon to discuss neck dissection.  I let him know that it appears we caught the lymph node involvement about as early as possible and I am hopeful that he will be a surgical candidate for cure.  If he is not, the less standard option would be radiation therapy.  We will also discuss him at our tumor board and order PET staging scans if ENT does not feel that his recent neck CT is sufficient.  Zachary Padilla thanked me for my call and I encouraged him to still come in for his follow-up next week so I can make sure his lip is healing and answer any further questions that he or his family may have.  -----------------------------------  Zachary Gibson, MD

## 2022-05-12 ENCOUNTER — Other Ambulatory Visit: Payer: Self-pay

## 2022-05-12 DIAGNOSIS — C001 Malignant neoplasm of external lower lip: Secondary | ICD-10-CM

## 2022-05-12 LAB — ECHOCARDIOGRAM COMPLETE
Area-P 1/2: 2.33 cm2
MV M vel: 4.61 m/s
MV Peak grad: 85 mmHg
P 1/2 time: 748 msec
Radius: 0.7 cm
S' Lateral: 2.4 cm

## 2022-05-13 ENCOUNTER — Ambulatory Visit: Payer: Self-pay | Admitting: Radiation Oncology

## 2022-05-13 NOTE — Progress Notes (Signed)
Oncology Nurse Navigator Documentation   At Dr. Pearlie Oyster request I contacted Pathology and spoke to [facility]. I requested that an addendum including ECE be added to [name]'s pathology report. [name] will request this and have the addendum added.  Harlow Asa RN, BSN, OCN Head & Neck Oncology Nurse [place] at [facility] Phone # [phone]  Fax # [phone]

## 2022-05-13 NOTE — Radiation Completion Notes (Signed)
Patient Name: JACQUELYN, ANTONY MRN: [id number] Date of Birth: 01-24-1929 Referring Physician: Janan Ridge, M.D. Date of Service: 2022-05-13 Radiation Oncologist: Eppie Gibson, M.D. [place]                             Radiation Oncology End of Treatment Note     Diagnosis: C00.1 Malignant neoplasm of external lower lip Staging on 2022-03-01: Cancer of lower lip, vermilion border T=pT3, N=pN0, M=cM0 Intent: Curative     ==========DELIVERED PLANS==========  First Treatment Date: 2022-03-14 - Last Treatment Date: 2022-04-12   Plan Name: HN_LowLip_BO Site: Lip, Lower Technique: Electron Mode: Electron Dose Per Fraction: 2.5 Gy Prescribed Dose (Delivered / Prescribed): 50 Gy / 50 Gy Prescribed Fxs (Delivered / Prescribed): 20 / 20     ==========ON TREATMENT VISIT DATES========== 2022-03-14, 2022-03-28, 2022-04-04, 2022-04-06, 2022-04-08, 2022-04-11     ==========UPCOMING VISITS========== 2024-01-09T19:40:00Z [place] [facility] Out[facility] Venetia Night, MD; Eppie Gibson, MD        ==========APPENDIX - ON TREATMENT VISIT NOTES==========   PatEd 2022-03-14 Ongoing education performed.   ImpPlan 2022-03-14 The patient is tolerating radiation. Continue treatment as planned.   PhysExam 2022-03-14 Alert, no acute distress.   ProgNote 2022-03-14 Changes from last week/visit? [ No ] Pain? [ No ] Dysphagia? [ No ] Thick saliva/mouth irritation? [ Yes- Mild ] Mouth ulcers? [ No ] PEG tube? Any issues? [ No ] Have they received chemo (at any point during their radiation treatment)? [ No ] Taking anything by mouth or all via PEG? [ Yes - solid and liquids ] How much clear fluid are they taking in? [ 80oz daily ] Are they doing their salt/baking soda rinses? [ No - nursing reinforced patient education ] Need refill on lotions? [ No ] Need refills: [ No ] Additional  Weekly Progress Notes [ Skin irritation to LT lower lip.   ]    RunningNotes 2022-03-14 Education given 03/14/22 by Sara Chu 2022-03-21 Ongoing education performed.   ImpPlan 2022-03-21 The patient is tolerating radiation. Continue treatment as planned.   PhysExam 2022-03-21 Alert, no acute distress.   ProgNote 2022-03-21 Changes from last week/visit? [ Yes, more mouth burning this week ] Pain? [ Yes, mouth burning ] Dysphagia? [ No ] Thick saliva/mouth irritation? [ Yes ] Mouth ulcers? [ Yes ] PEG tube? Any issues? [ No ] Have they received chemo (at any point during their radiation treatment)? [ No ] Taking anything by mouth or all via PEG? [ na ] How much clear fluid are they taking in? [ [place].Ranks  ] Are they doing their salt/baking soda rinses? [ Yes ] Need refill on lotions? [ Yes ] Need refills: [ No ] Additional  Weekly Progress Notes [ no questions or concerns.  ]    PatEd 2022-03-28 Ongoing education performed.   ImpPlan 2022-03-28 The patient is tolerating radiation. Continue treatment as planned.   PhysExam 2022-03-28 Alert, no acute distress.   ProgNote 2022-03-28 Changes from last week/visit? [ No ] Pain? [ Tenderness/sensitivity to inside of lower lip ] Dysphagia? [ No; does have some discomfort with chewing/eating solid foods ] Thick saliva/mouth irritation? [ No ] Mouth ulcers? [ Yes; inside of lower lip ] PEG tube? Any issues? [ No; N/A ] Have they received chemo (at any point during their radiation treatment)? [ No ] Taking anything by mouth or all via PEG? [ Yes -  solid and liquids ] How much clear fluid are they taking in? [ ~48 oz ] Are they doing their salt/baking soda rinses? [ Yes ] Need refill on lotions? [ No ] Need refills: [ No ] Additional  Weekly Progress Notes [  ]    PatEd 2022-04-04 Ongoing education performed.   ImpPlan 2022-04-04 The patient is tolerating radiation. Continue treatment as planned.   PhysExam 2022-04-04 Alert, no acute distress.   ProgNote 2022-04-04 Changes from last  week/visit? [ No ] Pain? [ Yes- Skin tenderness, 5/10. ] Dysphagia? [ No ] Thick saliva/mouth irritation? [ Yes ] Mouth ulcers? [ Yes ] PEG tube? Any issues? [ No ] Have they received chemo (at any point during their radiation treatment)? [ No ] Taking anything by mouth or all via PEG? [ Yes - solid and liquids ] How much clear fluid are they taking in? [ 50oz daily ] Are they doing their salt/baking soda rinses? [ No - nursing reinforced patient education ] Need refill on lotions? [ No ] Need refills: [ No ] Additional  Weekly Progress Notes [  ]    PatEd 2022-04-06 Ongoing education performed.   ImpPlan 2022-04-06 The patient is tolerating radiation. Continue treatment as planned.   PhysExam 2022-04-06 Alert, no acute distress.   ProgNote 2022-04-06 Changes from last week/visit? [ Yes, lip hurting more ] Pain? [ Yes, at lip ] Dysphagia? [ No ] Thick saliva/mouth irritation? [ Yes ] Mouth ulcers? [ No ] PEG tube? Any issues? [ no ] Have they received chemo (at any point during their radiation treatment)? [ no ] Taking anything by mouth or all via PEG? [ na ] How much clear fluid are they taking in? [ 48 oz.  ] Are they doing their salt/baking soda rinses? [ Yes ] Need refill on lotions? [ Yes ] Need refills: [ No ] Additional  Weekly Progress Notes [ lip blistering still a concern ]    PatEd 2022-04-08 Ongoing education performed.   ImpPlan 2022-04-08 The patient is tolerating radiation. Continue treatment as planned.   PhysExam 2022-04-08 Alert, no acute distress.   ProgNote 2022-04-08 Need refills: [  ] Additional  Weekly Progress Notes [ Patient requested to be seen before treatment today. He noticed a small lump under the right side of his chin (denies pain or fevers). Patient and daughter would like to know if he needs to receive remaining 3 treatments or has Dr. Pearlie Oyster approval to stop early ]    PatEd 2022-04-11 Ongoing education performed.   ImpPlan 2022-04-11 The  patient is tolerating radiation. Continue treatment as planned.   PhysExam 2022-04-11 Alert, no acute distress.   ProgNote 2022-04-11 Changes from last week/visit? [ No ] Pain? [ Yes, back pain ] Dysphagia? [ No ] Thick saliva/mouth irritation? [ Yes ] Mouth ulcers? [ Yes, still sore ] PEG tube? Any issues? [ No ] Have they received chemo (at any point during their radiation treatment)? [ No ] Taking anything by mouth or all via PEG? [ na ] How much clear fluid are they taking in? [ 40oz. ] Are they doing their salt/baking soda rinses? [ Yes ] Need refill on lotions? [ Yes ] Need refills: [ No ] Additional  Weekly Progress Notes [ concerned with lump under chin ]

## 2022-05-16 ENCOUNTER — Ambulatory Visit: Payer: Medicare Other | Attending: Nurse Practitioner | Admitting: Nurse Practitioner

## 2022-05-16 ENCOUNTER — Encounter: Payer: Self-pay | Admitting: Nurse Practitioner

## 2022-05-16 VITALS — BP 118/70 | HR 92 | Ht 70.0 in | Wt 156.4 lb

## 2022-05-16 DIAGNOSIS — I10 Essential (primary) hypertension: Secondary | ICD-10-CM

## 2022-05-16 DIAGNOSIS — I340 Nonrheumatic mitral (valve) insufficiency: Secondary | ICD-10-CM

## 2022-05-16 DIAGNOSIS — N183 Chronic kidney disease, stage 3 unspecified: Secondary | ICD-10-CM

## 2022-05-16 DIAGNOSIS — R600 Localized edema: Secondary | ICD-10-CM

## 2022-05-16 DIAGNOSIS — I4510 Unspecified right bundle-branch block: Secondary | ICD-10-CM

## 2022-05-16 DIAGNOSIS — I712 Thoracic aortic aneurysm, without rupture, unspecified: Secondary | ICD-10-CM | POA: Diagnosis not present

## 2022-05-16 DIAGNOSIS — E782 Mixed hyperlipidemia: Secondary | ICD-10-CM

## 2022-05-16 NOTE — Patient Instructions (Signed)
Medication Instructions:  Your physician recommends that you continue on your current medications as directed. Please refer to the Current Medication list given to you today.   *If you need a refill on your cardiac medications before your next appointment, please call your pharmacy*   Lab Work: NONE ordered at this time of appointment   If you have labs (blood work) drawn today and your tests are completely normal, you will receive your results only by: MyChart Message (if you have MyChart) OR A paper copy in the mail If you have any lab test that is abnormal or we need to change your treatment, we will call you to review the results.   Testing/Procedures: NONE ordered at this time of appointment    Follow-Up: At [facility], you and your health needs are our priority.  As part of our continuing mission to provide you with exceptional heart care, we have created designated Provider Care Teams.  These Care Teams include your primary Cardiologist (physician) and Advanced Practice Providers (APPs -  Physician Assistants and Nurse Practitioners) who all work together to provide you with the care you need, when you need it.  We recommend signing up for the patient portal called "MyChart".  Sign up information is provided on this After Visit Summary.  MyChart is used to connect with patients for Virtual Visits (Telemedicine).  Patients are able to view lab/test results, encounter notes, upcoming appointments, etc.  Non-urgent messages can be sent to your provider as well.   To learn more about what you can do with MyChart, go to [email or web address].    Your next appointment:   4-6 month(s)  The format for your next appointment:   In Person  Provider:   Jonathan Berry, MD     Other Instructions   Important Information About Sugar       

## 2022-05-16 NOTE — Progress Notes (Signed)
Office Visit    Patient Name: Zachary Padilla Date of Encounter: 05/16/2022  Primary Care Provider:  Lajean Manes, MD Primary Cardiologist:  Quay Burow, MD  Chief Complaint    89 year old male with a history of hypertension, hyperlipidemia, RBBB, thoracic aortic aneurysm, and family history of CAD who presents for follow-up related to hypertension and lower extremity edema.   Past Medical History    Past Medical History:  Diagnosis Date   ACNE ROSACEA 09/09/2009   ALLERGIC RHINITIS 02/23/2009   Basal cell carcinoma 06/23/2010   R cheek- (CX35FU+ exc )   COLONIC POLYPS, HX OF 02/23/2009   Convulsions/seizures ([place]) 10/24/2014   DIVERTICULITIS, HX OF 02/23/2009   Ear infection    Family history of heart disease    GERD 02/23/2009   Hyperlipidemia    HYPERTENSION 02/23/2009   PONV (postoperative nausea and vomiting)    Posterior vitreous detachment of right eye 08/26/2019   Pre-syncope    Thoracic aortic aneurysm ([place])    URINARY INCONTINENCE 02/23/2009   Past Surgical History:  Procedure Laterality Date   APPENDECTOMY  1946   CATARACT EXTRACTION Bilateral    EYE SURGERY Right 07/25/2019   Left ear tube  2001   Removal of pre-cancer bump on neck  2005   Ruptured appendics  1946   Sinus surgery  2008    Allergies  Allergies  Allergen Reactions   Ace Inhibitors Other (See Comments)    unknown   Aminoglycosides     Unknown reaction   Erythromycin Nausea And Vomiting   Iodinated Contrast Media     Cannot Have do to decreased kidney function.    Levofloxacin Other (See Comments)    dehydration   Penicillins Nausea And Vomiting   Sulfa Antibiotics     Unknown reaction    Ciprofloxacin Hives and Rash     Labs/Other Studies Reviewed    The following studies were reviewed today: Echocardiogram 05/11/2022: IMPRESSIONS   1. Left ventricular ejection fraction, by estimation, is 60 to 65%. The  left ventricle has normal function. The left ventricle has no  regional  wall motion abnormalities. Left ventricular diastolic parameters are  consistent with Grade II diastolic  dysfunction (pseudonormalization). The average left ventricular global  longitudinal strain is -19.9 %. The global longitudinal strain is normal.   2. Right ventricular systolic function is normal. The right ventricular  size is normal. There is normal pulmonary artery systolic pressure.   3. Left atrial size was mildly dilated.   4. The mitral valve is normal in structure. Moderate mitral valve  regurgitation. No evidence of mitral stenosis.   5. Tricuspid valve regurgitation is mild to moderate.   6. The aortic valve is tricuspid. Aortic valve regurgitation is mild.   7. Aortic dilatation noted. There is mild dilatation of the ascending  aorta, measuring 43 mm.   8. The inferior vena cava is normal in size with greater than 50%  respiratory variability, suggesting right atrial pressure of 3 mmHg.   Comparison(s): No significant change from prior study. Ascending aorta 43  mm (prior 42), MR unchanged.   Recent Labs: No results found for requested labs within last 365 days.  Recent Lipid Panel    Component Value Date/Time   CHOL 123 03/22/2016 1128   TRIG 52 03/22/2016 1128   HDL 50 03/22/2016 1128   CHOLHDL 2.5 03/22/2016 1128   VLDL 10 03/22/2016 1128   LDLCALC 63 03/22/2016 1128    History of Present Illness  89 year old male with the above past medical history including hypertension, hyperlipidemia, RBBB, thoracic aortic aneurysm, and family history of CAD.   Myoview stress test in 2013 was normal. Chest CT angiogram in November 2013 for evaluation of potential lung cancer incidentally revealed a 4.3 cm descending thoracic aortic aneurysm. Most recent CT angio chest/aorta in July 2017 showed descending thoracic aortic aneurysm was unchanged from prior studies. Echocardiogram in February 2023 showed EF 60 to 65%, normal LV function, moderate mitral valve  regurgitation, mild aortic valve regurgitation, dilation of the aortic root and ascending aorta measuring 42 mm each.  He was last seen in the office on 11/16/2021 and was stable from a cardiac standpoint. Repeat echo in 05/2022 revealed EF 60 to 65%, normal LV function, no RWMA, G2 DD, normal RV systolic function, moderate mitral valve regurgitation, mild to moderate tricuspid valve regurgitation, mild aortic valve regurgitation, mild dilation of ascending aorta measuring 43 mm, no significant change from prior study.   He presents today for follow-up accompanied by his wife. Since his last visit he has been stable from a cardiac standpoint.  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  He is scheduled for a PET scan on 05/27/2022.  He denies any chest pain, dyspnea, palpitations.  He notes rare intermittent dizziness, denies presyncope, syncope.  He has stable nonpitting bilateral lower extremity edema.  He denies PND, orthopnea, weight gain.  From a cardiac standpoint, overall, he reports feeling well.  Home Medications    Current Outpatient Medications  Medication Sig Dispense Refill   cholecalciferol (VITAMIN D3) 25 MCG (1000 UNIT) tablet Take 1,000 Units by mouth daily.     furosemide (LASIX) 20 MG tablet Take 20 mg by mouth daily.     levETIRAcetam (KEPPRA) 500 MG tablet TAKE ONE-HALF TABLET IN THE MORNING AND TAKE ONE TABLET IN THE EVENING 135 tablet 3   SIMPLY SALINE NA Place 1 spray into the nose 2 (two) times daily.     No current facility-administered medications for this visit.     Review of Systems    He denies chest pain, palpitations, dyspnea, pnd, orthopnea, n, v, dizziness, syncope, edema, weight gain, or early satiety. All other systems reviewed and are otherwise negative except as noted above.   Physical Exam    VS:  BP 118/70    Pulse 92   Ht '5\' 10"'$  (1.778 m)   Wt 156 lb 6.4 oz (70.9 kg)   SpO2 96%   BMI 22.44 kg/m  GEN: Well nourished, well developed, in no acute distress. HEENT: normal. Neck: Supple, no JVD, carotid bruits, or masses. Cardiac: RRR, no murmurs, rubs, or gallops. No clubbing, cyanosis, nonpitting ankle edema.  Radials/DP/PT 2+ and equal bilaterally.  Respiratory:  Respirations regular and unlabored, clear to auscultation bilaterally. GI: Soft, nontender, nondistended, BS + x 4. MS: no deformity or atrophy. Skin: warm and dry, no rash. Neuro:  Strength and sensation are intact. Psych: Normal affect.  Accessory Clinical Findings    ECG personally reviewed by me today -sinus rhythm, 91bpm, first-degree AV block, LAD, RBBB, PVCs- no acute changes.   Lab Results  Component Value Date   WBC 5.5 03/22/2016   HGB 14.3 03/22/2016   HCT 41.9 03/22/2016   MCV 96.5 03/22/2016   PLT 160 03/22/2016   Lab Results  Component Value Date  CREATININE 1.27 (H) 03/22/2016   BUN 17 03/22/2016   NA 134 (L) 03/22/2016   K 4.7 03/22/2016   CL 101 03/22/2016   CO2 27 03/22/2016   Lab Results  Component Value Date   ALT 15 03/22/2016   AST 22 03/22/2016   ALKPHOS 73 03/22/2016   BILITOT 1.0 03/22/2016   Lab Results  Component Value Date   CHOL 123 03/22/2016   HDL 50 03/22/2016   LDLCALC 63 03/22/2016   TRIG 52 03/22/2016   CHOLHDL 2.5 03/22/2016    No results found for: "HGBA1C"  Assessment & Plan   1. Bilateral lower extremity edema: Echo in 05/2022 revealed EF 60 to 65%, normal LV function, no RWMA, G2 DD, normal RV systolic function, moderate mitral valve regurgitation, mild to moderate tricuspid valve regurgitation, mild aortic valve regurgitation, mild dilation of ascending aorta measuring 43 mm, no significant change from prior study.  Stable nonpitting bilateral ankle edema. Overall euvolemic and well compensated on exam.  Continue Lasix.  2. Hypertension: BP well controlled.  He  only takes Lasix 20 mg daily.   3. Thoracic aortic aneurysm: Measured 43 mm on most recent echocardiogram. We will continue to monitor with routine echocardiograms given need for routine echocardiogram with history of moderate mitral valve regurgitation as below.   4. Moderate mitral valve regurgitation: Most recent echo as above (stable). Plan for repeat echo in 1 year.   5. RBBB: Chronic.   6. Hyperlipidemia: He has a documented history of hyperlipidemia, no recent LDL on file.  He is not on a statin.  Monitored and managed per PCP.   7. CKD Stage III: Creatinine was 1.68 in 03/2022.  Follows with nephrology.   8. Disposition: Follow-up in 4-6 months with Dr. Gwenlyn Found.      Lenna Sciara, NP 05/16/2022, 11:56 AM

## 2022-05-17 ENCOUNTER — Ambulatory Visit
Admission: RE | Admit: 2022-05-17 | Discharge: 2022-05-17 | Disposition: A | Discharge status: Home / Self Care | Payer: Medicare Other | Source: Ambulatory Visit | Attending: Radiation Oncology | Admitting: Radiation Oncology

## 2022-05-17 DIAGNOSIS — C001 Malignant neoplasm of external lower lip: Secondary | ICD-10-CM

## 2022-05-17 NOTE — Progress Notes (Incomplete)
Mr. Ouellette presents today for follow up for lip cancer. He completed radiation treatment on 04-12-22.    Pain issues, if any: yes, right neck pain when laying down on right side Using a feeding tube?: no Weight changes, if any: none Wt Readings from Last 3 Encounters:  05/18/22 158 lb 6.4 oz (71.8 kg)  05/16/22 156 lb 6.4 oz (70.9 kg)  05/10/22 154 lb (69.9 kg)    Swallowing issues, if any: none to report Smoking or chewing tobacco? none Using fluoride trays daily? none Last ENT visit was on: 06-03-21 with Dr. Dorma Russell, trying to get in with Dr. Irene Limbo Other notable issues, if any: not drinking boost or ensures at this point, neck swelling present  Vitals:   05/18/22 1122  BP: 130/69  Pulse: 71  Resp: 20  Temp: 97.6 F (36.4 C)  SpO2: 99%

## 2022-05-17 NOTE — Progress Notes (Incomplete)
Radiation Oncology         [phone]) [phone] ________________________________  Name: Zachary Padilla MRN: [id number]  Date: 05/17/2022  DOB: 15-Mar-1929  Follow-Up Visit Note  CC: Zachary Manes, MD  Zachary Night, MD  Diagnosis and Prior Radiotherapy:       ICD-10-CM   1. 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   Narrative:  The patient returns today for routine follow-up. Of note, at his last follow up a palpable node in the right submental region developed during radiation. Biopsy on 05/10/22 unfortunately revealed squamous cell carcinoma. Patient was referred to a local ENT surgeon to discuss neck dissection***. He was discussed at tumor board and it was decided to order a PET to evaluate for disease elsewhere. PET is scheduled for 05/27/22.   ALLERGIES:  is allergic to ace inhibitors, aminoglycosides, erythromycin, iodinated contrast media, levofloxacin, penicillins, sulfa antibiotics, and ciprofloxacin.  Meds: Current Outpatient Medications  Medication Sig Dispense Refill   cholecalciferol (VITAMIN D3) 25 MCG (1000 UNIT) tablet Take 1,000 Units by mouth daily.     furosemide (LASIX) 20 MG tablet Take 20 mg by mouth daily.     levETIRAcetam (KEPPRA) 500 MG tablet TAKE ONE-HALF TABLET IN THE MORNING AND TAKE ONE TABLET IN THE EVENING 135 tablet 3   SIMPLY SALINE NA Place 1 spray into the nose 2 (two) times daily.     No current facility-administered medications for this encounter.    Physical Findings: The patient is in no acute distress. Patient is alert and oriented. Wt Readings from Last 3 Encounters:  05/16/22 156 lb 6.4 oz (70.9 kg)  05/10/22 154 lb (69.9 kg)  03/01/22 160 lb 2 oz (72.6 kg)    vitals were not taken for this visit. .  General: Alert and oriented, in no acute distress HEENT: Head is normocephalic. Extraocular movements are intact. Oropharynx is notable for  *** Neck: Neck is notable for *** Skin: Skin in treatment fields shows satisfactory healing *** Heart: Regular in rate and rhythm with no murmurs, rubs, or gallops. Chest: Clear to auscultation bilaterally, with no rhonchi, wheezes, or rales. Abdomen: Soft, nontender, nondistended, with no rigidity or guarding. Extremities: No cyanosis or edema. Lymphatics: see Neck Exam Psychiatric: Judgment and insight are intact. Affect is appropriate.   Lab Findings: Lab Results  Component Value Date   WBC 5.5 03/22/2016   HGB 14.3 03/22/2016   HCT 41.9 03/22/2016   MCV 96.5 03/22/2016   PLT 160 03/22/2016    Lab Results  Component Value Date   TSH 1.16 03/22/2016    Radiographic Findings: ECHOCARDIOGRAM COMPLETE  Result Date: 05/12/2022    ECHOCARDIOGRAM REPORT   Patient Name:   Zachary Padilla  Date of Exam: 05/11/2022 Medical Rec #:  [id number]     Height:       70.0 in Accession #:    [id number]    Weight:       154.0 lb Date of Birth:  [birth year]     BSA:          1.868 m Patient Age:    89 years      BP:           157/88 mmHg Patient Gender: M             HR:           61 bpm.  Exam Location:  [facility] Procedure: 2D Echo, Cardiac Doppler, Color Doppler and Strain Analysis Indications:    I05.9 Mitral Valve disorder  History:        Patient has prior history of Echocardiogram examinations, most                 recent 06/09/2021. Arrythmias:RBBB; Risk Factors:Hypertension, HLD                 and Sleep Apnea.  Sonographer:    Marygrace Drought RCS Referring Phys: [place]  1. Left ventricular ejection fraction, by estimation, is 60 to 65%. The left ventricle has normal function. The left ventricle has no regional wall motion abnormalities. Left ventricular diastolic parameters are consistent with Grade II diastolic dysfunction (pseudonormalization). The average left ventricular global longitudinal strain is -19.9 %. The global longitudinal strain is normal.  2. Right ventricular  systolic function is normal. The right ventricular size is normal. There is normal pulmonary artery systolic pressure.  3. Left atrial size was mildly dilated.  4. The mitral valve is normal in structure. Moderate mitral valve regurgitation. No evidence of mitral stenosis.  5. Tricuspid valve regurgitation is mild to moderate.  6. The aortic valve is tricuspid. Aortic valve regurgitation is mild.  7. Aortic dilatation noted. There is mild dilatation of the ascending aorta, measuring 43 mm.  8. The inferior vena cava is normal in size with greater than 50% respiratory variability, suggesting right atrial pressure of 3 mmHg. Comparison(s): No significant change from prior study. Ascending aorta 43 mm (prior 42), MR unchanged. FINDINGS  Left Ventricle: Left ventricular ejection fraction, by estimation, is 60 to 65%. The left ventricle has normal function. The left ventricle has no regional wall motion abnormalities. The average left ventricular global longitudinal strain is -19.9 %. The global longitudinal strain is normal. The left ventricular internal cavity size was normal in size. There is no left ventricular hypertrophy. Left ventricular diastolic parameters are consistent with Grade II diastolic dysfunction (pseudonormalization). Right Ventricle: The right ventricular size is normal. No increase in right ventricular wall thickness. Right ventricular systolic function is normal. There is normal pulmonary artery systolic pressure. The tricuspid regurgitant velocity is 2.87 m/s, and  with an assumed right atrial pressure of 3 mmHg, the estimated right ventricular systolic pressure is 67.3 mmHg. Left Atrium: Left atrial size was mildly dilated. Right Atrium: Right atrial size was normal in size. Pericardium: There is no evidence of pericardial effusion. Mitral Valve: PISA: MR radius 0.7 cm, ERO 0.21 cm2, volume 39 ml. The mitral valve is normal in structure. Moderate mitral valve regurgitation. No evidence of mitral  valve stenosis. Tricuspid Valve: The tricuspid valve is normal in structure. Tricuspid valve regurgitation is mild to moderate. No evidence of tricuspid stenosis. Aortic Valve: The aortic valve is tricuspid. Aortic valve regurgitation is mild. Aortic regurgitation PHT measures 748 msec. Pulmonic Valve: The pulmonic valve was grossly normal. Pulmonic valve regurgitation is mild. No evidence of pulmonic stenosis. Aorta: Aortic dilatation noted. There is mild dilatation of the ascending aorta, measuring 43 mm. Venous: The inferior vena cava is normal in size with greater than 50% respiratory variability, suggesting right atrial pressure of 3 mmHg. IAS/Shunts: The atrial septum is grossly normal.  LEFT VENTRICLE PLAX 2D LVIDd:         4.50 cm   Diastology LVIDs:         2.40 cm   LV e' medial:    5.33 cm/s LV  PW:         0.90 cm   LV E/e' medial:  13.3 LV IVS:        0.90 cm   LV e' lateral:   4.03 cm/s LVOT diam:     1.80 cm   LV E/e' lateral: 17.5 LV SV:         54 LV SV Index:   29        2D Longitudinal Strain LVOT Area:     2.54 cm  2D Strain GLS (A2C):   -18.2 %                          2D Strain GLS (A3C):   -22.9 %                          2D Strain GLS (A4C):   -18.5 %                          2D Strain GLS Avg:     -19.9 % RIGHT VENTRICLE RV Basal diam:  3.60 cm RV S prime:     12.70 cm/s TAPSE (M-mode): 2.4 cm RVSP:           35.9 mmHg LEFT ATRIUM             Index        RIGHT ATRIUM           Index LA diam:        3.30 cm 1.77 cm/m   RA Pressure: 3.00 mmHg LA Vol (A2C):   62.8 ml 33.62 ml/m  RA Area:     18.30 cm LA Vol (A4C):   47.2 ml 25.27 ml/m  RA Volume:   42.30 ml  22.65 ml/m LA Biplane Vol: 58.9 ml 31.53 ml/m  AORTIC VALVE LVOT Vmax:   93.80 cm/s LVOT Vmean:  62.900 cm/s LVOT VTI:    0.212 m AI PHT:      748 msec  AORTA Ao Root diam: 4.10 cm Ao Asc diam:  4.30 cm MITRAL VALVE                  TRICUSPID VALVE MV Area (PHT):                TR Peak grad:   32.9 mmHg MV Decel Time:                 TR Vmax:        287.00 cm/s MR Peak grad:    85.0 mmHg    Estimated RAP:  3.00 mmHg MR Mean grad:    60.0 mmHg    RVSP:           35.9 mmHg MR Vmax:         461.00 cm/s MR Vmean:        366.0 cm/s   SHUNTS MR PISA:         3.08 cm     Systemic VTI:  0.21 m MR PISA Eff ROA: 21 mm       Systemic Diam: 1.80 cm MR PISA Radius:  0.70 cm MV E velocity: 70.70 cm/s MV A velocity: 80.70 cm/s MV E/A ratio:  0.88 Buford Dresser MD Electronically signed by Buford Dresser MD Signature Date/Time: 05/12/2022/7:09:19 AM    Final    [place] CORE BIOPSY (LYMPH NODES)  Result Date:  05/10/2022 INDICATION: 89 year old with history of squamous cell carcinoma and an enlarged submental lymph node. EXAM: ULTRASOUND-GUIDED BIOPSY OF SUBMENTAL LYMPH NODE MEDICATIONS: 1% lidocaine for local anesthetic ANESTHESIA/SEDATION: None FLUOROSCOPY TIME:  None COMPLICATIONS: None immediate. PROCEDURE: Informed written consent was obtained from the patient after a thorough discussion of the procedural risks, benefits and alternatives. All questions were addressed. A timeout was performed prior to the initiation of the procedure. The chin and submental area was prepped with chlorhexidine and sterile field was created. Skin was anesthetized using 1% lidocaine. Using ultrasound guidance, 18 gauge core biopsy needle was directed into the enlarged submental lymph node. Total of 5 core biopsies were obtained. Specimens were placed on a Telfa pad with saline. Bandage placed over the puncture site. FINDINGS: Abnormal rounded lymph node in the submental region near the midline. Core biopsy needle was confirmed within the lymph node. Adequate core specimens obtained. No immediate bleeding or hematoma formation. IMPRESSION: Ultrasound-guided core biopsy of submental lymph node. Electronically Signed   By: Markus Daft M.D.   On: 05/10/2022 15:24    Impression/Plan:    1) Head and Neck Cancer Status: ***  2) Nutritional Status: *** PEG tube:  ***  3) Risk Factors: The patient has been educated about risk factors including alcohol and tobacco abuse; they understand that avoidance of alcohol and tobacco is important to prevent recurrences as well as other cancers  4) Swallowing: ***  5) Dental: Encouraged to continue regular followup with dentistry, and dental hygiene including fluoride rinses. ***  6) Thyroid function:  Lab Results  Component Value Date   TSH 1.16 03/22/2016    7) Other: ***  8) Follow-up in *** months. The patient was encouraged to call with any issues or questions before then.  On date of service, in total, I spent *** minutes on this encounter. Patient was seen in person. _____________________________________   Eppie Gibson, MD

## 2022-05-18 ENCOUNTER — Encounter: Payer: Self-pay | Admitting: Radiation Oncology

## 2022-05-18 ENCOUNTER — Ambulatory Visit
Admission: RE | Admit: 2022-05-18 | Discharge: 2022-05-18 | Disposition: A | Discharge status: Home / Self Care | Payer: Medicare Other | Source: Ambulatory Visit | Attending: Radiation Oncology | Admitting: Radiation Oncology

## 2022-05-18 VITALS — BP 130/69 | HR 71 | Temp 97.6°F | Resp 20 | Ht 70.0 in | Wt 158.4 lb

## 2022-05-18 DIAGNOSIS — C001 Malignant neoplasm of external lower lip: Secondary | ICD-10-CM

## 2022-05-18 DIAGNOSIS — Z923 Personal history of irradiation: Secondary | ICD-10-CM | POA: Diagnosis not present

## 2022-05-18 DIAGNOSIS — Z79899 Other long term (current) drug therapy: Secondary | ICD-10-CM | POA: Diagnosis not present

## 2022-05-18 NOTE — Progress Notes (Signed)
Oncology Nurse Navigator Documentation   I met with Zachary Padilla during his follow up appointment with Dr. Isidore Moos. His upcoming PET scan was discussed and patient is aware that he will have the scan done on 05/27/22 at [place]. He is also aware that Dr. Fredric Dine with [facility] will call him to set up a consultation appointment to discuss possible surgery in the near future. He is agreeable. Zachary Padilla has my direct contact information and knows to call me if he has any further questions or concerns.   Harlow Asa RN, BSN, OCN Head & Neck Oncology Nurse [place] [facility] Phone # [phone]  Fax # [phone]    Established Patient Office Visit  Subjective   Patient ID: Zachary Padilla, male    DOB: August 12, 1928  Age: 89 y.o. MRN: [id number]  No chief complaint on file.   HPI    ROS    Objective:     There were no vitals taken for this visit.   Physical Exam   No results found for any visits on 05/18/22.    The ASCVD Risk score (Arnett DK, et al., 2019) failed to calculate for the following reasons:   The 2019 ASCVD risk score is only valid for ages 94 to 5    Assessment & Plan:   Problem List Items Addressed This Visit   None   No follow-ups on file.    Hanish Laraia, Stephani Police, RN

## 2022-05-19 ENCOUNTER — Telehealth: Payer: Self-pay | Admitting: Cardiovascular Disease

## 2022-05-19 NOTE — Telephone Encounter (Signed)
Patient is asking that you give him a call. Please advise

## 2022-05-20 ENCOUNTER — Encounter: Payer: Self-pay | Admitting: Radiation Oncology

## 2022-05-20 NOTE — Progress Notes (Signed)
Radiation Oncology         [phone] ________________________________  Name: Zachary Padilla MRN: [id number]  Date: 05/18/2022  DOB: 1928/06/28  Follow-Up Visit Note  Outpatient  CC: Zachary Manes, MD  Zachary Night, MD  Diagnosis and Prior Radiotherapy:    ICD-10-CM   1. 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, AJCC 8th Edition - Pathologic stage from 03/01/2022: Stage III (pT3, pN0, cM0) - Signed by Eppie Gibson, MD on 03/01/2022 Stage prefix: Initial diagnosis Extraosseous extension: Absent  Now with at least N1 nodal disease recurrence, staging pending surgery  ==========DELIVERED PLANS==========  First Treatment Date: 2022-03-14 - Last Treatment Date: 2022-04-12   Plan Name: [id number] Site: Lip, Lower Technique: Electron Mode: Electron Dose Per Fraction: 2.5 Gy Prescribed Dose (Delivered / Prescribed): 50 Gy / 50 Gy Prescribed Fxs (Delivered / Prescribed): 20 / 20    CHIEF COMPLAINT: Here for follow-up and surveillance of lip cancer  Narrative:  The patient returns today for routine follow-up.  Since completing right radiation therapy he underwent biopsy of the enlarged submental mass which was positive for squamous cell carcinoma. 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 PET is pending for full staging.        Mucositis and skin have healed nicely                         ALLERGIES:  is allergic to ace inhibitors, aminoglycosides, erythromycin, iodinated contrast media, levofloxacin, penicillins, sulfa antibiotics, and ciprofloxacin.  Meds: Current Outpatient Medications  Medication Sig Dispense Refill   cholecalciferol (VITAMIN D3) 25 MCG (1000  UNIT) tablet Take 1,000 Units by mouth daily.     furosemide (LASIX) 20 MG tablet Take 20 mg by mouth daily.     levETIRAcetam (KEPPRA) 500 MG tablet TAKE ONE-HALF TABLET IN THE MORNING AND TAKE ONE TABLET IN THE EVENING 135 tablet 3   SIMPLY SALINE NA Place 1 spray into the nose 2 (two) times daily.     No current facility-administered medications for this encounter.    Physical Findings: The patient is in no acute distress. Patient is alert and oriented.  height is '5\' 10"'$  (1.778 m) and weight is 158 lb 6.4 oz (71.8 kg). His temperature is 97.6 F (36.4 C). His blood pressure is 130/69 and his pulse is 71. His respiration is 20 and oxygen saturation is 99%. Marland Kitchen    HEENT: No residual mucositis present in his mouth.  Skin: Skin over his chin and lip have healed nicely. Neck: Palpable mass in the right submental region has evidence of interval growth since previous exam.     Lab Findings: Lab Results  Component Value Date   WBC 5.5 03/22/2016   HGB 14.3 03/22/2016   HCT 41.9 03/22/2016   MCV 96.5 03/22/2016   PLT 160 03/22/2016    Radiographic Findings: ECHOCARDIOGRAM COMPLETE  Result Date: 05/12/2022    ECHOCARDIOGRAM REPORT   Patient Name:   Zachary Padilla  Date of Exam: 05/11/2022 Medical Rec #:  [id number]     Height:       70.0 in Accession #:    [id number]    Weight:  154.0 lb Date of Birth:  December 17, 1928     BSA:          1.868 m Patient Age:    89 years      BP:           157/88 mmHg Patient Gender: M             HR:           61 bpm. Exam Location:  [facility] Procedure: 2D Echo, Cardiac Doppler, Color Doppler and Strain Analysis Indications:    I05.9 Mitral Valve disorder  History:        Patient has prior history of Echocardiogram examinations, most                 recent 06/09/2021. Arrythmias:RBBB; Risk Factors:Hypertension, HLD                 and Sleep Apnea.  Sonographer:    Zachary Padilla RCS Referring Phys: [place]  1. Left ventricular ejection  fraction, by estimation, is 60 to 65%. The left ventricle has normal function. The left ventricle has no regional wall motion abnormalities. Left ventricular diastolic parameters are consistent with Grade II diastolic dysfunction (pseudonormalization). The average left ventricular global longitudinal strain is -19.9 %. The global longitudinal strain is normal.  2. Right ventricular systolic function is normal. The right ventricular size is normal. There is normal pulmonary artery systolic pressure.  3. Left atrial size was mildly dilated.  4. The mitral valve is normal in structure. Moderate mitral valve regurgitation. No evidence of mitral stenosis.  5. Tricuspid valve regurgitation is mild to moderate.  6. The aortic valve is tricuspid. Aortic valve regurgitation is mild.  7. Aortic dilatation noted. There is mild dilatation of the ascending aorta, measuring 43 mm.  8. The inferior vena cava is normal in size with greater than 50% respiratory variability, suggesting right atrial pressure of 3 mmHg. Comparison(s): No significant change from prior study. Ascending aorta 43 mm (prior 42), MR unchanged. FINDINGS  Left Ventricle: Left ventricular ejection fraction, by estimation, is 60 to 65%. The left ventricle has normal function. The left ventricle has no regional wall motion abnormalities. The average left ventricular global longitudinal strain is -19.9 %. The global longitudinal strain is normal. The left ventricular internal cavity size was normal in size. There is no left ventricular hypertrophy. Left ventricular diastolic parameters are consistent with Grade II diastolic dysfunction (pseudonormalization). Right Ventricle: The right ventricular size is normal. No increase in right ventricular wall thickness. Right ventricular systolic function is normal. There is normal pulmonary artery systolic pressure. The tricuspid regurgitant velocity is 2.87 m/s, and  with an assumed right atrial pressure of 3 mmHg, the  estimated right ventricular systolic pressure is 97.9 mmHg. Left Atrium: Left atrial size was mildly dilated. Right Atrium: Right atrial size was normal in size. Pericardium: There is no evidence of pericardial effusion. Mitral Valve: PISA: MR radius 0.7 cm, ERO 0.21 cm2, volume 39 ml. The mitral valve is normal in structure. Moderate mitral valve regurgitation. No evidence of mitral valve stenosis. Tricuspid Valve: The tricuspid valve is normal in structure. Tricuspid valve regurgitation is mild to moderate. No evidence of tricuspid stenosis. Aortic Valve: The aortic valve is tricuspid. Aortic valve regurgitation is mild. Aortic regurgitation PHT measures 748 msec. Pulmonic Valve: The pulmonic valve was grossly normal. Pulmonic valve regurgitation is mild. No evidence of pulmonic stenosis. Aorta: Aortic dilatation noted. There is mild dilatation of  the ascending aorta, measuring 43 mm. Venous: The inferior vena cava is normal in size with greater than 50% respiratory variability, suggesting right atrial pressure of 3 mmHg. IAS/Shunts: The atrial septum is grossly normal.  LEFT VENTRICLE PLAX 2D LVIDd:         4.50 cm   Diastology LVIDs:         2.40 cm   LV e' medial:    5.33 cm/s LV PW:         0.90 cm   LV E/e' medial:  13.3 LV IVS:        0.90 cm   LV e' lateral:   4.03 cm/s LVOT diam:     1.80 cm   LV E/e' lateral: 17.5 LV SV:         54 LV SV Index:   29        2D Longitudinal Strain LVOT Area:     2.54 cm  2D Strain GLS (A2C):   -18.2 %                          2D Strain GLS (A3C):   -22.9 %                          2D Strain GLS (A4C):   -18.5 %                          2D Strain GLS Avg:     -19.9 % RIGHT VENTRICLE RV Basal diam:  3.60 cm RV S prime:     12.70 cm/s TAPSE (M-mode): 2.4 cm RVSP:           35.9 mmHg LEFT ATRIUM             Index        RIGHT ATRIUM           Index LA diam:        3.30 cm 1.77 cm/m   RA Pressure: 3.00 mmHg LA Vol (A2C):   62.8 ml 33.62 ml/m  RA Area:     18.30 cm LA Vol  (A4C):   47.2 ml 25.27 ml/m  RA Volume:   42.30 ml  22.65 ml/m LA Biplane Vol: 58.9 ml 31.53 ml/m  AORTIC VALVE LVOT Vmax:   93.80 cm/s LVOT Vmean:  62.900 cm/s LVOT VTI:    0.212 m AI PHT:      748 msec  AORTA Ao Root diam: 4.10 cm Ao Asc diam:  4.30 cm MITRAL VALVE                  TRICUSPID VALVE MV Area (PHT):                TR Peak grad:   32.9 mmHg MV Decel Time:                TR Vmax:        287.00 cm/s MR Peak grad:    85.0 mmHg    Estimated RAP:  3.00 mmHg MR Mean grad:    60.0 mmHg    RVSP:           35.9 mmHg MR Vmax:         461.00 cm/s MR Vmean:        366.0 cm/s   SHUNTS MR PISA:  3.08 cm     Systemic VTI:  0.21 m MR PISA Eff ROA: 21 mm       Systemic Diam: 1.80 cm MR PISA Radius:  0.70 cm MV E velocity: 70.70 cm/s MV A velocity: 80.70 cm/s MV E/A ratio:  0.88 Buford Dresser MD Electronically signed by Buford Dresser MD Signature Date/Time: 05/12/2022/7:09:19 AM    Final    [place] CORE BIOPSY (LYMPH NODES)  Result Date: 05/10/2022 INDICATION: 89 year old with history of squamous cell carcinoma and an enlarged submental lymph node. EXAM: ULTRASOUND-GUIDED BIOPSY OF SUBMENTAL LYMPH NODE MEDICATIONS: 1% lidocaine for local anesthetic ANESTHESIA/SEDATION: None FLUOROSCOPY TIME:  None COMPLICATIONS: None immediate. PROCEDURE: Informed written consent was obtained from the patient after a thorough discussion of the procedural risks, benefits and alternatives. All questions were addressed. A timeout was performed prior to the initiation of the procedure. The chin and submental area was prepped with chlorhexidine and sterile field was created. Skin was anesthetized using 1% lidocaine. Using ultrasound guidance, 18 gauge core biopsy needle was directed into the enlarged submental lymph node. Total of 5 core biopsies were obtained. Specimens were placed on a Telfa pad with saline. Bandage placed over the puncture site. FINDINGS: Abnormal rounded lymph node in the submental region near the  midline. Core biopsy needle was confirmed within the lymph node. Adequate core specimens obtained. No immediate bleeding or hematoma formation. IMPRESSION: Ultrasound-guided core biopsy of submental lymph node. Electronically Signed   By: Markus Daft M.D.   On: 05/10/2022 15:24    Impression/Plan: I advised the patient to take the next available appointment with otolaryngology given that it will take some time to schedule him for surgery to cure his nodal recurrence of his skin cancer.  He will have the best chance of cure if his surgery is performed in the near future.  He understands that surgery will not be conducted until PET scan results are available to rule out distant metastatic disease.  He is now agreeable to seeing otolaryngology at their next available consultation.  I will see him back on an as-needed basis depending on the timing of the surgery and the results from that surgery.  He understands that he may need some postoperative radiation depending on the extent of disease found at the time of surgery.  I also encouraged him to connect me with family members if they have questions about his care.  He was very appreciative of our meeting today.  On date of service, in total, I spent 25 minutes on this encounter. Patient was seen in person.  _____________________________________   Eppie Gibson, MD

## 2022-05-20 NOTE — Telephone Encounter (Signed)
Spoke with pt. Pt asked if it was ok from a cardiac standpoint to proceed with his procedure for skin cancer. Pt is aware that it's ok from a cardiac standpoint to proceed with his procedure ok per Diona Browner, NP.

## 2022-05-27 ENCOUNTER — Ambulatory Visit (HOSPITAL_COMMUNITY)
Admission: RE | Admit: 2022-05-27 | Discharge: 2022-05-27 | Disposition: A | Discharge status: Home / Self Care | Payer: Medicare Other | Source: Ambulatory Visit | Attending: Radiation Oncology | Admitting: Radiation Oncology

## 2022-05-27 DIAGNOSIS — C001 Malignant neoplasm of external lower lip: Secondary | ICD-10-CM | POA: Insufficient documentation

## 2022-05-27 LAB — GLUCOSE, CAPILLARY: Glucose-Capillary: 94 mg/dL (ref 70–99)

## 2022-05-27 MED ORDER — FLUDEOXYGLUCOSE F - 18 (FDG) INJECTION
7.8000 | Freq: Once | INTRAVENOUS | Status: AC
  Administered 2022-05-27: 7.93 via INTRAVENOUS

## 2022-06-06 ENCOUNTER — Telehealth: Payer: Self-pay | Admitting: Cardiovascular Disease

## 2022-06-06 NOTE — Telephone Encounter (Signed)
I do not see any clearance request entered in the chart. I will reach out to Dr. Kennon Holter CMA Derrick Ravel and to Diona Browner, NP to see if they be able to shed a little light on the subject.   I also reviewed notes from Diona Browner, NP 05/16/22. I do not see anywhere she has cleared the pt. Notes from [place] 9/47/12: Derrick Ravel, [place]      10/03/10  9:55 AM Note Spoke with pt. Pt asked if it was ok from a cardiac standpoint to proceed with his procedure for skin cancer. Pt is aware that it's ok from a cardiac standpoint to proceed with his procedure ok per Diona Browner, NP.     May 19, 2022  Lenna Sciara, NP  to Derrick Ravel, [facility]     02/05/08  6:48 PM Do you mind checking in with him to see what is going on? Thank you. -EM

## 2022-06-06 NOTE — Telephone Encounter (Signed)
Patient is calling to f/u on status of being cleared for upcoming procedure. Patient spoke with CMA 01/12 regarding this, but has not heard back. Based on 01/25 telephone note in care everywhere clearance request was faxed to the office on 01/25. Patient also scheduled an appt with Dr. Gwenlyn Found for 01/31 per his request due to wanting to speak with his MD before having surgery scheduled.

## 2022-06-06 NOTE — Telephone Encounter (Signed)
Called patient to get information for what procedure and doctor's office to have the information in his chart for review. Patient gave name for Dr. Fredric Dine and Dr. Veronda Prude. He stated that he would like for someone to give him a call back once we get the needed information prior to his appointment with Dr. Gwenlyn Found on Wednesday 06/08/22. He thanked me for call and he was informed that the 2 office will be called in the morning to get needed information.

## 2022-06-07 ENCOUNTER — Telehealth: Payer: Self-pay

## 2022-06-07 NOTE — Telephone Encounter (Signed)
   Pre-operative Risk Assessment    Patient Name: Zachary Padilla  DOB: 1928/06/30 MRN: [id number]      Request for Surgical Clearance    Procedure:   Bilateral supraomohyoid neck dissection   Date of Surgery:  Clearance TBD                                 Surgeon:  Dr. Ebbie Latus Surgeon's Group or Practice Name:  [facility]  Phone number:  [phone] Fax number:  [phone] Attn: Zadie Cleverly   Type of Clearance Requested:   - Medical    Type of Anesthesia:  Not Indicated   Additional requests/questions:    SignedJacqulynn Cadet   06/07/2022, 2:14 PM

## 2022-06-07 NOTE — Telephone Encounter (Signed)
Patient has an appointment with Dr. Gwenlyn Found on 06/08/2022 at which time request for cardiac clearance will be addressed.  I am removing this request from the preop pool.  Emmaline Life, NP-C  06/07/2022, 2:31 PM [address], [address] Office [phone] Fax [phone]

## 2022-06-07 NOTE — Telephone Encounter (Signed)
Spoke with pt. Pt is aware that he is to discuss clearance needed at his appointment tomorrow 06/08/22 with Dr. Gwenlyn Found, as Diona Browner NP was only aware of the PET scan when pt saw seen in our office. Pt voiced understanding and will discuss further with Dr. Gwenlyn Found.

## 2022-06-08 ENCOUNTER — Ambulatory Visit: Payer: Medicare Other | Admitting: Podiatry

## 2022-06-08 ENCOUNTER — Ambulatory Visit: Payer: Medicare Other | Attending: Cardiovascular Disease | Admitting: Cardiovascular Disease

## 2022-06-08 ENCOUNTER — Telehealth: Payer: Self-pay

## 2022-06-08 ENCOUNTER — Encounter: Payer: Self-pay | Admitting: Cardiovascular Disease

## 2022-06-08 VITALS — BP 126/68 | HR 60 | Ht 70.0 in | Wt 157.2 lb

## 2022-06-08 DIAGNOSIS — I340 Nonrheumatic mitral (valve) insufficiency: Secondary | ICD-10-CM

## 2022-06-08 DIAGNOSIS — E782 Mixed hyperlipidemia: Secondary | ICD-10-CM | POA: Diagnosis not present

## 2022-06-08 DIAGNOSIS — I10 Essential (primary) hypertension: Secondary | ICD-10-CM

## 2022-06-08 DIAGNOSIS — I7121 Aneurysm of the ascending aorta, without rupture: Secondary | ICD-10-CM | POA: Diagnosis not present

## 2022-06-08 DIAGNOSIS — Z01818 Encounter for other preprocedural examination: Secondary | ICD-10-CM | POA: Insufficient documentation

## 2022-06-08 NOTE — Telephone Encounter (Signed)
Pt daughter called RN concerned about her dad and the risk of surgery that he is to have soon. She is concerned based on his age overall. Rn will reach out to Dr. Isaias Cowman to address this concern.

## 2022-06-08 NOTE — Assessment & Plan Note (Signed)
History of small thoracic aortic aneurysm last checked 05/11/2022 measuring 43 mm by 2D echo.  Given his age and relative small nature of the dilatation I do not feel this needs to be further evaluated.

## 2022-06-08 NOTE — Progress Notes (Signed)
06/08/2022 Zachary Padilla   July 10, 1928  [id number]  Primary Physician Zachary Frames, MD Primary Cardiologist: Zachary Harp MD FACP, [place], [place], [place]  HPI:  Zachary Padilla is a 89 y.o.   thin and fit appearing married Caucasian male father of 2, grandfather 4 grandchildren who I saw last saw in the office 11/16/2021.Marland Kitchen  He is accompanied by his wife Zachary Padilla today who is also a patient of mine.  He has a history of hypertension, mild hyperlipidemia and family history of heart disease as well as chronic right bundle branch block. His mother died of an MI at age 4. A Myoview stress test performed 01/19/12 was entirely normal. He denies chest pain or shortness of breath. A chest CT angiogram was performed 03/22/12 for evaluation of potential lung cancer did incidentally reveal a 4.3 cm descending thoracic aortic aneurysm which he has been asymptomatic fromthis was rechecked April of this year and was unchanged. He denies chest pain or shortness of breath. His most recent CT scan performed 11/18/15 revealed his ascending aneurysm measuring 4.3  cm at its maximum dimension, unchanged from prior studies.     Since I saw him 6 months ago he remained stable.   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  This apparently is a fairly long operation.  He is otherwise asymptomatic.  He did have a 2D echocardiogram performed 05/11/2022 revealing normal LV systolic function with moderate MR and a ascending thoracic aorta measuring 43 mm.   Current Meds  Medication Sig   Azelaic Acid (FINACEA) 15 % gel Apply topically.   cholecalciferol (VITAMIN D3) 25 MCG (1000 UNIT) tablet Take 1,000 Units by mouth daily.   furosemide (LASIX) 20 MG tablet Take 20 mg by mouth daily.   levETIRAcetam (KEPPRA) 500 MG tablet TAKE ONE-HALF TABLET IN THE MORNING AND TAKE ONE TABLET IN THE EVENING    SIMPLY SALINE NA Place 1 spray into the nose 2 (two) times daily.     Allergies  Allergen Reactions   Ace Inhibitors Other (See Comments)    unknown   Aminoglycosides     Unknown reaction   Erythromycin Nausea And Vomiting   Iodinated Contrast Media     Cannot Have do to decreased kidney function.    Levofloxacin Other (See Comments)    dehydration   Penicillins Nausea And Vomiting   Sulfa Antibiotics     Unknown reaction    Ciprofloxacin Hives and Rash    Social History   Socioeconomic History   Marital status: Married    Spouse name: Not on file   Number of children: 2   Years of education: 15   Highest education level: Not on file  Occupational History   Occupation: retired  Tobacco Use   Smoking status: Former   Smokeless tobacco: Never   Tobacco comments:    Lives with spouse-independant in all ADLs. [employer] and [employer] at [employer], Married in "52"  Vaping Use   Vaping Use: Never used  Substance and Sexual Activity   Alcohol use: No   Drug use: No   Sexual activity: Yes  Other Topics Concern   Not on file  Social History Narrative   Patient occasionally drinks tea.   Patient is right handed.   Social Determinants of Health   Financial Resource Strain: Not on file  Food Insecurity: Not on file  Transportation Needs: Not on file  Physical Activity: Not on file  Stress: Not on file  Social Connections: Not on file  Intimate Partner Violence: Not on file     Review of Systems: General: negative for chills, fever, night sweats or weight changes.  Cardiovascular: negative for chest pain, dyspnea on exertion, edema, orthopnea, palpitations, paroxysmal nocturnal dyspnea or shortness of breath Dermatological: negative for rash Respiratory: negative for cough or wheezing Urologic: negative for hematuria Abdominal: negative for nausea, vomiting, diarrhea, bright red blood per rectum, melena, or hematemesis Neurologic: negative for  visual changes, syncope, or dizziness All other systems reviewed and are otherwise negative except as noted above.    Blood pressure 126/68, pulse 60, height '5\' 10"'$  (1.778 m), weight 157 lb 3.2 oz (71.3 kg), SpO2 97 %.  General appearance: alert and no distress Neck: no adenopathy, no carotid bruit, no JVD, supple, symmetrical, trachea midline, and thyroid not enlarged, symmetric, no tenderness/mass/nodules Lungs: clear to auscultation bilaterally Heart: regular rate and rhythm, S1, S2 normal, no murmur, click, rub or gallop Extremities: extremities normal, atraumatic, no cyanosis or edema Pulses: 2+ and symmetric Skin: Skin color, texture, turgor normal. No rashes or lesions Neurologic: Grossly normal  EKG sinus rhythm at 60 with right bundle branch block.  Personally reviewed this EKG.  ASSESSMENT AND PLAN:   Essential hypertension History of essential hypertension a blood pressure measured today at 126/68.  He is not on antihypertensive medications.  Hyperlipidemia History of hyperlipidemia not on statin therapy followed by his PCP  Moderate mitral regurgitation History of moderate mitral regurgitation by 2D echocardiogram was recently performed 1/3//24 with normal LV function.  Given his age and lack of symptoms I do not think the need to be followed up on.  Thoracic aortic aneurysm (HCC) History of small thoracic aortic aneurysm last checked 05/11/2022 measuring 43 mm by 2D echo.  Given his age and relative small nature of the dilatation I do not feel this needs to be further evaluated.  Preoperative clearance Mr. 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  He had no history of heart disease.  He does have normal LV function with moderate MR.  While this is a long operation is relatively localized and I think he is cleared with low cardiovascular risk.     Zachary Harp MD FACP,FACC,FAHA,  [facility] 06/08/2022 2:33 PM

## 2022-06-08 NOTE — Assessment & Plan Note (Signed)
History of essential hypertension a blood pressure measured today at 126/68.  He is not on antihypertensive medications.

## 2022-06-08 NOTE — Assessment & Plan Note (Signed)
Mr. 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  He had no history of heart disease.  He does have normal LV function with moderate MR.  While this is a long operation is relatively localized and I think he is cleared with low cardiovascular risk.

## 2022-06-08 NOTE — Assessment & Plan Note (Signed)
History of moderate mitral regurgitation by 2D echocardiogram was recently performed 1/3//24 with normal LV function.  Given his age and lack of symptoms I do not think the need to be followed up on.

## 2022-06-08 NOTE — Assessment & Plan Note (Signed)
History of hyperlipidemia not on statin therapy followed by his PCP

## 2022-06-08 NOTE — Patient Instructions (Signed)

## 2022-06-10 ENCOUNTER — Other Ambulatory Visit: Payer: Self-pay | Admitting: Otolaryngology

## 2022-06-10 ENCOUNTER — Telehealth: Payer: Self-pay

## 2022-06-10 NOTE — Telephone Encounter (Signed)
Pt daughter called RN Anderson Malta about her father and concern for his surgery. She stated the pt was still very anxious about surgery overall and was concerned with it coming up so quickly. She stated he wanted to know all of his options. Rn did tell pt Dr. Isidore Moos did not feel like radiation therapy alone would cure this pt and would still recommend surgery if a candidate. Rn also echoed information that delaying surgery is not advisable as well. Pt daughter also asked if nothing was done what the prognosis would be. Rn advised that the pt would really need to speak to Dr. Isaias Cowman about this specific information. Pt daughter feels like hearing from Dr. Isidore Moos would help her dad cope with what is going on and would like Dr. Isidore Moos to call him on Monday. Overall pt daughter was emotional and understood details of conversation. Rn gave emotional support to pt daughter and encouraged her to reach out with any other concerns or questions.

## 2022-06-14 ENCOUNTER — Encounter: Payer: Self-pay | Admitting: Radiation Oncology

## 2022-06-14 ENCOUNTER — Telehealth: Payer: Self-pay

## 2022-06-14 NOTE — Telephone Encounter (Signed)
Pt called wanting to speak to Dr. Isidore Moos concerning upcoming recovery concerns and overall prognosis. His daughter called as well after missing Dr. Pearlie Oyster calls twice. Rn will pass the message to Dr. Isidore Moos.

## 2022-06-14 NOTE — Progress Notes (Signed)
I spoke with the patient's daughter, Ms. Dorina Hoyer, this morning.  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  He is already having pain from this lymph node.  It is continuing to grow.  We talked about the fact that doing nothing would affect his quality of life and his longevity.  Surgery - while not without risks -  is an important part of his treatment to give him the best chance of cure. We also discussed the fact that postoperative radiation may be necessary to give him the best chance of cure for his aggressive disease.  She understands how much I care about her father and how much I want the best for him.  The whole team is doing their best to find a balanced approach for his aggressive disease while considering his age and other medical issues.  She expressed deep appreciation for call and understands that surgery would be more helpful if they pursue it this week as scheduled, rather than with delays. -----------------------------------  Eppie Gibson, MD

## 2022-06-17 ENCOUNTER — Inpatient Hospital Stay (HOSPITAL_COMMUNITY): Admission: RE | Admit: 2022-06-17 | Payer: Medicare Other | Source: Home / Self Care | Admitting: Otolaryngology

## 2022-06-17 ENCOUNTER — Encounter (HOSPITAL_COMMUNITY): Admission: RE | Payer: Self-pay | Source: Home / Self Care

## 2022-06-17 SURGERY — DISSECTION, NECK, RADICAL
Anesthesia: General | Laterality: Bilateral

## 2022-06-17 NOTE — Progress Notes (Signed)
Oncology Nurse Navigator Documentation   I spoke to [name] and his daughter separately on the phone. I discussed his decision to not proceed with surgery with Dr. Fredric Dine. [name] explained that he was very concerned about the recovery from surgery and felt in his heart that he did not want to proceed as recommended. I told him that Dr. Isidore Moos has offered radiation to help with symptoms and would like to discuss it with him at a follow up appointment and possible CT simulation. He has agreed to see Dr. Isidore Moos and discuss future radiation. I have sent a message to scheduling to get him scheduled for a follow up appointment with Dr. Isidore Moos with a CT simulation afterwards.   Zachary Asa RN, BSN, OCN Head & Neck Oncology Nurse [place] at [facility] Phone # [phone]  Fax # [phone]

## 2022-06-20 ENCOUNTER — Ambulatory Visit: Payer: Medicare Other | Admitting: Radiation Oncology

## 2022-06-20 ENCOUNTER — Ambulatory Visit: Admission: RE | Admit: 2022-06-20 | Payer: Medicare Other | Source: Ambulatory Visit | Admitting: Radiation Oncology

## 2022-06-20 ENCOUNTER — Ambulatory Visit
Admission: RE | Admit: 2022-06-20 | Discharge: 2022-06-20 | Disposition: A | Discharge status: Home / Self Care | Payer: Medicare Other | Source: Ambulatory Visit | Attending: Radiation Oncology | Admitting: Radiation Oncology

## 2022-06-20 ENCOUNTER — Ambulatory Visit: Payer: Medicare Other

## 2022-06-20 ENCOUNTER — Encounter: Payer: Self-pay | Admitting: Radiation Oncology

## 2022-06-20 VITALS — BP 147/70 | HR 60 | Temp 97.4°F | Resp 20 | Ht 70.0 in | Wt 159.8 lb

## 2022-06-20 DIAGNOSIS — C001 Malignant neoplasm of external lower lip: Secondary | ICD-10-CM | POA: Insufficient documentation

## 2022-06-20 DIAGNOSIS — C770 Secondary and unspecified malignant neoplasm of lymph nodes of head, face and neck: Secondary | ICD-10-CM | POA: Diagnosis present

## 2022-06-20 NOTE — Progress Notes (Signed)
Radiation Oncology         [phone] ________________________________  Name: Zachary Padilla MRN: [id number]  Date: 06/20/2022  DOB: 1928/11/30  Follow-Up Visit Note  Outpatient  CC: Zachary Frames, MD  Skotnicki, Meghan A, DO  Diagnosis and Prior Radiotherapy: C77.0   ICD-10-CM   1. 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  Zachary Padilla is here today for follow up post radiation - completed treatment on 04/12/22 to his lip.  He is also here to discuss recurrence in his neck node  Since he completed treatment we established that he did in fact have metastatic adenopathy in the submental region.  I tried to expedite consultation with ENT for surgical resection as quickly as possible to give him Padilla chance of cure.  The patient had last-minute reservations about the potential risks of surgery and elected to cancel his operation.  He was amenable to coming back in to see me today to talk about radiation therapy as an alternative for his metastatic skin cancer to his submental node.  Since I last saw him the node has grown aggressively and is now eroding through the skin of the submental region and draining  serosanguineous fluid throughout the day and night.  This is quite uncomfortable for him.  The patient is accompanied by his wife.  We were able to incorporate his daughter, Zachary Padilla, by speaker phone as well.   In review he did have Padilla staging PET scan which ruled out distant metastatic disease but confirmed SUV uptake in the submental mass  Pain issues, if any: No Using Padilla feeding tube?: No Weight changes, if any: No Swallowing issues, if any: No Smoking or chewing tobacco? No Using fluoride trays daily? No Last ENT visit was on: About Padilla year ago Other notable issues, if any:  Vitals:   06/20/22 0855  BP: (!) 147/70  Pulse: 60  Resp: 20  Temp: (!) 97.4 F (36.3 C)  SpO2: 99%  Weight: 159 lb 12.8 oz (72.5 kg)  Height: 5' 10"$  (1.778 m)                                  ALLERGIES:  is allergic to ace inhibitors, aminoglycosides, erythromycin, iodinated contrast media, levofloxacin, penicillins, sulfa antibiotics, and ciprofloxacin.  Meds: Current Outpatient Medications  Medication Sig Dispense Refill   cholecalciferol (VITAMIN D3) 25 MCG (1000 UNIT) tablet Take 1,000 Units by mouth daily.     furosemide (LASIX) 20 MG tablet Take 20 mg by mouth daily.     levETIRAcetam (KEPPRA) 500 MG tablet TAKE ONE-HALF TABLET IN THE MORNING AND TAKE ONE TABLET IN THE EVENING 135  tablet 3   SIMPLY SALINE NA Place 1 spray into the nose 2 (two) times daily.     No current facility-administered medications for this encounter.    Physical Findings: The patient is in no acute distress. Patient is alert and oriented.  height is 5' 10"$  (1.778 m) and weight is 159 lb 12.8 oz (72.5 kg). His temperature is 97.4 F (36.3 C) (abnormal). His blood pressure is 147/70 (abnormal) and his pulse is 60. His respiration is 20 and oxygen saturation is 99%. .    General: Alert and oriented, in no acute distress HEENT: Head is normocephalic. Extraocular movements are intact.  Mild swelling of the lower lip.  No mucosal  abnormality appreciated in the anterior mouth.   3 cm mass in submental region with erosion through the skin and drainage of serosanguineous fluid Neck: As above, note submental mass Lymphatics: 3 cm submental mass Skin: As above, note erosion of mass through skin and submental region with drainage of serosanguineous fluid Musculoskeletal: symmetric strength and muscle tone throughout.  Ambulatory Neurologic: Cranial nerves II through XII are grossly intact. No obvious focalities. Speech is fluent. Coordination is intact. Psychiatric:   Affect is appropriate  ECOG = 1  0 - Asymptomatic (Fully active, able to carry on all predisease activities without restriction)  1 - Symptomatic but completely ambulatory (Restricted in physically strenuous activity but ambulatory and able to carry out work of Padilla light or sedentary nature. For example, light housework, office work)  2 - Symptomatic, <50% in bed during the day (Ambulatory and capable of all self care but unable to carry out any work activities. Up and about more than 50% of waking hours)  3 - Symptomatic, >50% in bed, but not bedbound (Capable of only limited self-care, confined to bed or chair 50% or more of waking hours)  4 - Bedbound (Completely disabled. Cannot carry on any self-care. Totally confined to bed or chair)  5 - Death   Zachary Padilla MM, Zachary Padilla, Zachary Padilla, et al. [phone]). "Toxicity and response criteria of the [facility]". [place] Oncol. 5 (6): 649-55   Lab Findings: Lab Results  Component Value Date   WBC 5.5 03/22/2016   HGB 14.3 03/22/2016   HCT 41.9 03/22/2016   MCV 96.5 03/22/2016   PLT 160 03/22/2016    Radiographic Findings: NM PET Image Initial (PI) Skull Base To Thigh  Result Date: 05/28/2022 CLINICAL DATA:  Initial treatment strategy for head and neck cancer. EXAM: NUCLEAR MEDICINE PET SKULL BASE TO THIGH TECHNIQUE: 7.93 mCi F-18 FDG was injected intravenously. Full-ring PET imaging  was performed from the skull base to thigh after the radiotracer. CT data was obtained and used for attenuation correction and anatomic localization. Fasting blood glucose: 94 mg/dl COMPARISON:  CT neck 04/08/2022 FINDINGS: Mediastinal blood pool activity: SUV max 2.99 Liver activity: SUV max NA NECK: Soft tissue thickening along the lower lip is again noted. There is mild diffuse increased uptake within this area with SUV max of 3.033, image 34/4. Right level 1A lymph node measures 1.9 cm and has an SUV max of 15.72, image 43/4. This is increased from 0.9 cm on 04/08/2022. Incidental CT findings: Diffuse increased uptake localizing to the nose is of uncertain significance with SUV max of 5.32. Mucosal thickening noted within bilateral maxillary sinuses with postoperative changes from prior right inferior ethmoidectomy and uncinectomy. Bilateral median antrectomy. Air-fluid level is again seen within the sphenoid sinus. CHEST: No hypermetabolic mediastinal or  hilar nodes. No suspicious pulmonary nodules on the CT scan. Incidental CT findings: Scarring noted within the lung bases. Mild cardiac enlargement. Aortic atherosclerosis and coronary artery calcifications. ABDOMEN/PELVIS: No abnormal hypermetabolic activity within the liver, pancreas, adrenal glands, or spleen. No hypermetabolic lymph nodes in the abdomen or pelvis. Incidental CT findings: Aortic atherosclerosis. Moderate size hiatal hernia. Prostate gland enlargement. Bladder wall trabeculation with small intramural diverticula noted suggest chronic bladder outlet obstruction. Colonic diverticulosis without acute diverticulitis. There is mild wall thickening involving the proximal stomach at the level of the GE junction with increased tracer uptake with SUV max of 5.41. SKELETON: No focal hypermetabolic activity to suggest skeletal metastasis. Incidental CT findings: None. IMPRESSION: 1. Enlarged and hypermetabolic right level 1A lymph node compatible with  metastatic adenopathy. 2. There is mild, nonspecific diffuse increased uptake corresponding to the lower lip, which may reflect post treatment inflammation. 3. No signs of tracer avid metastatic disease to the chest, abdomen or pelvis. 4. Diffuse increased tracer uptake localizing to the nose is of uncertain significance. Correlate for any clinical signs or symptoms of inflammation or infection. 5. Moderate hiatal hernia with mild wall thickening involving the proximal stomach at the level of the GE junction with increased tracer uptake, SUV max 5.41. Correlate for any clinical signs or symptoms of gastritis. 6. Chronic sinus disease. 7. Prostate gland enlargement with evidence of chronic bladder outlet obstruction. 8.  Aortic Atherosclerosis (ICD10-I70.0). Electronically Signed   By: Kerby Moors M.D.   On: 05/28/2022 08:46    Impression/Plan: Recurrent head and neck cancer, primary in the lip now with submental node clinically demonstrating extracapsular extension and skin erosion  He has elected to cancel surgical resection due to concerns about risk of surgical side effects and complications and anesthesia risks as well  I had Padilla very lengthy discussion with the patient and his wife and incorporated their daughter, Zachary Padilla, into the discussion by speaker phone today.  We talked about the seriousness of his disease and the likelihood that this would significantly affect his quality of life with potential infection, pain, interruption of eating, interruption of speech, if we do not treat his cancer recurrence.  The best alternative at this time to surgery is radiation therapy to the submental mass as soon as possible with elective coverage of the highest risk lymph nodes in the bilateral neck.  We discussed that he would likely tolerate 4 weeks of hypofractionated radiation but if he is reluctant to undergo that we could pursue shorter course  of radiation that would be easier in the acute phase and have less  acute side effects.  That said, reducing the number of weeks of radiation therapy may compromise his chance of cure.  Treatment would be given with curative intent with the understanding that there are no guarantees of treatment.  The patient had many questions that I answered to the best of my ability.  We had him scheduled for treatment planning session this morning but he did not feel comfortable going through that and asked to have 1 more day to think about matters on his own.  We moved his treatment planning simulation to tomorrow.  He did sign Padilla consent form acknowledging the risks benefits and side effects of radiation therapy, acknowledging that I will do my very best to mold the radiation away from his previous fields to reduce the risk of tissue injury.  We talked about potential side effects which may include but not necessarily be limited to fatigue, mucositis,  taste changes, skin irritation, local hair loss, injury to bone and local soft tissues, and swelling in the soft tissues.  I told him I am confident that radiation therapy is in his best interest and that the potential benefits of radiation outweigh the potential risks of radiation.  I do not recommend that he lock in in the potential morbidity of cancer progression by doing nothing.  He is going to think about things and I am hoping that he will come back tomorrow for treatment planning as recommended.  He he understands that at minimum I would like for him to get at least 5 treatments to have Padilla chance of palliation and improvement in quality of life, tumor drainage, etc with possibility of long-term tumor control.  I expressed how much I care about him and how concerned I am for his health.  I do think there is hope if he can come in for treatment. He appreciated our discussion today and expressed deep gratitude for the care he has received  On date of service, in total, I spent 60 minutes on this encounter. Patient was seen in person.   _____________________________________   Zachary Gibson, MD

## 2022-06-20 NOTE — Progress Notes (Signed)
Zachary Padilla is here today for follow up post radiation to Head and neck nodes. Patient completed treatment on 04/12/22.    Pain issues, if any: No Using a feeding tube?: No Weight changes, if any: No Swallowing issues, if any: No Smoking or chewing tobacco? No Using fluoride trays daily? No Last ENT visit was on: About a yeay ago Other notable issues, if any:  Vitals:   06/20/22 0855  BP: (!) 147/70  Pulse: 60  Resp: 20  Temp: (!) 97.4 F (36.3 C)  SpO2: 99%  Weight: 72.5 kg  Height: 5' 10"$  (1.778 m)

## 2022-06-21 ENCOUNTER — Ambulatory Visit
Admission: RE | Admit: 2022-06-21 | Discharge: 2022-06-21 | Disposition: A | Discharge status: Home / Self Care | Payer: Medicare Other | Source: Ambulatory Visit | Attending: Radiation Oncology | Admitting: Radiation Oncology

## 2022-06-21 DIAGNOSIS — C001 Malignant neoplasm of external lower lip: Secondary | ICD-10-CM | POA: Diagnosis not present

## 2022-06-24 DIAGNOSIS — C001 Malignant neoplasm of external lower lip: Secondary | ICD-10-CM | POA: Diagnosis not present

## 2022-06-27 ENCOUNTER — Ambulatory Visit
Admission: RE | Admit: 2022-06-27 | Discharge: 2022-06-27 | Disposition: A | Discharge status: Home / Self Care | Payer: Medicare Other | Source: Ambulatory Visit | Attending: Radiation Oncology | Admitting: Radiation Oncology

## 2022-06-27 ENCOUNTER — Encounter: Payer: Self-pay | Admitting: General Practice

## 2022-06-27 ENCOUNTER — Other Ambulatory Visit: Payer: Self-pay

## 2022-06-27 ENCOUNTER — Ambulatory Visit: Payer: Medicare Other

## 2022-06-27 DIAGNOSIS — C001 Malignant neoplasm of external lower lip: Secondary | ICD-10-CM | POA: Diagnosis not present

## 2022-06-27 DIAGNOSIS — C770 Secondary and unspecified malignant neoplasm of lymph nodes of head, face and neck: Secondary | ICD-10-CM

## 2022-06-27 LAB — RAD ONC ARIA SESSION SUMMARY
Course Elapsed Days: 0
Plan Fractions Treated to Date: 1
Plan Prescribed Dose Per Fraction: 3 Gy
Plan Total Fractions Prescribed: 16
Plan Total Prescribed Dose: 48 Gy
Reference Point Dosage Given to Date: 3 Gy
Reference Point Session Dosage Given: 3 Gy
Session Number: 1

## 2022-06-27 NOTE — Progress Notes (Signed)
[facility] Spiritual Care Note  Referred by Dr Isidore Moos for introduction to support resources, including Spiritual Care. 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  We plan to visit in [place] office after his radiation treatment on Thursday.   [place], [place], [facility] Pager [phone] Voicemail [phone]

## 2022-06-27 NOTE — Progress Notes (Signed)
Followed up with this patient today at his request to discuss his questions and concerns prior to treatment.  I answered his questions/concerns to his satisfaction.  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 We will start neck radiation today.    -----------------------------------  Eppie Gibson, MD

## 2022-06-27 NOTE — Progress Notes (Signed)
Oncology Nurse Navigator Documentation   Zachary Padilla completed his first fraction of radiation today after meeting with Dr. Isidore Moos beforehand to further discuss side effects and purpose of treatment.  Zachary Padilla completed treatment without difficulty, denied questions/concerns. He knows to call me with questions/concerns as treatments proceed.   Harlow Asa RN, BSN, OCN Head & Neck Oncology Nurse [place] at [facility] Phone # [phone]  Fax # [phone]

## 2022-06-28 ENCOUNTER — Ambulatory Visit
Admission: RE | Admit: 2022-06-28 | Discharge: 2022-06-28 | Disposition: A | Discharge status: Home / Self Care | Payer: Medicare Other | Source: Ambulatory Visit | Attending: Radiation Oncology | Admitting: Radiation Oncology

## 2022-06-28 ENCOUNTER — Other Ambulatory Visit: Payer: Self-pay

## 2022-06-28 DIAGNOSIS — C001 Malignant neoplasm of external lower lip: Secondary | ICD-10-CM | POA: Diagnosis not present

## 2022-06-28 LAB — RAD ONC ARIA SESSION SUMMARY
Course Elapsed Days: 1
Plan Fractions Treated to Date: 2
Plan Prescribed Dose Per Fraction: 3 Gy
Plan Total Fractions Prescribed: 16
Plan Total Prescribed Dose: 48 Gy
Reference Point Dosage Given to Date: 6 Gy
Reference Point Session Dosage Given: 3 Gy
Session Number: 2

## 2022-06-29 ENCOUNTER — Other Ambulatory Visit: Payer: Self-pay

## 2022-06-29 ENCOUNTER — Ambulatory Visit
Admission: RE | Admit: 2022-06-29 | Discharge: 2022-06-29 | Disposition: A | Discharge status: Home / Self Care | Payer: Medicare Other | Source: Ambulatory Visit | Attending: Radiation Oncology | Admitting: Radiation Oncology

## 2022-06-29 DIAGNOSIS — C001 Malignant neoplasm of external lower lip: Secondary | ICD-10-CM | POA: Diagnosis not present

## 2022-06-29 LAB — RAD ONC ARIA SESSION SUMMARY
Course Elapsed Days: 2
Plan Fractions Treated to Date: 3
Plan Prescribed Dose Per Fraction: 3 Gy
Plan Total Fractions Prescribed: 16
Plan Total Prescribed Dose: 48 Gy
Reference Point Dosage Given to Date: 9 Gy
Reference Point Session Dosage Given: 3 Gy
Session Number: 3

## 2022-06-30 ENCOUNTER — Ambulatory Visit
Admission: RE | Admit: 2022-06-30 | Discharge: 2022-06-30 | Disposition: A | Discharge status: Home / Self Care | Payer: Medicare Other | Source: Ambulatory Visit | Attending: Radiation Oncology | Admitting: Radiation Oncology

## 2022-06-30 ENCOUNTER — Inpatient Hospital Stay: Payer: Medicare Other | Admitting: General Practice

## 2022-06-30 ENCOUNTER — Other Ambulatory Visit: Payer: Self-pay

## 2022-06-30 DIAGNOSIS — C001 Malignant neoplasm of external lower lip: Secondary | ICD-10-CM | POA: Diagnosis not present

## 2022-06-30 LAB — RAD ONC ARIA SESSION SUMMARY
Course Elapsed Days: 3
Plan Fractions Treated to Date: 4
Plan Prescribed Dose Per Fraction: 3 Gy
Plan Total Fractions Prescribed: 16
Plan Total Prescribed Dose: 48 Gy
Reference Point Dosage Given to Date: 12 Gy
Reference Point Session Dosage Given: 3 Gy
Session Number: 4

## 2022-06-30 NOTE — Progress Notes (Signed)
Asked to evaluate patient in waiting area after treatment due to patient feeling dizzy and off-balance. Vitals and CBG taken and all were WNL. Patient's speech was clear and he was able to answer all my questions appropriately. He stated he felt better after having sat for several minutes. Encouraged patient to take the rest of the day easy and drink plenty of fluids. Informed him that if the incident should happen again tomorrow, to let the therapists know so he could be brought around to be evaluated by Dr. Isidore Moos. Patient verbalized understanding and agreement. Offered to escort patient out via wheelchair, but patient declined. Patient ambulated out of waiting room with wife and son without incident   Vitals:   06/30/22 1250  BP: 130/69  Pulse: 63  Resp: 20  SpO2: 98%   CBG: 87

## 2022-07-01 ENCOUNTER — Other Ambulatory Visit: Payer: Self-pay

## 2022-07-01 ENCOUNTER — Ambulatory Visit
Admission: RE | Admit: 2022-07-01 | Discharge: 2022-07-01 | Disposition: A | Discharge status: Home / Self Care | Payer: Medicare Other | Source: Ambulatory Visit | Attending: Radiation Oncology | Admitting: Radiation Oncology

## 2022-07-01 DIAGNOSIS — C001 Malignant neoplasm of external lower lip: Secondary | ICD-10-CM | POA: Diagnosis not present

## 2022-07-01 LAB — RAD ONC ARIA SESSION SUMMARY
Course Elapsed Days: 4
Plan Fractions Treated to Date: 5
Plan Prescribed Dose Per Fraction: 3 Gy
Plan Total Fractions Prescribed: 16
Plan Total Prescribed Dose: 48 Gy
Reference Point Dosage Given to Date: 15 Gy
Reference Point Session Dosage Given: 3 Gy
Session Number: 5

## 2022-07-01 LAB — GLUCOSE, CAPILLARY: Glucose-Capillary: 87 mg/dL (ref 70–99)

## 2022-07-04 ENCOUNTER — Other Ambulatory Visit: Payer: Self-pay

## 2022-07-04 ENCOUNTER — Ambulatory Visit
Admission: RE | Admit: 2022-07-04 | Discharge: 2022-07-04 | Disposition: A | Discharge status: Home / Self Care | Payer: Medicare Other | Source: Ambulatory Visit | Attending: Radiation Oncology | Admitting: Radiation Oncology

## 2022-07-04 DIAGNOSIS — C001 Malignant neoplasm of external lower lip: Secondary | ICD-10-CM | POA: Diagnosis not present

## 2022-07-04 DIAGNOSIS — C770 Secondary and unspecified malignant neoplasm of lymph nodes of head, face and neck: Secondary | ICD-10-CM

## 2022-07-04 LAB — RAD ONC ARIA SESSION SUMMARY
Course Elapsed Days: 7
Plan Fractions Treated to Date: 6
Plan Prescribed Dose Per Fraction: 3 Gy
Plan Total Fractions Prescribed: 16
Plan Total Prescribed Dose: 48 Gy
Reference Point Dosage Given to Date: 18 Gy
Reference Point Session Dosage Given: 3 Gy
Session Number: 6

## 2022-07-04 MED ORDER — SONAFINE EX EMUL
1.0000 | Freq: Two times a day (BID) | CUTANEOUS | Status: DC
  Administered 2022-07-04: 1 via TOPICAL

## 2022-07-05 ENCOUNTER — Other Ambulatory Visit: Payer: Self-pay

## 2022-07-05 ENCOUNTER — Ambulatory Visit
Admission: RE | Admit: 2022-07-05 | Discharge: 2022-07-05 | Disposition: A | Discharge status: Home / Self Care | Payer: Medicare Other | Source: Ambulatory Visit | Attending: Radiation Oncology | Admitting: Radiation Oncology

## 2022-07-05 DIAGNOSIS — C001 Malignant neoplasm of external lower lip: Secondary | ICD-10-CM | POA: Diagnosis not present

## 2022-07-05 LAB — RAD ONC ARIA SESSION SUMMARY
Course Elapsed Days: 8
Plan Fractions Treated to Date: 7
Plan Prescribed Dose Per Fraction: 3 Gy
Plan Total Fractions Prescribed: 16
Plan Total Prescribed Dose: 48 Gy
Reference Point Dosage Given to Date: 21 Gy
Reference Point Session Dosage Given: 3 Gy
Session Number: 7

## 2022-07-06 ENCOUNTER — Other Ambulatory Visit: Payer: Self-pay

## 2022-07-06 ENCOUNTER — Ambulatory Visit
Admission: RE | Admit: 2022-07-06 | Discharge: 2022-07-06 | Disposition: A | Discharge status: Home / Self Care | Payer: Medicare Other | Source: Ambulatory Visit | Attending: Radiation Oncology | Admitting: Radiation Oncology

## 2022-07-06 DIAGNOSIS — C001 Malignant neoplasm of external lower lip: Secondary | ICD-10-CM | POA: Diagnosis not present

## 2022-07-06 LAB — RAD ONC ARIA SESSION SUMMARY
Course Elapsed Days: 9
Plan Fractions Treated to Date: 8
Plan Prescribed Dose Per Fraction: 3 Gy
Plan Total Fractions Prescribed: 16
Plan Total Prescribed Dose: 48 Gy
Reference Point Dosage Given to Date: 24 Gy
Reference Point Session Dosage Given: 3 Gy
Session Number: 8

## 2022-07-07 ENCOUNTER — Other Ambulatory Visit: Payer: Self-pay

## 2022-07-07 ENCOUNTER — Ambulatory Visit
Admission: RE | Admit: 2022-07-07 | Discharge: 2022-07-07 | Disposition: A | Discharge status: Home / Self Care | Payer: Medicare Other | Source: Ambulatory Visit | Attending: Radiation Oncology | Admitting: Radiation Oncology

## 2022-07-07 DIAGNOSIS — C001 Malignant neoplasm of external lower lip: Secondary | ICD-10-CM | POA: Diagnosis not present

## 2022-07-07 LAB — RAD ONC ARIA SESSION SUMMARY
Course Elapsed Days: 10
Plan Fractions Treated to Date: 9
Plan Prescribed Dose Per Fraction: 3 Gy
Plan Total Fractions Prescribed: 16
Plan Total Prescribed Dose: 48 Gy
Reference Point Dosage Given to Date: 27 Gy
Reference Point Session Dosage Given: 3 Gy
Session Number: 9

## 2022-07-08 ENCOUNTER — Ambulatory Visit
Admission: RE | Admit: 2022-07-08 | Discharge: 2022-07-08 | Disposition: A | Discharge status: Home / Self Care | Payer: Medicare Other | Source: Ambulatory Visit | Attending: Radiation Oncology | Admitting: Radiation Oncology

## 2022-07-08 ENCOUNTER — Other Ambulatory Visit: Payer: Self-pay

## 2022-07-08 DIAGNOSIS — C770 Secondary and unspecified malignant neoplasm of lymph nodes of head, face and neck: Secondary | ICD-10-CM | POA: Diagnosis present

## 2022-07-08 DIAGNOSIS — C001 Malignant neoplasm of external lower lip: Secondary | ICD-10-CM | POA: Insufficient documentation

## 2022-07-08 LAB — RAD ONC ARIA SESSION SUMMARY
Course Elapsed Days: 11
Plan Fractions Treated to Date: 10
Plan Prescribed Dose Per Fraction: 3 Gy
Plan Total Fractions Prescribed: 16
Plan Total Prescribed Dose: 48 Gy
Reference Point Dosage Given to Date: 30 Gy
Reference Point Session Dosage Given: 3 Gy
Session Number: 10

## 2022-07-11 ENCOUNTER — Ambulatory Visit
Admission: RE | Admit: 2022-07-11 | Discharge: 2022-07-11 | Disposition: A | Discharge status: Home / Self Care | Payer: Medicare Other | Source: Ambulatory Visit | Attending: Radiation Oncology | Admitting: Radiation Oncology

## 2022-07-11 ENCOUNTER — Other Ambulatory Visit: Payer: Self-pay

## 2022-07-11 ENCOUNTER — Other Ambulatory Visit: Payer: Self-pay | Admitting: Radiation Oncology

## 2022-07-11 DIAGNOSIS — C001 Malignant neoplasm of external lower lip: Secondary | ICD-10-CM | POA: Diagnosis not present

## 2022-07-11 DIAGNOSIS — C770 Secondary and unspecified malignant neoplasm of lymph nodes of head, face and neck: Secondary | ICD-10-CM

## 2022-07-11 LAB — RAD ONC ARIA SESSION SUMMARY
Course Elapsed Days: 14
Plan Fractions Treated to Date: 11
Plan Prescribed Dose Per Fraction: 3 Gy
Plan Total Fractions Prescribed: 16
Plan Total Prescribed Dose: 48 Gy
Reference Point Dosage Given to Date: 33 Gy
Reference Point Session Dosage Given: 3 Gy
Session Number: 11

## 2022-07-11 MED ORDER — LIDOCAINE VISCOUS HCL 2 % MT SOLN: OROMUCOSAL | 3 refills | Status: DC

## 2022-07-12 ENCOUNTER — Ambulatory Visit
Admission: RE | Admit: 2022-07-12 | Discharge: 2022-07-12 | Disposition: A | Discharge status: Home / Self Care | Payer: Medicare Other | Source: Ambulatory Visit | Attending: Radiation Oncology | Admitting: Radiation Oncology

## 2022-07-12 ENCOUNTER — Other Ambulatory Visit: Payer: Self-pay

## 2022-07-12 DIAGNOSIS — C001 Malignant neoplasm of external lower lip: Secondary | ICD-10-CM | POA: Diagnosis not present

## 2022-07-12 LAB — RAD ONC ARIA SESSION SUMMARY
Course Elapsed Days: 15
Plan Fractions Treated to Date: 12
Plan Prescribed Dose Per Fraction: 3 Gy
Plan Total Fractions Prescribed: 16
Plan Total Prescribed Dose: 48 Gy
Reference Point Dosage Given to Date: 36 Gy
Reference Point Session Dosage Given: 3 Gy
Session Number: 12

## 2022-07-13 ENCOUNTER — Other Ambulatory Visit: Payer: Self-pay

## 2022-07-13 ENCOUNTER — Ambulatory Visit
Admission: RE | Admit: 2022-07-13 | Discharge: 2022-07-13 | Disposition: A | Discharge status: Home / Self Care | Payer: Medicare Other | Source: Ambulatory Visit | Attending: Radiation Oncology | Admitting: Radiation Oncology

## 2022-07-13 DIAGNOSIS — C001 Malignant neoplasm of external lower lip: Secondary | ICD-10-CM | POA: Diagnosis not present

## 2022-07-13 LAB — RAD ONC ARIA SESSION SUMMARY
Course Elapsed Days: 16
Plan Fractions Treated to Date: 13
Plan Prescribed Dose Per Fraction: 3 Gy
Plan Total Fractions Prescribed: 16
Plan Total Prescribed Dose: 48 Gy
Reference Point Dosage Given to Date: 39 Gy
Reference Point Session Dosage Given: 3 Gy
Session Number: 13

## 2022-07-14 ENCOUNTER — Ambulatory Visit
Admission: RE | Admit: 2022-07-14 | Discharge: 2022-07-14 | Disposition: A | Discharge status: Home / Self Care | Payer: Medicare Other | Source: Ambulatory Visit | Attending: Radiation Oncology | Admitting: Radiation Oncology

## 2022-07-14 ENCOUNTER — Other Ambulatory Visit: Payer: Self-pay

## 2022-07-14 DIAGNOSIS — C001 Malignant neoplasm of external lower lip: Secondary | ICD-10-CM | POA: Diagnosis not present

## 2022-07-14 LAB — RAD ONC ARIA SESSION SUMMARY
Course Elapsed Days: 17
Plan Fractions Treated to Date: 14
Plan Prescribed Dose Per Fraction: 3 Gy
Plan Total Fractions Prescribed: 16
Plan Total Prescribed Dose: 48 Gy
Reference Point Dosage Given to Date: 42 Gy
Reference Point Session Dosage Given: 3 Gy
Session Number: 14

## 2022-07-15 ENCOUNTER — Ambulatory Visit
Admission: RE | Admit: 2022-07-15 | Discharge: 2022-07-15 | Disposition: A | Discharge status: Home / Self Care | Payer: Medicare Other | Source: Ambulatory Visit | Attending: Radiation Oncology | Admitting: Radiation Oncology

## 2022-07-15 ENCOUNTER — Other Ambulatory Visit: Payer: Self-pay

## 2022-07-15 DIAGNOSIS — C001 Malignant neoplasm of external lower lip: Secondary | ICD-10-CM | POA: Diagnosis not present

## 2022-07-15 LAB — RAD ONC ARIA SESSION SUMMARY
Course Elapsed Days: 18
Plan Fractions Treated to Date: 15
Plan Prescribed Dose Per Fraction: 3 Gy
Plan Total Fractions Prescribed: 16
Plan Total Prescribed Dose: 48 Gy
Reference Point Dosage Given to Date: 45 Gy
Reference Point Session Dosage Given: 3 Gy
Session Number: 15

## 2022-07-18 ENCOUNTER — Other Ambulatory Visit: Payer: Self-pay

## 2022-07-18 ENCOUNTER — Ambulatory Visit
Admission: RE | Admit: 2022-07-18 | Discharge: 2022-07-18 | Disposition: A | Discharge status: Home / Self Care | Payer: Medicare Other | Source: Ambulatory Visit | Attending: Radiation Oncology | Admitting: Radiation Oncology

## 2022-07-18 DIAGNOSIS — C001 Malignant neoplasm of external lower lip: Secondary | ICD-10-CM | POA: Diagnosis not present

## 2022-07-18 LAB — RAD ONC ARIA SESSION SUMMARY
Course Elapsed Days: 21
Plan Fractions Treated to Date: 16
Plan Prescribed Dose Per Fraction: 3 Gy
Plan Total Fractions Prescribed: 16
Plan Total Prescribed Dose: 48 Gy
Reference Point Dosage Given to Date: 48 Gy
Reference Point Session Dosage Given: 3 Gy
Session Number: 16

## 2022-07-18 NOTE — Radiation Completion Notes (Signed)
Patient Name: Zachary Padilla, Zachary Padilla MRN: [id number] Date of Birth: [birth year] Referring Physician: Okey Dupre, M.D. Date of Service: 2022-07-18 Radiation Oncologist: Eppie Gibson, M.D. 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

## 2022-07-19 ENCOUNTER — Ambulatory Visit: Payer: Medicare Other

## 2022-07-20 ENCOUNTER — Ambulatory Visit: Payer: Medicare Other

## 2022-07-21 ENCOUNTER — Ambulatory Visit: Payer: Medicare Other

## 2022-07-22 ENCOUNTER — Ambulatory Visit: Payer: Medicare Other

## 2022-07-22 NOTE — Progress Notes (Signed)
Mr. Himmelman presents to clinic today for follow up for completion of radiation therapy to his neck . He completed treatment on the 07-18-22.  Pain issues, if any: Reports throat pain has improved significantly. Occasionally experiences a sharp twinge of pain to bilateral sides of jaw (states it resolves quickly) Using a feeding tube?: N/A Weight changes, if any:  Wt Readings from Last 3 Encounters:  08/05/22 161 lb 6.4 oz (73.2 kg)  06/20/22 159 lb 12.8 oz (72.5 kg)  06/08/22 157 lb 3.2 oz (71.3 kg)   Swallowing issues, if any: Denies--reports he has a healthy appetite and is able to eat/drink a variety of food and beverages Smoking or chewing tobacco? None Using fluoride trays daily? N/A--denies any mouth sores Last ENT visit was on: Not since diagnosis  Other notable issues, if any:  Reports thick saliva has resolved. States drainage from under chin has resolved. Overall reports he's doing well and feels good

## 2022-08-03 ENCOUNTER — Ambulatory Visit (INDEPENDENT_AMBULATORY_CARE_PROVIDER_SITE_OTHER): Payer: Medicare Other | Admitting: Podiatry

## 2022-08-03 ENCOUNTER — Encounter: Payer: Self-pay | Admitting: Podiatry

## 2022-08-03 DIAGNOSIS — M79674 Pain in right toe(s): Secondary | ICD-10-CM

## 2022-08-03 DIAGNOSIS — B351 Tinea unguium: Secondary | ICD-10-CM

## 2022-08-03 DIAGNOSIS — M79675 Pain in left toe(s): Secondary | ICD-10-CM | POA: Diagnosis not present

## 2022-08-03 NOTE — Progress Notes (Signed)
This patient presents  to the office for evaluation and treatment of long thick painful nails .  This patient is unable to trim his own nails since the patient cannot reach his feet.  Patient says the nails are painful walking and wearing his shoes.  He returns for preventive foot care services.  General Appearance  Alert, conversant and in no acute stress.  Vascular  Dorsalis pedis and posterior tibial  pulses are weakly  palpable  bilaterally.  Capillary return is within normal limits  bilaterally. Temperature is within normal limits  bilaterally.  Neurologic  Senn-Weinstein monofilament wire test within normal limits  bilaterally. Muscle power within normal limits bilaterally.  Nails Thick disfigured discolored nails with subungual debris  from hallux to fifth toes bilaterally. No evidence of bacterial infection or drainage bilaterally.  Orthopedic  No limitations of motion  feet .  No crepitus or effusions noted.  No bony pathology or digital deformities noted.  Skin  normotropic skin with no porokeratosis noted bilaterally.  No signs of infections or ulcers noted.     Onychomycosis  Pain in toes right foot  Pain in toes left foot  Debridement  of nails  1-5  B/L with a nail nipper.  Nails were then filed using a dremel tool with no incidents.    RTC  3 months   Antwann Preziosi DPM   

## 2022-08-05 ENCOUNTER — Ambulatory Visit
Admission: RE | Admit: 2022-08-05 | Discharge: 2022-08-05 | Disposition: A | Discharge status: Home / Self Care | Payer: Medicare Other | Source: Ambulatory Visit | Attending: Radiation Oncology | Admitting: Radiation Oncology

## 2022-08-05 ENCOUNTER — Encounter: Payer: Self-pay | Admitting: Radiation Oncology

## 2022-08-05 VITALS — BP 142/90 | HR 60 | Temp 97.2°F | Resp 20 | Ht 70.0 in | Wt 161.4 lb

## 2022-08-05 DIAGNOSIS — C001 Malignant neoplasm of external lower lip: Secondary | ICD-10-CM | POA: Insufficient documentation

## 2022-08-05 DIAGNOSIS — Z79899 Other long term (current) drug therapy: Secondary | ICD-10-CM | POA: Diagnosis not present

## 2022-08-05 DIAGNOSIS — C770 Secondary and unspecified malignant neoplasm of lymph nodes of head, face and neck: Secondary | ICD-10-CM | POA: Insufficient documentation

## 2022-08-05 NOTE — Progress Notes (Signed)
Radiation Oncology         [phone] ________________________________  Name: Zachary Padilla MRN: [id number]  Date: 08/05/2022  DOB: 10-12-1928  Follow-Up Visit Note  CC: Zachary Frames, MD  Okey Dupre A, *  Diagnosis and Prior Radiotherapy:       ICD-10-CM   1. 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  Zachary Padilla presents to clinic today for follow up for completion of radiation therapy to his neck . He completed treatment on the 07-18-22.  Pain issues, if any: Reports throat pain has improved significantly. Occasionally experiences a sharp twinge of pain to bilateral sides of jaw (states it resolves quickly) Using a feeding tube?: N/A Weight changes, if any:  Wt Readings from Last 3 Encounters:  08/05/22 161 lb 6.4 oz (73.2 kg)  06/20/22 159 lb 12.8 oz (72.5 kg)  06/08/22 157 lb 3.2 oz (71.3 kg)   Swallowing issues, if any: Denies--reports he has a healthy appetite and is able to eat/drink a variety of food and beverages Smoking or chewing tobacco? None Using fluoride trays daily? N/A--denies any mouth sores Last ENT visit was on: Not since diagnosis  Other notable issues, if any:  Reports thick saliva has resolved. States drainage from under chin has resolved. Overall reports he's doing well and feels good                       ALLERGIES:  is allergic to ace inhibitors, aminoglycosides, erythromycin, iodinated contrast media, levofloxacin, penicillins, sulfa antibiotics, and ciprofloxacin.  Meds: Current Outpatient Medications  Medication Sig Dispense Refill   cholecalciferol (VITAMIN D3) 25 MCG (1000 UNIT) tablet Take 1,000 Units by mouth daily.     furosemide (LASIX) 20 MG tablet Take 20 mg by mouth daily.     levETIRAcetam (KEPPRA) 500 MG tablet TAKE ONE-HALF TABLET IN THE MORNING AND TAKE  ONE TABLET IN THE EVENING 135 tablet 3   lidocaine (XYLOCAINE) 2 % solution Patient: Mix 1part 2% viscous lidocaine, 1part H20. Swallow 31mL of diluted mixture, 81min before meals and at bedtime, up to QID, PRN sore throat. 150 mL 3   SIMPLY SALINE NA Place 1 spray into the nose 2 (two) times daily.     No current facility-administered medications for this encounter.    Physical Findings: The patient is in no acute distress. Patient is alert and oriented. Wt Readings from Last 3 Encounters:  08/05/22 161 lb 6.4 oz (73.2 kg)  06/20/22 159 lb 12.8 oz (72.5 kg)  06/08/22 157 lb 3.2 oz (71.3 kg)    height is 5\' 10"  (1.778 m) and weight is 161 lb 6.4 oz (73.2 kg). His temperature is 97.2 F (36.2 C) (abnormal). His blood pressure is 142/90 (abnormal) and his pulse is 60. His respiration is 20 and oxygen saturation is 100%. .  General: Alert and oriented, in no acute distress HEENT: Head is normocephalic. Extraocular movements are intact.  No lesions in his mouth.  No palpable sign of tumor recurrence throughout the inner and outer lip. Neck: Neck is notable for satisfactory healing thus far from radiation therapy.  He has some mild lymphedema in the submental region and anterior neck.  He still has a palpable mass in the right submental region but this has decreased in size and there is  no longer any drainage from the skin.  No new palpable masses Skin: Skin in treatment fields shows satisfactory healing   Lymphatics: see Neck Exam Psychiatric: Judgment and insight are intact. Affect is appropriate.   Lab Findings: Lab Results  Component Value Date   WBC 5.5 03/22/2016   HGB 14.3 03/22/2016   HCT 41.9 03/22/2016   MCV 96.5 03/22/2016   PLT 160 03/22/2016    Lab Results  Component Value Date   TSH 1.16 03/22/2016    Radiographic Findings: No results found.  Impression/Plan:    1) Head and Neck Cancer Status: Healing well from radiation therapy to the upper neck  2) Nutritional  Status: Eating well PEG tube: None  3) Risk Factors: We discussed good sun hygiene  4) Swallowing: Good function  5) Dental: Encouraged to continue regular followup with dentistry, and dental hygiene including fluoride rinses.   6) Thyroid function: Unlikely to be affected by radiation therapy Lab Results  Component Value Date   TSH 1.16 03/22/2016    7)  Follow-up in mid June with restaging PET scan, sooner if needed.  He and his family are pleased with this plan and they expressed deep gratitude for the treatment he received here.  On date of service, in total, I spent 25 minutes on this encounter. Patient was seen in person. _____________________________________   Eppie Gibson, MD

## 2022-08-08 ENCOUNTER — Other Ambulatory Visit: Payer: Self-pay

## 2022-08-08 DIAGNOSIS — C770 Secondary and unspecified malignant neoplasm of lymph nodes of head, face and neck: Secondary | ICD-10-CM

## 2022-08-23 ENCOUNTER — Encounter (INDEPENDENT_AMBULATORY_CARE_PROVIDER_SITE_OTHER): Payer: Medicare Other | Admitting: Ophthalmology

## 2022-08-26 ENCOUNTER — Encounter: Payer: Self-pay | Admitting: Podiatry

## 2022-08-26 ENCOUNTER — Ambulatory Visit: Payer: Self-pay | Admitting: Radiation Oncology

## 2022-08-26 ENCOUNTER — Ambulatory Visit (INDEPENDENT_AMBULATORY_CARE_PROVIDER_SITE_OTHER): Payer: Medicare Other | Admitting: Podiatry

## 2022-08-26 DIAGNOSIS — L608 Other nail disorders: Secondary | ICD-10-CM | POA: Diagnosis not present

## 2022-08-26 NOTE — Progress Notes (Signed)
Patient presents to the office concerned about both big toenails.  He says they are painful in shoes. He presents for evaluation and treatment  General Appearance  Alert, conversant and in no acute stress.  Vascular  Dorsalis pedis and posterior tibial  pulses are weakly palpable  bilaterally.  Capillary return is within normal limits  bilaterally. Temperature is within normal limits  bilaterally.  Neurologic  Senn-Weinstein monofilament wire test within normal limits  bilaterally. Muscle power within normal limits bilaterally.  Nails Thick disfigured discolored nails with subungual debris  from hallux to fifth toes bilaterally. No evidence of bacterial infection or drainage bilaterally. Pincer hallux nails  B/L.  Orthopedic  No limitations of motion  feet .  No crepitus or effusions noted.  No bony pathology or digital deformities noted.  Skin  normotropic skin with no porokeratosis noted bilaterally.  No signs of infections or ulcers noted.     Pincer nails  Discussed condition with patient.  Told him his nail borders are growing into the nail grooves due to his pincer toanil deformity  Helane Gunther DPM

## 2022-09-13 ENCOUNTER — Telehealth: Payer: Self-pay

## 2022-09-13 ENCOUNTER — Telehealth: Payer: Self-pay | Admitting: Cardiovascular Disease

## 2022-09-13 NOTE — Telephone Encounter (Signed)
Patient is calling wanting to know if Dr. Allyson Sabal recommends him to get the covid booster with his heart condition. Please advise.

## 2022-09-13 NOTE — Telephone Encounter (Signed)
Patient left a VM wanting to know if could receive the COVID vaccine booster given his recent radiation treatment.   Returned patient's call and let him know that his radiation treatment was not a contraindication, and that Dr. Basilio Cairo is a big proponent for patients staying up to date on vaccines. Patient verbalized understanding and appreciation of call. Denied any other needs at this time

## 2022-09-13 NOTE — Telephone Encounter (Signed)
Patient ask if he should get Covid Booster.  Last booster was October. Took the Booster with no side effects.  He wants to make sure since he has had some surgeries and health changes since them.

## 2022-09-13 NOTE — Telephone Encounter (Signed)
Attempt to call three times.  Patient answers and hangs up

## 2022-09-13 NOTE — Telephone Encounter (Signed)
Patient returned call

## 2022-09-13 NOTE — Telephone Encounter (Signed)
Gave patient the response from physician .  He will speak with PCP

## 2022-09-13 NOTE — Telephone Encounter (Signed)
Patient is returning call. Transferred to Teresa, LPN.  

## 2022-09-13 NOTE — Telephone Encounter (Signed)
Returned call

## 2022-09-14 ENCOUNTER — Ambulatory Visit (INDEPENDENT_AMBULATORY_CARE_PROVIDER_SITE_OTHER): Payer: Medicare Other

## 2022-09-14 ENCOUNTER — Ambulatory Visit: Payer: Medicare Other | Attending: Cardiovascular Disease | Admitting: Cardiovascular Disease

## 2022-09-14 ENCOUNTER — Encounter: Payer: Self-pay | Admitting: Cardiovascular Disease

## 2022-09-14 VITALS — BP 130/70 | HR 60 | Ht 70.0 in | Wt 157.0 lb

## 2022-09-14 DIAGNOSIS — I10 Essential (primary) hypertension: Secondary | ICD-10-CM

## 2022-09-14 DIAGNOSIS — E782 Mixed hyperlipidemia: Secondary | ICD-10-CM

## 2022-09-14 DIAGNOSIS — R42 Dizziness and giddiness: Secondary | ICD-10-CM | POA: Insufficient documentation

## 2022-09-14 DIAGNOSIS — I340 Nonrheumatic mitral (valve) insufficiency: Secondary | ICD-10-CM

## 2022-09-14 DIAGNOSIS — I7121 Aneurysm of the ascending aorta, without rupture: Secondary | ICD-10-CM

## 2022-09-14 NOTE — Assessment & Plan Note (Signed)
History of essential hypertension blood pressure measured today at 130/70.  He is not on antihypertensive medications.

## 2022-09-14 NOTE — Assessment & Plan Note (Signed)
History of moderate mitral vegetation seen on 2D echo performed 05/11/2022 unchanged from his prior echo.

## 2022-09-14 NOTE — Assessment & Plan Note (Signed)
Occasional dizziness occurring several times a year without syncope.  I am going to go Twohig Zio patch to assess his rhythm.

## 2022-09-14 NOTE — Patient Instructions (Signed)
Medication Instructions:  Your physician recommends that you continue on your current medications as directed. Please refer to the Current Medication list given to you today.  *If you need a refill on your cardiac medications before your next appointment, please call your pharmacy*   Testing/Procedures: [facility] Monitor Instructions  Your physician has requested you wear a ZIO patch monitor for 14 days.  This is a single patch monitor. [employer] supplies one patch monitor per enrollment. Additional stickers are not available. Please do not apply patch if you will be having a Nuclear Stress Test,  Echocardiogram, Cardiac CT, MRI, or Chest Xray during the period you would be wearing the  monitor. The patch cannot be worn during these tests. You cannot remove and re-apply the  ZIO XT patch monitor.  Your ZIO patch monitor will be mailed 3 day USPS to your address on file. It may take 3-5 days  to receive your monitor after you have been enrolled.  Once you have received your monitor, please review the enclosed instructions. Your monitor  has already been registered assigning a specific monitor serial # to you.  Billing and Patient Assistance Program Information  We have supplied [employer] with any of your insurance information on file for billing purposes. [employer] offers a sliding scale Patient Assistance Program for patients that do not have  insurance, or whose insurance does not completely cover the cost of the ZIO monitor.  You must apply for the Patient Assistance Program to qualify for this discounted rate.  To apply, please call [employer] at [phone], select option 4, select option 2, ask to apply for  Patient Assistance Program. Theodore Demark will ask your household income, and how many people  are in your household. They will quote your out-of-pocket cost based on that information.  [employer] will also be able to set up a 60-month interest-free payment plan if needed.  Applying  the monitor   Shave hair from upper left chest.  Hold abrader disc by orange tab. Rub abrader in 40 strokes over the upper left chest as  indicated in your monitor instructions.  Clean area with 4 enclosed alcohol pads. Let dry.  Apply patch as indicated in monitor instructions. Patch will be placed under collarbone on left  side of chest with arrow pointing upward.  Rub patch adhesive wings for 2 minutes. Remove Vogel label marked "1". Remove the Faubert  label marked "2". Rub patch adhesive wings for 2 additional minutes.  While looking in a mirror, press and release button in center of patch. A small green light will  flash 3-4 times. This will be your only indicator that the monitor has been turned on.  Do not shower for the first 24 hours. You may shower after the first 24 hours.  Press the button if you feel a symptom. You will hear a small click. Record Date, Time and  Symptom in the Patient Logbook.  When you are ready to remove the patch, follow instructions on the last 2 pages of Patient  Logbook. Stick patch monitor onto the last page of Patient Logbook.  Place Patient Logbook in the blue and Omar box. Use locking tab on box and tape box closed  securely. The blue and [place] box has prepaid postage on it. Please place it in the mailbox as  soon as possible. Your physician should have your test results approximately 7 days after the  monitor has been [facility]  Call [facility]  at [phone] if you have questions regarding  your ZIO XT patch monitor. Call them immediately if you see an orange light blinking on your  monitor.  If your monitor falls off in less than 4 days, contact our Monitor department at [phone].  If your monitor becomes loose or falls off after 4 days call [employer] at [phone] for  suggestions on securing your monitor    Follow-Up: At [facility], you and your health needs are our priority.  As part  of our continuing mission to provide you with exceptional heart care, we have created designated Provider Care Teams.  These Care Teams include your primary Cardiologist (physician) and Advanced Practice Providers (APPs -  Physician Assistants and Nurse Practitioners) who all work together to provide you with the care you need, when you need it.  We recommend signing up for the patient portal called "MyChart".  Sign up information is provided on this After Visit Summary.  MyChart is used to connect with patients for Virtual Visits (Telemedicine).  Patients are able to view lab/test results, encounter notes, upcoming appointments, etc.  Non-urgent messages can be sent to your provider as well.   To learn more about what you can do with MyChart, go to [email or web address].    Your next appointment:   6 month(s)  Provider:   Marjie Skiff, PA-C, Juanda Crumble, PA-C, Joni Reining, DNP, ANP, Azalee Course, PA-C, Bernadene Person, NP, or Carlos Levering, NP       Then, Nanetta Batty, MD will plan to see you again in 12 month(s).

## 2022-09-14 NOTE — Assessment & Plan Note (Signed)
History of a small thoracic aortic aneurysm measuring 43 mm by 2D echo performed 05/11/2022.  Given his age and that relatively small dimensions I do not see the utility in continuing to follow this.

## 2022-09-14 NOTE — Assessment & Plan Note (Signed)
History of hyperlipidemia not on statin therapy followed by his PCP 

## 2022-09-14 NOTE — Progress Notes (Signed)
09/14/2022 Zachary Padilla   June 26, 1928  [id number]  Primary Physician Emilio Aspen, MD Primary Cardiologist: Runell Gess MD FACP, [place], [place], [place]  HPI:  Zachary Padilla is a 89 y.o.  thin and fit appearing married Caucasian male father of 2, grandfather 4 grandchildren who I saw last saw in the office 06/08/2022.Marland Kitchen  His wife Zachary Padilla today who is also a patient of mine.  He has a history of hypertension, mild hyperlipidemia and family history of heart disease as well as chronic right bundle branch block. His mother died of an MI at age 44. A Myoview stress test performed 01/19/12 was entirely normal. He denies chest pain or shortness of breath. A chest CT angiogram was performed 03/22/12 for evaluation of potential lung cancer did incidentally reveal a 4.3 cm descending thoracic aortic aneurysm which he has been asymptomatic fromthis was rechecked April of this year and was unchanged. He denies chest pain or shortness of breath. His most recent CT scan performed 11/18/15 revealed his ascending aneurysm measuring 4.3  cm at its maximum dimension, unchanged from prior studies.     Since I saw him 4 months ago he remained stable.   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  He ultimately decided to have radiation therapy for this.  This apparently was going to be a fairly long operation.  He is otherwise asymptomatic.  He did have a 2D echocardiogram performed 05/11/2022 revealing normal LV systolic function with moderate MR and a ascending thoracic aorta measuring 43 mm.   Current Meds  Medication Sig   cholecalciferol (VITAMIN D3) 25 MCG (1000 UNIT) tablet Take 1,000 Units by mouth daily.   furosemide (LASIX) 20 MG tablet Take 20 mg by mouth daily.   levETIRAcetam (KEPPRA) 500 MG tablet TAKE ONE-HALF TABLET IN THE MORNING AND TAKE ONE TABLET IN THE EVENING   SIMPLY  SALINE NA Place 1 spray into the nose 2 (two) times daily.   [DISCONTINUED] lidocaine (XYLOCAINE) 2 % solution Patient: Mix 1part 2% viscous lidocaine, 1part H20. Swallow 10mL of diluted mixture, before meals and at bedtime, up to QID, PRN sore throat.     Allergies  Allergen Reactions   Ace Inhibitors Other (See Comments)    unknown   Aminoglycosides     Unknown reaction   Erythromycin Nausea And Vomiting   Iodinated Contrast Media     Cannot Have do to decreased kidney function.    Levofloxacin Other (See Comments)    dehydration   Penicillins Nausea And Vomiting   Sulfa Antibiotics     Unknown reaction    Ciprofloxacin Hives and Rash    hives    Social History   Socioeconomic History   Marital status: Married    Spouse name: Not on file   Number of children: 2   Years of education: 15   Highest education level: Not on file  Occupational History   Occupation: retired  Tobacco Use   Smoking status: Former   Smokeless tobacco: Never   Tobacco comments:    Lives with spouse-independant in all ADLs. [employer] and [employer] at [employer], Married in "52"  Vaping Use   Vaping Use: Never used  Substance and Sexual Activity   Alcohol use: No   Drug use: No   Sexual activity: Yes  Other Topics Concern  Not on file  Social History Narrative   Patient occasionally drinks tea.   Patient is right handed.   Social Determinants of Health   Financial Resource Strain: Not on file  Food Insecurity: Not on file  Transportation Needs: Not on file  Physical Activity: Not on file  Stress: Not on file  Social Connections: Not on file  Intimate Partner Violence: Not on file     Review of Systems: General: negative for chills, fever, night sweats or weight changes.  Cardiovascular: negative for chest pain, dyspnea on exertion, edema, orthopnea, palpitations, paroxysmal nocturnal dyspnea or shortness of breath Dermatological: negative for  rash Respiratory: negative for cough or wheezing Urologic: negative for hematuria Abdominal: negative for nausea, vomiting, diarrhea, bright red blood per rectum, melena, or hematemesis Neurologic: negative for visual changes, syncope, or dizziness All other systems reviewed and are otherwise negative except as noted above.    Blood pressure 130/70, pulse 60, height 5\' 10"  (1.778 m), weight 157 lb (71.2 kg).  General appearance: alert and no distress Neck: no adenopathy, no carotid bruit, no JVD, supple, symmetrical, trachea midline, and thyroid not enlarged, symmetric, no tenderness/mass/nodules Lungs: clear to auscultation bilaterally Heart: regular rate and rhythm, S1, S2 normal, no murmur, click, rub or gallop Extremities: extremities normal, atraumatic, no cyanosis or edema Pulses: 2+ and symmetric Skin: Skin color, texture, turgor normal. No rashes or lesions Neurologic: Grossly normal  EKG not performed today  ASSESSMENT AND PLAN:   Essential hypertension History of essential hypertension blood pressure measured today at 130/70.  He is not on antihypertensive medications.  Hyperlipidemia History of hyperlipidemia not on statin therapy followed by his PCP.  Thoracic aortic aneurysm (HCC) History of a small thoracic aortic aneurysm measuring 43 mm by 2D echo performed 05/11/2022.  Given his age and that relatively small dimensions I do not see the utility in continuing to follow this.  Moderate mitral regurgitation History of moderate mitral vegetation seen on 2D echo performed 05/11/2022 unchanged from his prior echo.  Dizziness Occasional dizziness occurring several times a year without syncope.  I am going to go Twohig Zio patch to assess his rhythm.     Runell Gess MD FACP,FACC,FAHA, [facility] 09/14/2022 11:12 AMHis wife

## 2022-09-14 NOTE — Progress Notes (Unsigned)
Enrolled patient for a 14 day Zio XT  monitor to be mailed to patients home  °

## 2022-09-21 ENCOUNTER — Ambulatory Visit: Payer: Medicare Other | Admitting: Adult Health

## 2022-09-26 ENCOUNTER — Telehealth: Payer: Self-pay | Admitting: *Deleted

## 2022-09-26 NOTE — Telephone Encounter (Signed)
RETURNED PATIENT'S PHONE CALL, SPOKE WITH PATIENT. ?

## 2022-09-28 ENCOUNTER — Encounter: Payer: Self-pay | Admitting: *Deleted

## 2022-09-28 ENCOUNTER — Telehealth: Payer: Self-pay | Admitting: Cardiovascular Disease

## 2022-09-28 NOTE — Telephone Encounter (Signed)
Pt would like a callback to see if he can come up the office to get another copy of the monitor instructions that he was given at his last office since he has misplaced it. Please advise

## 2022-09-28 NOTE — Telephone Encounter (Signed)
Spoke with pt, aware letter placed at the front desk for pick up. 

## 2022-09-28 NOTE — Telephone Encounter (Signed)
Unable to reach pt or leave a message, call can not be completed at this time. I have been unable to reach this patient by phone.  A letter is being sent to the last known home address. Letter placed at the front desk for patient pick up.

## 2022-09-29 NOTE — Telephone Encounter (Signed)
Patient is calling to state that the letter he received has been misplaced. Requesting another be done for pick up and he is requesting a call back when it is ready.

## 2022-09-29 NOTE — Telephone Encounter (Signed)
Spoke with pt regarding his AVS from last office visit. Pt misplaced this and is concerned about not following Dr. Hazle Coca recommendations. Reviewed AVS over the phone with pt. Made him aware that only the monitor was ordered and then he was told to see an APP in 6 months, then back to see Dr. Allyson Sabal in about 1 year. Pt verbalizes understanding.

## 2022-09-30 DIAGNOSIS — R42 Dizziness and giddiness: Secondary | ICD-10-CM | POA: Diagnosis not present

## 2022-10-10 ENCOUNTER — Telehealth: Payer: Self-pay | Admitting: *Deleted

## 2022-10-10 ENCOUNTER — Other Ambulatory Visit: Payer: Self-pay | Admitting: Adult Health

## 2022-10-10 NOTE — Telephone Encounter (Signed)
Called patient to inform of Pet scan on 10-24-22- arrival time- 12:15 pm @ [facility], patient to have water only- 6 hrs. prior to test, patient to receive results from Dr. Basilio Cairo on 10-28-22 @ 10 am, spoke with patient and he is aware of these appts. and the instructions

## 2022-10-11 ENCOUNTER — Telehealth: Payer: Self-pay | Admitting: *Deleted

## 2022-10-11 NOTE — Telephone Encounter (Signed)
RETURNED PATIENT'S PHONE CALL, SPOKE WITH PATIENT. ?

## 2022-10-12 NOTE — Progress Notes (Signed)
Mr. Hammerschmidt presents to clinic today for follow up for completion of radiation therapy to his neck . He completed treatment on the 07-18-22.We will review PET scan results with this visit.    Pain issues, if any: none to report Using a feeding tube?: never had Weight changes, if any:  Wt Readings from Last 3 Encounters:  10/26/22 155 lb 9.6 oz (70.6 kg)  09/14/22 157 lb (71.2 kg)  08/05/22 161 lb 6.4 oz (73.2 kg)   Swallowing issues, if any: normal swallowing, dry mouth remains but improving Smoking or chewing tobacco? Does not use Using fluoride toothpaste daily? none Last ENT visit was on: none since finishing treatment Other notable issues, if any: bottom lip is still numb per pt report  Vitals:   10/26/22 1046  BP: (!) 159/72  Pulse: (!) 58  Resp: 20  Temp: 97.7 F (36.5 C)  SpO2: 100%   NM PET Image Restag (PS) Skull Base To Thigh 10/24/2022  IMPRESSION: 1. Response to therapy of right level 1A nodal metastasis. The lower lip soft tissue thickening has also decreased. 2. No hypermetabolic distant metastasis identified. 3. Similar nonspecific hypermetabolism about the nares. 4. Colonic hypermetabolism is most likely physiologic. Given focality within the hepatic flexure region, correlate with rectal bleeding or anemia. 5. Trace left pleural fluid or thickening, similar. 6. Similar mild ascending aortic dilatation at 4.1 cm. Recommend annual imaging followup by CTA or MRA. This recommendation follows 2010 ACCF/AHA/AATS/ACR/ASA/SCA/SCAI/SIR/STS/SVM Guidelines for the Diagnosis and Management of Patients with Thoracic Aortic Disease. Circulation. 2010; 121: Z610-R604. Aortic aneurysm NOS (ICD10-I71.9) 7. Incidental findings, including: Hiatal hernia. Coronary artery atherosclerosis. Aortic Atherosclerosis (ICD10-I70.0). Prostatomegaly with bladder distension suggests a component of outlet obstruction. Sinus disease.

## 2022-10-24 ENCOUNTER — Encounter (HOSPITAL_COMMUNITY)
Admission: RE | Admit: 2022-10-24 | Discharge: 2022-10-24 | Disposition: A | Discharge status: Home / Self Care | Payer: Medicare Other | Source: Ambulatory Visit | Attending: Radiation Oncology | Admitting: Radiation Oncology

## 2022-10-24 DIAGNOSIS — C770 Secondary and unspecified malignant neoplasm of lymph nodes of head, face and neck: Secondary | ICD-10-CM | POA: Insufficient documentation

## 2022-10-24 LAB — GLUCOSE, CAPILLARY: Glucose-Capillary: 95 mg/dL (ref 70–99)

## 2022-10-24 MED ORDER — FLUDEOXYGLUCOSE F - 18 (FDG) INJECTION
7.8600 | Freq: Once | INTRAVENOUS | Status: AC | PRN
  Administered 2022-10-24: 7.86 via INTRAVENOUS

## 2022-10-25 ENCOUNTER — Ambulatory Visit: Payer: Medicare Other | Admitting: Radiation Oncology

## 2022-10-26 ENCOUNTER — Ambulatory Visit: Payer: Medicare Other | Admitting: Radiation Oncology

## 2022-10-26 ENCOUNTER — Encounter: Payer: Self-pay | Admitting: Radiation Oncology

## 2022-10-26 ENCOUNTER — Telehealth: Payer: Self-pay | Admitting: *Deleted

## 2022-10-26 ENCOUNTER — Ambulatory Visit
Admission: RE | Admit: 2022-10-26 | Discharge: 2022-10-26 | Disposition: A | Discharge status: Home / Self Care | Payer: Medicare Other | Source: Ambulatory Visit | Attending: Radiation Oncology | Admitting: Radiation Oncology

## 2022-10-26 VITALS — BP 159/72 | HR 58 | Temp 97.7°F | Resp 20 | Ht 70.0 in | Wt 155.6 lb

## 2022-10-26 DIAGNOSIS — J432 Centrilobular emphysema: Secondary | ICD-10-CM | POA: Insufficient documentation

## 2022-10-26 DIAGNOSIS — C001 Malignant neoplasm of external lower lip: Secondary | ICD-10-CM | POA: Diagnosis not present

## 2022-10-26 DIAGNOSIS — Z79899 Other long term (current) drug therapy: Secondary | ICD-10-CM | POA: Insufficient documentation

## 2022-10-26 DIAGNOSIS — Z923 Personal history of irradiation: Secondary | ICD-10-CM | POA: Diagnosis not present

## 2022-10-26 DIAGNOSIS — R338 Other retention of urine: Secondary | ICD-10-CM | POA: Insufficient documentation

## 2022-10-26 DIAGNOSIS — I517 Cardiomegaly: Secondary | ICD-10-CM | POA: Diagnosis not present

## 2022-10-26 DIAGNOSIS — K449 Diaphragmatic hernia without obstruction or gangrene: Secondary | ICD-10-CM | POA: Diagnosis not present

## 2022-10-26 DIAGNOSIS — R008 Other abnormalities of heart beat: Secondary | ICD-10-CM | POA: Insufficient documentation

## 2022-10-26 DIAGNOSIS — I890 Lymphedema, not elsewhere classified: Secondary | ICD-10-CM | POA: Diagnosis not present

## 2022-10-26 DIAGNOSIS — I6529 Occlusion and stenosis of unspecified carotid artery: Secondary | ICD-10-CM | POA: Insufficient documentation

## 2022-10-26 DIAGNOSIS — I2510 Atherosclerotic heart disease of native coronary artery without angina pectoris: Secondary | ICD-10-CM | POA: Diagnosis not present

## 2022-10-26 DIAGNOSIS — I471 Supraventricular tachycardia, unspecified: Secondary | ICD-10-CM | POA: Insufficient documentation

## 2022-10-26 DIAGNOSIS — I700 Atherosclerosis of aorta: Secondary | ICD-10-CM | POA: Diagnosis not present

## 2022-10-26 DIAGNOSIS — I472 Ventricular tachycardia, unspecified: Secondary | ICD-10-CM | POA: Insufficient documentation

## 2022-10-26 DIAGNOSIS — C770 Secondary and unspecified malignant neoplasm of lymph nodes of head, face and neck: Secondary | ICD-10-CM | POA: Diagnosis not present

## 2022-10-26 NOTE — Progress Notes (Signed)
Radiation Oncology         [phone] ________________________________  Name: Zachary Padilla MRN: [id number]  Date: 10/26/2022  DOB: 1928/12/14  Follow-Up Visit Note  CC: Emilio Aspen, MD  [name], *  Diagnosis and Prior Radiotherapy:       ICD-10-CM   1. 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.0 CT Soft Tissue Neck Wo Contrast    Ambulatory referral to Physical Therapy     First Treatment Date: 2022-06-27 - Last Treatment Date: 2022-07-18  Plan Name: HN_nodes Site: Neck Technique: IMRT Mode: Photon Dose Per Fraction: 3 Gy Prescribed Dose (Delivered / Prescribed): 48 Gy / 48 Gy Prescribed Fxs (Delivered / Prescribed): 16 / 16   First Treatment Date: 2022-03-14 - Last Treatment Date: 2022-04-12  Plan Name: HN_LowLip_BO Site: Lip, Lower Technique: Electron Mode: Electron Dose Per Fraction: 2.5 Gy Prescribed Dose (Delivered / Prescribed): 50 Gy / 50 Gy Prescribed Fxs (Delivered / Prescribed): 20 / 20    CHIEF COMPLAINT:  Here for follow-up and surveillance of lip/neck cancer  Narrative:  The patient returns today for routine follow-up and to review his most recent PET scan.  Mr. Padilla presents to clinic today for follow up for completion of radiation therapy to his neck . He completed treatment on the 07-18-22.   PET from 10/24/22 showed response to therapy of right level 1A nodal metastasis with decrease to the lower lip soft tissue thickening.  No evidence of metastatic disease was identified.   Pain issues, if any: none to report Using a feeding tube?: never had Weight changes, if any:     Wt Readings from Last 3 Encounters:  10/26/22 155 lb 9.6 oz (70.6 kg)  09/14/22 157 lb (71.2 kg)  08/05/22 161 lb 6.4 oz (73.2 kg)    Swallowing issues, if any: normal swallowing, dry mouth remains but improving Smoking or chewing tobacco? Does not use Using fluoride toothpaste daily? none Last ENT visit was on: none since finishing  treatment Other notable issues, if any: bottom lip is still numb per pt report                     ALLERGIES:  is allergic to ace inhibitors, aminoglycosides, erythromycin, iodinated contrast media, levofloxacin, penicillins, sulfa antibiotics, and ciprofloxacin.  Meds: Current Outpatient Medications  Medication Sig Dispense Refill   cholecalciferol (VITAMIN D3) 25 MCG (1000 UNIT) tablet Take 1,000 Units by mouth daily.     furosemide (LASIX) 20 MG tablet Take 20 mg by mouth daily.     levETIRAcetam (KEPPRA) 500 MG tablet TAKE ONE-HALF TABLET IN THE MORNING AND TAKE ONE TABLET IN THE EVENING 135 tablet 3   SIMPLY SALINE NA Place 1 spray into the nose 2 (two) times daily.     vitamin B-12 (CYANOCOBALAMIN) 50 MCG tablet Take 50 mcg by mouth daily.     No current facility-administered medications for this encounter.    Physical Findings:  The patient is in no acute distress. Patient is alert and oriented. Wt Readings from Last 3 Encounters:  10/26/22 155 lb 9.6 oz (70.6 kg)  09/14/22 157 lb (71.2 kg)  08/05/22 161 lb 6.4 oz (73.2 kg)    height is 5\' 10"  (1.778 m) and weight is 155 lb 9.6 oz (70.6 kg). His temperature is 97.7 F (36.5 C). His blood pressure is 159/72 (abnormal) and his pulse is 58 (abnormal). His respiration is 20 and oxygen saturation is 100%. Marland Kitchen  General: Alert and oriented, in no acute distress HEENT: Head is normocephalic. Extraocular movements are intact. No palpable sign of tumor recurrence throughout the inner and outer lip. No evidence of cancer recurrence in the lower lip or the within the oral cavity. Non-healing skin scabbing of the left nose.  Neck: Mild lymphedema in the anterior neck and submental region. Skin has healed very well from the radiation. Firmness in the submental region, but there is no obvious palpable adenopathy in that area. No palpable adenopathy throughout the rest of the neck.  Skin: Skin in treatment fields shows satisfactory healing    Lymphatics: see Neck Exam Psychiatric: Judgment and insight are intact. Affect is appropriate.   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 Radiation therapy in 2023. lymph node biopsy in January of 2024. EXAM: NUCLEAR MEDICINE PET SKULL BASE TO THIGH TECHNIQUE: 7.9 mCi F-18 FDG was injected intravenously. Full-ring PET imaging was performed from the skull base to thigh after the radiotracer. CT data was obtained and used for attenuation correction and anatomic localization. Fasting blood glucose: 95 mg/dl COMPARISON:  44/07/4740 FINDINGS: Mediastinal blood pool activity: SUV max 2.3 Liver activity: SUV max NA NECK: Mild hypermetabolism about the nares again identified. Example at a S.U.V. max of 6.1 today versus a S.U.V. max of 5.3 on the prior. The soft tissue thickening about the lower lip is decreased. Hypermetabolism at a S.U.V. max of 3.0 today versus similar on the prior. The right-sided level 1A node measures 1.6 cm and a S.U.V. max of 3.2 today versus 1.9 cm and a S.U.V. max of 15.7 on the prior. Incidental CT findings: Mucosal thickening of the sphenoid sinuses, ethmoid air cells, maxillary sinuses. Carotid atherosclerosis. CHEST: No pulmonary parenchymal or thoracic nodal hypermetabolism. Incidental CT findings: Aortic and coronary artery calcification. Mild cardiomegaly. Mild ascending aortic dilatation at 4.1 cm is similar. Small to moderate hiatal hernia. Trace left pleural fluid or thickening is unchanged. Centrilobular emphysema. ABDOMEN/PELVIS: Multifocal colonic hypermetabolism is most likely physiologic. The most significant  is in the splenic flexure region and without CT correlate at a S.U.V. max of 8.6. No abdominopelvic nodal hypermetabolism. Incidental CT findings: Normal adrenal glands. Bladder distension. Moderate prostatomegaly. SKELETON: No abnormal marrow activity. Incidental CT findings: None. IMPRESSION: 1. Response to therapy of right level 1A nodal metastasis. The lower lip soft tissue thickening has also decreased. 2. No hypermetabolic distant metastasis identified. 3. Similar nonspecific hypermetabolism about the nares. 4. Colonic hypermetabolism is most likely physiologic. Given focality within the hepatic flexure region, correlate with rectal bleeding or anemia. 5. Trace left pleural fluid or thickening, similar. 6. Similar mild ascending aortic dilatation at 4.1 cm. Recommend annual imaging followup by CTA or MRA. This recommendation follows 2010 ACCF/AHA/AATS/ACR/ASA/SCA/SCAI/SIR/STS/SVM Guidelines for the Diagnosis and Management of Patients with Thoracic Aortic Disease. Circulation. 2010; 121: V956-L875. Aortic aneurysm NOS (ICD10-I71.9) 7. Incidental findings, including: Hiatal hernia. Coronary artery atherosclerosis. Aortic Atherosclerosis (ICD10-I70.0). Prostatomegaly with bladder distension suggests a component of outlet obstruction. Sinus disease. Electronically Signed   By: Jeronimo Greaves M.D.   On: 10/24/2022 15:39   LONG  TERM MONITOR (3-14 DAYS)  Result Date: 10/23/2022 Patch Wear Time:  13 days and 23 hours (2024-05-24T11:04:01-399 to 2024-06-07T11:04:09-398) Patient had a min HR of 41 bpm, max HR of 174 bpm, and avg HR of 65 bpm. Predominant underlying rhythm was Sinus Rhythm. First Degree AV Block was present. Bundle Branch Block/IVCD was present. 2 Ventricular Tachycardia runs occurred, the run with the fastest interval lasting 5 beats with a max rate of 174 bpm (avg 139 bpm); the run with the fastest interval was also the longest. 1228 Supraventricular Tachycardia runs occurred, the run with the  fastest interval lasting 4 beats with a max rate of 169 bpm, the longest lasting 19.3 secs with an avg rate of 100 bpm. Isolated SVEs were rare (<1.0%), SVE Couplets were rare (<1.0%), and SVE Triplets were rare (<1.0%). Isolated VEs were frequent (10.8%, [id number]), VE Couplets were occasional (1.3%, 8629), and  VE Triplets were rare (<1.0%, 123). Ventricular Bigeminy and Trigeminy were present. SR/SB/ST Occasional PACs, Freq PVCs (10.8% burden) Short runs of SVT (1228) Short runs of NSVT    Impression/Plan:    1) Head and Neck Cancer Status: Healing well from radiation therapy to the upper neck. Recent PET is very favorable. We discussed his case at tumor board this morning and personally reviewed his images. We are very pleased with his response.   2) Nutritional Status: Eating well, weight is stable.  PEG tube: None Wt Readings from Last 3 Encounters:  10/26/22 155 lb 9.6 oz (70.6 kg)  09/14/22 157 lb (71.2 kg)  08/05/22 161 lb 6.4 oz (73.2 kg)   4) Swallowing: Good function   5) Lymphedema - referral made to PT today.   6) Dental: Encouraged to continue regular followup with dentistry, and dental hygiene including fluoride rinses.   7) Thyroid function: Unlikely to be affected by radiation therapy  Lab Results  Component Value Date   TSH 1.16 03/22/2016   8) Patient had a scab on the nose visible on physical exam today. He states this has been present for over a month. Our scheduling team is working to get him scheduled with dermatology. He knows to call and make an appointment if he doesn't hear anything soon.   9)  Patient will see Dr. Marene Lenz in 2 months for routine follow up. We will get a follow-up CT of his neck (wo contrast) in four months with a follow-up appointment with [place] to review the scan.  On date of service, in total, I spent 25 minutes on this encounter. Patient was seen in person. Note signed after encounter date; minutes pertain to date of service,  only.  _____________________________________   Joyice Faster, PA-C   Lonie Peak, MD

## 2022-10-26 NOTE — Telephone Encounter (Signed)
Called patient to inform of appt. with Dr. Billey Co (dermatologist) for 6-20- 24- arrival time- 12:55 pm- address- [address], [address] - phone number - [phone],informed patient to bring new insurance card if he has one, spoke with patient and he is aware of this appt. and is good with it

## 2022-10-26 NOTE — Progress Notes (Signed)
Oncology Nurse Navigator Documentation   Per patient's 10/26/22 post-treatment follow-up with Dr. Basilio Cairo, sent fax to [facility] ENT Scheduling with request Mr. Kibby be contacted and scheduled for routine post-RT follow-up with Dr. Marene Lenz in 2 months. Notification of successful fax transmission received.   Hedda Slade RN, BSN, OCN Head & Neck Oncology Nurse Navigator [facility] Phone # [phone]  Fax # [phone]

## 2022-10-27 ENCOUNTER — Encounter: Payer: Self-pay | Admitting: Cardiovascular Disease

## 2022-10-27 ENCOUNTER — Ambulatory Visit: Payer: Medicare Other | Attending: Cardiovascular Disease | Admitting: Cardiovascular Disease

## 2022-10-27 ENCOUNTER — Telehealth: Payer: Self-pay

## 2022-10-27 VITALS — BP 109/64 | HR 78 | Ht 70.0 in | Wt 156.0 lb

## 2022-10-27 DIAGNOSIS — R42 Dizziness and giddiness: Secondary | ICD-10-CM | POA: Diagnosis not present

## 2022-10-27 NOTE — Progress Notes (Signed)
Zachary Padilla returns today for follow-up of his 2-week Zio patch done in evaluation of dizziness.  He only had 1 episode of dizziness when working on his shades and looking up.  A rare phenomenon.  He wore a Zio patch for 2 weeks which resulted 6/16.  He had a PVC burden of 11%, short runs of SVT and NSVT although he does not feel these.  I do not think these need to be further addressed at this time.  I will see him back in 1 year for follow-up.  Zachary Padilla, M.D., FACP, [facility], [facility] [address] [place], [place]  [address]  [phone] 10/27/2022 1:59 PM

## 2022-10-27 NOTE — Telephone Encounter (Signed)
Pt called stating he had a hard time getting a hold of anyone at the office to change his appt time for today. Rn was able to call office and have them call him to reschedule his appointment. He stated he had schedule conflict for today at that time.

## 2022-10-27 NOTE — Patient Instructions (Signed)
Medication Instructions:  Your physician recommends that you continue on your current medications as directed. Please refer to the Current Medication list given to you today.  *If you need a refill on your cardiac medications before your next appointment, please call your pharmacy*   Lab Work: NONE If you have labs (blood work) drawn today and your tests are completely normal, you will receive your results only by: MyChart Message (if you have MyChart) OR A paper copy in the mail If you have any lab test that is abnormal or we need to change your treatment, we will call you to review the results.   Testing/Procedures: NONE   Follow-Up: At [facility], you and your health needs are our priority.  As part of our continuing mission to provide you with exceptional heart care, we have created designated Provider Care Teams.  These Care Teams include your primary Cardiologist (physician) and Advanced Practice Providers (APPs -  Physician Assistants and Nurse Practitioners) who all work together to provide you with the care you need, when you need it.  We recommend signing up for the patient portal called "MyChart".  Sign up information is provided on this After Visit Summary.  MyChart is used to connect with patients for Virtual Visits (Telemedicine).  Patients are able to view lab/test results, encounter notes, upcoming appointments, etc.  Non-urgent messages can be sent to your provider as well.   To learn more about what you can do with MyChart, go to [email or web address].    Your next appointment:   1 year(s)  Provider:   Jonathan Berry, MD    

## 2022-10-28 ENCOUNTER — Encounter: Payer: Self-pay | Admitting: Radiation Oncology

## 2022-10-28 ENCOUNTER — Ambulatory Visit: Payer: Medicare Other | Admitting: Radiation Oncology

## 2022-11-03 ENCOUNTER — Ambulatory Visit: Payer: Medicare Other | Admitting: Podiatry

## 2022-11-14 ENCOUNTER — Encounter: Payer: Self-pay | Admitting: Podiatry

## 2022-11-14 ENCOUNTER — Ambulatory Visit (INDEPENDENT_AMBULATORY_CARE_PROVIDER_SITE_OTHER): Payer: Medicare Other | Admitting: Podiatry

## 2022-11-14 DIAGNOSIS — B351 Tinea unguium: Secondary | ICD-10-CM | POA: Diagnosis not present

## 2022-11-14 DIAGNOSIS — M79675 Pain in left toe(s): Secondary | ICD-10-CM

## 2022-11-14 DIAGNOSIS — M79674 Pain in right toe(s): Secondary | ICD-10-CM

## 2022-11-14 NOTE — Progress Notes (Signed)
Subjective:   Patient ID: Zachary Padilla, male   DOB: 89 y.o.   MRN: [id number]   HPI Patient presents with elongated nailbeds 1-5 both feet that are thick yellow brittle and painful   ROS      Objective:  Physical Exam  Neurovascular status intact thick yellow brittle nailbeds 1-5 both feet painful     Assessment:  Chronic mycotic nail infection 1-5 both feet     Plan:  Debrided painful nailbeds 1-5 both feet nitrogen and bleeding reappoint routine care

## 2022-11-14 NOTE — Therapy (Unsigned)
.  OPRCHN

## 2022-11-15 ENCOUNTER — Other Ambulatory Visit: Payer: Self-pay

## 2022-11-15 ENCOUNTER — Ambulatory Visit: Payer: Medicare Other | Attending: Radiology | Admitting: Physical Therapy

## 2022-11-15 ENCOUNTER — Encounter: Payer: Self-pay | Admitting: Physical Therapy

## 2022-11-15 DIAGNOSIS — R293 Abnormal posture: Secondary | ICD-10-CM | POA: Insufficient documentation

## 2022-11-15 DIAGNOSIS — I890 Lymphedema, not elsewhere classified: Secondary | ICD-10-CM | POA: Insufficient documentation

## 2022-11-15 DIAGNOSIS — C770 Secondary and unspecified malignant neoplasm of lymph nodes of head, face and neck: Secondary | ICD-10-CM | POA: Diagnosis present

## 2022-11-15 NOTE — Therapy (Signed)
OUTPATIENT PHYSICAL THERAPY HEAD AND NECK BASELINE EVALUATION   Patient Name: Zachary Padilla MRN: [id number] DOB:21-Feb-1929, 89 y.o., male Today's Date: 11/15/2022  END OF SESSION:  PT End of Session - 11/15/22 1309     Visit Number 1    Number of Visits 9    Date for PT Re-Evaluation 12/13/22    PT Start Time 1127   pt arrived late   PT Stop Time 1155    PT Time Calculation (min) 28 min    Activity Tolerance Patient tolerated treatment well    Behavior During Therapy [facility] for tasks assessed/performed             Past Medical History:  Diagnosis Date   ACNE ROSACEA 09/09/2009   ALLERGIC RHINITIS 02/23/2009   Basal cell carcinoma 06/23/2010   R cheek- (CX35FU+ exc )   COLONIC POLYPS, HX OF 02/23/2009   Convulsions/seizures (HCC) 10/24/2014   DIVERTICULITIS, HX OF 02/23/2009   Ear infection    Family history of heart disease    GERD 02/23/2009   Hyperlipidemia    HYPERTENSION 02/23/2009   PONV (postoperative nausea and vomiting)    Posterior vitreous detachment of right eye 08/26/2019   Pre-syncope    Thoracic aortic aneurysm (HCC)    URINARY INCONTINENCE 02/23/2009   Past Surgical History:  Procedure Laterality Date   APPENDECTOMY  1946   CATARACT EXTRACTION Bilateral    EYE SURGERY Right 07/25/2019   Left ear tube  2001   Removal of pre-cancer bump on neck  2005   Ruptured appendics  1946   Sinus surgery  2008   Patient Active Problem List   Diagnosis Date Noted   Dizziness 09/14/2022   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   Preoperative clearance 06/08/2022   Pain due to onychomycosis of toenails of both feet 03/08/2022   Cancer of lower lip, 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   Vitreous floater, right 11/18/2021   Macular hole of right eye 09/29/2021   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 09/29/2021   Moderate mitral regurgitation 07/06/2021   Lower extremity edema 07/06/2021   Cystoid  macular edema of left eye 04/20/2021   Macular pucker, left eye 04/14/2020   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   Sleep apnea, obstructive 12/02/2019   Status post eye surgery 09/04/2019   Superficial keratitis with conjunctivitis, right 08/29/2019   Right epiretinal membrane 08/26/2019   Retinal telangiectasia of right eye 08/26/2019   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   Chronic otitis media 08/25/2015   Sensorineural hearing loss 08/25/2015   Convulsions/seizures (HCC) 10/24/2014   Hyperlipidemia 02/13/2013   Thoracic aortic aneurysm (HCC) 02/13/2013   Right bundle branch block 02/13/2013   Nonspecific abnormal electrocardiogram (ECG) (EKG) 01/02/2012   ACNE ROSACEA 09/09/2009   Essential hypertension 02/23/2009   ALLERGIC RHINITIS 02/23/2009   GERD 02/23/2009   URINARY INCONTINENCE 02/23/2009   COLONIC POLYPS, HX OF 02/23/2009   DIVERTICULITIS, HX OF 02/23/2009    PCP: Eleanora Neighbor, MD  REFERRING PROVIDER: Erven Colla, PA-C   REFERRING DIAG: C77.0 (ICD-10-CM) - 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 It  drained for a long time. After the radiation it initially looked good. The swelling started in March or April.   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 He was also diagnosed with cancer of lower lip and completed radiation for that on 04/12/22. Hx of thoracic aortic aneurysm  PATIENT GOALS:   to be educated about the signs and symptoms of lymphedema and learn post op HEP.   PAIN:  Are you having pain? No  PRECAUTIONS: Comment (neck  lymphedema)  WEIGHT BEARING RESTRICTIONS: No  FALLS:  Has patient fallen in last 6 months? No Does the patient have a fear of falling that limits activity? No Is the patient reluctant to leave the house due to a fear of falling?No  LIVING ENVIRONMENT: Patient lives with: wife Lives in: House/apartment Has following equipment at home: None  OCCUPATION: retired  LEISURE: pedals on foot bike, does arm exercises daily  PRIOR LEVEL OF FUNCTION: Independent   OBJECTIVE:  COGNITION: Overall cognitive status: Within functional limits for tasks assessed                  POSTURE:  Forward head and rounded shoulders posture  SHOULDER AROM:   WFL   CERVICAL AROM:   Percent limited  Flexion WFL sometimes with dizziness  Extension 50% limited dizziness  Right lateral flexion 25% limited  Left lateral flexion 25% limited  Right rotation WFL  Left rotation WFL    (Blank rows=not tested)  LYMPHEDEMA ASSESSMENT:    Circumference in cm  4 cm superior to sternal notch around neck 41.2  6 cm superior to sternal notch around neck 42  8 cm superior to sternal notch around neck 43.4  R lateral nostril from base of nose to medial tragus   L lateral nostril from base of nose to medial tragus   R corner of mouth to where ear lobe meets face 9.2  L corner of mouth to where ear lobe meets face 9.5        (Blank rows=not tested)   PATIENT EDUCATION:  Education details: anatomy and physiology of the lymphatic system, need for MLD and compression, how to obtain Tribute head and neck garment Person educated: Patient Education method: Explanation, Demonstration, Handout Education comprehension: Patient verbalized understanding   HOME EXERCISE PROGRAM: None yet   ASSESSMENT:  CLINICAL IMPRESSION: Pt arrives to PT with neck and mandibular lymphedema. He completed radiation on 07/18/22 for secondary malignant neoplasm of lymph nodes of neck. He had prior radiation for squamous cell  carcinoma of lip and completed on 04/12/22. Educated pt on anatomy and physiology of lymphatic system and how edema can place him at an increased risk of infection. Pt would benefit from skilled PT services to decrease neck and mandibular lymphedema and instruct pt on independent management.   Pt will benefit from skilled therapeutic intervention to improve on the following deficits: decreased knowledge of condition, decreased knowledge of use of DME, decreased ROM, increased edema, increased fascial restrictions, and postural dysfunction  PT treatment/interventions: ADL/self-care home management, pt/family education, therapeutic exercise. Other interventions Therapeutic exercises, Therapeutic activity, Patient/Family education, Self Care, Orthotic/Fit training, Manual lymph drainage, Compression bandaging, Taping, Vasopneumatic device, Manual therapy, and Re-evaluation  REHAB POTENTIAL: Good  CLINICAL DECISION MAKING: Stable/uncomplicated  EVALUATION COMPLEXITY: Low   GOALS: Goals reviewed with patient? YES  LONG TERM GOALS: (STG=LTG)   Name Target Date  Goal status  1 Pt will be independent  in self MLD for long term management of lymphedema.   12/13/2022 NEW  2 Patient will obtain a compression garment for long term management of lymphedema.   12/13/2022 NEW  3 Pt will demonstrate a 0.5 cm decrease in edema from R lateral edge of lip to where earlobe meets face to decrease risk of infection.  12/13/2022 NEW    PLAN:  PT FREQUENCY/DURATION: 2x/wk for 4 wks   PLAN FOR NEXT SESSION: begin MLD using Norton anterior approach, instruct pt eventually, did he order a garment?    [facility], PT 11/15/2022, 1:14 PM

## 2022-11-23 LAB — LAB REPORT - SCANNED
Creatinine, POC: 73.1 mg/dL
eGFR: 40

## 2022-12-06 ENCOUNTER — Ambulatory Visit: Payer: Medicare Other | Admitting: Rehabilitation

## 2022-12-06 ENCOUNTER — Encounter: Payer: Self-pay | Admitting: Rehabilitation

## 2022-12-06 DIAGNOSIS — C770 Secondary and unspecified malignant neoplasm of lymph nodes of head, face and neck: Secondary | ICD-10-CM

## 2022-12-06 DIAGNOSIS — I890 Lymphedema, not elsewhere classified: Secondary | ICD-10-CM

## 2022-12-06 DIAGNOSIS — R293 Abnormal posture: Secondary | ICD-10-CM

## 2022-12-06 NOTE — Therapy (Signed)
OUTPATIENT PHYSICAL THERAPY HEAD AND NECK TREATMENT   Patient Name: Zachary Padilla MRN: [id number] DOB:October 27, 1928, 89 y.o., male Today's Date: 12/06/2022  END OF SESSION:  PT End of Session - 12/06/22 1443     Visit Number 2    Number of Visits 9    Date for PT Re-Evaluation 12/13/22    PT Start Time 1115    PT Stop Time 1156    PT Time Calculation (min) 41 min    Activity Tolerance Patient tolerated treatment well    Behavior During Therapy [facility] for tasks assessed/performed              Past Medical History:  Diagnosis Date   ACNE ROSACEA 09/09/2009   ALLERGIC RHINITIS 02/23/2009   Basal cell carcinoma 06/23/2010   R cheek- (CX35FU+ exc )   COLONIC POLYPS, HX OF 02/23/2009   Convulsions/seizures (HCC) 10/24/2014   DIVERTICULITIS, HX OF 02/23/2009   Ear infection    Family history of heart disease    GERD 02/23/2009   Hyperlipidemia    HYPERTENSION 02/23/2009   PONV (postoperative nausea and vomiting)    Posterior vitreous detachment of right eye 08/26/2019   Pre-syncope    Thoracic aortic aneurysm (HCC)    URINARY INCONTINENCE 02/23/2009   Past Surgical History:  Procedure Laterality Date   APPENDECTOMY  1946   CATARACT EXTRACTION Bilateral    EYE SURGERY Right 07/25/2019   Left ear tube  2001   Removal of pre-cancer bump on neck  2005   Ruptured appendics  1946   Sinus surgery  2008   Patient Active Problem List   Diagnosis Date Noted   Dizziness 09/14/2022   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   Preoperative clearance 06/08/2022   Pain due to onychomycosis of toenails of both feet 03/08/2022   Cancer of lower lip, 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   Vitreous floater, right 11/18/2021   Macular hole of right eye 09/29/2021   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 09/29/2021   Moderate mitral regurgitation 07/06/2021   Lower extremity edema 07/06/2021   Cystoid macular edema of left  eye 04/20/2021   Macular pucker, left eye 04/14/2020   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   Sleep apnea, obstructive 12/02/2019   Status post eye surgery 09/04/2019   Superficial keratitis with conjunctivitis, right 08/29/2019   Right epiretinal membrane 08/26/2019   Retinal telangiectasia of right eye 08/26/2019   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   Chronic otitis media 08/25/2015   Sensorineural hearing loss 08/25/2015   Convulsions/seizures (HCC) 10/24/2014   Hyperlipidemia 02/13/2013   Thoracic aortic aneurysm (HCC) 02/13/2013   Right bundle branch block 02/13/2013   Nonspecific abnormal electrocardiogram (ECG) (EKG) 01/02/2012   ACNE ROSACEA 09/09/2009   Essential hypertension 02/23/2009   ALLERGIC RHINITIS 02/23/2009   GERD 02/23/2009   URINARY INCONTINENCE 02/23/2009   COLONIC POLYPS, HX OF 02/23/2009   DIVERTICULITIS, HX OF 02/23/2009    PCP: Eleanora Neighbor, MD  REFERRING PROVIDER: Erven Colla, PA-C   REFERRING DIAG: C77.0 (ICD-10-CM) - 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  I ordered that thing but don't have  it yet.   EVAL: They did a biopsy on my throat but it would not heal up. It drained for a long time. After the radiation it initially looked good. The swelling started in March or April.   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 He was also diagnosed with cancer of lower lip and completed radiation for that on 04/12/22. Hx of thoracic aortic aneurysm  PATIENT GOALS:   to be educated about the signs and symptoms of lymphedema and learn post  op HEP.   PAIN:  Are you having pain? No  PRECAUTIONS: Comment (neck lymphedema)  WEIGHT BEARING RESTRICTIONS: No  FALLS:  Has patient fallen in last 6 months? No Does the patient have a fear of falling that limits activity? No Is the patient reluctant to leave the house due to a fear of falling?No  LIVING ENVIRONMENT: Patient lives with: wife Lives in: House/apartment Has following equipment at home: None  OCCUPATION: retired  LEISURE: pedals on foot bike, does arm exercises daily  PRIOR LEVEL OF FUNCTION: Independent   OBJECTIVE:  COGNITION: Overall cognitive status: Within functional limits for tasks assessed                  POSTURE:  Forward head and rounded shoulders posture  SHOULDER AROM:   WFL   CERVICAL AROM:   Percent limited  Flexion WFL sometimes with dizziness  Extension 50% limited dizziness  Right lateral flexion 25% limited  Left lateral flexion 25% limited  Right rotation WFL  Left rotation WFL    (Blank rows=not tested)  LYMPHEDEMA ASSESSMENT:    Circumference in cm  4 cm superior to sternal notch around neck 41.2  6 cm superior to sternal notch around neck 42  8 cm superior to sternal notch around neck 43.4  R lateral nostril from base of nose to medial tragus   L lateral nostril from base of nose to medial tragus   R corner of mouth to where ear lobe meets face 9.2  L corner of mouth to where ear lobe meets face 9.5        (Blank rows=not tested)  TODAY"S TREATMENT 12/06/22 PT performed in supine HOB elevated with education throughout but no instruction yet: using Norton anterior approach handout as follows: short neck, 5 diaphragmatic breaths, bilateral axillary nodes, bilateral pectoral nodes, anterior chest, short neck, posterior neck moving fluid towards pathway aimed at lateral neck, then lateral and anterior neck moving fluid towards pathway aimed at lateral neck then retracing all steps    PATIENT EDUCATION:  Education  details: anatomy and physiology of the lymphatic system, need for MLD and compression, how to obtain Tribute head and neck garment Person educated: Patient Education method: Explanation, Demonstration, Handout Education comprehension: Patient verbalized understanding   HOME EXERCISE PROGRAM: None yet   ASSESSMENT:  CLINICAL IMPRESSION: Pt returns after eval session and traveling and is now back in town.  He notes that his neck seems better with time passing.  We started MLD with education on the principles of this.  He may need basic instruction.  He also noted that he has his garment on order.    Pt will benefit from skilled therapeutic intervention to improve on the following deficits: decreased knowledge of condition, decreased knowledge of use of DME, decreased ROM, increased edema, increased fascial restrictions, and postural dysfunction  PT treatment/interventions: ADL/self-care home management, pt/family education, therapeutic exercise. Other interventions  Therapeutic exercises, Therapeutic activity, Patient/Family education, Self Care, Orthotic/Fit training, Manual lymph drainage, Compression bandaging, Taping, Vasopneumatic device, Manual therapy, and Re-evaluation  REHAB POTENTIAL: Good  CLINICAL DECISION MAKING: Stable/uncomplicated  EVALUATION COMPLEXITY: Low   GOALS: Goals reviewed with patient? YES  LONG TERM GOALS: (STG=LTG)   Name Target Date  Goal status  1 Pt will be independent in self MLD for long term management of lymphedema.   12/13/2022 NEW  2 Patient will obtain a compression garment for long term management of lymphedema.   12/13/2022 NEW  3 Pt will demonstrate a 0.5 cm decrease in edema from R lateral edge of lip to where earlobe meets face to decrease risk of infection.  12/13/2022 NEW    PLAN:  PT FREQUENCY/DURATION: 2x/wk for 4 wks   PLAN FOR NEXT SESSION: begin MLD using Norton anterior approach, instruct pt eventually    Idamae Lusher,  PT 12/06/2022, 2:44 PM

## 2022-12-09 ENCOUNTER — Ambulatory Visit: Payer: Medicare Other | Admitting: Physical Therapy

## 2022-12-13 ENCOUNTER — Ambulatory Visit (INDEPENDENT_AMBULATORY_CARE_PROVIDER_SITE_OTHER): Payer: Medicare Other | Admitting: Adult Health

## 2022-12-13 ENCOUNTER — Ambulatory Visit: Payer: Medicare Other | Attending: Radiology | Admitting: Rehabilitation

## 2022-12-13 ENCOUNTER — Encounter: Payer: Self-pay | Admitting: Rehabilitation

## 2022-12-13 ENCOUNTER — Encounter: Payer: Self-pay | Admitting: Adult Health

## 2022-12-13 VITALS — BP 138/68 | HR 57 | Ht 70.0 in | Wt 153.0 lb

## 2022-12-13 DIAGNOSIS — I890 Lymphedema, not elsewhere classified: Secondary | ICD-10-CM | POA: Insufficient documentation

## 2022-12-13 DIAGNOSIS — C770 Secondary and unspecified malignant neoplasm of lymph nodes of head, face and neck: Secondary | ICD-10-CM | POA: Insufficient documentation

## 2022-12-13 DIAGNOSIS — R569 Unspecified convulsions: Secondary | ICD-10-CM

## 2022-12-13 DIAGNOSIS — R293 Abnormal posture: Secondary | ICD-10-CM | POA: Insufficient documentation

## 2022-12-13 MED ORDER — LEVETIRACETAM 500 MG PO TABS: ORAL_TABLET | ORAL | 3 refills | Status: DC

## 2022-12-13 NOTE — Patient Instructions (Addendum)
Self Massage   Wear your compression at least 30 minutes, then remove it and do the massage  1.) 10 Circles at each collarbone  2.) 5 Deep breaths  3.) 10 circles in each armpit  4.) Stretch or Milk down the front of your neck where it is swollen for a few minutes  Then put your compression garment back on for at least 2-3 more hours

## 2022-12-13 NOTE — Patient Instructions (Signed)
Your Plan:  Continue Keppra     Thank you for coming to see [place] at [facility]. I hope we have been able to provide you high quality care today.  You may receive a patient satisfaction survey over the next few weeks. We would appreciate your feedback and comments so that we may continue to improve ourselves and the health of our patients.

## 2022-12-13 NOTE — Therapy (Signed)
OUTPATIENT PHYSICAL THERAPY HEAD AND NECK TREATMENT   Patient Name: Katie Schowalter MRN: [id number] DOB:1929/02/24, 89 y.o., male Today's Date: 12/13/2022  END OF SESSION:  PT End of Session - 12/13/22 1453     Visit Number 3    Number of Visits 9    Date for PT Re-Evaluation 12/13/22    PT Start Time 1410    PT Stop Time 1453    PT Time Calculation (min) 43 min    Activity Tolerance Patient tolerated treatment well    Behavior During Therapy [facility] for tasks assessed/performed               Past Medical History:  Diagnosis Date   ACNE ROSACEA 09/09/2009   ALLERGIC RHINITIS 02/23/2009   Basal cell carcinoma 06/23/2010   R cheek- (CX35FU+ exc )   COLONIC POLYPS, HX OF 02/23/2009   Convulsions/seizures (HCC) 10/24/2014   DIVERTICULITIS, HX OF 02/23/2009   Ear infection    Family history of heart disease    GERD 02/23/2009   Hyperlipidemia    HYPERTENSION 02/23/2009   PONV (postoperative nausea and vomiting)    Posterior vitreous detachment of right eye 08/26/2019   Pre-syncope    Thoracic aortic aneurysm (HCC)    URINARY INCONTINENCE 02/23/2009   Past Surgical History:  Procedure Laterality Date   APPENDECTOMY  1946   CATARACT EXTRACTION Bilateral    EYE SURGERY Right 07/25/2019   Left ear tube  2001   Removal of pre-cancer bump on neck  2005   Ruptured appendics  1946   Sinus surgery  2008   Patient Active Problem List   Diagnosis Date Noted   Dizziness 09/14/2022   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   Preoperative clearance 06/08/2022   Pain due to onychomycosis of toenails of both feet 03/08/2022   Cancer of lower lip, 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   Vitreous floater, right 11/18/2021   Macular hole of right eye 09/29/2021   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 09/29/2021   Moderate mitral regurgitation 07/06/2021   Lower extremity edema 07/06/2021   Cystoid macular edema of left  eye 04/20/2021   Macular pucker, left eye 04/14/2020   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   Sleep apnea, obstructive 12/02/2019   Status post eye surgery 09/04/2019   Superficial keratitis with conjunctivitis, right 08/29/2019   Right epiretinal membrane 08/26/2019   Retinal telangiectasia of right eye 08/26/2019   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   Chronic otitis media 08/25/2015   Sensorineural hearing loss 08/25/2015   Convulsions/seizures (HCC) 10/24/2014   Hyperlipidemia 02/13/2013   Thoracic aortic aneurysm (HCC) 02/13/2013   Right bundle branch block 02/13/2013   Nonspecific abnormal electrocardiogram (ECG) (EKG) 01/02/2012   ACNE ROSACEA 09/09/2009   Essential hypertension 02/23/2009   ALLERGIC RHINITIS 02/23/2009   GERD 02/23/2009   URINARY INCONTINENCE 02/23/2009   COLONIC POLYPS, HX OF 02/23/2009   DIVERTICULITIS, HX OF 02/23/2009    PCP: Eleanora Neighbor, MD  REFERRING PROVIDER: Erven Colla, PA-C   REFERRING DIAG: C77.0 (ICD-10-CM) - 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 It drained for a long time. After the radiation it initially looked good. The swelling started in March or April.   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 He was also diagnosed with cancer of lower lip and completed radiation for that on 04/12/22. Hx of thoracic aortic aneurysm  PATIENT GOALS:   to be educated about the signs and symptoms of lymphedema and learn post op HEP.   PAIN:  Are you having pain?  No  PRECAUTIONS: Comment (neck lymphedema)  WEIGHT BEARING RESTRICTIONS: No  FALLS:  Has patient fallen in last 6 months? No Does the patient have a fear of falling that limits activity? No Is the patient reluctant to leave the house due to a fear of falling?No  LIVING ENVIRONMENT: Patient lives with: wife Lives in: House/apartment Has following equipment at home: None  OCCUPATION: retired  LEISURE: pedals on foot bike, does arm exercises daily  PRIOR LEVEL OF FUNCTION: Independent   OBJECTIVE:  COGNITION: Overall cognitive status: Within functional limits for tasks assessed                  POSTURE:  Forward head and rounded shoulders posture  SHOULDER AROM:   WFL   CERVICAL AROM:   Percent limited  Flexion WFL sometimes with dizziness  Extension 50% limited dizziness  Right lateral flexion 25% limited  Left lateral flexion 25% limited  Right rotation WFL  Left rotation WFL    (Blank rows=not tested)  LYMPHEDEMA ASSESSMENT:    Circumference in cm  4 cm superior to sternal notch around neck 41.2  6 cm superior to sternal notch around neck 42  8 cm superior to sternal notch around neck 43.4  R lateral nostril from base of nose to medial tragus   L lateral nostril from base of nose to medial tragus   R corner of mouth to where ear lobe meets face 9.2  L corner of mouth to where ear lobe meets face 9.5        (Blank rows=not tested)  TODAY"S TREATMENT 12/13/22 PT performed in supine HOB elevated with education throughout PT performed using Norton anterior approach but educated pt modified to minimize steps per below: PT performed as follows: short neck, 5 diaphragmatic breaths, bilateral axillary nodes, bilateral pectoral nodes, anterior chest, short neck, posterior neck moving fluid towards pathway aimed at lateral neck, then lateral and anterior neck moving fluid towards pathway aimed at lateral neck then retracing all steps. Then pt performed steps with  instruction in seated.   12/06/22 PT performed in supine HOB elevated with education throughout but no instruction yet: using Norton anterior approach handout as follows: short neck, 5 diaphragmatic breaths, bilateral axillary nodes, bilateral pectoral nodes, anterior chest, short neck, posterior neck moving fluid towards pathway aimed at lateral neck, then lateral and anterior neck moving fluid towards pathway aimed at lateral neck then retracing all steps    PATIENT EDUCATION:  Education details: anatomy and physiology of the lymphatic system, need for MLD and compression, how to obtain Tribute head and neck garment, self MLD Person educated: Patient Education method: Programmer, multimedia, Facilities manager, Handout Education comprehension: Patient verbalized understanding   HOME EXERCISE PROGRAM: None yet, self MLD Self Massage   Wear your compression at least 30 minutes, then remove it and do the massage  1.) 10 Circles at each collarbone  2.) 5 Deep breaths  3.) 10 circles in each armpit  4.) Stretch or Milk down the front of your neck where it is swollen for a few minutes  Then put your compression garment back on for at least 2-3 more hours     ASSESSMENT:  CLINICAL IMPRESSION: Pt had a MOHs procedure on his nose and still has a bandage in place so his face was avoided in MLD.  Continued MLD with education on steps of this with focus on basic instruction.  He also noted that he has his garment on order and he may have it next visit.   Pt will benefit from skilled therapeutic intervention to improve on the following deficits: decreased knowledge of condition, decreased knowledge of use of DME, decreased ROM, increased edema, increased fascial restrictions, and postural dysfunction  PT treatment/interventions: ADL/self-care home management, pt/family education, therapeutic exercise. Other interventions Therapeutic exercises, Therapeutic activity, Patient/Family education, Self Care, Orthotic/Fit  training, Manual lymph drainage, Compression bandaging, Taping, Vasopneumatic device, Manual therapy, and Re-evaluation  REHAB POTENTIAL: Good  CLINICAL DECISION MAKING: Stable/uncomplicated  EVALUATION COMPLEXITY: Low   GOALS: Goals reviewed with patient? YES  LONG TERM GOALS: (STG=LTG)   Name Target Date  Goal status  1 Pt will be independent in self MLD for long term management of lymphedema.   12/13/2022 NEW  2 Patient will obtain a compression garment for long term management of lymphedema.   12/13/2022 NEW  3 Pt will demonstrate a 0.5 cm decrease in edema from R lateral edge of lip to where earlobe meets face to decrease risk of infection.  12/13/2022 NEW    PLAN:  PT FREQUENCY/DURATION: 2x/wk for 4 wks   PLAN FOR NEXT SESSION: begin MLD using Norton anterior approach, instruct pt eventually    Idamae Lusher, PT 12/13/2022, 2:53 PM

## 2022-12-13 NOTE — Progress Notes (Signed)
PATIENT: Zachary Padilla DOB: 04/11/1929  REASON FOR VISIT: follow up HISTORY FROM: patient  Chief Complaint  Patient presents with   Follow-up    RM 5 with spouse [name]  Pt is well and stable, no convulsions or new concerns since last visit.      HISTORY OF PRESENT ILLNESS: Today 12/13/22:  Zachary Padilla is a 89 y.o. male with a history of seizures. Returns today for follow-up. Remains on Keppra 250 mg in the AM and 500 mg in the PM. No seizures. Overall doing well. Spoke about weaning off Keppra but unsure about stopping driving at this time.   09/20/21: Zachary Padilla is a 89 year old male with a history of seizures. He returns today. Continues on Keppra 250 mg in the AM and 500 mg at PM. Denies any seizures events. Stage 3 CKD followed by nephrology. No new symptoms  02/19/21:Zachary Padilla is a 89 y.o. male with a history of seizures, he is here today for a follow up. He denies and seizure events. He denies excess fatigue or drowsiness. Denies any falls or changes in his memory. He continues to operate a motor vehicle and he is living at home with wife.   He denies any issues or concerns at this time. He is having issues with his kidneys and reports he is stage 3 CKD. He is having difficulties finding foods that are appropriate for this. His concern is if the Keppra effects his kidneys. We informed that patient that this medication is ok at the current dose. He is seeing a urologist.   HISTORY  02/19/20: Zachary Padilla is an 89 year old male with a history of seizures.  He returns today for follow-up.  He denies any seizure events.  Continues on Keppra 250 mg in the morning and 500 in the evening.  Does report that he has stage III kidney disease.  Overall he feels that he is doing well.  Returns today for evaluation.   08/20/19: Zachary Padilla is an 89 year old male with a history of seizures.  He returns today for follow-up.  He denies any seizure events.  Continues on Keppra 250 mg in the morning  and 500 mg in the evening.  He denies any further changes with his memory.  He is able to complete all ADLs independently.  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  He returns today for an evaluation.    REVIEW OF SYSTEMS: Out of a complete 14 system review of symptoms, the patient complains only of the following symptoms, and all other reviewed systems are negative.  ALLERGIES: Allergies  Allergen Reactions   Ace Inhibitors Other (See Comments)    unknown   Aminoglycosides     Unknown reaction   Erythromycin Nausea And Vomiting   Iodinated Contrast Media     Cannot Have do to decreased kidney function.    Levofloxacin Other (See Comments)    dehydration   Penicillins Nausea And Vomiting   Sulfa Antibiotics     Unknown reaction    Ciprofloxacin Hives and Rash    hives    HOME MEDICATIONS: Outpatient Medications Prior to Visit  Medication Sig Dispense Refill   cholecalciferol (VITAMIN D3) 25 MCG (1000 UNIT) tablet Take 1,000 Units by mouth daily.     furosemide (LASIX) 20 MG tablet Take 20 mg by mouth daily.     levETIRAcetam (KEPPRA) 500 MG tablet TAKE ONE-HALF TABLET IN THE MORNING AND TAKE ONE TABLET IN THE EVENING 135 tablet 3   SIMPLY  SALINE NA Place 1 spray into the nose 2 (two) times daily.     vitamin B-12 (CYANOCOBALAMIN) 50 MCG tablet Take 50 mcg by mouth daily.     No facility-administered medications prior to visit.    PAST MEDICAL HISTORY: Past Medical History:  Diagnosis Date   ACNE ROSACEA 09/09/2009   ALLERGIC RHINITIS 02/23/2009   Basal cell carcinoma 06/23/2010   R cheek- (CX35FU+ exc )   COLONIC POLYPS, HX OF 02/23/2009   Convulsions/seizures (HCC) 10/24/2014   DIVERTICULITIS, HX OF 02/23/2009   Ear infection    Family history of heart disease    GERD 02/23/2009   Hyperlipidemia    HYPERTENSION 02/23/2009   PONV (postoperative nausea and vomiting)    Posterior vitreous detachment of right eye 08/26/2019   Pre-syncope    Thoracic aortic aneurysm (HCC)     URINARY INCONTINENCE 02/23/2009    PAST SURGICAL HISTORY: Past Surgical History:  Procedure Laterality Date   APPENDECTOMY  1946   CATARACT EXTRACTION Bilateral    EYE SURGERY Right 07/25/2019   Left ear tube  2001   Removal of pre-cancer bump on neck  2005   Ruptured appendics  1946   Sinus surgery  2008    FAMILY HISTORY: Family History  Problem Relation Age of Onset   Heart disease Mother    Stroke Father    Heart disease Father    Cancer Sister    Cancer Brother    Heart disease Other    Stroke Other    Seizures Neg Hx     SOCIAL HISTORY: Social History   Socioeconomic History   Marital status: Married    Spouse name: Not on file   Number of children: 2   Years of education: 15   Highest education level: Not on file  Occupational History   Occupation: retired  Tobacco Use   Smoking status: Former   Smokeless tobacco: Never   Tobacco comments:    Lives with spouse-independant in all ADLs. [employer] and [employer] at [employer], Married in "52"  Vaping Use   Vaping status: Never Used  Substance and Sexual Activity   Alcohol use: No   Drug use: No   Sexual activity: Yes  Other Topics Concern   Not on file  Social History Narrative   Patient occasionally drinks tea.   Patient is right handed.   Social Determinants of Health   Financial Resource Strain: Not on file  Food Insecurity: Not on file  Transportation Needs: Not on file  Physical Activity: Not on file  Stress: Not on file  Social Connections: Not on file  Intimate Partner Violence: Not on file     PHYSICAL EXAM  Vitals:   12/13/22 1124  Weight: 153 lb (69.4 kg)  Height: 5\' 10"  (1.778 m)   Body mass index is 21.95 kg/m.  Generalized: Well developed, in no acute distress   Neurological examination  Mentation: Alert oriented to time, place, history taking. Follows all commands speech and language fluent Cranial nerve II-XII: Pupils were equal round reactive to  light. Extraocular movements were full, visual field were full on confrontational test. Facial sensation and strength were normal.  Head turning and shoulder shrug  were normal and symmetric. Motor: The motor testing reveals 5 over 5 strength of all 4 extremities. Good symmetric motor tone is noted throughout.  Sensory: Sensory testing is intact to soft touch on all 4 extremities. No evidence of extinction is noted.  Coordination: Cerebellar testing  reveals good finger-nose-finger and heel-to-shin bilaterally.  Gait and station: Gait is normal.   DIAGNOSTIC DATA (LABS, IMAGING, TESTING) - I reviewed patient records, labs, notes, testing and imaging myself where available.  Lab Results  Component Value Date   WBC 5.5 03/22/2016   HGB 14.3 03/22/2016   HCT 41.9 03/22/2016   MCV 96.5 03/22/2016   PLT 160 03/22/2016      Component Value Date/Time   NA 134 (L) 03/22/2016 1128   K 4.7 03/22/2016 1128   CL 101 03/22/2016 1128   CO2 27 03/22/2016 1128   GLUCOSE 80 03/22/2016 1128   BUN 17 03/22/2016 1128   CREATININE 1.27 (H) 03/22/2016 1128   CALCIUM 8.7 03/22/2016 1128   PROT 6.6 03/22/2016 1128   ALBUMIN 3.9 03/22/2016 1128   AST 22 03/22/2016 1128   ALT 15 03/22/2016 1128   ALKPHOS 73 03/22/2016 1128   BILITOT 1.0 03/22/2016 1128   GFRNONAA 51 (L) 03/22/2016 1128   GFRAA 59 (L) 03/22/2016 1128   Lab Results  Component Value Date   CHOL 123 03/22/2016   HDL 50 03/22/2016   LDLCALC 63 03/22/2016   TRIG 52 03/22/2016   CHOLHDL 2.5 03/22/2016   No results found for: "HGBA1C" No results found for: "VITAMINB12" Lab Results  Component Value Date   TSH 1.16 03/22/2016     ASSESSMENT AND PLAN 89 y.o. year old male  has a past medical history of ACNE ROSACEA (09/09/2009), ALLERGIC RHINITIS (02/23/2009), Basal cell carcinoma (06/23/2010), COLONIC POLYPS, HX OF (02/23/2009), Convulsions/seizures (HCC) (10/24/2014), DIVERTICULITIS, HX OF (02/23/2009), Ear infection, Family history  of heart disease, GERD (02/23/2009), Hyperlipidemia, HYPERTENSION (02/23/2009), PONV (postoperative nausea and vomiting), Posterior vitreous detachment of right eye (08/26/2019), Pre-syncope, Thoracic aortic aneurysm (HCC), and URINARY INCONTINENCE (02/23/2009). here with   Convulsions; unspecified type  -- Continue Keppra 250 mg in the morning and 500 mg in the evening. -- Advised to call if he has worsening or new symptoms -- FU in 1 year or sooner if needed    Butch Penny, MSN, NP-C 12/13/2022, 11:27 AM [facility] [address], [address] [place], [place] [address] [phone]

## 2022-12-15 ENCOUNTER — Encounter: Payer: Self-pay | Admitting: Physical Therapy

## 2022-12-15 ENCOUNTER — Ambulatory Visit: Payer: Medicare Other | Admitting: Physical Therapy

## 2022-12-15 DIAGNOSIS — R293 Abnormal posture: Secondary | ICD-10-CM

## 2022-12-15 DIAGNOSIS — C770 Secondary and unspecified malignant neoplasm of lymph nodes of head, face and neck: Secondary | ICD-10-CM

## 2022-12-15 DIAGNOSIS — I890 Lymphedema, not elsewhere classified: Secondary | ICD-10-CM

## 2022-12-15 NOTE — Therapy (Addendum)
OUTPATIENT PHYSICAL THERAPY HEAD AND NECK TREATMENT   Patient Name: Zachary Padilla MRN: [id number] DOB:07-09-1928, 89 y.o., male Today's Date: 12/15/2022  END OF SESSION:  PT End of Session - 12/15/22 1019     Visit Number 4    Number of Visits 12   Date for PT Re-Evaluation 01/12/23    PT Start Time 1019   pt arrived late   PT Stop Time 1100    PT Time Calculation (min) 41 min    Activity Tolerance Patient tolerated treatment well    Behavior During Therapy [facility] for tasks assessed/performed               Past Medical History:  Diagnosis Date   ACNE ROSACEA 09/09/2009   ALLERGIC RHINITIS 02/23/2009   Basal cell carcinoma 06/23/2010   R cheek- (CX35FU+ exc )   COLONIC POLYPS, HX OF 02/23/2009   Convulsions/seizures (HCC) 10/24/2014   DIVERTICULITIS, HX OF 02/23/2009   Ear infection    Family history of heart disease    GERD 02/23/2009   Hyperlipidemia    HYPERTENSION 02/23/2009   PONV (postoperative nausea and vomiting)    Posterior vitreous detachment of right eye 08/26/2019   Pre-syncope    Thoracic aortic aneurysm (HCC)    URINARY INCONTINENCE 02/23/2009   Past Surgical History:  Procedure Laterality Date   APPENDECTOMY  1946   CATARACT EXTRACTION Bilateral    EYE SURGERY Right 07/25/2019   Left ear tube  2001   Removal of pre-cancer bump on neck  2005   Ruptured appendics  1946   Sinus surgery  2008   Patient Active Problem List   Diagnosis Date Noted   Dizziness 09/14/2022   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   Preoperative clearance 06/08/2022   Pain due to onychomycosis of toenails of both feet 03/08/2022   Cancer of lower lip, 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   Vitreous floater, right 11/18/2021   Macular hole of right eye 09/29/2021   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 09/29/2021   Moderate mitral regurgitation 07/06/2021   Lower extremity edema 07/06/2021   Cystoid  macular edema of left eye 04/20/2021   Macular pucker, left eye 04/14/2020   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   Sleep apnea, obstructive 12/02/2019   Status post eye surgery 09/04/2019   Superficial keratitis with conjunctivitis, right 08/29/2019   Right epiretinal membrane 08/26/2019   Retinal telangiectasia of right eye 08/26/2019   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   Chronic otitis media 08/25/2015   Sensorineural hearing loss 08/25/2015   Convulsions/seizures (HCC) 10/24/2014   Hyperlipidemia 02/13/2013   Thoracic aortic aneurysm (HCC) 02/13/2013   Right bundle branch block 02/13/2013   Nonspecific abnormal electrocardiogram (ECG) (EKG) 01/02/2012   ACNE ROSACEA 09/09/2009   Essential hypertension 02/23/2009   ALLERGIC RHINITIS 02/23/2009   GERD 02/23/2009   URINARY INCONTINENCE 02/23/2009   COLONIC POLYPS, HX OF 02/23/2009   DIVERTICULITIS, HX OF 02/23/2009    PCP: Eleanora Neighbor, MD  REFERRING PROVIDER: Erven Colla, PA-C   REFERRING DIAG: C77.0 (ICD-10-CM) - 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 I have not opened it.   EVAL: They did  a biopsy on my throat but it would not heal up. It drained for a long time. After the radiation it initially looked good. The swelling started in March or April.   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 He was also diagnosed with cancer of lower lip and completed radiation for that on 04/12/22. Hx of thoracic aortic aneurysm  PATIENT GOALS:   to be educated about the signs and symptoms of lymphedema and learn post op HEP.   PAIN:  Are you  having pain? No  PRECAUTIONS: Comment (neck lymphedema)  WEIGHT BEARING RESTRICTIONS: No  FALLS:  Has patient fallen in last 6 months? No Does the patient have a fear of falling that limits activity? No Is the patient reluctant to leave the house due to a fear of falling?No  LIVING ENVIRONMENT: Patient lives with: wife Lives in: House/apartment Has following equipment at home: None  OCCUPATION: retired  LEISURE: pedals on foot bike, does arm exercises daily  PRIOR LEVEL OF FUNCTION: Independent   OBJECTIVE:  COGNITION: Overall cognitive status: Within functional limits for tasks assessed                  POSTURE:  Forward head and rounded shoulders posture  SHOULDER AROM:   WFL   CERVICAL AROM:   Percent limited  Flexion WFL sometimes with dizziness  Extension 50% limited dizziness  Right lateral flexion 25% limited  Left lateral flexion 25% limited  Right rotation WFL  Left rotation WFL    (Blank rows=not tested)  LYMPHEDEMA ASSESSMENT:    Circumference in cm  4 cm superior to sternal notch around neck 41.2  6 cm superior to sternal notch around neck 42  8 cm superior to sternal notch around neck 43.4  R lateral nostril from base of nose to medial tragus   L lateral nostril from base of nose to medial tragus   R corner of mouth to where ear lobe meets face 9.2  L corner of mouth to where ear lobe meets face 9.5        (Blank rows=not tested)  TODAY"S TREATMENT 12/15/22: Educated pt in proper donning/doffing of his head and neck garment. Pt had difficulty donning with both straps un done so attached top of head strap at appropriate point and had pt practice donning with that strap already fastened. After several attempts pt was able to don his garment by only taking off the strap behind the neck. Pt reports his garment was comfortable. Educated him to wear it a minimum of 4 hours a day.  Educated pt in abbreviated version of self MLD as follows seated in  chair in front of mirror: short neck, 5 deep breaths, anterior neck and cheeks stretching skin downwards. Spent extra time educating pt in proper skin stretch technique and not sliding hand on skin.   12/13/22 PT performed in supine HOB elevated with education throughout PT performed using Norton anterior approach but educated pt modified to minimize steps per below: PT performed as follows: short neck, 5 diaphragmatic breaths, bilateral axillary nodes, bilateral pectoral nodes, anterior chest, short neck, posterior neck moving fluid towards pathway aimed at lateral neck, then lateral and anterior neck moving fluid towards pathway aimed at lateral neck then retracing all steps. Then pt performed steps with instruction in seated.   12/06/22 PT performed in supine HOB elevated with education throughout but no instruction yet: using Norton anterior approach  handout as follows: short neck, 5 diaphragmatic breaths, bilateral axillary nodes, bilateral pectoral nodes, anterior chest, short neck, posterior neck moving fluid towards pathway aimed at lateral neck, then lateral and anterior neck moving fluid towards pathway aimed at lateral neck then retracing all steps    PATIENT EDUCATION:  Education details: anatomy and physiology of the lymphatic system, need for MLD and compression, how to obtain Tribute head and neck garment, self MLD Person educated: Patient Education method: Programmer, multimedia, Facilities manager, Handout Education comprehension: Patient verbalized understanding   HOME EXERCISE PROGRAM: None yet, self MLD Self Massage   Wear your compression at least 30 minutes, then remove it and do the massage  1.) 10 Circles at each collarbone  2.) 5 Deep breaths  3.) 10 circles in each armpit  4.) Stretch or Milk down the front of your neck where it is swollen for a few minutes  Then put your compression garment back on for at least 2-3 more hours     ASSESSMENT:  CLINICAL IMPRESSION: Pt received his  head and neck garment. Spent session having pt practice donning/doffing until he was independent. Continued to educate pt on abbreviated MLD version for anterior neck and cheeks with most of session spent on instructing pt in correct skin stretch technique. Pt was able to demonstrate correct skin stretch with min to mod cueing by end of session. Pt would benefit from additional skilled PT services to continue to decrease anterior neck lymphedema and progress pt towards independent management.   Pt will benefit from skilled therapeutic intervention to improve on the following deficits: decreased knowledge of condition, decreased knowledge of use of DME, decreased ROM, increased edema, increased fascial restrictions, and postural dysfunction  PT treatment/interventions: ADL/self-care home management, pt/family education, therapeutic exercise. Other interventions Therapeutic exercises, Therapeutic activity, Patient/Family education, Self Care, Orthotic/Fit training, Manual lymph drainage, Compression bandaging, Taping, Vasopneumatic device, Manual therapy, and Re-evaluation  REHAB POTENTIAL: Good  CLINICAL DECISION MAKING: Stable/uncomplicated  EVALUATION COMPLEXITY: Low   GOALS: Goals reviewed with patient? YES  LONG TERM GOALS: (STG=LTG)   Name Target Date  Goal status  1 Pt will be independent in self MLD for long term management of lymphedema.   12/13/2022 12/15/22 IN PROGRESS  2 Patient will obtain a compression garment for long term management of lymphedema.   12/13/2022 12/15/22- MET 12/15/22  3 Pt will demonstrate a 0.5 cm decrease in edema from R lateral edge of lip to where earlobe meets face to decrease risk of infection.  12/13/2022 12/15/22 IN PROGRESS    PLAN:  PT FREQUENCY/DURATION: 2x/wk for 4 wks   PLAN FOR NEXT SESSION: begin MLD using Norton anterior approach, instruct pt eventually and can issue [employer]	[place], how is he doing with his garment?    [facility] [place],  PT 12/15/2022, 11:14 AM

## 2022-12-20 ENCOUNTER — Encounter: Payer: Self-pay | Admitting: Physical Therapy

## 2022-12-20 ENCOUNTER — Ambulatory Visit: Payer: Medicare Other | Admitting: Physical Therapy

## 2022-12-20 DIAGNOSIS — I890 Lymphedema, not elsewhere classified: Secondary | ICD-10-CM | POA: Diagnosis not present

## 2022-12-20 DIAGNOSIS — R293 Abnormal posture: Secondary | ICD-10-CM

## 2022-12-20 DIAGNOSIS — C770 Secondary and unspecified malignant neoplasm of lymph nodes of head, face and neck: Secondary | ICD-10-CM

## 2022-12-20 NOTE — Therapy (Signed)
OUTPATIENT PHYSICAL THERAPY HEAD AND NECK TREATMENT   Patient Name: Zachary Padilla MRN: [id number] DOB:1929/04/29, 89 y.o., male Today's Date: 12/20/2022  END OF SESSION:  PT End of Session - 12/20/22 1111     Visit Number 5    Number of Visits 12    Date for PT Re-Evaluation 01/12/23    PT Start Time 1107    PT Stop Time 1156    PT Time Calculation (min) 49 min    Activity Tolerance Patient tolerated treatment well    Behavior During Therapy [facility] for tasks assessed/performed               Past Medical History:  Diagnosis Date   ACNE ROSACEA 09/09/2009   ALLERGIC RHINITIS 02/23/2009   Basal cell carcinoma 06/23/2010   R cheek- (CX35FU+ exc )   COLONIC POLYPS, HX OF 02/23/2009   Convulsions/seizures (HCC) 10/24/2014   DIVERTICULITIS, HX OF 02/23/2009   Ear infection    Family history of heart disease    GERD 02/23/2009   Hyperlipidemia    HYPERTENSION 02/23/2009   PONV (postoperative nausea and vomiting)    Posterior vitreous detachment of right eye 08/26/2019   Pre-syncope    Thoracic aortic aneurysm (HCC)    URINARY INCONTINENCE 02/23/2009   Past Surgical History:  Procedure Laterality Date   APPENDECTOMY  1946   CATARACT EXTRACTION Bilateral    EYE SURGERY Right 07/25/2019   Left ear tube  2001   Removal of pre-cancer bump on neck  2005   Ruptured appendics  1946   Sinus surgery  2008   Patient Active Problem List   Diagnosis Date Noted   Dizziness 09/14/2022   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   Preoperative clearance 06/08/2022   Pain due to onychomycosis of toenails of both feet 03/08/2022   Cancer of lower lip, 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   Vitreous floater, right 11/18/2021   Macular hole of right eye 09/29/2021   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 09/29/2021   Moderate mitral regurgitation 07/06/2021   Lower extremity edema 07/06/2021   Cystoid macular edema of left  eye 04/20/2021   Macular pucker, left eye 04/14/2020   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   Sleep apnea, obstructive 12/02/2019   Status post eye surgery 09/04/2019   Superficial keratitis with conjunctivitis, right 08/29/2019   Right epiretinal membrane 08/26/2019   Retinal telangiectasia of right eye 08/26/2019   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   Chronic otitis media 08/25/2015   Sensorineural hearing loss 08/25/2015   Convulsions/seizures (HCC) 10/24/2014   Hyperlipidemia 02/13/2013   Thoracic aortic aneurysm (HCC) 02/13/2013   Right bundle branch block 02/13/2013   Nonspecific abnormal electrocardiogram (ECG) (EKG) 01/02/2012   ACNE ROSACEA 09/09/2009   Essential hypertension 02/23/2009   ALLERGIC RHINITIS 02/23/2009   GERD 02/23/2009   URINARY INCONTINENCE 02/23/2009   COLONIC POLYPS, HX OF 02/23/2009   DIVERTICULITIS, HX OF 02/23/2009    PCP: Eleanora Neighbor, MD  REFERRING PROVIDER: Erven Colla, PA-C   REFERRING DIAG: C77.0 (ICD-10-CM) - 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   EVAL: They did a biopsy on  my throat but it would not heal up. It drained for a long time. After the radiation it initially looked good. The swelling started in March or April.   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 He was also diagnosed with cancer of lower lip and completed radiation for that on 04/12/22. Hx of thoracic aortic aneurysm  PATIENT GOALS:   to be educated about the signs and symptoms of lymphedema and learn post op HEP.   PAIN:  Are you having pain?  No  PRECAUTIONS: Comment (neck lymphedema)  WEIGHT BEARING RESTRICTIONS: No  FALLS:  Has patient fallen in last 6 months? No Does the patient have a fear of falling that limits activity? No Is the patient reluctant to leave the house due to a fear of falling?No  LIVING ENVIRONMENT: Patient lives with: wife Lives in: House/apartment Has following equipment at home: None  OCCUPATION: retired  LEISURE: pedals on foot bike, does arm exercises daily  PRIOR LEVEL OF FUNCTION: Independent   OBJECTIVE:  COGNITION: Overall cognitive status: Within functional limits for tasks assessed                  POSTURE:  Forward head and rounded shoulders posture  SHOULDER AROM:   WFL   CERVICAL AROM:   Percent limited  Flexion WFL sometimes with dizziness  Extension 50% limited dizziness  Right lateral flexion 25% limited  Left lateral flexion 25% limited  Right rotation WFL  Left rotation WFL    (Blank rows=not tested)  LYMPHEDEMA ASSESSMENT:    Circumference in cm 12/20/22  4 cm superior to sternal notch around neck 41.2 40.4  6 cm superior to sternal notch around neck 42 40.6  8 cm superior to sternal notch around neck 43.4 41.8  R lateral nostril from base of nose to medial tragus    L lateral nostril from base of nose to medial tragus    R corner of mouth to where ear lobe meets face 9.2 9.1  L corner of mouth to where ear lobe meets face 9.5 9.5          (Blank rows=not tested)  TODAY"S TREATMENT 12/20/22: Remeasured circumferences. Educated pt in [place] Anterior approach handout and issued handout to pt to follow. Went through steps 1-8 having pt return demonstrate each step while therapist provided verbal and tactile cues for proper skin stretch and sequence. Pt went through entire proximal drainage technique and by end of session demonstrated improved skn stretch technique.   12/15/22: Educated pt in proper donning/doffing of his head and neck garment. Pt had  difficulty donning with both straps un done so attached top of head strap at appropriate point and had pt practice donning with that strap already fastened. After several attempts pt was able to don his garment by only taking off the strap behind the neck. Pt reports his garment was comfortable. Educated him to wear it a minimum of 4 hours a day.  Educated pt in abbreviated version of self MLD as follows seated in chair in front of mirror: short neck, 5 deep breaths, anterior neck and cheeks stretching skin downwards. Spent extra time educating pt in proper skin stretch technique and not sliding hand on skin.   12/13/22 PT performed in supine HOB elevated with education throughout PT performed using [place] anterior approach but educated pt modified to minimize steps per below: PT performed as follows: short neck, 5 diaphragmatic  breaths, bilateral axillary nodes, bilateral pectoral nodes, anterior chest, short neck, posterior neck moving fluid towards pathway aimed at lateral neck, then lateral and anterior neck moving fluid towards pathway aimed at lateral neck then retracing all steps. Then pt performed steps with instruction in seated.   12/06/22 PT performed in supine HOB elevated with education throughout but no instruction yet: using [place] anterior approach handout as follows: short neck, 5 diaphragmatic breaths, bilateral axillary nodes, bilateral pectoral nodes, anterior chest, short neck, posterior neck moving fluid towards pathway aimed at lateral neck, then lateral and anterior neck moving fluid towards pathway aimed at lateral neck then retracing all steps    PATIENT EDUCATION:  Education details: anatomy and physiology of the lymphatic system, need for MLD and compression, how to obtain Tribute head and neck garment, self MLD Person educated: Patient Education method: Programmer, multimedia, Facilities manager, Handout Education comprehension: Patient verbalized understanding   HOME EXERCISE  PROGRAM: None yet, self MLD Self Massage   Wear your compression at least 30 minutes, then remove it and do the massage  1.) 10 Circles at each collarbone  2.) 5 Deep breaths  3.) 10 circles in each armpit  4.) Stretch or Milk down the front of your neck where it is swollen for a few minutes  Then put your compression garment back on for at least 2-3 more hours     ASSESSMENT:  CLINICAL IMPRESSION: Pt demonstrates excellent reduction of anterior neck lymphedema per circumferential reductions. Began instructing pt in [place] anterior approach technique and having him return demo each step while providing verbal and tactile cues for each step especially skin stretch technique. Will go through remainder of sequence at next session.   Pt will benefit from skilled therapeutic intervention to improve on the following deficits: decreased knowledge of condition, decreased knowledge of use of DME, decreased ROM, increased edema, increased fascial restrictions, and postural dysfunction  PT treatment/interventions: ADL/self-care home management, pt/family education, therapeutic exercise. Other interventions Therapeutic exercises, Therapeutic activity, Patient/Family education, Self Care, Orthotic/Fit training, Manual lymph drainage, Compression bandaging, Taping, Vasopneumatic device, Manual therapy, and Re-evaluation  REHAB POTENTIAL: Good  CLINICAL DECISION MAKING: Stable/uncomplicated  EVALUATION COMPLEXITY: Low   GOALS: Goals reviewed with patient? YES  LONG TERM GOALS: (STG=LTG)   Name Target Date  Goal status  1 Pt will be independent in self MLD for long term management of lymphedema.   12/13/2022 ONGOING 12/15/22  2 Patient will obtain a compression garment for long term management of lymphedema.   12/13/2022 MET 12/15/22 has tribute head and neck garment  3 Pt will demonstrate a 0.5 cm decrease in edema from R lateral edge of lip to where earlobe meets face to decrease risk of  infection.  12/13/2022 NEW    PLAN:  PT FREQUENCY/DURATION: 2x/wk for 4 wks   PLAN FOR NEXT SESSION: continue MLD using [place] anterior approach, instruct pt eventually and can issue [employer]	[place], how is he doing with his garment?    [facility] [place], PT 12/20/2022, 12:03 PM

## 2022-12-20 NOTE — Addendum Note (Signed)
Addended by: Leonette Most L on: 12/20/2022 12:08 PM   Modules accepted: Orders

## 2022-12-22 ENCOUNTER — Ambulatory Visit: Payer: Medicare Other | Admitting: Physical Therapy

## 2022-12-22 ENCOUNTER — Encounter: Payer: Self-pay | Admitting: Physical Therapy

## 2022-12-22 DIAGNOSIS — I890 Lymphedema, not elsewhere classified: Secondary | ICD-10-CM

## 2022-12-22 DIAGNOSIS — R293 Abnormal posture: Secondary | ICD-10-CM

## 2022-12-22 DIAGNOSIS — C770 Secondary and unspecified malignant neoplasm of lymph nodes of head, face and neck: Secondary | ICD-10-CM

## 2022-12-22 NOTE — Therapy (Signed)
OUTPATIENT PHYSICAL THERAPY HEAD AND NECK TREATMENT   Patient Name: Zachary Padilla MRN: [id number] DOB:05-12-1928, 89 y.o., male Today's Date: 12/22/2022  END OF SESSION:  PT End of Session - 12/22/22 1310     Visit Number 6    Number of Visits 12    Date for PT Re-Evaluation 01/12/23    PT Start Time 1307    PT Stop Time 1355    PT Time Calculation (min) 48 min    Activity Tolerance Patient tolerated treatment well    Behavior During Therapy [facility] for tasks assessed/performed               Past Medical History:  Diagnosis Date   ACNE ROSACEA 09/09/2009   ALLERGIC RHINITIS 02/23/2009   Basal cell carcinoma 06/23/2010   R cheek- (CX35FU+ exc )   COLONIC POLYPS, HX OF 02/23/2009   Convulsions/seizures (HCC) 10/24/2014   DIVERTICULITIS, HX OF 02/23/2009   Ear infection    Family history of heart disease    GERD 02/23/2009   Hyperlipidemia    HYPERTENSION 02/23/2009   PONV (postoperative nausea and vomiting)    Posterior vitreous detachment of right eye 08/26/2019   Pre-syncope    Thoracic aortic aneurysm (HCC)    URINARY INCONTINENCE 02/23/2009   Past Surgical History:  Procedure Laterality Date   APPENDECTOMY  1946   CATARACT EXTRACTION Bilateral    EYE SURGERY Right 07/25/2019   Left ear tube  2001   Removal of pre-cancer bump on neck  2005   Ruptured appendics  1946   Sinus surgery  2008   Patient Active Problem List   Diagnosis Date Noted   Dizziness 09/14/2022   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   Preoperative clearance 06/08/2022   Pain due to onychomycosis of toenails of both feet 03/08/2022   Cancer of lower lip, 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   Vitreous floater, right 11/18/2021   Macular hole of right eye 09/29/2021   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 09/29/2021   Moderate mitral regurgitation 07/06/2021   Lower extremity edema 07/06/2021   Cystoid macular edema of left  eye 04/20/2021   Macular pucker, left eye 04/14/2020   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   Sleep apnea, obstructive 12/02/2019   Status post eye surgery 09/04/2019   Superficial keratitis with conjunctivitis, right 08/29/2019   Right epiretinal membrane 08/26/2019   Retinal telangiectasia of right eye 08/26/2019   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   Chronic otitis media 08/25/2015   Sensorineural hearing loss 08/25/2015   Convulsions/seizures (HCC) 10/24/2014   Hyperlipidemia 02/13/2013   Thoracic aortic aneurysm (HCC) 02/13/2013   Right bundle branch block 02/13/2013   Nonspecific abnormal electrocardiogram (ECG) (EKG) 01/02/2012   ACNE ROSACEA 09/09/2009   Essential hypertension 02/23/2009   ALLERGIC RHINITIS 02/23/2009   GERD 02/23/2009   URINARY INCONTINENCE 02/23/2009   COLONIC POLYPS, HX OF 02/23/2009   DIVERTICULITIS, HX OF 02/23/2009    PCP: Eleanora Neighbor, MD  REFERRING PROVIDER: Erven Colla, PA-C   REFERRING DIAG: C77.0 (ICD-10-CM) - 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 I am having a hard time with  the massage.   EVAL: They did a biopsy on my throat but it would not heal up. It drained for a long time. After the radiation it initially looked good. The swelling started in March or April.   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 He was also diagnosed with cancer of lower lip and completed radiation for that on 04/12/22. Hx of thoracic aortic aneurysm  PATIENT GOALS:   to be educated about the signs and symptoms of lymphedema and learn post op  HEP.   PAIN:  Are you having pain? No  PRECAUTIONS: Comment (neck lymphedema)  WEIGHT BEARING RESTRICTIONS: No  FALLS:  Has patient fallen in last 6 months? No Does the patient have a fear of falling that limits activity? No Is the patient reluctant to leave the house due to a fear of falling?No  LIVING ENVIRONMENT: Patient lives with: wife Lives in: House/apartment Has following equipment at home: None  OCCUPATION: retired  LEISURE: pedals on foot bike, does arm exercises daily  PRIOR LEVEL OF FUNCTION: Independent   OBJECTIVE:  COGNITION: Overall cognitive status: Within functional limits for tasks assessed                  POSTURE:  Forward head and rounded shoulders posture  SHOULDER AROM:   WFL   CERVICAL AROM:   Percent limited  Flexion WFL sometimes with dizziness  Extension 50% limited dizziness  Right lateral flexion 25% limited  Left lateral flexion 25% limited  Right rotation WFL  Left rotation WFL    (Blank rows=not tested)  LYMPHEDEMA ASSESSMENT:    Circumference in cm 12/20/22  4 cm superior to sternal notch around neck 41.2 40.4  6 cm superior to sternal notch around neck 42 40.6  8 cm superior to sternal notch around neck 43.4 41.8  R lateral nostril from base of nose to medial tragus    L lateral nostril from base of nose to medial tragus    R corner of mouth to where ear lobe meets face 9.2 9.1  L corner of mouth to where ear lobe meets face 9.5 9.5          (Blank rows=not tested)  TODAY"S TREATMENT 12/22/22: Continued to educate pt in [place] Anterior approach  Went through steps 1-9 having pt return demonstrate each step while therapist provided verbal and tactile cues for proper skin stretch and sequence. Pt having difficulty with skin stretch technique so changed axillary and clavicular areas to circles to simplify. Pt demonstrates good technique at anterior chest but has increased difficulty at neck. Provided hand over hand  demonstration. Pt went through entire proximal drainage technique and by end of session demonstrated improved skn stretch technique.   12/20/22: Remeasured circumferences. Educated pt in [place] Anterior approach handout and issued handout to pt to follow. Went through steps 1-8 having pt return demonstrate each step while therapist provided verbal and tactile cues for proper skin stretch and sequence. Pt went through entire proximal drainage technique and by end of session demonstrated improved skn stretch technique.   12/15/22: Educated pt in proper donning/doffing of his head and neck garment. Pt had difficulty donning with both straps un done so attached top of head strap at appropriate point and had pt practice donning with that strap already fastened. After several attempts pt was able to don his garment by only taking off the strap behind the neck. Pt reports  his garment was comfortable. Educated him to wear it a minimum of 4 hours a day.  Educated pt in abbreviated version of self MLD as follows seated in chair in front of mirror: short neck, 5 deep breaths, anterior neck and cheeks stretching skin downwards. Spent extra time educating pt in proper skin stretch technique and not sliding hand on skin.   12/13/22 PT performed in supine HOB elevated with education throughout PT performed using [place] anterior approach but educated pt modified to minimize steps per below: PT performed as follows: short neck, 5 diaphragmatic breaths, bilateral axillary nodes, bilateral pectoral nodes, anterior chest, short neck, posterior neck moving fluid towards pathway aimed at lateral neck, then lateral and anterior neck moving fluid towards pathway aimed at lateral neck then retracing all steps. Then pt performed steps with instruction in seated.   12/06/22 PT performed in supine HOB elevated with education throughout but no instruction yet: using [place] anterior approach handout as follows: short neck, 5  diaphragmatic breaths, bilateral axillary nodes, bilateral pectoral nodes, anterior chest, short neck, posterior neck moving fluid towards pathway aimed at lateral neck, then lateral and anterior neck moving fluid towards pathway aimed at lateral neck then retracing all steps    PATIENT EDUCATION:  Education details: anatomy and physiology of the lymphatic system, need for MLD and compression, how to obtain Tribute head and neck garment, self MLD Person educated: Patient Education method: Programmer, multimedia, Facilities manager, Handout Education comprehension: Patient verbalized understanding   HOME EXERCISE PROGRAM: None yet, self MLD Self Massage   Wear your compression at least 30 minutes, then remove it and do the massage  1.) 10 Circles at each collarbone  2.) 5 Deep breaths  3.) 10 circles in each armpit  4.) Stretch or Milk down the front of your neck where it is swollen for a few minutes  Then put your compression garment back on for at least 2-3 more hours     ASSESSMENT:  CLINICAL IMPRESSION: Pt eager to learn proper technique and sequence and very willing to try and practice to improve technique. He has difficulty with proper skin stretch and tends to slide. He is improving his technique with instruction. Will continue to teach entire sequence at next session.   Pt will benefit from skilled therapeutic intervention to improve on the following deficits: decreased knowledge of condition, decreased knowledge of use of DME, decreased ROM, increased edema, increased fascial restrictions, and postural dysfunction  PT treatment/interventions: ADL/self-care home management, pt/family education, therapeutic exercise. Other interventions Therapeutic exercises, Therapeutic activity, Patient/Family education, Self Care, Orthotic/Fit training, Manual lymph drainage, Compression bandaging, Taping, Vasopneumatic device, Manual therapy, and Re-evaluation  REHAB POTENTIAL: Good  CLINICAL DECISION  MAKING: Stable/uncomplicated  EVALUATION COMPLEXITY: Low   GOALS: Goals reviewed with patient? YES  LONG TERM GOALS: (STG=LTG)   Name Target Date  Goal status  1 Pt will be independent in self MLD for long term management of lymphedema.   12/13/2022 ONGOING 12/15/22  2 Patient will obtain a compression garment for long term management of lymphedema.   12/13/2022 MET 12/15/22 has tribute head and neck garment  3 Pt will demonstrate a 0.5 cm decrease in edema from R lateral edge of lip to where earlobe meets face to decrease risk of infection.  12/13/2022 NEW    PLAN:  PT FREQUENCY/DURATION: 2x/wk for 4 wks   PLAN FOR NEXT SESSION: continue MLD using [place] anterior approach, instruct pt eventually and can issue [employer]	[place], how is he doing with his  garment?    [facility] [place], PT 12/22/2022, 3:16 PM

## 2022-12-26 ENCOUNTER — Encounter: Payer: Self-pay | Admitting: Physical Therapy

## 2022-12-26 ENCOUNTER — Ambulatory Visit: Payer: Medicare Other | Admitting: Physical Therapy

## 2022-12-26 DIAGNOSIS — I890 Lymphedema, not elsewhere classified: Secondary | ICD-10-CM | POA: Diagnosis not present

## 2022-12-26 DIAGNOSIS — C770 Secondary and unspecified malignant neoplasm of lymph nodes of head, face and neck: Secondary | ICD-10-CM

## 2022-12-26 DIAGNOSIS — R293 Abnormal posture: Secondary | ICD-10-CM

## 2022-12-26 NOTE — Therapy (Signed)
OUTPATIENT PHYSICAL THERAPY HEAD AND NECK TREATMENT   Patient Name: Zachary Padilla MRN: [id number] DOB:Apr 26, 1929, 89 y.o., male Today's Date: 12/26/2022  END OF SESSION:  PT End of Session - 12/26/22 1009     Visit Number 7    Number of Visits 12    Date for PT Re-Evaluation 01/12/23    PT Start Time 1007    PT Stop Time 1058    PT Time Calculation (min) 51 min    Activity Tolerance Patient tolerated treatment well    Behavior During Therapy WFL for tasks assessed/performed                Past Medical History:  Diagnosis Date   ACNE ROSACEA 09/09/2009   ALLERGIC RHINITIS 02/23/2009   Basal cell carcinoma 06/23/2010   R cheek- (CX35FU+ exc )   COLONIC POLYPS, HX OF 02/23/2009   Convulsions/seizures (HCC) 10/24/2014   DIVERTICULITIS, HX OF 02/23/2009   Ear infection    Family history of heart disease    GERD 02/23/2009   Hyperlipidemia    HYPERTENSION 02/23/2009   PONV (postoperative nausea and vomiting)    Posterior vitreous detachment of right eye 08/26/2019   Pre-syncope    Thoracic aortic aneurysm (HCC)    URINARY INCONTINENCE 02/23/2009   Past Surgical History:  Procedure Laterality Date   APPENDECTOMY  1946   CATARACT EXTRACTION Bilateral    EYE SURGERY Right 07/25/2019   Left ear tube  2001   Removal of pre-cancer bump on neck  2005   Ruptured appendics  1946   Sinus surgery  2008   Patient Active Problem List   Diagnosis Date Noted   Dizziness 09/14/2022   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   Preoperative clearance 06/08/2022   Pain due to onychomycosis of toenails of both feet 03/08/2022   Cancer of lower lip, 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   Vitreous floater, right 11/18/2021   Macular hole of right eye 09/29/2021   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 09/29/2021   Moderate mitral regurgitation 07/06/2021   Lower extremity edema 07/06/2021   Cystoid macular edema of  left eye 04/20/2021   Macular pucker, left eye 04/14/2020   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   Sleep apnea, obstructive 12/02/2019   Status post eye surgery 09/04/2019   Superficial keratitis with conjunctivitis, right 08/29/2019   Right epiretinal membrane 08/26/2019   Retinal telangiectasia of right eye 08/26/2019   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   Chronic otitis media 08/25/2015   Sensorineural hearing loss 08/25/2015   Convulsions/seizures (HCC) 10/24/2014   Hyperlipidemia 02/13/2013   Thoracic aortic aneurysm (HCC) 02/13/2013   Right bundle branch block 02/13/2013   Nonspecific abnormal electrocardiogram (ECG) (EKG) 01/02/2012   ACNE ROSACEA 09/09/2009   Essential hypertension 02/23/2009   ALLERGIC RHINITIS 02/23/2009   GERD 02/23/2009   URINARY INCONTINENCE 02/23/2009   COLONIC POLYPS, HX OF 02/23/2009   DIVERTICULITIS, HX OF 02/23/2009    PCP: Eleanora Neighbor, MD  REFERRING PROVIDER: Erven Colla, PA-C   REFERRING DIAG: C77.0 (ICD-10-CM) - 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   EVAL: They  did a biopsy on my throat but it would not heal up. It drained for a long time. After the radiation it initially looked good. The swelling started in March or April.   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 He was also diagnosed with cancer of lower lip and completed radiation for that on 04/12/22. Hx of thoracic aortic aneurysm  PATIENT GOALS:   to be educated about the signs and symptoms of lymphedema and learn post op HEP.   PAIN:  Are  you having pain? No  PRECAUTIONS: Comment (neck lymphedema)  WEIGHT BEARING RESTRICTIONS: No  FALLS:  Has patient fallen in last 6 months? No Does the patient have a fear of falling that limits activity? No Is the patient reluctant to leave the house due to a fear of falling?No  LIVING ENVIRONMENT: Patient lives with: wife Lives in: House/apartment Has following equipment at home: None  OCCUPATION: retired  LEISURE: pedals on foot bike, does arm exercises daily  PRIOR LEVEL OF FUNCTION: Independent   OBJECTIVE:  COGNITION: Overall cognitive status: Within functional limits for tasks assessed                  POSTURE:  Forward head and rounded shoulders posture  SHOULDER AROM:   WFL   CERVICAL AROM:   Percent limited  Flexion WFL sometimes with dizziness  Extension 50% limited dizziness  Right lateral flexion 25% limited  Left lateral flexion 25% limited  Right rotation WFL  Left rotation WFL    (Blank rows=not tested)  LYMPHEDEMA ASSESSMENT:    Circumference in cm 12/20/22  4 cm superior to sternal notch around neck 41.2 40.4  6 cm superior to sternal notch around neck 42 40.6  8 cm superior to sternal notch around neck 43.4 41.8  R lateral nostril from base of nose to medial tragus    L lateral nostril from base of nose to medial tragus    R corner of mouth to where ear lobe meets face 9.2 9.1  L corner of mouth to where ear lobe meets face 9.5 9.5          (Blank rows=not tested)  TODAY"S TREATMENT 12/26/22: Continued to educate pt in [place] Anterior approach  Went through steps 1-9d having pt return demonstrate each step while therapist provided verbal and tactile cues for proper skin stretch and sequence. Pt demonstrating improved skin stretch technique for all proximal steps. Still requires mod v/c to correct skin stretch and direction of stretch on neck.  Provided hand over hand demonstration. Pt went through entire proximal drainage technique and  neck sequence and by end of session demonstrated improved skn stretch technique.   12/22/22: Continued to educate pt in [facility] Anterior approach  Went through steps 1-9 having pt return demonstrate each step while therapist provided verbal and tactile cues for proper skin stretch and sequence. Pt having difficulty with skin stretch technique so changed axillary and clavicular areas to circles to simplify. Pt demonstrates good technique at anterior chest but has increased difficulty at neck. Provided hand over hand demonstration. Pt went through entire proximal drainage technique and by end of session demonstrated improved skn stretch technique.   12/20/22: Remeasured circumferences. Educated pt in [place] Anterior approach handout and issued handout to pt to follow. Went through steps 1-8 having pt return demonstrate each step while therapist provided verbal and tactile cues for proper skin stretch and sequence. Pt  went through entire proximal drainage technique and by end of session demonstrated improved skn stretch technique.   12/15/22: Educated pt in proper donning/doffing of his head and neck garment. Pt had difficulty donning with both straps un done so attached top of head strap at appropriate point and had pt practice donning with that strap already fastened. After several attempts pt was able to don his garment by only taking off the strap behind the neck. Pt reports his garment was comfortable. Educated him to wear it a minimum of 4 hours a day.  Educated pt in abbreviated version of self MLD as follows seated in chair in front of mirror: short neck, 5 deep breaths, anterior neck and cheeks stretching skin downwards. Spent extra time educating pt in proper skin stretch technique and not sliding hand on skin.   12/13/22 PT performed in supine HOB elevated with education throughout PT performed using [place] anterior approach but educated pt modified to minimize steps per below: PT performed as  follows: short neck, 5 diaphragmatic breaths, bilateral axillary nodes, bilateral pectoral nodes, anterior chest, short neck, posterior neck moving fluid towards pathway aimed at lateral neck, then lateral and anterior neck moving fluid towards pathway aimed at lateral neck then retracing all steps. Then pt performed steps with instruction in seated.   12/06/22 PT performed in supine HOB elevated with education throughout but no instruction yet: using Norton anterior approach handout as follows: short neck, 5 diaphragmatic breaths, bilateral axillary nodes, bilateral pectoral nodes, anterior chest, short neck, posterior neck moving fluid towards pathway aimed at lateral neck, then lateral and anterior neck moving fluid towards pathway aimed at lateral neck then retracing all steps    PATIENT EDUCATION:  Education details: anatomy and physiology of the lymphatic system, need for MLD and compression, how to obtain Tribute head and neck garment, self MLD Person educated: Patient Education method: Programmer, multimedia, Facilities manager, Handout Education comprehension: Patient verbalized understanding   HOME EXERCISE PROGRAM: None yet, self MLD Self Massage   Wear your compression at least 30 minutes, then remove it and do the massage  1.) 10 Circles at each collarbone  2.) 5 Deep breaths  3.) 10 circles in each armpit  4.) Stretch or Milk down the front of your neck where it is swollen for a few minutes  Then put your compression garment back on for at least 2-3 more hours     ASSESSMENT:  CLINICAL IMPRESSION: Pt demonstrating improving technique with proximal sequence. Instructed pt in drainage technique for anterior neck today and provided moderate v/c for correct technique.   Pt will benefit from skilled therapeutic intervention to improve on the following deficits: decreased knowledge of condition, decreased knowledge of use of DME, decreased ROM, increased edema, increased fascial restrictions, and  postural dysfunction  PT treatment/interventions: ADL/self-care home management, pt/family education, therapeutic exercise. Other interventions Therapeutic exercises, Therapeutic activity, Patient/Family education, Self Care, Orthotic/Fit training, Manual lymph drainage, Compression bandaging, Taping, Vasopneumatic device, Manual therapy, and Re-evaluation  REHAB POTENTIAL: Good  CLINICAL DECISION MAKING: Stable/uncomplicated  EVALUATION COMPLEXITY: Low   GOALS: Goals reviewed with patient? YES  LONG TERM GOALS: (STG=LTG)   Name Target Date  Goal status  1 Pt will be independent in self MLD for long term management of lymphedema.   12/13/2022 ONGOING 12/15/22  2 Patient will obtain a compression garment for long term management of lymphedema.   12/13/2022 MET 12/15/22 has tribute head and neck garment  3 Pt will demonstrate a 0.5 cm decrease in  edema from R lateral edge of lip to where earlobe meets face to decrease risk of infection.  12/13/2022 NEW    PLAN:  PT FREQUENCY/DURATION: 2x/wk for 4 wks   PLAN FOR NEXT SESSION: continue MLD using [place] anterior approach, instruct pt eventually and can issue [employer]	[place], how is he doing with his garment?    [employer], PT 12/26/2022, 11:03 AM

## 2022-12-28 ENCOUNTER — Ambulatory Visit: Payer: Medicare Other | Admitting: Physical Therapy

## 2022-12-28 ENCOUNTER — Encounter: Payer: Self-pay | Admitting: Physical Therapy

## 2022-12-28 DIAGNOSIS — I890 Lymphedema, not elsewhere classified: Secondary | ICD-10-CM

## 2022-12-28 DIAGNOSIS — C770 Secondary and unspecified malignant neoplasm of lymph nodes of head, face and neck: Secondary | ICD-10-CM

## 2022-12-28 DIAGNOSIS — R293 Abnormal posture: Secondary | ICD-10-CM

## 2022-12-28 NOTE — Therapy (Signed)
OUTPATIENT PHYSICAL THERAPY HEAD AND NECK TREATMENT   Patient Name: Zachary Padilla MRN: [id number] DOB:08-31-1928, 89 y.o., male Today's Date: 12/28/2022  END OF SESSION:  PT End of Session - 12/28/22 1008     Visit Number 8    Number of Visits 12    Date for PT Re-Evaluation 01/12/23    PT Start Time 1006    PT Stop Time 1052    PT Time Calculation (min) 46 min    Activity Tolerance Patient tolerated treatment well    Behavior During Therapy [facility] for tasks assessed/performed                Past Medical History:  Diagnosis Date   ACNE ROSACEA 09/09/2009   ALLERGIC RHINITIS 02/23/2009   Basal cell carcinoma 06/23/2010   R cheek- (CX35FU+ exc )   COLONIC POLYPS, HX OF 02/23/2009   Convulsions/seizures (HCC) 10/24/2014   DIVERTICULITIS, HX OF 02/23/2009   Ear infection    Family history of heart disease    GERD 02/23/2009   Hyperlipidemia    HYPERTENSION 02/23/2009   PONV (postoperative nausea and vomiting)    Posterior vitreous detachment of right eye 08/26/2019   Pre-syncope    Thoracic aortic aneurysm (HCC)    URINARY INCONTINENCE 02/23/2009   Past Surgical History:  Procedure Laterality Date   APPENDECTOMY  1946   CATARACT EXTRACTION Bilateral    EYE SURGERY Right 07/25/2019   Left ear tube  2001   Removal of pre-cancer bump on neck  2005   Ruptured appendics  1946   Sinus surgery  2008   Patient Active Problem List   Diagnosis Date Noted   Dizziness 09/14/2022   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   Preoperative clearance 06/08/2022   Pain due to onychomycosis of toenails of both feet 03/08/2022   Cancer of lower lip, 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   Vitreous floater, right 11/18/2021   Macular hole of right eye 09/29/2021   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 09/29/2021   Moderate mitral regurgitation 07/06/2021   Lower extremity edema 07/06/2021   Cystoid macular edema of  left eye 04/20/2021   Macular pucker, left eye 04/14/2020   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   Sleep apnea, obstructive 12/02/2019   Status post eye surgery 09/04/2019   Superficial keratitis with conjunctivitis, right 08/29/2019   Right epiretinal membrane 08/26/2019   Retinal telangiectasia of right eye 08/26/2019   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   Chronic otitis media 08/25/2015   Sensorineural hearing loss 08/25/2015   Convulsions/seizures (HCC) 10/24/2014   Hyperlipidemia 02/13/2013   Thoracic aortic aneurysm (HCC) 02/13/2013   Right bundle branch block 02/13/2013   Nonspecific abnormal electrocardiogram (ECG) (EKG) 01/02/2012   ACNE ROSACEA 09/09/2009   Essential hypertension 02/23/2009   ALLERGIC RHINITIS 02/23/2009   GERD 02/23/2009   URINARY INCONTINENCE 02/23/2009   COLONIC POLYPS, HX OF 02/23/2009   DIVERTICULITIS, HX OF 02/23/2009    PCP: Eleanora Neighbor, MD  REFERRING PROVIDER: Erven Colla, PA-C   REFERRING DIAG: C77.0 (ICD-10-CM) - 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 I didn't know I was  supposed to.   EVAL: They did a biopsy on my throat but it would not heal up. It drained for a long time. After the radiation it initially looked good. The swelling started in March or April.   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 He was also diagnosed with cancer of lower lip and completed radiation for that on 04/12/22. Hx of thoracic aortic aneurysm  PATIENT GOALS:   to be educated about the signs and symptoms of lymphedema and learn  post op HEP.   PAIN:  Are you having pain? No  PRECAUTIONS: Comment (neck lymphedema)  WEIGHT BEARING RESTRICTIONS: No  FALLS:  Has patient fallen in last 6 months? No Does the patient have a fear of falling that limits activity? No Is the patient reluctant to leave the house due to a fear of falling?No  LIVING ENVIRONMENT: Patient lives with: wife Lives in: House/apartment Has following equipment at home: None  OCCUPATION: retired  LEISURE: pedals on foot bike, does arm exercises daily  PRIOR LEVEL OF FUNCTION: Independent   OBJECTIVE:  COGNITION: Overall cognitive status: Within functional limits for tasks assessed                  POSTURE:  Forward head and rounded shoulders posture  SHOULDER AROM:   WFL   CERVICAL AROM:   Percent limited  Flexion WFL sometimes with dizziness  Extension 50% limited dizziness  Right lateral flexion 25% limited  Left lateral flexion 25% limited  Right rotation WFL  Left rotation WFL    (Blank rows=not tested)  LYMPHEDEMA ASSESSMENT:    Circumference in cm 12/20/22  4 cm superior to sternal notch around neck 41.2 40.4  6 cm superior to sternal notch around neck 42 40.6  8 cm superior to sternal notch around neck 43.4 41.8  R lateral nostril from base of nose to medial tragus    L lateral nostril from base of nose to medial tragus    R corner of mouth to where ear lobe meets face 9.2 9.1  L corner of mouth to where ear lobe meets face 9.5 9.5          (Blank rows=not tested)  TODAY"S TREATMENT 12/28/22: Continued to educate pt in [place] Anterior approach  Went through steps 1-9d having pt return demonstrate each step while therapist provided verbal and tactile cues for proper skin stretch and sequence. Pt demonstrating improved skin stretch technique for all steps. He required minimal cueing today for correct skin stretch technique. He required some v/c for sequence to understand direction of skin stretch.    12/26/22: Continued to educate pt in [facility] Anterior approach  Went through steps 1-9d having pt return demonstrate each step while therapist provided verbal and tactile cues for proper skin stretch and sequence. Pt demonstrating improved skin stretch technique for all proximal steps. Still requires mod v/c to correct skin stretch and direction of stretch on neck.  Provided hand over hand demonstration. Pt went through entire proximal drainage technique and neck sequence and by end of session demonstrated improved skn stretch technique.   12/22/22: Continued to educate pt in [facility] Anterior approach  Went through steps 1-9 having pt return demonstrate each step while therapist provided verbal and tactile cues for proper skin stretch and sequence. Pt having difficulty with skin stretch technique so changed axillary and clavicular areas to circles to simplify. Pt demonstrates good technique at anterior  chest but has increased difficulty at neck. Provided hand over hand demonstration. Pt went through entire proximal drainage technique and by end of session demonstrated improved skn stretch technique.   12/20/22: Remeasured circumferences. Educated pt in [place] Anterior approach handout and issued handout to pt to follow. Went through steps 1-8 having pt return demonstrate each step while therapist provided verbal and tactile cues for proper skin stretch and sequence. Pt went through entire proximal drainage technique and by end of session demonstrated improved skn stretch technique.   12/15/22: Educated pt in proper donning/doffing of his head and neck garment. Pt had difficulty donning with both straps un done so attached top of head strap at appropriate point and had pt practice donning with that strap already fastened. After several attempts pt was able to don his garment by only taking off the strap behind the neck. Pt reports his garment was comfortable. Educated him to wear it a minimum of 4 hours a  day.  Educated pt in abbreviated version of self MLD as follows seated in chair in front of mirror: short neck, 5 deep breaths, anterior neck and cheeks stretching skin downwards. Spent extra time educating pt in proper skin stretch technique and not sliding hand on skin.   12/13/22 PT performed in supine HOB elevated with education throughout PT performed using [place] anterior approach but educated pt modified to minimize steps per below: PT performed as follows: short neck, 5 diaphragmatic breaths, bilateral axillary nodes, bilateral pectoral nodes, anterior chest, short neck, posterior neck moving fluid towards pathway aimed at lateral neck, then lateral and anterior neck moving fluid towards pathway aimed at lateral neck then retracing all steps. Then pt performed steps with instruction in seated.   12/06/22 PT performed in supine HOB elevated with education throughout but no instruction yet: using [place] anterior approach handout as follows: short neck, 5 diaphragmatic breaths, bilateral axillary nodes, bilateral pectoral nodes, anterior chest, short neck, posterior neck moving fluid towards pathway aimed at lateral neck, then lateral and anterior neck moving fluid towards pathway aimed at lateral neck then retracing all steps    PATIENT EDUCATION:  Education details: anatomy and physiology of the lymphatic system, need for MLD and compression, how to obtain Tribute head and neck garment, self MLD Person educated: Patient Education method: Programmer, multimedia, Facilities manager, Handout Education comprehension: Patient verbalized understanding   HOME EXERCISE PROGRAM: None yet, self MLD Self Massage   Wear your compression at least 30 minutes, then remove it and do the massage  1.) 10 Circles at each collarbone  2.) 5 Deep breaths  3.) 10 circles in each armpit  4.) Stretch or Milk down the front of your neck where it is swollen for a few minutes  Then put your compression garment back on for at least  2-3 more hours     ASSESSMENT:  CLINICAL IMPRESSION: Pt demonstrated a great skin stretch today. He was able to return demonstrate each step through 9d with minimal cueing. Next session will instruct pt in the remainder of the sequence.   Pt will benefit from skilled therapeutic intervention to improve on the following deficits: decreased knowledge of condition, decreased knowledge of use of DME, decreased ROM, increased edema, increased fascial restrictions, and postural dysfunction  PT treatment/interventions: ADL/self-care home management, pt/family education, therapeutic exercise. Other interventions Therapeutic exercises, Therapeutic activity, Patient/Family education, Self Care, Orthotic/Fit training, Manual lymph drainage, Compression bandaging, Taping, Vasopneumatic device, Manual therapy, and Re-evaluation  REHAB POTENTIAL: Good  CLINICAL DECISION MAKING: Stable/uncomplicated  EVALUATION COMPLEXITY: Low   GOALS: Goals reviewed with patient? YES  LONG TERM GOALS: (STG=LTG)   Name Target Date  Goal status  1 Pt will be independent in self MLD for long term management of lymphedema.   12/13/2022 ONGOING 12/15/22  2 Patient will obtain a compression garment for long term management of lymphedema.   12/13/2022 MET 12/15/22 has tribute head and neck garment  3 Pt will demonstrate a 0.5 cm decrease in edema from R lateral edge of lip to where earlobe meets face to decrease risk of infection.  12/13/2022 NEW    PLAN:  PT FREQUENCY/DURATION: 2x/wk for 4 wks   PLAN FOR NEXT SESSION: continue MLD using [place] anterior approach, instruct pt eventually and can issue [employer]	[place], how is he doing with his garment?    [facility] [place], PT 12/28/2022, 10:56 AM

## 2023-01-02 ENCOUNTER — Encounter: Payer: Self-pay | Admitting: Rehabilitation

## 2023-01-02 ENCOUNTER — Ambulatory Visit: Payer: Medicare Other | Admitting: Rehabilitation

## 2023-01-02 DIAGNOSIS — I890 Lymphedema, not elsewhere classified: Secondary | ICD-10-CM

## 2023-01-02 DIAGNOSIS — C770 Secondary and unspecified malignant neoplasm of lymph nodes of head, face and neck: Secondary | ICD-10-CM

## 2023-01-02 DIAGNOSIS — R293 Abnormal posture: Secondary | ICD-10-CM

## 2023-01-02 NOTE — Therapy (Signed)
OUTPATIENT PHYSICAL THERAPY HEAD AND NECK TREATMENT   Patient Name: Zachary Padilla MRN: [id number] DOB:[birth year], 89 y.o., male Today's Date: 01/02/2023  END OF SESSION:  PT End of Session - 01/02/23 1305     Visit Number 9    Number of Visits 12    Date for PT Re-Evaluation 01/12/23    PT Start Time 1308    PT Stop Time 1355    PT Time Calculation (min) 47 min    Activity Tolerance Patient tolerated treatment well    Behavior During Therapy [facility] for tasks assessed/performed                 Past Medical History:  Diagnosis Date   ACNE ROSACEA 09/09/2009   ALLERGIC RHINITIS 02/23/2009   Basal cell carcinoma 06/23/2010   R cheek- (CX35FU+ exc )   COLONIC POLYPS, HX OF 02/23/2009   Convulsions/seizures (HCC) 10/24/2014   DIVERTICULITIS, HX OF 02/23/2009   Ear infection    Family history of heart disease    GERD 02/23/2009   Hyperlipidemia    HYPERTENSION 02/23/2009   PONV (postoperative nausea and vomiting)    Posterior vitreous detachment of right eye 08/26/2019   Pre-syncope    Thoracic aortic aneurysm (HCC)    URINARY INCONTINENCE 02/23/2009   Past Surgical History:  Procedure Laterality Date   APPENDECTOMY  1946   CATARACT EXTRACTION Bilateral    EYE SURGERY Right 07/25/2019   Left ear tube  2001   Removal of pre-cancer bump on neck  2005   Ruptured appendics  1946   Sinus surgery  2008   Patient Active Problem List   Diagnosis Date Noted   Dizziness 09/14/2022   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   Preoperative clearance 06/08/2022   Pain due to onychomycosis of toenails of both feet 03/08/2022   Cancer of lower lip, 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   Vitreous floater, right 11/18/2021   Macular hole of right eye 09/29/2021   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 09/29/2021   Moderate mitral regurgitation 07/06/2021   Lower extremity edema 07/06/2021   Cystoid macular edema of  left eye 04/20/2021   Macular pucker, left eye 04/14/2020   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   Sleep apnea, obstructive 12/02/2019   Status post eye surgery 09/04/2019   Superficial keratitis with conjunctivitis, right 08/29/2019   Right epiretinal membrane 08/26/2019   Retinal telangiectasia of right eye 08/26/2019   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   Chronic otitis media 08/25/2015   Sensorineural hearing loss 08/25/2015   Convulsions/seizures (HCC) 10/24/2014   Hyperlipidemia 02/13/2013   Thoracic aortic aneurysm (HCC) 02/13/2013   Right bundle branch block 02/13/2013   Nonspecific abnormal electrocardiogram (ECG) (EKG) 01/02/2012   ACNE ROSACEA 09/09/2009   Essential hypertension 02/23/2009   ALLERGIC RHINITIS 02/23/2009   GERD 02/23/2009   URINARY INCONTINENCE 02/23/2009   COLONIC POLYPS, HX OF 02/23/2009   DIVERTICULITIS, HX OF 02/23/2009    PCP: Eleanora Neighbor, MD  REFERRING PROVIDER: Erven Colla, PA-C   REFERRING DIAG: C77.0 (ICD-10-CM) - 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   EVAL: They did  a biopsy on my throat but it would not heal up. It drained for a long time. After the radiation it initially looked good. The swelling started in March or April.   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 He was also diagnosed with cancer of lower lip and completed radiation for that on 04/12/22. Hx of thoracic aortic aneurysm  PATIENT GOALS:   to be educated about the signs and symptoms of lymphedema and learn post op HEP.   PAIN:  Are you having pain?  No  PRECAUTIONS: Comment (neck lymphedema)  WEIGHT BEARING RESTRICTIONS: No  FALLS:  Has patient fallen in last 6 months? No Does the patient have a fear of falling that limits activity? No Is the patient reluctant to leave the house due to a fear of falling?No  LIVING ENVIRONMENT: Patient lives with: wife Lives in: House/apartment Has following equipment at home: None  OCCUPATION: retired  LEISURE: pedals on foot bike, does arm exercises daily  PRIOR LEVEL OF FUNCTION: Independent   OBJECTIVE:  COGNITION: Overall cognitive status: Within functional limits for tasks assessed                  POSTURE:  Forward head and rounded shoulders posture  SHOULDER AROM:   WFL   CERVICAL AROM:   Percent limited  Flexion WFL sometimes with dizziness  Extension 50% limited dizziness  Right lateral flexion 25% limited  Left lateral flexion 25% limited  Right rotation WFL  Left rotation WFL    (Blank rows=not tested)  LYMPHEDEMA ASSESSMENT:    Circumference in cm 12/20/22  4 cm superior to sternal notch around neck 41.2 40.4  6 cm superior to sternal notch around neck 42 40.6  8 cm superior to sternal notch around neck 43.4 41.8  R lateral nostril from base of nose to medial tragus    L lateral nostril from base of nose to medial tragus    R corner of mouth to where ear lobe meets face 9.2 9.1  L corner of mouth to where ear lobe meets face 9.5 9.5          (Blank rows=not tested)  TODAY"S TREATMENT 01/02/23: Continued to educate pt in [place] Anterior approach seated in a chair with cueing for each step.  PT read each step with demo, vcs and tcs still needed for hand placement and reasons for each step.  Pt was confused about some of the wording on the page including "chip pack", "pitting", "flat foam pad", so these were all explained and pt took some notes on his handout.  Sequence went through x 2 with focus on hand placements.   12/28/22: Continued to educate pt in  [facility] Anterior approach  Went through steps 1-9d having pt return demonstrate each step while therapist provided verbal and tactile cues for proper skin stretch and sequence. Pt demonstrating improved skin stretch technique for all steps. He required minimal cueing today for correct skin stretch technique. He required some v/c for sequence to understand direction of skin stretch.   12/26/22: Continued to educate pt in [facility] Anterior approach  Went through steps 1-9d having pt return demonstrate each step while therapist provided verbal and tactile cues for proper skin stretch and sequence. Pt demonstrating improved skin stretch technique for all proximal steps. Still requires mod v/c to correct skin stretch and direction of stretch on neck.  Provided hand over hand demonstration. Pt went  through entire proximal drainage technique and neck sequence and by end of session demonstrated improved skn stretch technique.   12/22/22: Continued to educate pt in [facility] Anterior approach  Went through steps 1-9 having pt return demonstrate each step while therapist provided verbal and tactile cues for proper skin stretch and sequence. Pt having difficulty with skin stretch technique so changed axillary and clavicular areas to circles to simplify. Pt demonstrates good technique at anterior chest but has increased difficulty at neck. Provided hand over hand demonstration. Pt went through entire proximal drainage technique and by end of session demonstrated improved skn stretch technique.   12/20/22: Remeasured circumferences. Educated pt in [place] Anterior approach handout and issued handout to pt to follow. Went through steps 1-8 having pt return demonstrate each step while therapist provided verbal and tactile cues for proper skin stretch and sequence. Pt went through entire proximal drainage technique and by end of session demonstrated improved skn stretch technique.   12/15/22: Educated pt in proper  donning/doffing of his head and neck garment. Pt had difficulty donning with both straps un done so attached top of head strap at appropriate point and had pt practice donning with that strap already fastened. After several attempts pt was able to don his garment by only taking off the strap behind the neck. Pt reports his garment was comfortable. Educated him to wear it a minimum of 4 hours a day.  Educated pt in abbreviated version of self MLD as follows seated in chair in front of mirror: short neck, 5 deep breaths, anterior neck and cheeks stretching skin downwards. Spent extra time educating pt in proper skin stretch technique and not sliding hand on skin.   12/13/22 PT performed in supine HOB elevated with education throughout PT performed using [place] anterior approach but educated pt modified to minimize steps per below: PT performed as follows: short neck, 5 diaphragmatic breaths, bilateral axillary nodes, bilateral pectoral nodes, anterior chest, short neck, posterior neck moving fluid towards pathway aimed at lateral neck, then lateral and anterior neck moving fluid towards pathway aimed at lateral neck then retracing all steps. Then pt performed steps with instruction in seated.   12/06/22 PT performed in supine HOB elevated with education throughout but no instruction yet: using [place] anterior approach handout as follows: short neck, 5 diaphragmatic breaths, bilateral axillary nodes, bilateral pectoral nodes, anterior chest, short neck, posterior neck moving fluid towards pathway aimed at lateral neck, then lateral and anterior neck moving fluid towards pathway aimed at lateral neck then retracing all steps    PATIENT EDUCATION:  Education details: anatomy and physiology of the lymphatic system, need for MLD and compression, how to obtain Tribute head and neck garment, self MLD Person educated: Patient Education method: Programmer, multimedia, Facilities manager, Handout Education comprehension: Patient  verbalized understanding   HOME EXERCISE PROGRAM: None yet, self MLD Self Massage   Wear your compression at least 30 minutes, then remove it and do the massage  1.) 10 Circles at each collarbone  2.) 5 Deep breaths  3.) 10 circles in each armpit  4.) Stretch or Milk down the front of your neck where it is swollen for a few minutes  Then put your compression garment back on for at least 2-3 more hours   OR Full [place] anterior handout   ASSESSMENT:  CLINICAL IMPRESSION: Pt seemed overall confused about self MLD using the full handout.  He was hung up on some of the wording but did well looking at the picture  while PT read each step.  He was confused on hand placements overall and did moderately well by the end of the session.   Pt will benefit from skilled therapeutic intervention to improve on the following deficits: decreased knowledge of condition, decreased knowledge of use of DME, decreased ROM, increased edema, increased fascial restrictions, and postural dysfunction  PT treatment/interventions: ADL/self-care home management, pt/family education, therapeutic exercise. Other interventions Therapeutic exercises, Therapeutic activity, Patient/Family education, Self Care, Orthotic/Fit training, Manual lymph drainage, Compression bandaging, Taping, Vasopneumatic device, Manual therapy, and Re-evaluation  REHAB POTENTIAL: Good  CLINICAL DECISION MAKING: Stable/uncomplicated  EVALUATION COMPLEXITY: Low   GOALS: Goals reviewed with patient? YES  LONG TERM GOALS: (STG=LTG)   Name Target Date  Goal status  1 Pt will be independent in self MLD for long term management of lymphedema.   12/13/2022 ONGOING 12/15/22  2 Patient will obtain a compression garment for long term management of lymphedema.   12/13/2022 MET 12/15/22 has tribute head and neck garment  3 Pt will demonstrate a 0.5 cm decrease in edema from R lateral edge of lip to where earlobe meets face to decrease risk of  infection.  12/13/2022 NEW    PLAN:  PT FREQUENCY/DURATION: 2x/wk for 4 wks   PLAN FOR NEXT SESSION: continue MLD using [place] anterior approach, instruct pt eventually and can issue [employer]	[place], how is he doing with his garment?    Idamae Lusher, PT 01/02/2023, 4:15 PM

## 2023-01-04 ENCOUNTER — Encounter: Payer: Self-pay | Admitting: Physical Therapy

## 2023-01-04 ENCOUNTER — Ambulatory Visit: Payer: Medicare Other | Admitting: Physical Therapy

## 2023-01-04 DIAGNOSIS — I890 Lymphedema, not elsewhere classified: Secondary | ICD-10-CM

## 2023-01-04 DIAGNOSIS — C770 Secondary and unspecified malignant neoplasm of lymph nodes of head, face and neck: Secondary | ICD-10-CM

## 2023-01-04 DIAGNOSIS — R293 Abnormal posture: Secondary | ICD-10-CM

## 2023-01-04 NOTE — Therapy (Addendum)
Progress Note Reporting Period 11/15/22 to 01/04/23  See note below for Objective Data and Assessment of Progress/Goals.    OUTPATIENT PHYSICAL THERAPY HEAD AND NECK TREATMENT   Patient Name: Zachary Padilla MRN: [id number] DOB:06-Jul-1928, 89 y.o., male Today's Date: 01/04/2023  END OF SESSION:  PT End of Session - 01/04/23 1931     Visit Number 10    Number of Visits 12    Date for PT Re-Evaluation 01/12/23    PT Start Time 1402    PT Stop Time 1458    PT Time Calculation (min) 56 min    Activity Tolerance Patient tolerated treatment well    Behavior During Therapy [facility] for tasks assessed/performed                 Past Medical History:  Diagnosis Date   ACNE ROSACEA 09/09/2009   ALLERGIC RHINITIS 02/23/2009   Basal cell carcinoma 06/23/2010   R cheek- (CX35FU+ exc )   COLONIC POLYPS, HX OF 02/23/2009   Convulsions/seizures (HCC) 10/24/2014   DIVERTICULITIS, HX OF 02/23/2009   Ear infection    Family history of heart disease    GERD 02/23/2009   Hyperlipidemia    HYPERTENSION 02/23/2009   PONV (postoperative nausea and vomiting)    Posterior vitreous detachment of right eye 08/26/2019   Pre-syncope    Thoracic aortic aneurysm (HCC)    URINARY INCONTINENCE 02/23/2009   Past Surgical History:  Procedure Laterality Date   APPENDECTOMY  1946   CATARACT EXTRACTION Bilateral    EYE SURGERY Right 07/25/2019   Left ear tube  2001   Removal of pre-cancer bump on neck  2005   Ruptured appendics  1946   Sinus surgery  2008   Patient Active Problem List   Diagnosis Date Noted   Dizziness 09/14/2022   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   Preoperative clearance 06/08/2022   Pain due to onychomycosis of toenails of both feet 03/08/2022   Cancer of lower lip, 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   Vitreous floater, right 11/18/2021   Macular hole of right eye 09/29/2021   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 09/29/2021   Moderate mitral regurgitation 07/06/2021   Lower extremity edema 07/06/2021   Cystoid macular edema of left eye 04/20/2021   Macular pucker, left eye 04/14/2020   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   Sleep apnea, obstructive 12/02/2019   Status post eye surgery 09/04/2019   Superficial keratitis with conjunctivitis, right 08/29/2019   Right epiretinal membrane 08/26/2019   Retinal telangiectasia of right eye 08/26/2019   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   Chronic otitis media 08/25/2015   Sensorineural hearing loss 08/25/2015   Convulsions/seizures (HCC) 10/24/2014   Hyperlipidemia 02/13/2013   Thoracic aortic aneurysm (HCC) 02/13/2013   Right bundle branch block 02/13/2013   Nonspecific abnormal electrocardiogram (ECG) (EKG) 01/02/2012   ACNE ROSACEA 09/09/2009   Essential hypertension 02/23/2009   ALLERGIC RHINITIS 02/23/2009   GERD 02/23/2009   URINARY INCONTINENCE 02/23/2009   COLONIC POLYPS, HX OF 02/23/2009   DIVERTICULITIS, HX OF 02/23/2009    PCP: Eleanora Neighbor, MD  REFERRING PROVIDER: Erven Colla, PA-C   REFERRING DIAG: C77.0 (ICD-10-CM) - 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 I don't know if I am doing them correctly.   EVAL: They did a biopsy on my throat but it would not heal up. It drained for a long time. After the radiation it initially looked good. The swelling started in March or April.   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 He was also diagnosed with cancer of lower lip and completed radiation for that on  04/12/22. Hx of thoracic aortic aneurysm  PATIENT GOALS:   to be educated about the signs and symptoms of lymphedema and learn post op HEP.   PAIN:  Are you having pain? No  PRECAUTIONS: Comment (neck lymphedema)  WEIGHT BEARING RESTRICTIONS: No  FALLS:  Has patient fallen in last 6 months? No Does the patient have a fear of falling that limits activity? No Is the patient reluctant to leave the house due to a fear of falling?No  LIVING ENVIRONMENT: Patient lives with: wife Lives in: House/apartment Has following equipment at home: None  OCCUPATION: retired  LEISURE: pedals on foot bike, does arm exercises daily  PRIOR LEVEL OF FUNCTION: Independent   OBJECTIVE:  COGNITION: Overall cognitive status: Within functional limits for tasks assessed                  POSTURE:  Forward head and rounded shoulders posture  SHOULDER AROM:   WFL   CERVICAL AROM:   Percent limited  Flexion WFL sometimes with dizziness  Extension 50% limited dizziness  Right lateral flexion 25% limited  Left lateral flexion 25% limited  Right rotation WFL  Left rotation WFL    (Blank rows=not tested)  LYMPHEDEMA ASSESSMENT:    Circumference in cm 12/20/22  4 cm superior to sternal notch around neck 41.2 40.4  6 cm superior to sternal notch around neck 42 40.6  8 cm superior to sternal notch around neck 43.4 41.8  R lateral nostril from base of nose to medial tragus    L lateral nostril from base of nose to medial tragus    R corner of mouth to where ear lobe meets face 9.2 9.1  L corner of mouth to where ear lobe meets face 9.5 9.5          (Blank rows=not tested)  TODAY"S TREATMENT 01/04/23: Continued to educate pt in [facility] Anterior approach  Went through entire sequence having pt return demonstrate each step while therapist provided verbal and tactile cues for proper skin stretch and sequence. Pt required min to mod cueing for proximal steps and skin stretch technique as well as  direction of stretch. He demonstrated more independence with the skin stretch on his neck. He was able to demonstrate entire sequence today.   01/02/23: Continued to educate pt in [place] Anterior approach seated in a chair with cueing for each step.  PT read each step with demo, vcs and tcs still needed for hand placement and reasons for each step.  Pt was confused about some of the wording on the page including "chip pack", "pitting", "flat foam pad", so these were all explained and pt took some notes on his handout.  Sequence went through x 2 with focus on hand placements.   12/28/22: Continued to educate pt in [facility] Anterior approach  Went through steps 1-9d having pt return demonstrate each step while therapist provided verbal and tactile cues for proper skin stretch and sequence. Pt  demonstrating improved skin stretch technique for all steps. He required minimal cueing today for correct skin stretch technique. He required some v/c for sequence to understand direction of skin stretch.   12/26/22: Continued to educate pt in [facility] Anterior approach  Went through steps 1-9d having pt return demonstrate each step while therapist provided verbal and tactile cues for proper skin stretch and sequence. Pt demonstrating improved skin stretch technique for all proximal steps. Still requires mod v/c to correct skin stretch and direction of stretch on neck.  Provided hand over hand demonstration. Pt went through entire proximal drainage technique and neck sequence and by end of session demonstrated improved skn stretch technique.   12/22/22: Continued to educate pt in [facility] Anterior approach  Went through steps 1-9 having pt return demonstrate each step while therapist provided verbal and tactile cues for proper skin stretch and sequence. Pt having difficulty with skin stretch technique so changed axillary and clavicular areas to circles to simplify. Pt demonstrates good technique at anterior chest but has  increased difficulty at neck. Provided hand over hand demonstration. Pt went through entire proximal drainage technique and by end of session demonstrated improved skn stretch technique.   12/20/22: Remeasured circumferences. Educated pt in [place] Anterior approach handout and issued handout to pt to follow. Went through steps 1-8 having pt return demonstrate each step while therapist provided verbal and tactile cues for proper skin stretch and sequence. Pt went through entire proximal drainage technique and by end of session demonstrated improved skn stretch technique.   12/15/22: Educated pt in proper donning/doffing of his head and neck garment. Pt had difficulty donning with both straps un done so attached top of head strap at appropriate point and had pt practice donning with that strap already fastened. After several attempts pt was able to don his garment by only taking off the strap behind the neck. Pt reports his garment was comfortable. Educated him to wear it a minimum of 4 hours a day.  Educated pt in abbreviated version of self MLD as follows seated in chair in front of mirror: short neck, 5 deep breaths, anterior neck and cheeks stretching skin downwards. Spent extra time educating pt in proper skin stretch technique and not sliding hand on skin.   12/13/22 PT performed in supine HOB elevated with education throughout PT performed using [place] anterior approach but educated pt modified to minimize steps per below: PT performed as follows: short neck, 5 diaphragmatic breaths, bilateral axillary nodes, bilateral pectoral nodes, anterior chest, short neck, posterior neck moving fluid towards pathway aimed at lateral neck, then lateral and anterior neck moving fluid towards pathway aimed at lateral neck then retracing all steps. Then pt performed steps with instruction in seated.   12/06/22 PT performed in supine HOB elevated with education throughout but no instruction yet: using [place] anterior  approach handout as follows: short neck, 5 diaphragmatic breaths, bilateral axillary nodes, bilateral pectoral nodes, anterior chest, short neck, posterior neck moving fluid towards pathway aimed at lateral neck, then lateral and anterior neck moving fluid towards pathway aimed at lateral neck then retracing all steps    PATIENT EDUCATION:  Education details: anatomy and physiology of the lymphatic system, need for MLD and compression, how to obtain Tribute head and neck garment, self MLD Person educated: Patient Education method: Programmer, multimedia, Facilities manager, Handout Education comprehension: Patient verbalized understanding   HOME EXERCISE PROGRAM: None yet, self MLD Self Massage   Wear your compression at least 30 minutes, then remove it  and do the massage  1.) 10 Circles at each collarbone  2.) 5 Deep breaths  3.) 10 circles in each armpit  4.) Stretch or Milk down the front of your neck where it is swollen for a few minutes  Then put your compression garment back on for at least 2-3 more hours   OR Full [place] anterior handout   ASSESSMENT:  CLINICAL IMPRESSION: Pt continued to require cueing on sequence and skin stretch technique though he is demonstrating increasing independence on his neck. He has an area of tenderness that the doctor told him is scar tissue so patient would benefit from some manual therapy to this area.   Pt will benefit from skilled therapeutic intervention to improve on the following deficits: decreased knowledge of condition, decreased knowledge of use of DME, decreased ROM, increased edema, increased fascial restrictions, and postural dysfunction  PT treatment/interventions: ADL/self-care home management, pt/family education, therapeutic exercise. Other interventions Therapeutic exercises, Therapeutic activity, Patient/Family education, Self Care, Orthotic/Fit training, Manual lymph drainage, Compression bandaging, Taping, Vasopneumatic device, Manual therapy,  and Re-evaluation  REHAB POTENTIAL: Good  CLINICAL DECISION MAKING: Stable/uncomplicated  EVALUATION COMPLEXITY: Low   GOALS: Goals reviewed with patient? YES  LONG TERM GOALS: (STG=LTG)   Name Target Date  Goal status  1 Pt will be independent in self MLD for long term management of lymphedema.   12/13/2022 ONGOING 12/15/22  2 Patient will obtain a compression garment for long term management of lymphedema.   12/13/2022 MET 12/15/22 has tribute head and neck garment  3 Pt will demonstrate a 0.5 cm decrease in edema from R lateral edge of lip to where earlobe meets face to decrease risk of infection.  12/13/2022 NEW    PLAN:  PT FREQUENCY/DURATION: 2x/wk for 4 wks   PLAN FOR NEXT SESSION: continue MLD using [place] anterior approach, instruct pt eventually and can issue [employer]	[place], how is he doing with his garment?    [address] [place], PT 01/04/2023, 7:32 PM

## 2023-01-10 ENCOUNTER — Ambulatory Visit: Payer: Medicare Other

## 2023-01-11 ENCOUNTER — Ambulatory Visit: Payer: Medicare Other | Attending: Radiology

## 2023-01-11 DIAGNOSIS — C770 Secondary and unspecified malignant neoplasm of lymph nodes of head, face and neck: Secondary | ICD-10-CM | POA: Diagnosis present

## 2023-01-11 DIAGNOSIS — R293 Abnormal posture: Secondary | ICD-10-CM | POA: Diagnosis present

## 2023-01-11 DIAGNOSIS — I890 Lymphedema, not elsewhere classified: Secondary | ICD-10-CM | POA: Diagnosis present

## 2023-01-11 NOTE — Therapy (Signed)
OUTPATIENT PHYSICAL THERAPY HEAD AND NECK TREATMENT   Patient Name: Zachary Padilla MRN: [id number] DOB:Apr 05, 1929, 89 y.o., male Today's Date: 01/11/2023  END OF SESSION:  PT End of Session - 01/11/23 0916     Visit Number 11    Number of Visits 12    Date for PT Re-Evaluation 01/12/23    PT Start Time 0912   pt arrived late   PT Stop Time 1002    PT Time Calculation (min) 50 min    Activity Tolerance Patient tolerated treatment well    Behavior During Therapy [facility] for tasks assessed/performed                 Past Medical History:  Diagnosis Date   ACNE ROSACEA 09/09/2009   ALLERGIC RHINITIS 02/23/2009   Basal cell carcinoma 06/23/2010   R cheek- (CX35FU+ exc )   COLONIC POLYPS, HX OF 02/23/2009   Convulsions/seizures (HCC) 10/24/2014   DIVERTICULITIS, HX OF 02/23/2009   Ear infection    Family history of heart disease    GERD 02/23/2009   Hyperlipidemia    HYPERTENSION 02/23/2009   PONV (postoperative nausea and vomiting)    Posterior vitreous detachment of right eye 08/26/2019   Pre-syncope    Thoracic aortic aneurysm (HCC)    URINARY INCONTINENCE 02/23/2009   Past Surgical History:  Procedure Laterality Date   APPENDECTOMY  1946   CATARACT EXTRACTION Bilateral    EYE SURGERY Right 07/25/2019   Left ear tube  2001   Removal of pre-cancer bump on neck  2005   Ruptured appendics  1946   Sinus surgery  2008   Patient Active Problem List   Diagnosis Date Noted   Dizziness 09/14/2022   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   Preoperative clearance 06/08/2022   Pain due to onychomycosis of toenails of both feet 03/08/2022   Cancer of lower lip, 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   Vitreous floater, right 11/18/2021   Macular hole of right eye 09/29/2021   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 09/29/2021   Moderate mitral regurgitation 07/06/2021   Lower extremity edema 07/06/2021   Cystoid  macular edema of left eye 04/20/2021   Macular pucker, left eye 04/14/2020   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   Sleep apnea, obstructive 12/02/2019   Status post eye surgery 09/04/2019   Superficial keratitis with conjunctivitis, right 08/29/2019   Right epiretinal membrane 08/26/2019   Retinal telangiectasia of right eye 08/26/2019   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   Chronic otitis media 08/25/2015   Sensorineural hearing loss 08/25/2015   Convulsions/seizures (HCC) 10/24/2014   Hyperlipidemia 02/13/2013   Thoracic aortic aneurysm (HCC) 02/13/2013   Right bundle branch block 02/13/2013   Nonspecific abnormal electrocardiogram (ECG) (EKG) 01/02/2012   ACNE ROSACEA 09/09/2009   Essential hypertension 02/23/2009   ALLERGIC RHINITIS 02/23/2009   GERD 02/23/2009   URINARY INCONTINENCE 02/23/2009   COLONIC POLYPS, HX OF 02/23/2009   DIVERTICULITIS, HX OF 02/23/2009    PCP: Eleanora Neighbor, MD  REFERRING PROVIDER: Erven Colla, PA-C   REFERRING DIAG: C77.0 (ICD-10-CM) - 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  EVAL: They did a biopsy on my throat but it would not heal up. It drained for a long time. After the radiation it initially looked good. The swelling started in March or April.   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 He was also diagnosed with cancer of lower lip and completed radiation for that on 04/12/22. Hx of thoracic aortic aneurysm  PATIENT GOALS:   to be educated about the signs and symptoms of lymphedema and learn  post op HEP.   PAIN:  Are you having pain? No  PRECAUTIONS: Comment (neck lymphedema)  WEIGHT BEARING RESTRICTIONS: No  FALLS:  Has patient fallen in last 6 months? No Does the patient have a fear of falling that limits activity? No Is the patient reluctant to leave the house due to a fear of falling?No  LIVING ENVIRONMENT: Patient lives with: wife Lives in: House/apartment Has following equipment at home: None  OCCUPATION: retired  LEISURE: pedals on foot bike, does arm exercises daily  PRIOR LEVEL OF FUNCTION: Independent   OBJECTIVE:  COGNITION: Overall cognitive status: Within functional limits for tasks assessed                  POSTURE:  Forward head and rounded shoulders posture  SHOULDER AROM:   WFL   CERVICAL AROM:   Percent limited  Flexion WFL sometimes with dizziness  Extension 50% limited dizziness  Right lateral flexion 25% limited  Left lateral flexion 25% limited  Right rotation WFL  Left rotation WFL    (Blank rows=not tested)  LYMPHEDEMA ASSESSMENT:    Circumference in cm 12/20/22  4 cm superior to sternal notch around neck 41.2 40.4  6 cm superior to sternal notch around neck 42 40.6  8 cm superior to sternal notch around neck 43.4 41.8  R lateral nostril from base of nose to medial tragus    L lateral nostril from base of nose to medial tragus    R corner of mouth to where ear lobe meets face 9.2 9.1  L corner of mouth to where ear lobe meets face 9.5 9.5          (Blank rows=not tested)  TODAY"S TREATMENT 01/11/23: Self Care Spent a lot of time at beginning of session explaining basics of anatomy of lymphatic system to pt verbally and with Jobst diagram answering his questions throughout. Also explained differences of both handouts that one is more simplified and the other is more involved. Also explained that he got both because once he mastered the simplified he requested the more involved one and did well with initial instruction  per PT that taught him. It seems he went home and started comparing handouts which is when he became confused.  Manual Therapy Performed shortened version of [facility] anterior approach handout as time allowed at end of session. Reviewed each step and directionality of each per handout. Pt was able to verbalize better understanding of this by end of session.   01/04/23: Continued to educate pt in [facility] Anterior approach  Went through entire sequence having pt return demonstrate each step while therapist provided verbal and tactile cues for proper skin stretch and sequence. Pt required min to mod cueing for proximal steps and skin stretch technique as well as direction of stretch. He demonstrated more independence with the skin stretch on his neck. He was able to demonstrate entire sequence today.   01/02/23: Continued to educate pt  in [place] Anterior approach seated in a chair with cueing for each step.  PT read each step with demo, vcs and tcs still needed for hand placement and reasons for each step.  Pt was confused about some of the wording on the page including "chip pack", "pitting", "flat foam pad", so these were all explained and pt took some notes on his handout.  Sequence went through x 2 with focus on hand placements.   12/28/22: Continued to educate pt in [facility] Anterior approach  Went through steps 1-9d having pt return demonstrate each step while therapist provided verbal and tactile cues for proper skin stretch and sequence. Pt demonstrating improved skin stretch technique for all steps. He required minimal cueing today for correct skin stretch technique. He required some v/c for sequence to understand direction of skin stretch.   12/26/22: Continued to educate pt in [facility] Anterior approach  Went through steps 1-9d having pt return demonstrate each step while therapist provided verbal and tactile cues for proper skin stretch and sequence. Pt demonstrating improved skin stretch technique  for all proximal steps. Still requires mod v/c to correct skin stretch and direction of stretch on neck.  Provided hand over hand demonstration. Pt went through entire proximal drainage technique and neck sequence and by end of session demonstrated improved skn stretch technique.   12/22/22: Continued to educate pt in [facility] Anterior approach  Went through steps 1-9 having pt return demonstrate each step while therapist provided verbal and tactile cues for proper skin stretch and sequence. Pt having difficulty with skin stretch technique so changed axillary and clavicular areas to circles to simplify. Pt demonstrates good technique at anterior chest but has increased difficulty at neck. Provided hand over hand demonstration. Pt went through entire proximal drainage technique and by end of session demonstrated improved skn stretch technique.   12/20/22: Remeasured circumferences. Educated pt in [place] Anterior approach handout and issued handout to pt to follow. Went through steps 1-8 having pt return demonstrate each step while therapist provided verbal and tactile cues for proper skin stretch and sequence. Pt went through entire proximal drainage technique and by end of session demonstrated improved skn stretch technique.   12/15/22: Educated pt in proper donning/doffing of his head and neck garment. Pt had difficulty donning with both straps un done so attached top of head strap at appropriate point and had pt practice donning with that strap already fastened. After several attempts pt was able to don his garment by only taking off the strap behind the neck. Pt reports his garment was comfortable. Educated him to wear it a minimum of 4 hours a day.  Educated pt in abbreviated version of self MLD as follows seated in chair in front of mirror: short neck, 5 deep breaths, anterior neck and cheeks stretching skin downwards. Spent extra time educating pt in proper skin stretch technique and not sliding hand on  skin.   12/13/22 PT performed in supine HOB elevated with education throughout PT performed using [facility] anterior approach but educated pt modified to minimize steps per below: PT performed as follows: short neck, 5 diaphragmatic breaths, bilateral axillary nodes, bilateral pectoral nodes, anterior chest, short neck, posterior neck moving fluid towards pathway aimed at lateral neck, then lateral and anterior neck moving fluid towards pathway aimed at lateral neck then retracing all steps. Then pt performed steps with instruction in seated.   12/06/22 PT performed in supine HOB elevated with education throughout but no instruction yet: using [facility] anterior  approach handout as follows: short neck, 5 diaphragmatic breaths, bilateral axillary nodes, bilateral pectoral nodes, anterior chest, short neck, posterior neck moving fluid towards pathway aimed at lateral neck, then lateral and anterior neck moving fluid towards pathway aimed at lateral neck then retracing all steps    PATIENT EDUCATION:  Education details: anatomy and physiology of the lymphatic system, need for MLD and compression, how to obtain Tribute head and neck garment, self MLD Person educated: Patient Education method: Programmer, multimedia, Facilities manager, Handout Education comprehension: Patient verbalized understanding   HOME EXERCISE PROGRAM: None yet, self MLD Self Massage   Wear your compression at least 30 minutes, then remove it and do the massage  1.) 10 Circles at each collarbone  2.) 5 Deep breaths  3.) 10 circles in each armpit  4.) Stretch or Milk down the front of your neck where it is swollen for a few minutes  Then put your compression garment back on for at least 2-3 more hours   OR Full [facility] anterior handout   ASSESSMENT:  CLINICAL IMPRESSION: Pt returns today feeling some frustrated as he was confused between two handouts. Spent time re-educating him on lymphatic system and answering questions throughout. Also  encouraged him to not be as focused on each detail as this is what was confusing him, but instead to focus on the bigger picture of encouraging of first "dehydrating the lymphatic vessels" at the anterior intact upper quadrants to prepare them to accept the fluid from the head and neck area. And then that directionality of stationary circles must always be towards the lateral neck areas. Then reviewed by performing per handout MLD sequence with explanation of each step at end of session as time allowed. Pt was able to verbalize a better understanding of sequence today and reported feeling more confident about understanding reasoning for lighter pressure which he has not been doing. Pt will benefit from at least one more session for final review.    Pt will benefit from skilled therapeutic intervention to improve on the following deficits: decreased knowledge of condition, decreased knowledge of use of DME, decreased ROM, increased edema, increased fascial restrictions, and postural dysfunction  PT treatment/interventions: ADL/self-care home management, pt/family education, therapeutic exercise. Other interventions Therapeutic exercises, Therapeutic activity, Patient/Family education, Self Care, Orthotic/Fit training, Manual lymph drainage, Compression bandaging, Taping, Vasopneumatic device, Manual therapy, and Re-evaluation  REHAB POTENTIAL: Good  CLINICAL DECISION MAKING: Stable/uncomplicated  EVALUATION COMPLEXITY: Low   GOALS: Goals reviewed with patient? YES  LONG TERM GOALS: (STG=LTG)   Name Target Date  Goal status  1 Pt will be independent in self MLD for long term management of lymphedema.   12/13/2022 ONGOING 12/15/22  2 Patient will obtain a compression garment for long term management of lymphedema.   12/13/2022 MET 12/15/22 has tribute head and neck garment  3 Pt will demonstrate a 0.5 cm decrease in edema from R lateral edge of lip to where earlobe meets face to decrease risk of  infection.  12/13/2022 NEW    PLAN:  PT FREQUENCY/DURATION: 2x/wk for 4 wks   PLAN FOR NEXT SESSION: continue MLD using [facility] anterior approach and [employer]	[place], if pt is more competent with self MLD ready for D/C next? Any questions about garment?    Hermenia Bers, PTA 01/11/2023, 10:18 AM

## 2023-01-13 ENCOUNTER — Ambulatory Visit: Payer: Medicare Other

## 2023-01-13 DIAGNOSIS — I890 Lymphedema, not elsewhere classified: Secondary | ICD-10-CM

## 2023-01-13 DIAGNOSIS — C770 Secondary and unspecified malignant neoplasm of lymph nodes of head, face and neck: Secondary | ICD-10-CM

## 2023-01-13 DIAGNOSIS — R293 Abnormal posture: Secondary | ICD-10-CM

## 2023-01-13 NOTE — Therapy (Signed)
OUTPATIENT PHYSICAL THERAPY HEAD AND NECK TREATMENT   Patient Name: Zachary Padilla MRN: [id number] DOB:Feb 17, 1929, 89 y.o., male Today's Date: 01/13/2023  END OF SESSION:  PT End of Session - 01/13/23 0922     Visit Number 12    Number of Visits 20   Date for PT Re-Evaluation 02/10/23    PT Start Time 0904    PT Stop Time 1000    PT Time Calculation (min) 56 min    Activity Tolerance Patient tolerated treatment well    Behavior During Therapy [facility] for tasks assessed/performed                 Past Medical History:  Diagnosis Date   ACNE ROSACEA 09/09/2009   ALLERGIC RHINITIS 02/23/2009   Basal cell carcinoma 06/23/2010   R cheek- (CX35FU+ exc )   COLONIC POLYPS, HX OF 02/23/2009   Convulsions/seizures (HCC) 10/24/2014   DIVERTICULITIS, HX OF 02/23/2009   Ear infection    Family history of heart disease    GERD 02/23/2009   Hyperlipidemia    HYPERTENSION 02/23/2009   PONV (postoperative nausea and vomiting)    Posterior vitreous detachment of right eye 08/26/2019   Pre-syncope    Thoracic aortic aneurysm (HCC)    URINARY INCONTINENCE 02/23/2009   Past Surgical History:  Procedure Laterality Date   APPENDECTOMY  1946   CATARACT EXTRACTION Bilateral    EYE SURGERY Right 07/25/2019   Left ear tube  2001   Removal of pre-cancer bump on neck  2005   Ruptured appendics  1946   Sinus surgery  2008   Patient Active Problem List   Diagnosis Date Noted   Dizziness 09/14/2022   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   Preoperative clearance 06/08/2022   Pain due to onychomycosis of toenails of both feet 03/08/2022   Cancer of lower lip, 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   Vitreous floater, right 11/18/2021   Macular hole of right eye 09/29/2021   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 09/29/2021   Moderate mitral regurgitation 07/06/2021   Lower extremity edema 07/06/2021   Cystoid macular edema of  left eye 04/20/2021   Macular pucker, left eye 04/14/2020   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   Sleep apnea, obstructive 12/02/2019   Status post eye surgery 09/04/2019   Superficial keratitis with conjunctivitis, right 08/29/2019   Right epiretinal membrane 08/26/2019   Retinal telangiectasia of right eye 08/26/2019   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   Chronic otitis media 08/25/2015   Sensorineural hearing loss 08/25/2015   Convulsions/seizures (HCC) 10/24/2014   Hyperlipidemia 02/13/2013   Thoracic aortic aneurysm (HCC) 02/13/2013   Right bundle branch block 02/13/2013   Nonspecific abnormal electrocardiogram (ECG) (EKG) 01/02/2012   ACNE ROSACEA 09/09/2009   Essential hypertension 02/23/2009   ALLERGIC RHINITIS 02/23/2009   GERD 02/23/2009   URINARY INCONTINENCE 02/23/2009   COLONIC POLYPS, HX OF 02/23/2009   DIVERTICULITIS, HX OF 02/23/2009    PCP: Eleanora Neighbor, MD  REFERRING PROVIDER: Erven Colla, PA-C   REFERRING DIAG: C77.0 (ICD-10-CM) - 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 Just need a little more review.   EVAL:  They did a biopsy on my throat but it would not heal up. It drained for a long time. After the radiation it initially looked good. The swelling started in March or April.   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 He was also diagnosed with cancer of lower lip and completed radiation for that on 04/12/22. Hx of thoracic aortic aneurysm  PATIENT GOALS:   to be educated about the signs and symptoms of lymphedema and learn post op HEP.    PAIN:  Are you having pain? No  PRECAUTIONS: Comment (neck lymphedema)  WEIGHT BEARING RESTRICTIONS: No  FALLS:  Has patient fallen in last 6 months? No Does the patient have a fear of falling that limits activity? No Is the patient reluctant to leave the house due to a fear of falling?No  LIVING ENVIRONMENT: Patient lives with: wife Lives in: House/apartment Has following equipment at home: None  OCCUPATION: retired  LEISURE: pedals on foot bike, does arm exercises daily  PRIOR LEVEL OF FUNCTION: Independent   OBJECTIVE:  COGNITION: Overall cognitive status: Within functional limits for tasks assessed                  POSTURE:  Forward head and rounded shoulders posture  SHOULDER AROM:   WFL   CERVICAL AROM:   Percent limited  Flexion WFL sometimes with dizziness  Extension 50% limited dizziness  Right lateral flexion 25% limited  Left lateral flexion 25% limited  Right rotation WFL  Left rotation WFL    (Blank rows=not tested)  LYMPHEDEMA ASSESSMENT:    Circumference in cm 12/20/22  4 cm superior to sternal notch around neck 41.2 40.4  6 cm superior to sternal notch around neck 42 40.6  8 cm superior to sternal notch around neck 43.4 41.8  R lateral nostril from base of nose to medial tragus    L lateral nostril from base of nose to medial tragus    R corner of mouth to where ear lobe meets face 9.2 9.1  L corner of mouth to where ear lobe meets face 9.5 9.5          (Blank rows=not tested)  TODAY"S TREATMENT 01/13/23: Self Care:  Spent beginning of session answering pts questions about certain hand positions of steps he had confusion about. Continued to remind him of overall picture about where we are moving the lymphatic fluid and when he gets stuck about which hand to use it doesn't necessarily matter as it's more of a comfort issue.  Manual Therapy Performed Norton H&N anterior approach standing behind pt with [place] both in front of mirror and  using hand over and technique with each step for pressure and directionality check. Reviewed each step and directionality of each per handout. Pt was able to verbalize better understanding of this by end of session.   01/11/23: Self Care Spent a lot of time at beginning of session explaining basics of anatomy of lymphatic system to pt verbally and with Jobst diagram answering his questions throughout. Also explained differences of both handouts that one is more simplified and the other is more involved. Also explained that he got both because once he mastered the simplified he requested the more involved one and did well with initial instruction per PT that taught him. It seems he went home and started comparing handouts which is when he became confused.  Manual Therapy Performed shortened version of Norton  anterior approach handout as time allowed at end of session. Reviewed each step and directionality of each per handout. Pt was able to verbalize better understanding of this by end of session.   01/04/23: Continued to educate pt in [facility] Anterior approach  Went through entire sequence having pt return demonstrate each step while therapist provided verbal and tactile cues for proper skin stretch and sequence. Pt required min to mod cueing for proximal steps and skin stretch technique as well as direction of stretch. He demonstrated more independence with the skin stretch on his neck. He was able to demonstrate entire sequence today.   01/02/23: Continued to educate pt in [place] Anterior approach seated in a chair with cueing for each step.  PT read each step with demo, vcs and tcs still needed for hand placement and reasons for each step.  Pt was confused about some of the wording on the page including "chip pack", "pitting", "flat foam pad", so these were all explained and pt took some notes on his handout.  Sequence went through x 2 with focus on hand placements.   12/28/22: Continued to educate pt  in [facility] Anterior approach  Went through steps 1-9d having pt return demonstrate each step while therapist provided verbal and tactile cues for proper skin stretch and sequence. Pt demonstrating improved skin stretch technique for all steps. He required minimal cueing today for correct skin stretch technique. He required some v/c for sequence to understand direction of skin stretch.   12/26/22: Continued to educate pt in [facility] Anterior approach  Went through steps 1-9d having pt return demonstrate each step while therapist provided verbal and tactile cues for proper skin stretch and sequence. Pt demonstrating improved skin stretch technique for all proximal steps. Still requires mod v/c to correct skin stretch and direction of stretch on neck.  Provided hand over hand demonstration. Pt went through entire proximal drainage technique and neck sequence and by end of session demonstrated improved skn stretch technique.   12/22/22: Continued to educate pt in [facility] Anterior approach  Went through steps 1-9 having pt return demonstrate each step while therapist provided verbal and tactile cues for proper skin stretch and sequence. Pt having difficulty with skin stretch technique so changed axillary and clavicular areas to circles to simplify. Pt demonstrates good technique at anterior chest but has increased difficulty at neck. Provided hand over hand demonstration. Pt went through entire proximal drainage technique and by end of session demonstrated improved skn stretch technique.   12/20/22: Remeasured circumferences. Educated pt in [facility] Anterior approach handout and issued handout to pt to follow. Went through steps 1-8 having pt return demonstrate each step while therapist provided verbal and tactile cues for proper skin stretch and sequence. Pt went through entire proximal drainage technique and by end of session demonstrated improved skn stretch technique.   12/15/22: Educated pt in proper  donning/doffing of his head and neck garment. Pt had difficulty donning with both straps un done so attached top of head strap at appropriate point and had pt practice donning with that strap already fastened. After several attempts pt was able to don his garment by only taking off the strap behind the neck. Pt reports his garment was comfortable. Educated him to wear it a minimum of 4 hours a day.  Educated pt in abbreviated version of self MLD as follows seated in chair in front of mirror: short neck, 5 deep breaths, anterior neck and cheeks stretching skin downwards. Spent extra time educating  pt in proper skin stretch technique and not sliding hand on skin.     PATIENT EDUCATION:  Education details: anatomy and physiology of the lymphatic system, need for MLD and compression, how to obtain Tribute head and neck garment, self MLD Person educated: Patient Education method: Programmer, multimedia, Facilities manager, Handout Education comprehension: Patient verbalized understanding   HOME EXERCISE PROGRAM: None yet, self MLD Self Massage   Wear your compression at least 30 minutes, then remove it and do the massage  1.) 10 Circles at each collarbone  2.) 5 Deep breaths  3.) 10 circles in each armpit  4.) Stretch or Milk down the front of your neck where it is swollen for a few minutes  Then put your compression garment back on for at least 2-3 more hours   OR Full norton anterior handout   ASSESSMENT:  CLINICAL IMPRESSION: Renewal done this session. Continued to review self MLD technique with pt. Answered his questions at beginning of session before review so he had better understanding going into review. Pt was able to demonstrate improved technique and confidence. Pt would like to return for at lest 1-2 more visits to finalize review.    Pt will benefit from skilled therapeutic intervention to improve on the following deficits: decreased knowledge of condition, decreased knowledge of use of DME,  decreased ROM, increased edema, increased fascial restrictions, and postural dysfunction  PT treatment/interventions: ADL/self-care home management, pt/family education, therapeutic exercise. Other interventions Therapeutic exercises, Therapeutic activity, Patient/Family education, Self Care, Orthotic/Fit training, Manual lymph drainage, Compression bandaging, Taping, Vasopneumatic device, Manual therapy, and Re-evaluation  REHAB POTENTIAL: Good  CLINICAL DECISION MAKING: Stable/uncomplicated  EVALUATION COMPLEXITY: Low   GOALS: Goals reviewed with patient? YES  LONG TERM GOALS: (STG=LTG)   Name Target Date  Goal status  1 Pt will be independent in self MLD for long term management of lymphedema.   12/13/2022 PARTIALLY MET  2 Patient will obtain a compression garment for long term management of lymphedema.   12/13/2022 MET 12/15/22 has tribute head and neck garment  3 Pt will demonstrate a 0.5 cm decrease in edema from R lateral edge of lip to where earlobe meets face to decrease risk of infection.  12/13/2022 ONGOING    PLAN:  PT FREQUENCY/DURATION: 2x/wk for 4 wks   PLAN FOR NEXT SESSION: continue MLD using Norton anterior approach and [employer]	[place], if pt is more competent with self MLD ready for D/C next? Any questions about garment?    Hermenia Bers, PTA 01/13/2023, 10:33 AM

## 2023-01-16 NOTE — Addendum Note (Signed)
Addended by: Jeanella Craze, Pamala Duffel L on: 01/16/2023 12:09 PM   Modules accepted: Orders

## 2023-01-17 ENCOUNTER — Encounter: Payer: Medicare Other | Admitting: Physical Therapy

## 2023-01-18 ENCOUNTER — Ambulatory Visit: Payer: Medicare Other

## 2023-01-18 DIAGNOSIS — I890 Lymphedema, not elsewhere classified: Secondary | ICD-10-CM | POA: Diagnosis not present

## 2023-01-18 DIAGNOSIS — R293 Abnormal posture: Secondary | ICD-10-CM

## 2023-01-18 DIAGNOSIS — C770 Secondary and unspecified malignant neoplasm of lymph nodes of head, face and neck: Secondary | ICD-10-CM

## 2023-01-18 NOTE — Therapy (Signed)
OUTPATIENT PHYSICAL THERAPY HEAD AND NECK TREATMENT   Patient Name: Zachary Padilla MRN: [id number] DOB:[birth year], 89 y.o., male Today's Date: 01/18/2023  END OF SESSION:  PT End of Session - 01/13/23 0922     Visit Number 12    Number of Visits 20   Date for PT Re-Evaluation 02/10/23    PT Start Time 0904    PT Stop Time 1000    PT Time Calculation (min) 56 min    Activity Tolerance Patient tolerated treatment well    Behavior During Therapy [facility] for tasks assessed/performed                 Past Medical History:  Diagnosis Date   ACNE ROSACEA 09/09/2009   ALLERGIC RHINITIS 02/23/2009   Basal cell carcinoma 06/23/2010   R cheek- (CX35FU+ exc )   COLONIC POLYPS, HX OF 02/23/2009   Convulsions/seizures (HCC) 10/24/2014   DIVERTICULITIS, HX OF 02/23/2009   Ear infection    Family history of heart disease    GERD 02/23/2009   Hyperlipidemia    HYPERTENSION 02/23/2009   PONV (postoperative nausea and vomiting)    Posterior vitreous detachment of right eye 08/26/2019   Pre-syncope    Thoracic aortic aneurysm (HCC)    URINARY INCONTINENCE 02/23/2009   Past Surgical History:  Procedure Laterality Date   APPENDECTOMY  1946   CATARACT EXTRACTION Bilateral    EYE SURGERY Right 07/25/2019   Left ear tube  2001   Removal of pre-cancer bump on neck  2005   Ruptured appendics  1946   Sinus surgery  2008   Patient Active Problem List   Diagnosis Date Noted   Dizziness 09/14/2022   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   Preoperative clearance 06/08/2022   Pain due to onychomycosis of toenails of both feet 03/08/2022   Cancer of lower lip, 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   Vitreous floater, right 11/18/2021   Macular hole of right eye 09/29/2021   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 09/29/2021   Moderate mitral regurgitation 07/06/2021   Lower extremity edema 07/06/2021   Cystoid macular edema of  left eye 04/20/2021   Macular pucker, left eye 04/14/2020   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   Sleep apnea, obstructive 12/02/2019   Status post eye surgery 09/04/2019   Superficial keratitis with conjunctivitis, right 08/29/2019   Right epiretinal membrane 08/26/2019   Retinal telangiectasia of right eye 08/26/2019   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   Chronic otitis media 08/25/2015   Sensorineural hearing loss 08/25/2015   Convulsions/seizures (HCC) 10/24/2014   Hyperlipidemia 02/13/2013   Thoracic aortic aneurysm (HCC) 02/13/2013   Right bundle branch block 02/13/2013   Nonspecific abnormal electrocardiogram (ECG) (EKG) 01/02/2012   ACNE ROSACEA 09/09/2009   Essential hypertension 02/23/2009   ALLERGIC RHINITIS 02/23/2009   GERD 02/23/2009   URINARY INCONTINENCE 02/23/2009   COLONIC POLYPS, HX OF 02/23/2009   DIVERTICULITIS, HX OF 02/23/2009    PCP: Eleanora Neighbor, MD  REFERRING PROVIDER: Erven Colla, PA-C   REFERRING DIAG: C77.0 (ICD-10-CM) - 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 Just need a little more review.   EVAL:  They did a biopsy on my throat but it would not heal up. It drained for a long time. After the radiation it initially looked good. The swelling started in March or April.   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 He was also diagnosed with cancer of lower lip and completed radiation for that on 04/12/22. Hx of thoracic aortic aneurysm  PATIENT GOALS:   to be educated about the signs and symptoms of lymphedema and learn post op HEP.    PAIN:  Are you having pain? No  PRECAUTIONS: Comment (neck lymphedema)  WEIGHT BEARING RESTRICTIONS: No  FALLS:  Has patient fallen in last 6 months? No Does the patient have a fear of falling that limits activity? No Is the patient reluctant to leave the house due to a fear of falling?No  LIVING ENVIRONMENT: Patient lives with: wife Lives in: House/apartment Has following equipment at home: None  OCCUPATION: retired  LEISURE: pedals on foot bike, does arm exercises daily  PRIOR LEVEL OF FUNCTION: Independent   OBJECTIVE:  COGNITION: Overall cognitive status: Within functional limits for tasks assessed                  POSTURE:  Forward head and rounded shoulders posture  SHOULDER AROM:   WFL   CERVICAL AROM:   Percent limited  Flexion WFL sometimes with dizziness  Extension 50% limited dizziness  Right lateral flexion 25% limited  Left lateral flexion 25% limited  Right rotation WFL  Left rotation WFL    (Blank rows=not tested)  LYMPHEDEMA ASSESSMENT:    Circumference in cm 12/20/22  4 cm superior to sternal notch around neck 41.2 40.4  6 cm superior to sternal notch around neck 42 40.6  8 cm superior to sternal notch around neck 43.4 41.8  R lateral nostril from base of nose to medial tragus    L lateral nostril from base of nose to medial tragus    R corner of mouth to where ear lobe meets face 9.2 9.1  L corner of mouth to where ear lobe meets face 9.5 9.5          (Blank rows=not tested)  TODAY"S TREATMENT 01/18/23: Self Care Continued with answering pts questions about sequence and confirming certain steps and parts of sequence that he was questioning. Pt was able to verbalize he is feeling much more confident with his technique and understanding when doing this at home. Reminded him not to over think the steps as that is when he confuses himself.  Manual Therapy Performed entire sequence on pt per [facility] handout for anterior approach reviewing  with pt reasoning for each step to allow for improving his confidence.  01/13/23: Self Care:  Spent beginning of session answering pts questions about certain hand positions of steps he had confusion about. Continued to remind him of overall picture about where we are moving the lymphatic fluid and when he gets stuck about which hand to use it doesn't necessarily matter as it's more of a comfort issue.  Manual Therapy Performed Norton H&N anterior approach standing behind pt with [place] both in front of mirror and using hand over and technique with each step for pressure and directionality check. Reviewed each step and directionality of each per handout. Pt was able to verbalize better understanding of this by end of session.   01/11/23: Self Care Spent a lot of time at beginning of session explaining  basics of anatomy of lymphatic system to pt verbally and with Jobst diagram answering his questions throughout. Also explained differences of both handouts that one is more simplified and the other is more involved. Also explained that he got both because once he mastered the simplified he requested the more involved one and did well with initial instruction per PT that taught him. It seems he went home and started comparing handouts which is when he became confused.  Manual Therapy Performed shortened version of Norton anterior approach handout as time allowed at end of session. Reviewed each step and directionality of each per handout. Pt was able to verbalize better understanding of this by end of session.      PATIENT EDUCATION:  Education details: anatomy and physiology of the lymphatic system, need for MLD and compression, how to obtain Tribute head and neck garment, self MLD Person educated: Patient Education method: Programmer, multimedia, Facilities manager, Handout Education comprehension: Patient verbalized understanding   HOME EXERCISE PROGRAM: None yet, self MLD Self Massage   Wear your compression at  least 30 minutes, then remove it and do the massage  1.) 10 Circles at each collarbone  2.) 5 Deep breaths  3.) 10 circles in each armpit  4.) Stretch or Milk down the front of your neck where it is swollen for a few minutes  Then put your compression garment back on for at least 2-3 more hours   OR Full norton anterior handout   ASSESSMENT:  CLINICAL IMPRESSION: Pt has another biopsy scheduled for his nose Monday 01/23/23. He would  like to return for one final visit in 2 weeks after having biopsy. Today continued with MLD to head/neck reviewing with pt while performing and answering his questions throughout.    Pt will benefit from skilled therapeutic intervention to improve on the following deficits: decreased knowledge of condition, decreased knowledge of use of DME, decreased ROM, increased edema, increased fascial restrictions, and postural dysfunction  PT treatment/interventions: ADL/self-care home management, pt/family education, therapeutic exercise. Other interventions Therapeutic exercises, Therapeutic activity, Patient/Family education, Self Care, Orthotic/Fit training, Manual lymph drainage, Compression bandaging, Taping, Vasopneumatic device, Manual therapy, and Re-evaluation  REHAB POTENTIAL: Good  CLINICAL DECISION MAKING: Stable/uncomplicated  EVALUATION COMPLEXITY: Low   GOALS: Goals reviewed with patient? YES  LONG TERM GOALS: (STG=LTG)   Name Target Date  Goal status  1 Pt will be independent in self MLD for long term management of lymphedema.   12/13/2022 PARTIALLY MET  2 Patient will obtain a compression garment for long term management of lymphedema.   12/13/2022 MET 12/15/22 has tribute head and neck garment  3 Pt will demonstrate a 0.5 cm decrease in edema from R lateral edge of lip to where earlobe meets face to decrease risk of infection.  12/13/2022 ONGOING    PLAN:  PT FREQUENCY/DURATION: 2x/wk for 4 wks   PLAN FOR NEXT SESSION: Reassess goals and  probable D/C next;continue MLD using Norton anterior approach and [employer]	[place].    Hermenia Bers, PTA 01/18/2023, 11:32 AM

## 2023-01-19 ENCOUNTER — Encounter: Payer: Medicare Other | Admitting: Physical Therapy

## 2023-01-24 ENCOUNTER — Encounter: Payer: Medicare Other | Admitting: Physical Therapy

## 2023-01-25 ENCOUNTER — Telehealth: Payer: Self-pay | Admitting: *Deleted

## 2023-01-25 NOTE — Telephone Encounter (Signed)
CALLED PATIENT TO INFORM OF CT FOR 02-24-23- ARRIVAL TIME- 12:45 PM @ [facility], NO RESTRICTIONS TO SCAN, PATIENT TO RECEIVE RESULTS FROM DR. SQUIRE ON 02-28-23 @ 2:20 PM, SPOKE WITH PATIENT AND HE IS AWARE OF THESE APPTS. AND THE INSTRUCTIONS

## 2023-01-26 ENCOUNTER — Encounter: Payer: Medicare Other | Admitting: Rehabilitation

## 2023-01-31 ENCOUNTER — Ambulatory Visit: Payer: Medicare Other

## 2023-01-31 ENCOUNTER — Encounter: Payer: Medicare Other | Admitting: Physical Therapy

## 2023-02-01 ENCOUNTER — Ambulatory Visit: Payer: Medicare Other

## 2023-02-01 DIAGNOSIS — I890 Lymphedema, not elsewhere classified: Secondary | ICD-10-CM

## 2023-02-01 DIAGNOSIS — C770 Secondary and unspecified malignant neoplasm of lymph nodes of head, face and neck: Secondary | ICD-10-CM

## 2023-02-01 DIAGNOSIS — R293 Abnormal posture: Secondary | ICD-10-CM

## 2023-02-01 NOTE — Therapy (Addendum)
OUTPATIENT PHYSICAL THERAPY HEAD AND NECK TREATMENT   Patient Name: Zachary Padilla MRN: [id number] DOB:[birth year], 89 y.o., male Today's Date: 02/01/2023  END OF SESSION:  PT End of Session - 01/13/23 0922     Visit Number 12    Number of Visits 20   Date for PT Re-Evaluation 02/10/23    PT Start Time 0904    PT Stop Time 1000    PT Time Calculation (min) 56 min    Activity Tolerance Patient tolerated treatment well    Behavior During Therapy [facility] for tasks assessed/performed                 Past Medical History:  Diagnosis Date   ACNE ROSACEA 09/09/2009   ALLERGIC RHINITIS 02/23/2009   Basal cell carcinoma 06/23/2010   R cheek- (CX35FU+ exc )   COLONIC POLYPS, HX OF 02/23/2009   Convulsions/seizures (HCC) 10/24/2014   DIVERTICULITIS, HX OF 02/23/2009   Ear infection    Family history of heart disease    GERD 02/23/2009   Hyperlipidemia    HYPERTENSION 02/23/2009   PONV (postoperative nausea and vomiting)    Posterior vitreous detachment of right eye 08/26/2019   Pre-syncope    Thoracic aortic aneurysm (HCC)    URINARY INCONTINENCE 02/23/2009   Past Surgical History:  Procedure Laterality Date   APPENDECTOMY  1946   CATARACT EXTRACTION Bilateral    EYE SURGERY Right 07/25/2019   Left ear tube  2001   Removal of pre-cancer bump on neck  2005   Ruptured appendics  1946   Sinus surgery  2008   Patient Active Problem List   Diagnosis Date Noted   Dizziness 09/14/2022   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   Preoperative clearance 06/08/2022   Pain due to onychomycosis of toenails of both feet 03/08/2022   Cancer of lower lip, 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   Vitreous floater, right 11/18/2021   Macular hole of right eye 09/29/2021   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 09/29/2021   Moderate mitral regurgitation 07/06/2021   Lower extremity edema 07/06/2021   Cystoid macular edema of  left eye 04/20/2021   Macular pucker, left eye 04/14/2020   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   Sleep apnea, obstructive 12/02/2019   Status post eye surgery 09/04/2019   Superficial keratitis with conjunctivitis, right 08/29/2019   Right epiretinal membrane 08/26/2019   Retinal telangiectasia of right eye 08/26/2019   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   Chronic otitis media 08/25/2015   Sensorineural hearing loss 08/25/2015   Convulsions/seizures (HCC) 10/24/2014   Hyperlipidemia 02/13/2013   Thoracic aortic aneurysm (HCC) 02/13/2013   Right bundle branch block 02/13/2013   Nonspecific abnormal electrocardiogram (ECG) (EKG) 01/02/2012   ACNE ROSACEA 09/09/2009   Essential hypertension 02/23/2009   ALLERGIC RHINITIS 02/23/2009   GERD 02/23/2009   URINARY INCONTINENCE 02/23/2009   COLONIC POLYPS, HX OF 02/23/2009   DIVERTICULITIS, HX OF 02/23/2009    PCP: Eleanora Neighbor, MD  REFERRING PROVIDER: Erven Colla, PA-C   REFERRING DIAG: C77.0 (ICD-10-CM) - 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   EVAL: They did a biopsy on my throat but it would not heal up. It drained for a long time. After the radiation it initially looked good. The swelling started in March or April.   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 He was also diagnosed with cancer of lower lip and completed radiation for that on 04/12/22. Hx of thoracic aortic aneurysm  PATIENT GOALS:   to be educated  about the signs and symptoms of lymphedema and learn post op HEP.   PAIN:  Are you having pain? No  PRECAUTIONS: Comment (neck lymphedema)  WEIGHT BEARING RESTRICTIONS: No  FALLS:  Has patient fallen in last 6 months? No Does the patient have a fear of falling that limits activity? No Is the patient reluctant to leave the house due to a fear of falling?No  LIVING ENVIRONMENT: Patient lives with: wife Lives in: House/apartment Has following equipment at home: None  OCCUPATION: retired  LEISURE: pedals on foot bike, does arm exercises daily  PRIOR LEVEL OF FUNCTION: Independent   OBJECTIVE:  COGNITION: Overall cognitive status: Within functional limits for tasks assessed                  POSTURE:  Forward head and rounded shoulders posture  SHOULDER AROM:   WFL   CERVICAL AROM:   Percent limited  Flexion WFL sometimes with dizziness  Extension 50% limited dizziness  Right lateral flexion 25% limited  Left lateral flexion 25% limited  Right rotation WFL  Left rotation WFL    (Blank rows=not tested)  LYMPHEDEMA ASSESSMENT:    Circumference in cm 12/20/22 02/01/23  4 cm superior to sternal notch around neck 41.2 40.4 39.9  6 cm superior to sternal notch around neck 42 40.6 40.3  8 cm superior to sternal notch around neck 43.4 41.8 41.7  R lateral nostril from base of nose to medial tragus     L lateral nostril from base of nose to medial tragus     R corner of mouth to where ear lobe meets face 9.2 9.1 8.9  L corner of mouth to where ear lobe meets face 9.5 9.5 9.1            (Blank rows=not tested)  TODAY"S TREATMENT 02/01/23: Self Care Discussed pts current functional status. He hasn't done MLD since most recent nose biopsy on 9/16. Okayed him to resume this today since he won't be creating any skin stretch on healing skin at nose as all hand placements stay below mandible. Answered his questions for review of MLD and about the lymphatic system. Then  reviewed MLD with pt performing on self except for some cuing, see manual section. Did this per [facility] anterior approach handout.  Manual Therapy Circumference measurements retaken at head and neck. Some good reductions noted since eval. Then reviewed MLD mostly with pt performing on self but some hand over hand cuing given along with demo on pt prn.  01/18/23: Self Care Continued with answering pts questions about sequence and confirming certain steps and parts of sequence that he was questioning. Pt was able to verbalize he is feeling much more confident with his technique and understanding when doing this at home. Reminded him not to over think the steps as that is when he confuses himself.  Manual Therapy Performed entire sequence on pt per  [facility] handout for anterior approach reviewing with pt reasoning for each step to allow for improving his confidence.  01/13/23: Self Care:  Spent beginning of session answering pts questions about certain hand positions of steps he had confusion about. Continued to remind him of overall picture about where we are moving the lymphatic fluid and when he gets stuck about which hand to use it doesn't necessarily matter as it's more of a comfort issue.  Manual Therapy Performed [facility] anterior approach standing behind pt with [place] both in front of mirror and using hand over and technique with each step for pressure and directionality check. Reviewed each step and directionality of each per handout. Pt was able to verbalize better understanding of this by end of session.   01/11/23: Self Care Spent a lot of time at beginning of session explaining basics of anatomy of lymphatic system to pt verbally and with Jobst diagram answering his questions throughout. Also explained differences of both handouts that one is more simplified and the other is more involved. Also explained that he got both because once he mastered the simplified he requested the more involved one  and did well with initial instruction per PT that taught him. It seems he went home and started comparing handouts which is when he became confused.  Manual Therapy Performed shortened version of [facility] anterior approach handout as time allowed at end of session. Reviewed each step and directionality of each per handout. Pt was able to verbalize better understanding of this by end of session.      PATIENT EDUCATION:  Education details: anatomy and physiology of the lymphatic system, need for MLD and compression, how to obtain Tribute head and neck garment, self MLD Person educated: Patient Education method: Programmer, multimedia, Facilities manager, Handout Education comprehension: Patient verbalized understanding   HOME EXERCISE PROGRAM: None yet, self MLD Self Massage   Wear your compression at least 30 minutes, then remove it and do the massage  1.) 10 Circles at each collarbone  2.) 5 Deep breaths  3.) 10 circles in each armpit  4.) Stretch or Milk down the front of your neck where it is swollen for a few minutes  Then put your compression garment back on for at least 2-3 more hours   OR Full [facility] anterior handout   ASSESSMENT:  CLINICAL IMPRESSION: Pt returns after having another biopsy on his nose. He reports doctor told him to hold off on Mld after surgery but didn't say for how long so advised pt okay to resume as he won't be creating any pull on healing skin. Also how this can help with healing because if he starts noticing any facial lymphedema, doing MLD below this will help with decongestion of that. Answered pts questions today and did final review of self MLD sitting in front of him and performing with demo and some hand over hand cuing for the directionality of skin stretch. And verbal cuing for lighter pressure at time but overall pt did well with this and seemed to have a good understanding of the sequence and basic principles of MLD. His circumference measurements have also reduced  well during this episode of care. Mr. Landuyt was told that if her has any further questions he is free to cal this clinic and if he has nay other lymphedema needs in the future, especially if he notices face lymphedema if he has more biopsies, then we will be happy to treat that with a new doctor referral.   Pt  will benefit from skilled therapeutic intervention to improve on the following deficits: decreased knowledge of condition, decreased knowledge of use of DME, decreased ROM, increased edema, increased fascial restrictions, and postural dysfunction  PT treatment/interventions: ADL/self-care home management, pt/family education, therapeutic exercise. Other interventions Therapeutic exercises, Therapeutic activity, Patient/Family education, Self Care, Orthotic/Fit training, Manual lymph drainage, Compression bandaging, Taping, Vasopneumatic device, Manual therapy, and Re-evaluation  REHAB POTENTIAL: Good  CLINICAL DECISION MAKING: Stable/uncomplicated  EVALUATION COMPLEXITY: Low   GOALS: Goals reviewed with patient? YES  LONG TERM GOALS: (STG=LTG)   Name Target Date  Goal status  1 Pt will be independent in self MLD for long term management of lymphedema.   02/10/2023 MET  2 Patient will obtain a compression garment for long term management of lymphedema.   12/13/2022 MET 12/15/22 has tribute head and neck garment  3 Pt will demonstrate a 0.5 cm decrease in edema from R lateral edge of lip to where earlobe meets face to decrease risk of infection.  02/10/2023 PARTIALLY MET - 0.3 cm reduction    PLAN:  PT FREQUENCY/DURATION: 2x/wk for 4 wks   PLAN FOR NEXT SESSION: D/C this visit.    Hermenia Bers, PTA 02/01/2023, 1:36 PM  PHYSICAL THERAPY DISCHARGE SUMMARY  Visits from Start of Care: 12  Current functional level related to goals / functional outcomes: See above   Remaining deficits: See above    Education / Equipment: Lymphedema, self MLD, compression garment    Patient agrees to discharge. Patient goals were partially met. Patient is being discharged due to being pleased with the current functional level.  Milagros Loll [place], [place] 02/08/23 12:30 PM

## 2023-02-02 ENCOUNTER — Encounter: Payer: Medicare Other | Admitting: Physical Therapy

## 2023-02-14 NOTE — Progress Notes (Signed)
Zachary Padilla presents to clinic today for follow up for completion of radiation therapy to his neck . He completed treatment on the 07-18-22.   Neck CT on 02-24-23.  IMPRESSION: 1. Post treatment changes without evidence of recurrent disease in the neck. 2. Left mastoid and middle ear effusions.   Pain issues, if any: now having shooting pains in lip after ct scan Using a feeding tube?: no Weight changes, if any:  Wt Readings from Last 3 Encounters:  02/28/23 157 lb 6 oz (71.4 kg)  02/15/23 153 lb (69.4 kg)  12/13/22 153 lb (69.4 kg)   Swallowing issues, if any: doing well with swallowing, saliva is still thick Smoking or chewing tobacco? Does not use Using fluoride toothpaste daily? no Last ENT visit was on: Dr. Irene Limbo on 12-26-22 Other notable issues, if any: concern for numbness in lip remains. Wants to know test results.   Vitals:   02/28/23 1432  BP: 134/78  Pulse: 76  Resp: 18  Temp: (!) 97.5 F (36.4 C)  SpO2: 100%      Impression & Plans:  Zachary Padilla is a 89 y.o. 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 Posttreatment PET scan showed response to therapy of right level 1A nodal metastasis with decreased to the lower lip soft tissue thickening. Exam today demonstrates excellent response to treatment, with minimal lymphedema in the submental region and no evidence of palpable cervical adenopathy. Patient was reassured. He is scheduled with dermatology in the near future for exam and Mohs excision of lesion of the left mid face. Emphasized the importance of daily SPF application, even if indoors. Patient is scheduled for follow-up CT of the neck in 4 months, I will plan on seeing him back in approximately 4-6 months for recheck.  Meghan Maretta Los, DO Otolaryngology

## 2023-02-15 ENCOUNTER — Ambulatory Visit (INDEPENDENT_AMBULATORY_CARE_PROVIDER_SITE_OTHER): Payer: Medicare Other | Admitting: Podiatry

## 2023-02-15 ENCOUNTER — Encounter: Payer: Self-pay | Admitting: Podiatry

## 2023-02-15 VITALS — Ht 70.0 in | Wt 153.0 lb

## 2023-02-15 DIAGNOSIS — M79675 Pain in left toe(s): Secondary | ICD-10-CM

## 2023-02-15 DIAGNOSIS — M79674 Pain in right toe(s): Secondary | ICD-10-CM | POA: Diagnosis not present

## 2023-02-15 DIAGNOSIS — B351 Tinea unguium: Secondary | ICD-10-CM | POA: Diagnosis not present

## 2023-02-15 NOTE — Progress Notes (Signed)
Subjective:   Patient ID: Zachary Padilla, male   DOB: 89 y.o.   MRN: [id number]   HPI Patient presents with thick yellow brittle nailbeds 1-5 both feet painful and impossible for him to cut   ROS      Objective:  Physical Exam  Neurovascular status intact yellow thick brittle nailbeds 1-5 both feet that are also painful     Assessment:  Chronic mycotic nail infection with pain 1-5 both feet     Plan:  Debridement painful nailbeds of both feet no iatrogenic bleeding noted

## 2023-02-17 ENCOUNTER — Telehealth: Payer: Self-pay

## 2023-02-17 NOTE — Telephone Encounter (Signed)
Pt called RN and asked about the safety of getting a Covid booster at this time. Rn stated this would be safe from the radiation oncology stand point. He also wanted to let Dr. Basilio Cairo know that Dr. Jeannine Boga recently removed a skin cancer on the side of his face (near his left eye). He stated he sees the md on Monday for follow up on this. Pt will also ask Dr. Jeannine Boga about the [employer] as well.

## 2023-02-24 ENCOUNTER — Ambulatory Visit (HOSPITAL_COMMUNITY)
Admission: RE | Admit: 2023-02-24 | Discharge: 2023-02-24 | Disposition: A | Discharge status: Home / Self Care | Payer: Medicare Other | Source: Ambulatory Visit | Attending: Radiology

## 2023-02-24 DIAGNOSIS — C770 Secondary and unspecified malignant neoplasm of lymph nodes of head, face and neck: Secondary | ICD-10-CM | POA: Insufficient documentation

## 2023-02-24 DIAGNOSIS — C801 Malignant (primary) neoplasm, unspecified: Secondary | ICD-10-CM | POA: Insufficient documentation

## 2023-02-24 DIAGNOSIS — H748X2 Other specified disorders of left middle ear and mastoid: Secondary | ICD-10-CM | POA: Insufficient documentation

## 2023-02-28 ENCOUNTER — Ambulatory Visit
Admission: RE | Admit: 2023-02-28 | Discharge: 2023-02-28 | Disposition: A | Discharge status: Home / Self Care | Payer: Medicare Other | Source: Ambulatory Visit | Attending: Radiation Oncology | Admitting: Radiation Oncology

## 2023-02-28 ENCOUNTER — Encounter: Payer: Self-pay | Admitting: Radiation Oncology

## 2023-02-28 VITALS — BP 134/78 | HR 76 | Temp 97.5°F | Resp 18 | Ht 70.0 in | Wt 157.4 lb

## 2023-02-28 DIAGNOSIS — E041 Nontoxic single thyroid nodule: Secondary | ICD-10-CM | POA: Insufficient documentation

## 2023-02-28 DIAGNOSIS — I890 Lymphedema, not elsewhere classified: Secondary | ICD-10-CM | POA: Insufficient documentation

## 2023-02-28 DIAGNOSIS — Z923 Personal history of irradiation: Secondary | ICD-10-CM | POA: Diagnosis not present

## 2023-02-28 DIAGNOSIS — Z8673 Personal history of transient ischemic attack (TIA), and cerebral infarction without residual deficits: Secondary | ICD-10-CM | POA: Insufficient documentation

## 2023-02-28 DIAGNOSIS — M5030 Other cervical disc degeneration, unspecified cervical region: Secondary | ICD-10-CM | POA: Insufficient documentation

## 2023-02-28 DIAGNOSIS — I7090 Unspecified atherosclerosis: Secondary | ICD-10-CM | POA: Diagnosis not present

## 2023-02-28 DIAGNOSIS — C770 Secondary and unspecified malignant neoplasm of lymph nodes of head, face and neck: Secondary | ICD-10-CM

## 2023-02-28 DIAGNOSIS — Z79899 Other long term (current) drug therapy: Secondary | ICD-10-CM | POA: Diagnosis not present

## 2023-02-28 DIAGNOSIS — Z85819 Personal history of malignant neoplasm of unspecified site of lip, oral cavity, and pharynx: Secondary | ICD-10-CM | POA: Diagnosis present

## 2023-02-28 NOTE — Progress Notes (Signed)
Radiation Oncology         [phone] ________________________________  Name: Zachary Padilla MRN: [id number]  Date: 02/28/2023  DOB: [birth year]  Follow-Up Visit Note  CC: Emilio Aspen, MD  [name], *  Diagnosis and Prior Radiotherapy:     No diagnosis found.  First Treatment Date: 2022-06-27 - Last Treatment Date: 2022-07-18  Plan Name: HN_nodes Site: Neck Technique: IMRT Mode: Photon Dose Per Fraction: 3 Gy Prescribed Dose (Delivered / Prescribed): 48 Gy / 48 Gy Prescribed Fxs (Delivered / Prescribed): 16 / 16   First Treatment Date: 2022-03-14 - Last Treatment Date: 2022-04-12  Plan Name: HN_LowLip_BO Site: Lip, Lower Technique: Electron Mode: Electron Dose Per Fraction: 2.5 Gy Prescribed Dose (Delivered / Prescribed): 50 Gy / 50 Gy Prescribed Fxs (Delivered / Prescribed): 20 / 20    CHIEF COMPLAINT:  Here for follow-up and surveillance of lip/neck cancer  Narrative:  The patient returns today for routine follow-up and to review his most recent PET scan.  Zachary Padilla presents to clinic today for follow up for completion of radiation therapy to his neck . He completed treatment on the 07-18-22.   CT of the neck on 02/24/2023 demonstrated post treatment changes without evidence of recurrent disease in the neck.   ***                     ALLERGIES:  is allergic to ace inhibitors, aminoglycosides, erythromycin, iodinated contrast media, levofloxacin, penicillins, sulfa antibiotics, and ciprofloxacin.  Meds: Current Outpatient Medications  Medication Sig Dispense Refill   cholecalciferol (VITAMIN D3) 25 MCG (1000 UNIT) tablet Take 1,000 Units by mouth daily.     furosemide (LASIX) 20 MG tablet Take 20 mg by mouth daily.     levETIRAcetam (KEPPRA) 500 MG tablet TAKE ONE-HALF TABLET IN THE MORNING AND TAKE ONE TABLET IN THE EVENING 135 tablet 3   SIMPLY SALINE NA Place 1 spray into the nose 2 (two) times daily.     vitamin B-12 (CYANOCOBALAMIN) 50  MCG tablet Take 50 mcg by mouth daily.     No current facility-administered medications for this visit.    Physical Findings:  The patient is in no acute distress. Patient is alert and oriented. Wt Readings from Last 3 Encounters:  02/15/23 153 lb (69.4 kg)  12/13/22 153 lb (69.4 kg)  10/27/22 156 lb (70.8 kg)    vitals were not taken for this visit. .  General: Alert and oriented, in no acute distress HEENT: Head is normocephalic. Extraocular movements are intact. No palpable sign of tumor recurrence throughout the inner and outer lip. No evidence of cancer recurrence in the lower lip or the within the oral cavity. Non-healing skin scabbing of the left nose.  Neck: Mild lymphedema in the anterior neck and submental region. Skin has healed very well from the radiation. Firmness in the submental region, but there is no obvious palpable adenopathy in that area. No palpable adenopathy throughout the rest of the neck.  Skin: Skin in treatment fields shows satisfactory healing   Lymphatics: see Neck Exam Psychiatric: Judgment and insight are intact. Affect is appropriate.   Lab Findings: Lab Results  Component Value Date   WBC 5.5 03/22/2016   HGB 14.3 03/22/2016   HCT 41.9 03/22/2016   MCV 96.5 03/22/2016   PLT 160 03/22/2016    Lab Results  Component Value Date   TSH 1.16 03/22/2016    Radiographic Findings: CT Soft Tissue Neck Wo Contrast  Result  Date: 02/27/2023 CLINICAL DATA:  Head/neck cancer, monitor. Squamous cell carcinoma of the lower lip. EXAM: CT NECK WITHOUT CONTRAST TECHNIQUE: Multidetector CT imaging of the neck was performed following the standard protocol without intravenous contrast. RADIATION DOSE REDUCTION: This exam was performed according to the departmental dose-optimization program which includes automated exposure control, adjustment of the mA and/or kV according to patient size and/or use of iterative reconstruction technique. COMPARISON:  PET-CT  10/24/2022.  Neck CT 04/08/2022. FINDINGS: Pharynx and larynx: No evidence of significant swelling or a mass on this unenhanced study. Widely patent airway. Salivary glands: No inflammation, mass, or stone. Thyroid: Unchanged 1.4 cm left thyroid nodule, previously evaluated by ultrasound and biopsy in 2013. Lymph nodes: Soft tissue thickening in the right submental region have the site of the previously treated metastatic lymph node, similar to the 10/24/2022 PET-CT. Associated skin retraction/scarring in this region without a discrete recurrent lymph node. No enlarged or suspicious lymph nodes elsewhere in the neck. Vascular: Minor atherosclerotic calcification at the carotid bifurcations. Limited intracranial: Brain atrophy. Chronic right cerebellar infarct. Visualized orbits: Bilateral cataract extraction. Mastoids and visualized paranasal sinuses: Prior functional endoscopic sinus surgery with mild-to-moderate scattered mucosal thickening bilaterally. Increased left mastoid fluid with new partial opacification of the left middle ear cavity. Skeleton: No suspicious bone lesion. Moderately advanced cervical disc degeneration. Upper chest: No mass or consolidation in the lung apices. Other: Low-density soft tissue thickening/swelling at and inferior to the lower lip, similar to the prior PET-CT. IMPRESSION: 1. Post treatment changes without evidence of recurrent disease in the neck. 2. Left mastoid and middle ear effusions. Electronically Signed   By: Sebastian Ache M.D.   On: 02/27/2023 11:01    Impression/Plan:  ***  1) Head and Neck Cancer Status: Healing well from radiation therapy to the upper neck. Recent PET is very favorable. We discussed his case at tumor board this morning and personally reviewed his images. We are very pleased with his response. ***  2) Nutritional Status: Eating well, weight is stable. *** PEG tube: None Wt Readings from Last 3 Encounters:  10/26/22 155 lb 9.6 oz (70.6 kg)   09/14/22 157 lb (71.2 kg)  08/05/22 161 lb 6.4 oz (73.2 kg)   4) Swallowing: Good function ***  5) Lymphedema - referral made to PT today. ***  6) Dental: Encouraged to continue regular followup with dentistry, and dental hygiene including fluoride rinses. ***  7) Thyroid function: Unlikely to be affected by radiation therapy *** Lab Results  Component Value Date   TSH 1.16 03/22/2016   8) Patient had a scab on the nose visible on physical exam today. He states this has been present for over a month. Our scheduling team is working to get him scheduled with dermatology. He knows to call and make an appointment if he doesn't hear anything soon. ***  9)  Patient will see Dr. Marene Lenz in 2 months for routine follow up. We will get a follow-up CT of his neck (wo contrast) in four months with a follow-up appointment with [place] to review the scan. ***  On date of service, in total, I spent 25 minutes on this encounter. Patient was seen in person. Note signed after encounter date; minutes pertain to date of service, only.  _____________________________________   Joyice Faster, PA-C   Lonie Peak, MD

## 2023-03-01 ENCOUNTER — Encounter: Payer: Self-pay | Admitting: Radiation Oncology

## 2023-03-15 NOTE — Progress Notes (Unsigned)
Cardiology Office Note    Date:  03/16/2023  ID:  Zachary Padilla, DOB Aug 25, 1928, MRN [id number] PCP:  Emilio Aspen, MD  Cardiologist:  Nanetta Batty, MD  Electrophysiologist:  None   Chief Complaint: Follow up for hypertension and dizziness  History of Present Illness: .    Zachary Padilla is a 89 y.o. male with visit-pertinent history of hypertension, hyperlipidemia, RBBB, thoracic aortic aneurysm and family history of CAD.  In 2013 he had a Myoview stress test that was normal.  Chest CT angiogram in November 2013 for evaluation of potential lung cancer incidentally revealed a 4.3 cm descending thoracic aortic aneurysm.  Echocardiogram in February 2023 showed EF 60 to 65%, normal LV function, moderate mitral valve regurgitation, mild aortic valve regurgitation, dilation of the aortic root and ascending aorta measuring 42 mm each.  Repeat echo in 05/2022 revealed EF 60 to 65%, normal LV function, no RWMA, G2 DD, normal RV systolic function, moderate mitral valve regurgitation, mild to moderate tricuspid valve rotation, mild aortic valve regurgitation, mild dilation of the ascending aorta measuring 43 mm, there is no significant change from prior study.  In 09/2022 he was seen by Dr. Allyson Sabal in clinic noting occasional dizziness occurring several times year without syncope.  He wore a cardiac monitor for 14 days which showed an average heart rate of 65 bpm, ranging from 41 bpm to 174 bpm.  Predominant underlying rhythm was sinus rhythm, first-degree AV block was present.  2 ventricular tachycardia runs occurred, fastest interval lasting 5 beats with a max rate of 174 bpm, run with the fastest interval was also the longest.  He had 1228 supraventricular tachycardia runs, run with the fastest interval lasting 4 beats with a max rate of 169 bpm.  He had a PVC burden of 11%, he was asymptomatic.  It was felt by Dr. Allyson Sabal that he did not need treatment given lack of symptoms.  Today he presents for  follow up.  He reports that he has been doing well, he has been treated for skin cancer on his face.  He denies any chest pain, shortness of breath, orthopnea, palpitations or PND.  He has stable nonpitting bilateral lower extremity edema.  He does exercises at home to increase his strength and mobility.  He has no cardiac concerns or complaints today.  ROS: .   Today he denies chest pain, shortness of breath, lower extremity edema, fatigue, palpitations, melena, hematuria, hemoptysis, diaphoresis, weakness, presyncope, syncope, orthopnea, and PND.  All other systems are reviewed and otherwise negative.  Studies Reviewed: Marland Kitchen    EKG:  EKG is ordered today, personally reviewed, demonstrating  EKG Interpretation Date/Time:  Thursday March 16 2023 15:19:33 EST Ventricular Rate:  66 PR Interval:  238 QRS Duration:  130 QT Interval:  414 QTC Calculation: 434 R Axis:   -37  Text Interpretation: Sinus rhythm with 1st degree A-V block with occasional Premature ventricular complexes Left axis deviation Right bundle branch block No significant change since last tracing Confirmed by Reather Littler [phone]) on 03/16/2023 4:01:54 PM   CV Studies:  Cardiac Studies & Procedures     STRESS TESTS  NM MYOCAR MULTI W/SPECT W 01/19/2012   ECHOCARDIOGRAM  ECHOCARDIOGRAM COMPLETE 05/11/2022  Narrative ECHOCARDIOGRAM REPORT    Patient Name:   Zachary Padilla  Date of Exam: 05/11/2022 Medical Rec #:  [id number]     Height:       70.0 in Accession #:    [id number]    Weight:  154.0 lb Date of Birth:  January 14, 1929     BSA:          1.868 m Patient Age:    89 years      BP:           157/88 mmHg Patient Gender: M             HR:           61 bpm. Exam Location:  [address]  Procedure: 2D Echo, Cardiac Doppler, Color Doppler and Strain Analysis  Indications:    I05.9 Mitral Valve disorder  History:        Patient has prior history of Echocardiogram examinations, most recent 06/09/2021. Arrythmias:RBBB;  Risk Factors:Hypertension, HLD and Sleep Apnea.  Sonographer:    Clearence Ped RCS Referring Phys: [phone] JONATHAN J BERRY  IMPRESSIONS   1. Left ventricular ejection fraction, by estimation, is 60 to 65%. The left ventricle has normal function. The left ventricle has no regional wall motion abnormalities. Left ventricular diastolic parameters are consistent with Grade II diastolic dysfunction (pseudonormalization). The average left ventricular global longitudinal strain is -19.9 %. The global longitudinal strain is normal. 2. Right ventricular systolic function is normal. The right ventricular size is normal. There is normal pulmonary artery systolic pressure. 3. Left atrial size was mildly dilated. 4. The mitral valve is normal in structure. Moderate mitral valve regurgitation. No evidence of mitral stenosis. 5. Tricuspid valve regurgitation is mild to moderate. 6. The aortic valve is tricuspid. Aortic valve regurgitation is mild. 7. Aortic dilatation noted. There is mild dilatation of the ascending aorta, measuring 43 mm. 8. The inferior vena cava is normal in size with greater than 50% respiratory variability, suggesting right atrial pressure of 3 mmHg.  Comparison(s): No significant change from prior study. Ascending aorta 43 mm (prior 42), MR unchanged.  FINDINGS Left Ventricle: Left ventricular ejection fraction, by estimation, is 60 to 65%. The left ventricle has normal function. The left ventricle has no regional wall motion abnormalities. The average left ventricular global longitudinal strain is -19.9 %. The global longitudinal strain is normal. The left ventricular internal cavity size was normal in size. There is no left ventricular hypertrophy. Left ventricular diastolic parameters are consistent with Grade II diastolic dysfunction (pseudonormalization).  Right Ventricle: The right ventricular size is normal. No increase in right ventricular wall thickness. Right ventricular  systolic function is normal. There is normal pulmonary artery systolic pressure. The tricuspid regurgitant velocity is 2.87 m/s, and with an assumed right atrial pressure of 3 mmHg, the estimated right ventricular systolic pressure is 35.9 mmHg.  Left Atrium: Left atrial size was mildly dilated.  Right Atrium: Right atrial size was normal in size.  Pericardium: There is no evidence of pericardial effusion.  Mitral Valve: PISA: MR radius 0.7 cm, ERO 0.21 cm2, volume 39 ml. The mitral valve is normal in structure. Moderate mitral valve regurgitation. No evidence of mitral valve stenosis.  Tricuspid Valve: The tricuspid valve is normal in structure. Tricuspid valve regurgitation is mild to moderate. No evidence of tricuspid stenosis.  Aortic Valve: The aortic valve is tricuspid. Aortic valve regurgitation is mild. Aortic regurgitation PHT measures 748 msec.  Pulmonic Valve: The pulmonic valve was grossly normal. Pulmonic valve regurgitation is mild. No evidence of pulmonic stenosis.  Aorta: Aortic dilatation noted. There is mild dilatation of the ascending aorta, measuring 43 mm.  Venous: The inferior vena cava is normal in size with greater than 50% respiratory variability, suggesting right atrial pressure  of 3 mmHg.  IAS/Shunts: The atrial septum is grossly normal.   LEFT VENTRICLE PLAX 2D LVIDd:         4.50 cm   Diastology LVIDs:         2.40 cm   LV e' medial:    5.33 cm/s LV PW:         0.90 cm   LV E/e' medial:  13.3 LV IVS:        0.90 cm   LV e' lateral:   4.03 cm/s LVOT diam:     1.80 cm   LV E/e' lateral: 17.5 LV SV:         54 LV SV Index:   29        2D Longitudinal Strain LVOT Area:     2.54 cm  2D Strain GLS (A2C):   -18.2 % 2D Strain GLS (A3C):   -22.9 % 2D Strain GLS (A4C):   -18.5 % 2D Strain GLS Avg:     -19.9 %  RIGHT VENTRICLE RV Basal diam:  3.60 cm RV S prime:     12.70 cm/s TAPSE (M-mode): 2.4 cm RVSP:           35.9 mmHg  LEFT ATRIUM              Index        RIGHT ATRIUM           Index LA diam:        3.30 cm 1.77 cm/m   RA Pressure: 3.00 mmHg LA Vol (A2C):   62.8 ml 33.62 ml/m  RA Area:     18.30 cm LA Vol (A4C):   47.2 ml 25.27 ml/m  RA Volume:   42.30 ml  22.65 ml/m LA Biplane Vol: 58.9 ml 31.53 ml/m AORTIC VALVE LVOT Vmax:   93.80 cm/s LVOT Vmean:  62.900 cm/s LVOT VTI:    0.212 m AI PHT:      748 msec  AORTA Ao Root diam: 4.10 cm Ao Asc diam:  4.30 cm  MITRAL VALVE                  TRICUSPID VALVE MV Area (PHT):                TR Peak grad:   32.9 mmHg MV Decel Time:                TR Vmax:        287.00 cm/s MR Peak grad:    85.0 mmHg    Estimated RAP:  3.00 mmHg MR Mean grad:    60.0 mmHg    RVSP:           35.9 mmHg MR Vmax:         461.00 cm/s MR Vmean:        366.0 cm/s   SHUNTS MR PISA:         3.08 cm     Systemic VTI:  0.21 m MR PISA Eff ROA: 21 mm       Systemic Diam: 1.80 cm MR PISA Radius:  0.70 cm MV E velocity: 70.70 cm/s MV A velocity: 80.70 cm/s MV E/A ratio:  0.88  Jodelle Red MD Electronically signed by Jodelle Red MD Signature Date/Time: 05/12/2022/7:09:19 AM    Final    MONITORS  LONG TERM MONITOR (3-14 DAYS) 10/21/2022  Narrative Patch Wear Time:  13 days and 23 hours (2024-05-24T11:04:01-399 to 2024-06-07T11:04:09-398)  Patient had a min  HR of 41 bpm, max HR of 174 bpm, and avg HR of 65 bpm. Predominant underlying rhythm was Sinus Rhythm. First Degree AV Block was present. Bundle Branch Block/IVCD was present. 2 Ventricular Tachycardia runs occurred, the run with the fastest interval lasting 5 beats with a max rate of 174 bpm (avg 139 bpm); the run with the fastest interval was also the longest. 1228 Supraventricular Tachycardia runs occurred, the run with the fastest interval lasting 4 beats with a max rate of 169 bpm, the longest lasting 19.3 secs with an avg rate of 100 bpm. Isolated SVEs were rare (<1.0%), SVE Couplets were rare (<1.0%), and SVE Triplets  were rare (<1.0%). Isolated VEs were frequent (10.8%, [id number]), VE Couplets were occasional (1.3%, 8629), and VE Triplets were rare (<1.0%, 123). Ventricular Bigeminy and Trigeminy were present.  SR/SB/ST Occasional PACs, Freq PVCs (10.8% burden) Short runs of SVT (1228) Short runs of NSVT            Current Reported Medications:.    Current Meds  Medication Sig   cholecalciferol (VITAMIN D3) 25 MCG (1000 UNIT) tablet Take 1,000 Units by mouth daily.   furosemide (LASIX) 20 MG tablet Take 20 mg by mouth daily.   levETIRAcetam (KEPPRA) 500 MG tablet TAKE ONE-HALF TABLET IN THE MORNING AND TAKE ONE TABLET IN THE EVENING   SIMPLY SALINE NA Place 1 spray into the nose 2 (two) times daily.   vitamin B-12 (CYANOCOBALAMIN) 50 MCG tablet Take 50 mcg by mouth daily.   Physical Exam:    VS:  BP 130/74 (BP Location: Left Arm, Patient Position: Sitting, Cuff Size: Normal)   Pulse 66   Resp 16   Ht 5\' 10"  (1.778 m)   Wt 155 lb 3.2 oz (70.4 kg)   SpO2 93%   BMI 22.27 kg/m    Wt Readings from Last 3 Encounters:  03/16/23 155 lb 3.2 oz (70.4 kg)  02/28/23 157 lb 6 oz (71.4 kg)  02/15/23 153 lb (69.4 kg)    GEN: Well nourished, well developed in no acute distress NECK: No JVD; No carotid bruits CARDIAC: RRR, no murmurs, rubs, gallops RESPIRATORY:  Clear to auscultation without rales, wheezing or rhonchi  ABDOMEN: Soft, non-tender, non-distended EXTREMITIES:  mild bilateral nonpitting lower extremity edema; No acute deformity   Asessement and Plan:Marland Kitchen    Hypertension: Blood pressure today 130/74. Continue lasix 20 mg daily.   Thoracic aortic aneurysm: History of thoracic aortic aneurysm, measuring 43 mm by 2D echo on 05/11/2022. Discussed with patient that given his age and lack of symptoms no need for continued monitoring, however patient would prefer to continue monitoring at this time. Check echocardiogram.   Moderate mitral valve regurgitation: History of moderate mitral regurgitation by  2D echocardiogram, most recently noted on echo 05/2022 with normal LV function.  Discussed with patient that given his age and lack of symptoms no need for continued monitoring, however patient would prefer to continue monitoring at this time.   Bilateral lower extremity edema: Echo in 05/2022 indicated EF 60 to 65%, normal LV function, no RWMA, G2 DD, normal RV systolic function.  No significant change from prior study.  He has stable nonpitting bilateral ankle edema, he is otherwise overall euvolemic and well compensated on exam.  Continue Lasix.  Dizziness: Patient has history of intermittent dizziness without syncope. He wore a cardiac monitor for 14 days which showed an average heart rate of 65 bpm, ranging from 41 bpm to 174 bpm.  Predominant underlying rhythm  was sinus rhythm, first-degree AV block was present.  2 ventricular tachycardia runs occurred, fastest interval lasting 5 beats with a max rate of 174 bpm, run with the fastest interval was also the longest.  He had 1228 supraventricular tachycardia runs, run with the fastest interval lasting 4 beats with a max rate of 169 bpm.  He had a PVC burden of 11%, he was asymptomatic.  It was felt by Dr. Allyson Sabal that he did not need treatment given lack of symptoms. Today he reports improvement in intermittent dizziness since starting vitamin B12 and D3.   RBBB: Chronic.   Hyperlipidemia: He has a documented history of hyperlipidemia, no recent LDL on file.  Monitored and managed per PCP.  CKD stage III: Patient reports that he will have his annual physical in January with his PCP, he plans to have labs completed at that time.  Disposition: F/u with Dr. Allyson Sabal in 6 months.   Signed, Rip Harbour, NP

## 2023-03-16 ENCOUNTER — Encounter: Payer: Self-pay | Admitting: Cardiology

## 2023-03-16 ENCOUNTER — Ambulatory Visit: Payer: Medicare Other | Attending: Cardiology | Admitting: Cardiology

## 2023-03-16 VITALS — BP 130/74 | HR 66 | Resp 16 | Ht 70.0 in | Wt 155.2 lb

## 2023-03-16 DIAGNOSIS — I7121 Aneurysm of the ascending aorta, without rupture: Secondary | ICD-10-CM | POA: Diagnosis not present

## 2023-03-16 DIAGNOSIS — I340 Nonrheumatic mitral (valve) insufficiency: Secondary | ICD-10-CM | POA: Diagnosis not present

## 2023-03-16 DIAGNOSIS — R600 Localized edema: Secondary | ICD-10-CM | POA: Diagnosis not present

## 2023-03-16 DIAGNOSIS — I10 Essential (primary) hypertension: Secondary | ICD-10-CM | POA: Diagnosis not present

## 2023-03-16 DIAGNOSIS — I4510 Unspecified right bundle-branch block: Secondary | ICD-10-CM

## 2023-03-16 DIAGNOSIS — R42 Dizziness and giddiness: Secondary | ICD-10-CM

## 2023-03-16 DIAGNOSIS — N183 Chronic kidney disease, stage 3 unspecified: Secondary | ICD-10-CM

## 2023-03-16 NOTE — Patient Instructions (Signed)
Medication Instructions:  Your physician recommends that you continue on your current medications as directed. Please refer to the Current Medication list given to you today.  *If you need a refill on your cardiac medications before your next appointment, please call your pharmacy*  Lab Work: None  Testing/Procedures: Your physician has requested that you have an echocardiogram in January 2025. Echocardiography is a painless test that uses sound waves to create images of your heart. It provides your doctor with information about the size and shape of your heart and how well your heart's chambers and valves are working. This procedure takes approximately one hour. There are no restrictions for this procedure. Please do NOT wear cologne, perfume, aftershave, or lotions (deodorant is allowed). Please arrive 15 minutes prior to your appointment time. This will take place at [address]    Follow-Up: At [facility], you and your health needs are our priority.  As part of our continuing mission to provide you with exceptional heart care, we have created designated Provider Care Teams.  These Care Teams include your primary Cardiologist (physician) and Advanced Practice Providers (APPs -  Physician Assistants and Nurse Practitioners) who all work together to provide you with the care you need, when you need it.  We recommend signing up for the patient portal called "MyChart".  Sign up information is provided on this After Visit Summary.  MyChart is used to connect with patients for Virtual Visits (Telemedicine).  Patients are able to view lab/test results, encounter notes, upcoming appointments, etc.  Non-urgent messages can be sent to your provider as well.   To learn more about what you can do with MyChart, go to [email or web address].    Your next appointment:   6 month(s)  Provider:   Nanetta Batty, MD

## 2023-04-11 ENCOUNTER — Ambulatory Visit: Payer: Medicare Other | Admitting: Adult Health

## 2023-05-16 ENCOUNTER — Ambulatory Visit (HOSPITAL_COMMUNITY): Payer: Medicare Other | Attending: Cardiology

## 2023-05-16 DIAGNOSIS — I10 Essential (primary) hypertension: Secondary | ICD-10-CM | POA: Insufficient documentation

## 2023-05-16 DIAGNOSIS — I340 Nonrheumatic mitral (valve) insufficiency: Secondary | ICD-10-CM | POA: Insufficient documentation

## 2023-05-16 DIAGNOSIS — I7121 Aneurysm of the ascending aorta, without rupture: Secondary | ICD-10-CM | POA: Diagnosis present

## 2023-05-16 LAB — ECHOCARDIOGRAM COMPLETE
Area-P 1/2: 2.85 cm2
P 1/2 time: 1014 ms
S' Lateral: 3 cm

## 2023-05-18 ENCOUNTER — Telehealth: Payer: Self-pay

## 2023-05-18 NOTE — Telephone Encounter (Signed)
-----   Message from Katlyn D West sent at 05/18/2023 12:09 PM EST ----- Please let [name] know that his heart squeeze and function is normal. There is some mild stiffening of the left lower chamber of the heart. There is mild dilation of the aorta that is stable. Good result!

## 2023-05-18 NOTE — Telephone Encounter (Signed)
 Called patient advised of below they verbalized understanding.

## 2023-06-13 ENCOUNTER — Ambulatory Visit: Payer: Medicare Other | Admitting: Podiatry

## 2023-06-15 ENCOUNTER — Encounter: Payer: Self-pay | Admitting: Podiatry

## 2023-06-15 ENCOUNTER — Ambulatory Visit (INDEPENDENT_AMBULATORY_CARE_PROVIDER_SITE_OTHER): Payer: Medicare Other | Admitting: Podiatry

## 2023-06-15 VITALS — Ht 70.0 in

## 2023-06-15 DIAGNOSIS — B351 Tinea unguium: Secondary | ICD-10-CM | POA: Diagnosis not present

## 2023-06-15 DIAGNOSIS — M79675 Pain in left toe(s): Secondary | ICD-10-CM

## 2023-06-15 DIAGNOSIS — L608 Other nail disorders: Secondary | ICD-10-CM | POA: Diagnosis not present

## 2023-06-15 DIAGNOSIS — M79674 Pain in right toe(s): Secondary | ICD-10-CM | POA: Diagnosis not present

## 2023-06-15 NOTE — Progress Notes (Signed)
 Patient presents to the office concerned about both big toenails.  He says they are painful in shoes. He presents for evaluation and treatment  General Appearance  Alert, conversant and in no acute stress.  Vascular  Dorsalis pedis and posterior tibial  pulses are weakly palpable  bilaterally.  Capillary return is within normal limits  bilaterally. Temperature is within normal limits  bilaterally.  Neurologic  Senn-Weinstein monofilament wire test within normal limits  bilaterally. Muscle power within normal limits bilaterally.  Nails Thick disfigured discolored nails with subungual debris  from hallux to fifth toes bilaterally. No evidence of bacterial infection or drainage bilaterally. Pincer hallux nails  B/L.  Orthopedic  No limitations of motion  feet .  No crepitus or effusions noted.  No bony pathology or digital deformities noted.  Skin  normotropic skin with no porokeratosis noted bilaterally.  No signs of infections or ulcers noted.     Pincer nails  Discussed condition with patient.  Told him his nail borders are growing into the nail grooves due to his pincer toanil deformity.  RTC 10 weeks  Ruffin Cotton DPM

## 2023-07-28 LAB — LAB REPORT - SCANNED
Creatinine, POC: 32.7 mg/dL
EGFR: 48

## 2023-09-01 NOTE — Progress Notes (Signed)
 Mr. Lordan presents to the clinic today for a 6 month follow up. He completed radiation therapy for Secondary and Unspecified Malignant neoplasm of Lymph nodes of head, face and Neck on 07/18/2022.  Patient says he is doing well after radiation. He says he feels fatigue often   Pain issues, if any: Denies but feels pressure while smiling.  Using a feeding tube?: None Weight changes, if any: Patient gained a little bit of weight. Swallowing issues, if any: Patient denies  Smoking or chewing tobacco? Patient denies Using fluoride toothpaste daily? Yes Last ENT visit was on: December 2024. Other notable issues, if any:  None Wt Readings from Last 3 Encounters:  09/05/23 157 lb 3.2 oz (71.3 kg)  03/16/23 155 lb 3.2 oz (70.4 kg)  02/28/23 157 lb 6 oz (71.4 kg)

## 2023-09-05 ENCOUNTER — Ambulatory Visit
Admission: RE | Admit: 2023-09-05 | Discharge: 2023-09-05 | Disposition: A | Discharge status: Home / Self Care | Payer: Self-pay | Source: Ambulatory Visit | Attending: Radiation Oncology | Admitting: Radiation Oncology

## 2023-09-05 ENCOUNTER — Ambulatory Visit
Admission: RE | Admit: 2023-09-05 | Discharge: 2023-09-05 | Disposition: A | Discharge status: Home / Self Care | Source: Ambulatory Visit | Attending: Radiation Oncology | Admitting: Radiation Oncology

## 2023-09-05 ENCOUNTER — Other Ambulatory Visit: Payer: Self-pay

## 2023-09-05 VITALS — BP 145/75 | HR 54 | Temp 97.8°F | Resp 20 | Ht 70.0 in | Wt 157.2 lb

## 2023-09-05 DIAGNOSIS — Z79899 Other long term (current) drug therapy: Secondary | ICD-10-CM | POA: Insufficient documentation

## 2023-09-05 DIAGNOSIS — Z85828 Personal history of other malignant neoplasm of skin: Secondary | ICD-10-CM | POA: Insufficient documentation

## 2023-09-05 DIAGNOSIS — C770 Secondary and unspecified malignant neoplasm of lymph nodes of head, face and neck: Secondary | ICD-10-CM

## 2023-09-05 DIAGNOSIS — R5383 Other fatigue: Secondary | ICD-10-CM | POA: Diagnosis not present

## 2023-09-05 DIAGNOSIS — Z923 Personal history of irradiation: Secondary | ICD-10-CM | POA: Insufficient documentation

## 2023-09-05 DIAGNOSIS — I890 Lymphedema, not elsewhere classified: Secondary | ICD-10-CM | POA: Insufficient documentation

## 2023-09-05 DIAGNOSIS — Z85819 Personal history of malignant neoplasm of unspecified site of lip, oral cavity, and pharynx: Secondary | ICD-10-CM | POA: Insufficient documentation

## 2023-09-05 NOTE — Progress Notes (Signed)
 Radiation Oncology         [phone] ________________________________  Name: Niquan Defina MRN: [id number]  Date: 09/05/2023  DOB: 1928/06/03  Follow-Up Visit Note  CC: Benedetta Bradley, MD  Charlene Conners A, *  Diagnosis and Prior Radiotherapy:       ICD-10-CM   1. 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.0 Amb Referral to Survivorship Program    Ambulatory referral to Physical Therapy      First Treatment Date: 2022-06-27 - Last Treatment Date: 2022-07-18  Plan Name: HN_nodes Site: Neck Technique: IMRT Mode: Photon Dose Per Fraction: 3 Gy Prescribed Dose (Delivered / Prescribed): 48 Gy / 48 Gy Prescribed Fxs (Delivered / Prescribed): 16 / 16   First Treatment Date: 2022-03-14 - Last Treatment Date: 2022-04-12  Plan Name: HN_LowLip_BO Site: Lip, Lower Technique: Electron Mode: Electron Dose Per Fraction: 2.5 Gy Prescribed Dose (Delivered / Prescribed): 50 Gy / 50 Gy Prescribed Fxs (Delivered / Prescribed): 20 / 20    CHIEF COMPLAINT:  Here for follow-up and surveillance of lip/neck cancer  Narrative:  [name] presents to clinic today for follow up for completion of radiation therapy to his neck . He completed treatment on the 07-18-22.   Since we last saw him, he met with Dr. Westley Hammers on 04/27/2023. No evidence of re-occurrence was noted at that time.    Pain issues, if any: Denies but feels pressure while smiling.  Using a feeding tube?: None Weight changes, if any: Patient gained a little bit of weight. Swallowing issues, if any: Patient denies  Smoking or chewing tobacco? Patient denies Using fluoride toothpaste daily? Yes Last ENT visit was on: December 2024. Other notable issues, if any:  None    Wt Readings from Last 3 Encounters:  09/05/23 157 lb 3.2 oz (71.3 kg)  03/16/23 155 lb 3.2 oz (70.4 kg)  02/28/23 157 lb 6 oz (71.4 kg)    Vitals:   09/05/23 1352  BP: (!) 145/75  Pulse: (!) 54  Resp: 20  Temp: 97.8 F  (36.6 C)  SpO2: 100%     ALLERGIES:  is allergic to ace inhibitors, aminoglycosides, erythromycin, iodinated contrast media, levofloxacin, penicillins, sulfa antibiotics, and ciprofloxacin.  Meds: Current Outpatient Medications  Medication Sig Dispense Refill   cholecalciferol (VITAMIN D3) 25 MCG (1000 UNIT) tablet Take 1,000 Units by mouth daily.     furosemide (LASIX) 20 MG tablet Take 20 mg by mouth daily.     levETIRAcetam  (KEPPRA ) 500 MG tablet TAKE ONE-HALF TABLET IN THE MORNING AND TAKE ONE TABLET IN THE EVENING 135 tablet 3   SIMPLY SALINE NA Place 1 spray into the nose 2 (two) times daily.     vitamin B-12 (CYANOCOBALAMIN) 50 MCG tablet Take 50 mcg by mouth daily.     fluorouracil (EFUDEX) 5 % cream Apply topically. (Patient not taking: Reported on 09/05/2023)     No current facility-administered medications for this encounter.    Physical Findings:  The patient is in no acute distress. Patient is alert and oriented. Wt Readings from Last 3 Encounters:  09/05/23 157 lb 3.2 oz (71.3 kg)  03/16/23 155 lb 3.2 oz (70.4 kg)  02/28/23 157 lb 6 oz (71.4 kg)    height is 5\' 10"  (1.778 m) and weight is 157 lb 3.2 oz (71.3 kg). His temperature is 97.8 F (36.6 C). His blood pressure is 145/75 (abnormal) and his pulse is 54 (abnormal). His respiration is 20 and oxygen saturation is 100%. [name]  General: Alert and oriented, in no acute distress HEENT: Head is normocephalic. Extraocular movements are intact. No palpable sign of tumor recurrence throughout the inner and outer lip. No evidence of cancer recurrence in the lower lip or the within the oral cavity.   Neck: Mild lymphedema in the anterior neck and submental region. Skin has healed very well from the radiation. No obvious palpable adenopathy in submental region or throughout the rest of the neck.  Skin: Skin in treatment fields shows satisfactory healing   Lymphatics: see Neck Exam Psychiatric: Judgment and insight are intact.  Affect is appropriate.   Lab Findings: Lab Results  Component Value Date   WBC 5.5 03/22/2016   HGB 14.3 03/22/2016   HCT 41.9 03/22/2016   MCV 96.5 03/22/2016   PLT 160 03/22/2016    Lab Results  Component Value Date   TSH 1.16 03/22/2016    Radiographic Findings: No results found.  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  Wt Readings from Last 3 Encounters:  09/05/23 157 lb 3.2 oz (71.3 kg)  03/16/23 155 lb 3.2 oz (70.4 kg)  02/28/23 157 lb 6 oz (71.4 kg)    4) Swallowing: Good function   5) Lymphedema - PT has helped in the past, but he is unable to remember his exercises. Referral placed to PT today.   6) Dental: Encouraged to continue regular followup with dentistry, and dental hygiene including fluoride rinses.   7) Thyroid  function: Unlikely to be affected by radiation therapy. Patient is experiencing increasing fatigue so we will obtain TSH today.  Lab Results  Component Value Date   TSH 1.16 03/22/2016   8)  Continue to see Dr Gideon Kussmaul for other early stage skin cancers  9)  Patient will see Dr. Westley Hammers for routine follow up in 4 mos. Patient will be seen in our survivorship clinic in 8 months. Imaging in the future as clinically indicated.    On date of service, in total, I spent 30 minutes on this encounter. Patient was seen in person. Note signed after encounter date; minutes pertain to date of service, only.  _____________________________________   Julio Ohm, PA-C   Colie Dawes, MD    [facility]  Radiation Oncology Direct Dial: [phone]  Fax: [phone] [email or web address]

## 2023-09-06 LAB — TSH: TSH: 1.76 u[IU]/mL (ref 0.350–4.500)

## 2023-09-11 ENCOUNTER — Encounter: Payer: Self-pay | Admitting: *Deleted

## 2023-09-14 ENCOUNTER — Encounter: Payer: Self-pay | Admitting: Podiatry

## 2023-09-14 ENCOUNTER — Ambulatory Visit (INDEPENDENT_AMBULATORY_CARE_PROVIDER_SITE_OTHER): Payer: Medicare Other | Admitting: Podiatry

## 2023-09-14 DIAGNOSIS — M79674 Pain in right toe(s): Secondary | ICD-10-CM | POA: Diagnosis not present

## 2023-09-14 DIAGNOSIS — M79675 Pain in left toe(s): Secondary | ICD-10-CM | POA: Diagnosis not present

## 2023-09-14 DIAGNOSIS — L608 Other nail disorders: Secondary | ICD-10-CM

## 2023-09-14 DIAGNOSIS — B351 Tinea unguium: Secondary | ICD-10-CM | POA: Diagnosis not present

## 2023-09-14 NOTE — Progress Notes (Signed)
 Patient presents to the office concerned about both big toenails.  He says they are painful in shoes. He presents for evaluation and treatment  General Appearance  Alert, conversant and in no acute stress.  Vascular  Dorsalis pedis and posterior tibial  pulses are weakly palpable  bilaterally.  Capillary return is within normal limits  bilaterally. Temperature is within normal limits  bilaterally.  Neurologic  Senn-Weinstein monofilament wire test within normal limits  bilaterally. Muscle power within normal limits bilaterally.  Nails Thick disfigured discolored nails with subungual debris  from hallux to fifth toes bilaterally. No evidence of bacterial infection or drainage bilaterally. Pincer hallux nails  B/L.  Orthopedic  No limitations of motion  feet .  No crepitus or effusions noted.  No bony pathology or digital deformities noted.  Skin  normotropic skin with no porokeratosis noted bilaterally.  No signs of infections or ulcers noted.     Pincer nails  Discussed condition with patient.  Told him his nail borders are growing into the nail grooves due to his pincer toanil deformity.  RTC 10 weeks  Zachary Padilla DPM

## 2023-09-15 ENCOUNTER — Ambulatory Visit

## 2023-09-21 ENCOUNTER — Ambulatory Visit: Admitting: Physical Therapy

## 2023-09-21 NOTE — Therapy (Incomplete)
 OUTPATIENT PHYSICAL THERAPY HEAD AND NECK BASELINE EVALUATION   Patient Name: Zachary Padilla MRN: [id number] DOB:07-23-1928, 89 y.o., male Today's Date: 09/21/2023  END OF SESSION:   Past Medical History:  Diagnosis Date   ACNE ROSACEA 09/09/2009   ALLERGIC RHINITIS 02/23/2009   Basal cell carcinoma 06/23/2010   R cheek- (CX35FU+ exc )   COLONIC POLYPS, HX OF 02/23/2009   Convulsions/seizures (HCC) 10/24/2014   DIVERTICULITIS, HX OF 02/23/2009   Ear infection    Family history of heart disease    GERD 02/23/2009   Hyperlipidemia    HYPERTENSION 02/23/2009   PONV (postoperative nausea and vomiting)    Posterior vitreous detachment of right eye 08/26/2019   Pre-syncope    Thoracic aortic aneurysm (HCC)    URINARY INCONTINENCE 02/23/2009   Past Surgical History:  Procedure Laterality Date   APPENDECTOMY  1946   CATARACT EXTRACTION Bilateral    EYE SURGERY Right 07/25/2019   Left ear tube  2001   Removal of pre-cancer bump on neck  2005   Ruptured appendics  1946   Sinus surgery  2008   Patient Active Problem List   Diagnosis Date Noted   Dizziness 09/14/2022   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   Preoperative clearance 06/08/2022   Pain due to onychomycosis of toenails of both feet 03/08/2022   Cancer of lower lip, 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   Vitreous floater, right 11/18/2021   Macular hole of right eye 09/29/2021   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 09/29/2021   Moderate mitral regurgitation 07/06/2021   Lower extremity edema 07/06/2021   Cystoid macular edema of left eye 04/20/2021   Macular pucker, left eye 04/14/2020   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   Sleep apnea, obstructive 12/02/2019   Status post eye surgery 09/04/2019   Superficial keratitis with conjunctivitis, right 08/29/2019   Right epiretinal membrane 08/26/2019   Retinal telangiectasia of right  eye 08/26/2019   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   Chronic otitis media 08/25/2015   Sensorineural hearing loss 08/25/2015   Convulsions/seizures (HCC) 10/24/2014   Hyperlipidemia 02/13/2013   Thoracic aortic aneurysm (HCC) 02/13/2013   Right bundle branch block 02/13/2013   Nonspecific abnormal electrocardiogram (ECG) (EKG) 01/02/2012   ACNE ROSACEA 09/09/2009   Essential hypertension 02/23/2009   ALLERGIC RHINITIS 02/23/2009   GERD 02/23/2009   URINARY INCONTINENCE 02/23/2009   History of colonic polyps 02/23/2009   DIVERTICULITIS, HX OF 02/23/2009    PCP: ***  REFERRING PROVIDER: ***  REFERRING DIAG: ***  THERAPY DIAG:  No diagnosis found.  Rationale for Evaluation and Treatment: Rehabilitation  ONSET DATE: 07/2022  SUBJECTIVE:     SUBJECTIVE STATEMENT: Patient reports they are here today to be seen by their medical team for newly diagnosed cancer of lower lip with recurrent malignancy in 2024. 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 05/2022 had recurrent malignancy. Pt was offered neck dissection but opted for repeat radiation which was completed on 07/18/2022. Pt had prior PT 11/15/2022, and completed on 02/01/2024.  PATIENT GOALS:   to be educated about the signs and symptoms of lymphedema and learn  HEP.   PAIN:  Are you having pain? {OPRCPAIN:27236}  PRECAUTIONS: {PTprecuations:27016}  RED FLAGS: {PT Red Flags:29287}   WEIGHT BEARING RESTRICTIONS: No  FALLS:  Has patient fallen in last 6 months? {  fallsyesno:27318} Does the patient have a fear of falling that limits activity? {Yes/No:304960894} Is the patient reluctant to leave the house due to a fear of falling?{ZOX/WR:604540981}  LIVING ENVIRONMENT: Patient lives with:wife Lives in: House/apartment   OCCUPATION: retired  LEISURE: foot bike, arm exercises  PRIOR LEVEL OF FUNCTION: Independent   OBJECTIVE: Note:  Objective measures were completed at Evaluation unless otherwise noted.  COGNITION: Overall cognitive status: Within functional limits for tasks assessed                  POSTURE:  Forward head and rounded shoulders posture  30 SEC SIT TO STAND: *** reps in 30 sec {With-without:32421} use of UEs which is  {Excellent/good/avg/belowavg/poor:27017} for patient's age  SHOULDER AROM:   {WFL/IMPAIRED:27018}   CERVICAL AROM:   Percent limited  Flexion   Extension   Right lateral flexion   Left lateral flexion   Right rotation   Left rotation     (Blank rows=not tested)    LYMPHEDEMA ASSESSMENT:      Circumference in cm  4 cm superior to sternal notch around neck   6 cm superior to sternal notch around neck   8 cm superior to sternal notch around neck   R lateral nostril from base of nose to medial tragus    L lateral nostril from base of nose to medial tragus    R corner of mouth to where ear lobe meets face   L corner of mouth to where ear lobe meets face             (Blank rows=not tested)        GAIT: Assessed: {yes/no:20286} Assistance needed: {XBJ:4782956} Ambulation Distance: *** feet Assistive Device: *** Gait pattern: {PTgaitpattern:27019} Ambulation surface: Level  PATIENT EDUCATION:  Education details: Neck ROM, importance of posture when sitting,  Education details: anatomy and physiology of the lymphatic system, need for MLD and compression, how to obtain Tribute head and neck garment Person educated: Patient Education method: Programmer, multimedia, Demonstration, Handout Education comprehension: Patient verbalized understanding HOME EXERCISE PROGRAM: Patient was instructed today in a home exercise program today for head and neck range of motion exercises. These included active cervical flexion, active cervical extension, active cervical rotation to each direction, upper trap stretch, and shoulder retraction. Patient was encouraged to do these 2-3 times a day,  holding for 5 sec each and completing for 5 reps. Pt was educated that once this becomes easier then hold the stretches for 30-60 seconds.    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 He completed radiation on 07/18/22 for secondary malignant neoplasm of lymph nodes of neck. He had prior radiation for squamous cell carcinoma of lip and completed on 04/12/22. Educated pt on anatomy and physiology of lymphatic system and how edema can place him at an increased risk of infection. Pt would benefit from skilled PT services to decrease neck and mandibular lymphedema and instruct pt on independent management.    Pt will benefit from skilled therapeutic intervention to improve on the following deficits: decreased knowledge of condition, decreased knowledge of use of DME, decreased ROM, increased edema, increased fascial restrictions, and postural dysfunction   PT treatment/interventions: ADL/self-care home management, pt/family education, therapeutic exercise. Other interventions Therapeutic exercises, Therapeutic activity, Patient/Family education, Self Care, Orthotic/Fit training, Manual lymph drainage, Compression , Taping, Vasopneumatic device, Manual therapy, and Re-evaluation      REHAB POTENTIAL: Good  CLINICAL DECISION MAKING: Stable/uncomplicated  EVALUATION COMPLEXITY: Low  GOALS: Goals reviewed with patient? YES  LONG TERM GOALS: (STG=LTG)   Name Target Date  Goal status  1 Patient will be able to verbalize understanding of a home exercise program for cervical range of motion, posture, and walking.   Baseline:  No knowledge 09/21/2023 Achieved at eval  2  Pt will be independent in self MLD for long term management of lymphedema.  09/21/2023 Achieved at eval  3 Pt will demonstrate Decrease in edema   09/21/2023 Achieved at eval  4    *** New    PLAN:  PT FREQUENCY/DURATION:   PLAN FOR NEXT SESSION:  begin MLD using Norton  anterior approach, review with pt eventually,        [employer], PT 09/21/2023, 10:59 AM

## 2023-09-26 NOTE — Therapy (Signed)
 OUTPATIENT PHYSICAL THERAPY HEAD AND NECK EVALUATION   Patient Name: Zachary Padilla MRN: [id number] DOB:1928/07/10, 89 y.o., male Today's Date: 09/27/2023  END OF SESSION:  PT End of Session - 09/27/23 1726     Visit Number 1    Number of Visits 9    Date for PT Re-Evaluation 11/08/23    Authorization Type [employer]    PT Start Time 1400    PT Stop Time 1500    PT Time Calculation (min) 60 min    Activity Tolerance Patient tolerated treatment well    Behavior During Therapy WFL for tasks assessed/performed             Past Medical History:  Diagnosis Date   ACNE ROSACEA 09/09/2009   ALLERGIC RHINITIS 02/23/2009   Basal cell carcinoma 06/23/2010   R cheek- (CX35FU+ exc )   COLONIC POLYPS, HX OF 02/23/2009   Convulsions/seizures (HCC) 10/24/2014   DIVERTICULITIS, HX OF 02/23/2009   Ear infection    Family history of heart disease    GERD 02/23/2009   Hyperlipidemia    HYPERTENSION 02/23/2009   PONV (postoperative nausea and vomiting)    Posterior vitreous detachment of right eye 08/26/2019   Pre-syncope    Thoracic aortic aneurysm (HCC)    URINARY INCONTINENCE 02/23/2009   Past Surgical History:  Procedure Laterality Date   APPENDECTOMY  1946   CATARACT EXTRACTION Bilateral    EYE SURGERY Right 07/25/2019   Left ear tube  2001   Removal of pre-cancer bump on neck  2005   Ruptured appendics  1946   Sinus surgery  2008   Patient Active Problem List   Diagnosis Date Noted   Dizziness 09/14/2022   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   Preoperative clearance 06/08/2022   Pain due to onychomycosis of toenails of both feet 03/08/2022   Cancer of lower lip, 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   Vitreous floater, right 11/18/2021   Macular hole of right eye 09/29/2021   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 09/29/2021   Moderate mitral regurgitation 07/06/2021   Lower extremity edema 07/06/2021    Cystoid macular edema of left eye 04/20/2021   Macular pucker, left eye 04/14/2020   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   Sleep apnea, obstructive 12/02/2019   Status post eye surgery 09/04/2019   Superficial keratitis with conjunctivitis, right 08/29/2019   Right epiretinal membrane 08/26/2019   Retinal telangiectasia of right eye 08/26/2019   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   Chronic otitis media 08/25/2015   Sensorineural hearing loss 08/25/2015   Convulsions/seizures (HCC) 10/24/2014   Hyperlipidemia 02/13/2013   Thoracic aortic aneurysm (HCC) 02/13/2013   Right bundle branch block 02/13/2013   Nonspecific abnormal electrocardiogram (ECG) (EKG) 01/02/2012   ACNE ROSACEA 09/09/2009   Essential hypertension 02/23/2009   ALLERGIC RHINITIS 02/23/2009   GERD 02/23/2009   URINARY INCONTINENCE 02/23/2009   History of colonic polyps 02/23/2009   DIVERTICULITIS, HX OF 02/23/2009    PCP:   REFERRING PROVIDER: [facility]  REFERRING DIAG: Malignant neoplasm of neck  THERAPY DIAG:  Secondary malignant neoplasm of cervical lymph node (HCC)  Lymphedema, not elsewhere classified  Abnormal posture  Rationale for Evaluation and Treatment: Rehabilitation  ONSET DATE: 07/2022  SUBJECTIVE:     SUBJECTIVE STATEMENT:  .I went back to see the doctor and she suggested that  I come back for therapy.My neck swelling comes and goes at various times.  He hasn't done the MLD recently because of the Mohs procedures he has had that healed in February. He brought his neck garment and handouts with him to review.  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 05/2022 had recurrent malignancy. Pt was offered neck dissection but opted for repeat radiation which was completed on 07/18/2022. Pt had prior PT 11/15/2022, and completed on 02/01/2024.  PATIENT GOALS:   to be educated about the signs and symptoms  of lymphedema and learn  HEP.   PAIN:  Are you having pain? No  PRECAUTIONS: thoracic aneurysm, incontinence,cervical lymphedema  RED FLAGS: Bowel or bladder incontinence: Yes:   and Abdominal aneurysm: Yes:     WEIGHT BEARING RESTRICTIONS: No  FALLS:  Has patient fallen in last 6 months? No   LIVING ENVIRONMENT: Patient lives with:wife Lives in: House/apartment   OCCUPATION: retired  LEISURE: foot bike, arm exercises  PRIOR LEVEL OF FUNCTION: Independent   OBJECTIVE: Note: Objective measures were completed at Evaluation unless otherwise noted.  COGNITION: Overall cognitive status: Within functional limits for tasks assessed                  POSTURE:  Forward head and rounded shoulders posture, slumped sitting posture     SHOULDER AROM:   [facility]   CERVICAL AROM:   Percent limited  Flexion WNL  Extension Dec 50%  Right lateral flexion Dec 60%  Left lateral flexion Dec 60%  Right rotation Dec 40%  Left rotation Dec 50    (Blank rows=not tested)    LYMPHEDEMA ASSESSMENT:      Circumference in cm  4 cm superior to sternal notch around neck 41.5  42.76 cm superior to sternal notch around neck 42.7  8 cm superior to sternal notch around neck 44.5  R lateral nostril from base of nose to medial tragus    L lateral nostril from base of nose to medial tragus    R corner of mouth to where ear lobe meets face 9.0  L corner of mouth to where ear lobe meets face 9.3 cm   left upper mandible swollen greater than right         (Blank rows=not tested)        GAIT: independent, flexed posture, slightly off balance  TREATMENT TODAY 09/27/2023 Discussed importance of tribute garment and MLD to decrease swelling and if swelling appears gone, for prevention because lymphedema can wax and wane depending on weather, activity level etc. Performed Norton anterior sequence in supine reading instructions as I demonstrated and then had pt practice. With practice he had  better stretch and lighter pressure. He had some difficulty with Back,Side and front steps( 9 A,B, C) so advised him to do supraclavicular, breathing, axillary regions and central front neck( 9 D) until he returns next visit and we will go over all of the steps again.      PATIENT EDUCATION:  Education details:  importance of posture when sitting, shortened MLD sequence until next visit, showed poster of lymphatics to demonstrate superficial nature and why technique is so gentle.    continued need for MLD and compression due to exacerbation Person educated: Patient Education method: Explanation, Demonstration, Had Handout Education comprehension: Patient verbalized understanding/ return demonstrated some steps of MLD HOME EXERCISE PROGRAM:     ASSESSMENT:  CLINICAL IMPRESSION: Pt arrives to PT with neck and mandibular lymphedema. He completed radiation on 07/18/22 for  secondary malignant neoplasm of lymph nodes of neck. He had prior radiation for squamous cell carcinoma of lip and completed on 04/12/22. Educated pt briefly anatomy and physiology of lymphatic system and how edema can place him at an increased risk of infection.. Explained that he will continue to benefit from his tribute garment which he has not been wearing, and continuation of MLD. Pt feels he needs further review of self Manual lymphatic drainage to be proficient as he has not done since Feb. Pt would benefit from skilled PT services to decrease neck and mandibular lymphedema and instruct pt on independent management.    Pt will benefit from skilled therapeutic intervention to improve on the following deficits: decreased knowledge of condition, decreased knowledge of use of DME, decreased ROM, increased edema, increased fascial restrictions, and postural dysfunction   PT treatment/interventions: ADL/self-care home management, pt/family education, therapeutic exercise. Other interventions Therapeutic exercises, Therapeutic  activity, Patient/Family education, Self Care, Orthotic/Fit training, Manual lymph drainage, Compression , Taping, Vasopneumatic device, Manual therapy, and Re-evaluation      REHAB POTENTIAL: Good  CLINICAL DECISION MAKING: Stable/uncomplicated  EVALUATION COMPLEXITY: Low   GOALS: Goals reviewed with patient? YES  LONG TERM GOALS: (STG=LTG)   Name Target Date  Goal status  1 Patient will be able to verbalize understanding of a home exercise program for cervical range of motion, and posture,    Baseline:  No knowledge 11/08/2023 NEW  2  Pt will be independent in self MLD for long term management of lymphedema.  11/08/2023 NEW  3 Pt will demonstrate Decrease in edema throughout neck/mandible  11/08/2023 NEW  4         PLAN:  PT FREQUENCY/DURATION: 2x/week x 3 weeks, 1x/week x 3 weeks or 9 visits  PLAN FOR NEXT SESSION:  begin MLD using Norton anterior approach, review with pt, did he start wearing garment again.,        Latisha Poland, PT 09/27/2023, 5:28 PM

## 2023-09-27 ENCOUNTER — Other Ambulatory Visit: Payer: Self-pay

## 2023-09-27 ENCOUNTER — Ambulatory Visit: Attending: Radiology

## 2023-09-27 DIAGNOSIS — I890 Lymphedema, not elsewhere classified: Secondary | ICD-10-CM | POA: Insufficient documentation

## 2023-09-27 DIAGNOSIS — C770 Secondary and unspecified malignant neoplasm of lymph nodes of head, face and neck: Secondary | ICD-10-CM | POA: Insufficient documentation

## 2023-09-27 DIAGNOSIS — R293 Abnormal posture: Secondary | ICD-10-CM | POA: Insufficient documentation

## 2023-10-04 ENCOUNTER — Ambulatory Visit

## 2023-10-04 DIAGNOSIS — C770 Secondary and unspecified malignant neoplasm of lymph nodes of head, face and neck: Secondary | ICD-10-CM | POA: Diagnosis not present

## 2023-10-04 DIAGNOSIS — I890 Lymphedema, not elsewhere classified: Secondary | ICD-10-CM

## 2023-10-04 DIAGNOSIS — R293 Abnormal posture: Secondary | ICD-10-CM

## 2023-10-04 NOTE — Therapy (Signed)
 OUTPATIENT PHYSICAL THERAPY HEAD AND NECK TREATMENT   Patient Name: Zachary Padilla MRN: [id number] DOB:1928/08/03, 89 y.o., male Today's Date: 10/04/2023  END OF SESSION:  PT End of Session - 10/04/23 1025     Visit Number 2    Number of Visits 9    Date for PT Re-Evaluation 11/08/23    Authorization Type [employer] Medicare    PT Start Time 1013   pt arrived late   PT Stop Time 1103    PT Time Calculation (min) 50 min    Activity Tolerance Patient tolerated treatment well    Behavior During Therapy WFL for tasks assessed/performed             Past Medical History:  Diagnosis Date   ACNE ROSACEA 09/09/2009   ALLERGIC RHINITIS 02/23/2009   Basal cell carcinoma 06/23/2010   R cheek- (CX35FU+ exc )   COLONIC POLYPS, HX OF 02/23/2009   Convulsions/seizures (HCC) 10/24/2014   DIVERTICULITIS, HX OF 02/23/2009   Ear infection    Family history of heart disease    GERD 02/23/2009   Hyperlipidemia    HYPERTENSION 02/23/2009   PONV (postoperative nausea and vomiting)    Posterior vitreous detachment of right eye 08/26/2019   Pre-syncope    Thoracic aortic aneurysm (HCC)    URINARY INCONTINENCE 02/23/2009   Past Surgical History:  Procedure Laterality Date   APPENDECTOMY  1946   CATARACT EXTRACTION Bilateral    EYE SURGERY Right 07/25/2019   Left ear tube  2001   Removal of pre-cancer bump on neck  2005   Ruptured appendics  1946   Sinus surgery  2008   Patient Active Problem List   Diagnosis Date Noted   Dizziness 09/14/2022   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   Preoperative clearance 06/08/2022   Pain due to onychomycosis of toenails of both feet 03/08/2022   Cancer of lower lip, 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   Vitreous floater, right 11/18/2021   Macular hole of right eye 09/29/2021   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 09/29/2021   Moderate mitral regurgitation 07/06/2021   Lower extremity  edema 07/06/2021   Cystoid macular edema of left eye 04/20/2021   Macular pucker, left eye 04/14/2020   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   Sleep apnea, obstructive 12/02/2019   Status post eye surgery 09/04/2019   Superficial keratitis with conjunctivitis, right 08/29/2019   Right epiretinal membrane 08/26/2019   Retinal telangiectasia of right eye 08/26/2019   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   Chronic otitis media 08/25/2015   Sensorineural hearing loss 08/25/2015   Convulsions/seizures (HCC) 10/24/2014   Hyperlipidemia 02/13/2013   Thoracic aortic aneurysm (HCC) 02/13/2013   Right bundle branch block 02/13/2013   Nonspecific abnormal electrocardiogram (ECG) (EKG) 01/02/2012   ACNE ROSACEA 09/09/2009   Essential hypertension 02/23/2009   ALLERGIC RHINITIS 02/23/2009   GERD 02/23/2009   URINARY INCONTINENCE 02/23/2009   History of colonic polyps 02/23/2009   DIVERTICULITIS, HX OF 02/23/2009    PCP:   REFERRING PROVIDER: Julio Ohm [facility]  REFERRING DIAG: Malignant neoplasm of neck  THERAPY DIAG:  Secondary malignant neoplasm of cervical lymph node (HCC)  Lymphedema, not elsewhere classified  Abnormal posture  Rationale for Evaluation and Treatment: Rehabilitation  ONSET DATE: 07/2022  SUBJECTIVE:     SUBJECTIVE STATEMENT: I've been doing the self MLD since I saw Robin last and I feel like I'm grasping the concept a little  better. I brought my H&N garment so you can make sure I'm wearing it right/tight enough.  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 05/2022 had recurrent malignancy. Pt was offered neck dissection but opted for repeat radiation which was completed on 07/18/2022. Pt had prior PT 11/15/2022, and completed on 02/01/2024.  PATIENT GOALS:   to be educated about the signs and symptoms of lymphedema and learn  HEP.   PAIN:  Are you having pain?  No  PRECAUTIONS: thoracic aneurysm, incontinence,cervical lymphedema  RED FLAGS: Bowel or bladder incontinence: Yes:   and Abdominal aneurysm: Yes:     WEIGHT BEARING RESTRICTIONS: No  FALLS:  Has patient fallen in last 6 months? No   LIVING ENVIRONMENT: Patient lives with:wife Lives in: House/apartment   OCCUPATION: retired  LEISURE: foot bike, arm exercises  PRIOR LEVEL OF FUNCTION: Independent   OBJECTIVE: Note: Objective measures were completed at Evaluation unless otherwise noted.  COGNITION: Overall cognitive status: Within functional limits for tasks assessed                  POSTURE:  Forward head and rounded shoulders posture, slumped sitting posture     SHOULDER AROM:   [facility]   CERVICAL AROM:   Percent limited  Flexion WNL  Extension Dec 50%  Right lateral flexion Dec 60%  Left lateral flexion Dec 60%  Right rotation Dec 40%  Left rotation Dec 50    (Blank rows=not tested)    LYMPHEDEMA ASSESSMENT:      Circumference in cm  4 cm superior to sternal notch around neck 41.5  42.76 cm superior to sternal notch around neck 42.7  8 cm superior to sternal notch around neck 44.5  R lateral nostril from base of nose to medial tragus    L lateral nostril from base of nose to medial tragus    R corner of mouth to where ear lobe meets face 9.0  L corner of mouth to where ear lobe meets face 9.3 cm   left upper mandible swollen greater than right         (Blank rows=not tested)        GAIT: independent, flexed posture, slightly off balance  TREATMENT TODAY 10/04/23: Self Care Spent a lot of time during session answering pts questions about the lymphatic system and verbally reviewing principles of MLD. Pt is seeming to be able to verbalize a better understanding today than when this therapist saw him during previous episode of care. He also brought in his H&N compression garment and wanted to review proper donning, placement and amount of  tightness.compression from straps.  Manual Therapy MLD: Began with review of modified steps that PT had pt start with at last session so as not to overwhelm him as he had become easily confused during last episode of care. Today he was able to verbalize a better understanding after our initial review today. Then added two more steps for him to cont with self MLD and had him return demo of each step with therapist sitting in front of him demo'ing on self and then repeating on pt, and hand over hand technique used for further reinforcement as follows per [facility] anterior approach handout that pt brought with him: SCF, resisted diaphragmatic breaths, bil axillary nodes, anterior chest bil, cervical nodes (step 9b) and anterior throat (step 9d)  09/27/2023 Discussed importance of tribute garment and MLD to decrease swelling  and if swelling appears gone, for prevention because lymphedema can wax and wane depending on weather, activity level etc. Performed Norton anterior sequence in supine reading instructions as I demonstrated and then had pt practice. With practice he had better stretch and lighter pressure. He had some difficulty with Back,Side and front steps( 9 A,B, C) so advised him to do supraclavicular, breathing, axillary regions and central front neck( 9 D) until he returns next visit and we will go over all of the steps again.      PATIENT EDUCATION:  Education details:  importance of posture when sitting, shortened MLD sequence until next visit, showed poster of lymphatics to demonstrate superficial nature and why technique is so gentle.    continued need for MLD and compression due to exacerbation Person educated: Patient Education method: Explanation, Demonstration, Had Handout Education comprehension: Patient verbalized understanding/ return demonstrated some steps of MLD HOME EXERCISE PROGRAM:     ASSESSMENT:  CLINICAL IMPRESSION: Pt brought his H&N compression garment and wanted  to review proper placement and donning so did this. Then spent time answering his questions about anatomy of lymphatic system, principles of MLD, and why he will probably always need to to perform self MLD and wear his compression garment as we don't know how damaged his lymph nodes were with radiation. Then reviewed self MLD using multiple ways of teaching for reinforcement: demo, performing on pt and then hand over hand technique for directionality of skin stretch and pressure check.Pt seemed to be able to verbalize a better understanding today. He was encouraged to only work on these few steps (see above) per a post it on the handout directing that only for now and we will add steps as he seems able to tolerate.      Pt will benefit from skilled therapeutic intervention to improve on the following deficits: decreased knowledge of condition, decreased knowledge of use of DME, decreased ROM, increased edema, increased fascial restrictions, and postural dysfunction   PT treatment/interventions: ADL/self-care home management, pt/family education, therapeutic exercise. Other interventions Therapeutic exercises, Therapeutic activity, Patient/Family education, Self Care, Orthotic/Fit training, Manual lymph drainage, Compression , Taping, Vasopneumatic device, Manual therapy, and Re-evaluation      REHAB POTENTIAL: Good  CLINICAL DECISION MAKING: Stable/uncomplicated  EVALUATION COMPLEXITY: Low   GOALS: Goals reviewed with patient? YES  LONG TERM GOALS: (STG=LTG)   Name Target Date  Goal status  1 Patient will be able to verbalize understanding of a home exercise program for cervical range of motion, and posture,    Baseline:  No knowledge 11/08/2023 NEW  2  Pt will be independent in self MLD for long term management of lymphedema.  11/08/2023 NEW  3 Pt will demonstrate Decrease in edema throughout neck/mandible  11/08/2023 NEW  4         PLAN:  PT FREQUENCY/DURATION: 2x/week x 3 weeks,  1x/week x 3 weeks or 9 visits  PLAN FOR NEXT SESSION:  Cont MLD using Norton anterior approach, review with pt, did he start wearing garment again/how did it go?      Denyce Flank, PTA 10/04/2023, 1:29 PM

## 2023-10-09 ENCOUNTER — Ambulatory Visit: Attending: Radiology

## 2023-10-09 DIAGNOSIS — C770 Secondary and unspecified malignant neoplasm of lymph nodes of head, face and neck: Secondary | ICD-10-CM | POA: Diagnosis present

## 2023-10-09 DIAGNOSIS — R293 Abnormal posture: Secondary | ICD-10-CM | POA: Diagnosis present

## 2023-10-09 DIAGNOSIS — I890 Lymphedema, not elsewhere classified: Secondary | ICD-10-CM | POA: Insufficient documentation

## 2023-10-09 NOTE — Therapy (Signed)
 OUTPATIENT PHYSICAL THERAPY HEAD AND NECK TREATMENT   Patient Name: Zachary Padilla MRN: [id number] DOB:August 10, 1928, 89 y.o., male Today's Date: 10/09/2023  END OF SESSION:  PT End of Session - 10/09/23 1209     Visit Number 3    Number of Visits 9    Date for PT Re-Evaluation 11/08/23    Authorization Type [employer] Medicare    PT Start Time 1209   late   PT Stop Time 1305    PT Time Calculation (min) 56 min    Activity Tolerance Patient tolerated treatment well    Behavior During Therapy WFL for tasks assessed/performed             Past Medical History:  Diagnosis Date   ACNE ROSACEA 09/09/2009   ALLERGIC RHINITIS 02/23/2009   Basal cell carcinoma 06/23/2010   R cheek- (CX35FU+ exc )   COLONIC POLYPS, HX OF 02/23/2009   Convulsions/seizures (HCC) 10/24/2014   DIVERTICULITIS, HX OF 02/23/2009   Ear infection    Family history of heart disease    GERD 02/23/2009   Hyperlipidemia    HYPERTENSION 02/23/2009   PONV (postoperative nausea and vomiting)    Posterior vitreous detachment of right eye 08/26/2019   Pre-syncope    Thoracic aortic aneurysm (HCC)    URINARY INCONTINENCE 02/23/2009   Past Surgical History:  Procedure Laterality Date   APPENDECTOMY  1946   CATARACT EXTRACTION Bilateral    EYE SURGERY Right 07/25/2019   Left ear tube  2001   Removal of pre-cancer bump on neck  2005   Ruptured appendics  1946   Sinus surgery  2008   Patient Active Problem List   Diagnosis Date Noted   Dizziness 09/14/2022   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   Preoperative clearance 06/08/2022   Pain due to onychomycosis of toenails of both feet 03/08/2022   Cancer of lower lip, 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   Vitreous floater, right 11/18/2021   Macular hole of right eye 09/29/2021   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 09/29/2021   Moderate mitral regurgitation 07/06/2021   Lower extremity edema  07/06/2021   Cystoid macular edema of left eye 04/20/2021   Macular pucker, left eye 04/14/2020   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   Sleep apnea, obstructive 12/02/2019   Status post eye surgery 09/04/2019   Superficial keratitis with conjunctivitis, right 08/29/2019   Right epiretinal membrane 08/26/2019   Retinal telangiectasia of right eye 08/26/2019   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   Chronic otitis media 08/25/2015   Sensorineural hearing loss 08/25/2015   Convulsions/seizures (HCC) 10/24/2014   Hyperlipidemia 02/13/2013   Thoracic aortic aneurysm (HCC) 02/13/2013   Right bundle branch block 02/13/2013   Nonspecific abnormal electrocardiogram (ECG) (EKG) 01/02/2012   ACNE ROSACEA 09/09/2009   Essential hypertension 02/23/2009   ALLERGIC RHINITIS 02/23/2009   GERD 02/23/2009   URINARY INCONTINENCE 02/23/2009   History of colonic polyps 02/23/2009   DIVERTICULITIS, HX OF 02/23/2009    PCP:   REFERRING PROVIDER: Julio Ohm [facility]  REFERRING DIAG: Malignant neoplasm of neck  THERAPY DIAG:  Secondary malignant neoplasm of cervical lymph node (HCC)  Lymphedema, not elsewhere classified  Abnormal posture  Rationale for Evaluation and Treatment: Rehabilitation  ONSET DATE: 07/2022  SUBJECTIVE:     SUBJECTIVE STATEMENT: I've worn the garment since seeing Tarri Farm last visit. I have worn it about 4 hours a day and I think it helps.  I have done the MLD 2x's since I was here last. I feel like I am pushing too hard.  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 05/2022 had recurrent malignancy. Pt was offered neck dissection but opted for repeat radiation which was completed on 07/18/2022. Pt had prior PT 11/15/2022, and completed on 02/01/2024.  PATIENT GOALS:   to be educated about the signs and symptoms of lymphedema and learn  HEP.   PAIN:  Are you having pain? No  PRECAUTIONS:  thoracic aneurysm, incontinence,cervical lymphedema  RED FLAGS: Bowel or bladder incontinence: Yes:   and Abdominal aneurysm: Yes:     WEIGHT BEARING RESTRICTIONS: No  FALLS:  Has patient fallen in last 6 months? No   LIVING ENVIRONMENT: Patient lives with:wife Lives in: House/apartment   OCCUPATION: retired  LEISURE: foot bike, arm exercises  PRIOR LEVEL OF FUNCTION: Independent   OBJECTIVE: Note: Objective measures were completed at Evaluation unless otherwise noted.  COGNITION: Overall cognitive status: Within functional limits for tasks assessed                  POSTURE:  Forward head and rounded shoulders posture, slumped sitting posture     SHOULDER AROM:   [facility]   CERVICAL AROM:   Percent limited  Flexion WNL  Extension Dec 50%  Right lateral flexion Dec 60%  Left lateral flexion Dec 60%  Right rotation Dec 40%  Left rotation Dec 50    (Blank rows=not tested)    LYMPHEDEMA ASSESSMENT:      Circumference in cm  4 cm superior to sternal notch around neck 41.5  42.76 cm superior to sternal notch around neck 42.7  8 cm superior to sternal notch around neck 44.5  R lateral nostril from base of nose to medial tragus    L lateral nostril from base of nose to medial tragus    R corner of mouth to where ear lobe meets face 9.0  L corner of mouth to where ear lobe meets face 9.3 cm   left upper mandible swollen greater than right         (Blank rows=not tested)        GAIT: independent, flexed posture, slightly off balance  TREATMENT TODAY 10/09/2023 Reviewed poster in my room that demonstrates superficial nature of lymphatics as to why it is important for light touch Performed shortened version of Anterior Norton sequence Short neck, breathing, Bilateral axillary LN activation, anterior chest bilaterally and 9B, 9d. Therapist performed first and had pt return demonstrate in supine with table elevated, and then had pt sit in chair and review all  steps again while watching therapist. Pt felt much better after practicing today and pressure was good. Pt had difficulty getting his hand in the right position for 9 B when using same side hand, but did in proper place when reaching across with his other hand.  10/04/23: Self Care Spent a lot of time during session answering pts questions about the lymphatic system and verbally reviewing principles of MLD. Pt is seeming to be able to verbalize a better understanding today than when this therapist saw him during previous episode of care. He also brought in his H&N compression garment and wanted to review proper donning, placement and amount of tightness.compression from straps.  Manual Therapy MLD: Began with review of modified steps that PT had pt start with at last session so as not to  overwhelm him as he had become easily confused during last episode of care. Today he was able to verbalize a better understanding after our initial review today. Then added two more steps for him to cont with self MLD and had him return demo of each step with therapist sitting in front of him demo'ing on self and then repeating on pt, and hand over hand technique used for further reinforcement as follows per [facility] anterior approach handout that pt brought with him: SCF, resisted diaphragmatic breaths, bil axillary nodes, anterior chest bil, cervical nodes (step 9b) and anterior throat (step 9d)  09/27/2023 Discussed importance of tribute garment and MLD to decrease swelling and if swelling appears gone, for prevention because lymphedema can wax and wane depending on weather, activity level etc. Performed Norton anterior sequence in supine reading instructions as I demonstrated and then had pt practice. With practice he had better stretch and lighter pressure. He had some difficulty with Back,Side and front steps( 9 A,B, C) so advised him to do supraclavicular, breathing, axillary regions and central front neck( 9 D) until  he returns next visit and we will go over all of the steps again.      PATIENT EDUCATION:  Education details:  importance of posture when sitting, shortened MLD sequence until next visit, showed poster of lymphatics to demonstrate superficial nature and why technique is so gentle.    continued need for MLD and compression due to exacerbation Person educated: Patient Education method: Explanation, Demonstration, Had Handout Education comprehension: Patient verbalized understanding/ return demonstrated some steps of MLD HOME EXERCISE PROGRAM:     ASSESSMENT:  CLINICAL IMPRESSION:  Pt feels he is getting good reduction from his tribute garment.  He improved with his performance of MLD today, using appropriate pressure, with VC's mainly for direction of stretch, and modifying hand placement as he tends to use his hands to far back when doing 9B. Improved when using opposite side hand to do 9B.  Pt will benefit from skilled therapeutic intervention to improve on the following deficits: decreased knowledge of condition, decreased knowledge of use of DME, decreased ROM, increased edema, increased fascial restrictions, and postural dysfunction   PT treatment/interventions: ADL/self-care home management, pt/family education, therapeutic exercise. Other interventions Therapeutic exercises, Therapeutic activity, Patient/Family education, Self Care, Orthotic/Fit training, Manual lymph drainage, Compression , Taping, Vasopneumatic device, Manual therapy, and Re-evaluation      REHAB POTENTIAL: Good  CLINICAL DECISION MAKING: Stable/uncomplicated  EVALUATION COMPLEXITY: Low   GOALS: Goals reviewed with patient? YES  LONG TERM GOALS: (STG=LTG)   Name Target Date  Goal status  1 Patient will be able to verbalize understanding of a home exercise program for cervical range of motion, and posture,    Baseline:  No knowledge 11/08/2023 NEW  2  Pt will be independent in self MLD for long  term management of lymphedema.  11/08/2023 NEW  3 Pt will demonstrate Decrease in edema throughout neck/mandible  11/08/2023 NEW  4         PLAN:  PT FREQUENCY/DURATION: 2x/week x 3 weeks, 1x/week x 3 weeks or 9 visits  PLAN FOR NEXT SESSION:  Cont MLD using Norton anterior approach, review with pt, did he start wearing garment again/how did it go?      Latisha Poland, PT 10/09/2023, 1:07 PM

## 2023-10-11 ENCOUNTER — Ambulatory Visit

## 2023-10-11 DIAGNOSIS — R293 Abnormal posture: Secondary | ICD-10-CM

## 2023-10-11 DIAGNOSIS — C770 Secondary and unspecified malignant neoplasm of lymph nodes of head, face and neck: Secondary | ICD-10-CM | POA: Diagnosis not present

## 2023-10-11 DIAGNOSIS — I890 Lymphedema, not elsewhere classified: Secondary | ICD-10-CM

## 2023-10-11 NOTE — Therapy (Signed)
 OUTPATIENT PHYSICAL THERAPY HEAD AND NECK TREATMENT   Patient Name: Zachary Padilla MRN: [id number] DOB:1928/09/18, 89 y.o., male Today's Date: 10/11/2023  END OF SESSION:  PT End of Session - 10/11/23 1017     Visit Number 4    Number of Visits 9    Date for PT Re-Evaluation 11/08/23    Authorization Type [employer]    Authorization Time Period 09/27/23-10/25/2023    Authorization - Visit Number 4    Authorization - Number of Visits 9    PT Start Time 1017   came at wrong time and was seen   PT Stop Time 1058    PT Time Calculation (min) 41 min    Activity Tolerance Patient tolerated treatment well    Behavior During Therapy [facility] for tasks assessed/performed             Past Medical History:  Diagnosis Date   ACNE ROSACEA 09/09/2009   ALLERGIC RHINITIS 02/23/2009   Basal cell carcinoma 06/23/2010   R cheek- (CX35FU+ exc )   COLONIC POLYPS, HX OF 02/23/2009   Convulsions/seizures (HCC) 10/24/2014   DIVERTICULITIS, HX OF 02/23/2009   Ear infection    Family history of heart disease    GERD 02/23/2009   Hyperlipidemia    HYPERTENSION 02/23/2009   PONV (postoperative nausea and vomiting)    Posterior vitreous detachment of right eye 08/26/2019   Pre-syncope    Thoracic aortic aneurysm (HCC)    URINARY INCONTINENCE 02/23/2009   Past Surgical History:  Procedure Laterality Date   APPENDECTOMY  1946   CATARACT EXTRACTION Bilateral    EYE SURGERY Right 07/25/2019   Left ear tube  2001   Removal of pre-cancer bump on neck  2005   Ruptured appendics  1946   Sinus surgery  2008   Patient Active Problem List   Diagnosis Date Noted   Dizziness 09/14/2022   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   Preoperative clearance 06/08/2022   Pain due to onychomycosis of toenails of both feet 03/08/2022   Cancer of lower lip, 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   Vitreous floater, right 11/18/2021   Macular hole of right eye  09/29/2021   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 09/29/2021   Moderate mitral regurgitation 07/06/2021   Lower extremity edema 07/06/2021   Cystoid macular edema of left eye 04/20/2021   Macular pucker, left eye 04/14/2020   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   Sleep apnea, obstructive 12/02/2019   Status post eye surgery 09/04/2019   Superficial keratitis with conjunctivitis, right 08/29/2019   Right epiretinal membrane 08/26/2019   Retinal telangiectasia of right eye 08/26/2019   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   Chronic otitis media 08/25/2015   Sensorineural hearing loss 08/25/2015   Convulsions/seizures (HCC) 10/24/2014   Hyperlipidemia 02/13/2013   Thoracic aortic aneurysm (HCC) 02/13/2013   Right bundle branch block 02/13/2013   Nonspecific abnormal electrocardiogram (ECG) (EKG) 01/02/2012   ACNE ROSACEA 09/09/2009   Essential hypertension 02/23/2009   ALLERGIC RHINITIS 02/23/2009   GERD 02/23/2009   URINARY INCONTINENCE 02/23/2009   History of colonic polyps 02/23/2009   DIVERTICULITIS, HX OF 02/23/2009    PCP:   REFERRING PROVIDER: Julio Ohm [facility]  REFERRING DIAG: Malignant neoplasm of neck  THERAPY DIAG:  Secondary malignant neoplasm of cervical lymph node (HCC)  Lymphedema, not elsewhere classified  Abnormal posture  Rationale for Evaluation and Treatment: Rehabilitation  ONSET DATE: 07/2022  SUBJECTIVE:     SUBJECTIVE STATEMENT: I wore my garment. It really helped that you took extra time on my neck last time. A little pain in the left jaw area and left ear.   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 05/2022 had recurrent malignancy. Pt was offered neck dissection but opted for repeat radiation which was completed on 07/18/2022. Pt had prior PT 11/15/2022, and completed on 02/01/2024.  PATIENT GOALS:   to be educated about the signs and symptoms  of lymphedema and learn  HEP.   PAIN:  Are you having pain? No  PRECAUTIONS: thoracic aneurysm, incontinence,cervical lymphedema  RED FLAGS: Bowel or bladder incontinence: Yes:   and Abdominal aneurysm: Yes:     WEIGHT BEARING RESTRICTIONS: No  FALLS:  Has patient fallen in last 6 months? No   LIVING ENVIRONMENT: Patient lives with:wife Lives in: House/apartment   OCCUPATION: retired  LEISURE: foot bike, arm exercises  PRIOR LEVEL OF FUNCTION: Independent   OBJECTIVE: Note: Objective measures were completed at Evaluation unless otherwise noted.  COGNITION: Overall cognitive status: Within functional limits for tasks assessed                  POSTURE:  Forward head and rounded shoulders posture, slumped sitting posture     SHOULDER AROM:   [facility]   CERVICAL AROM:   Percent limited  Flexion WNL  Extension Dec 50%  Right lateral flexion Dec 60%  Left lateral flexion Dec 60%  Right rotation Dec 40%  Left rotation Dec 50    (Blank rows=not tested)    LYMPHEDEMA ASSESSMENT:      Circumference in cm  4 cm superior to sternal notch around neck 41.5  42.76 cm superior to sternal notch around neck 42.7  8 cm superior to sternal notch around neck 44.5  R lateral nostril from base of nose to medial tragus    L lateral nostril from base of nose to medial tragus    R corner of mouth to where ear lobe meets face 9.0  L corner of mouth to where ear lobe meets face 9.3 cm   left upper mandible swollen greater than right         (Blank rows=not tested)        GAIT: independent, flexed posture, slightly off balance  TREATMENT TODAY 10/11/2023 Pt came at the wrong time and was seen, but was 14 min after the hour Therapist performed all MLD techniques using full Norton Anterior neck sequence with extra emphasis on front of neck, and left mandibular area   10/09/2023 Reviewed poster in my room that demonstrates superficial nature of lymphatics as to why it is  important for light touch Performed shortened version of Anterior Norton sequence Short neck, breathing, Bilateral axillary LN activation, anterior chest bilaterally and 9B, 9d. Therapist performed first and had pt return demonstrate in supine with table elevated, and then had pt sit in chair and review all steps again while watching therapist. Pt felt much better after practicing today and pressure was good. Pt had difficulty getting his hand in the right position for 9 B when using same side hand, but did in proper place when reaching across with his other hand.  10/04/23: Self Care Spent a lot of time during session answering pts questions about the lymphatic system and verbally reviewing principles of MLD. Pt is seeming to be able to verbalize a  better understanding today than when this therapist saw him during previous episode of care. He also brought in his H&N compression garment and wanted to review proper donning, placement and amount of tightness.compression from straps.  Manual Therapy MLD: Began with review of modified steps that PT had pt start with at last session so as not to overwhelm him as he had become easily confused during last episode of care. Today he was able to verbalize a better understanding after our initial review today. Then added two more steps for him to cont with self MLD and had him return demo of each step with therapist sitting in front of him demo'ing on self and then repeating on pt, and hand over hand technique used for further reinforcement as follows per [facility] anterior approach handout that pt brought with him: SCF, resisted diaphragmatic breaths, bil axillary nodes, anterior chest bil, cervical nodes (step 9b) and anterior throat (step 9d)  09/27/2023 Discussed importance of tribute garment and MLD to decrease swelling and if swelling appears gone, for prevention because lymphedema can wax and wane depending on weather, activity level etc. Performed Norton  anterior sequence in supine reading instructions as I demonstrated and then had pt practice. With practice he had better stretch and lighter pressure. He had some difficulty with Back,Side and front steps( 9 A,B, C) so advised him to do supraclavicular, breathing, axillary regions and central front neck( 9 D) until he returns next visit and we will go over all of the steps again.      PATIENT EDUCATION:  Education details:  importance of posture when sitting, shortened MLD sequence until next visit, showed poster of lymphatics to demonstrate superficial nature and why technique is so gentle.    continued need for MLD and compression due to exacerbation Person educated: Patient Education method: Explanation, Demonstration, Had Handout Education comprehension: Patient verbalized understanding/ return demonstrated some steps of MLD HOME EXERCISE PROGRAM:     ASSESSMENT:  CLINICAL IMPRESSION:  Softening noted in anterior neck after MLD today. Pt had great benefit from last session and noted good improvement in swelling   Pt will benefit from skilled therapeutic intervention to improve on the following deficits: decreased knowledge of condition, decreased knowledge of use of DME, decreased ROM, increased edema, increased fascial restrictions, and postural dysfunction   PT treatment/interventions: ADL/self-care home management, pt/family education, therapeutic exercise. Other interventions Therapeutic exercises, Therapeutic activity, Patient/Family education, Self Care, Orthotic/Fit training, Manual lymph drainage, Compression , Taping, Vasopneumatic device, Manual therapy, and Re-evaluation      REHAB POTENTIAL: Good  CLINICAL DECISION MAKING: Stable/uncomplicated  EVALUATION COMPLEXITY: Low   GOALS: Goals reviewed with patient? YES  LONG TERM GOALS: (STG=LTG)   Name Target Date  Goal status  1 Patient will be able to verbalize understanding of a home exercise program for  cervical range of motion, and posture,    Baseline:  No knowledge 11/08/2023 NEW  2  Pt will be independent in self MLD for long term management of lymphedema.  11/08/2023 NEW  3 Pt will demonstrate Decrease in edema throughout neck/mandible  11/08/2023 NEW  4         PLAN:  PT FREQUENCY/DURATION: 2x/week x 3 weeks, 1x/week x 3 weeks or 9 visits  PLAN FOR NEXT SESSION:  Cont MLD using Norton anterior approach, review with pt, did he start wearing garment again/how did it go?      Latisha Poland, PT 10/11/2023, 10:59 AM

## 2023-10-16 ENCOUNTER — Ambulatory Visit

## 2023-10-16 DIAGNOSIS — C770 Secondary and unspecified malignant neoplasm of lymph nodes of head, face and neck: Secondary | ICD-10-CM

## 2023-10-16 DIAGNOSIS — I890 Lymphedema, not elsewhere classified: Secondary | ICD-10-CM

## 2023-10-16 DIAGNOSIS — R293 Abnormal posture: Secondary | ICD-10-CM

## 2023-10-16 NOTE — Therapy (Signed)
 OUTPATIENT PHYSICAL THERAPY HEAD AND NECK TREATMENT   Patient Name: Zachary Padilla MRN: [id number] DOB:02/23/1929, 89 y.o., male Today's Date: 10/16/2023  END OF SESSION:  PT End of Session - 10/16/23 1002     Visit Number 5    Number of Visits 9    Date for PT Re-Evaluation 11/08/23    Authorization Type [employer] Medicare    Authorization Time Period 09/27/23-10/25/2023    Authorization - Visit Number 5    Authorization - Number of Visits 9    PT Start Time 1003    Activity Tolerance Patient tolerated treatment well    Behavior During Therapy [facility] for tasks assessed/performed             Past Medical History:  Diagnosis Date   ACNE ROSACEA 09/09/2009   ALLERGIC RHINITIS 02/23/2009   Basal cell carcinoma 06/23/2010   R cheek- (CX35FU+ exc )   COLONIC POLYPS, HX OF 02/23/2009   Convulsions/seizures (HCC) 10/24/2014   DIVERTICULITIS, HX OF 02/23/2009   Ear infection    Family history of heart disease    GERD 02/23/2009   Hyperlipidemia    HYPERTENSION 02/23/2009   PONV (postoperative nausea and vomiting)    Posterior vitreous detachment of right eye 08/26/2019   Pre-syncope    Thoracic aortic aneurysm (HCC)    URINARY INCONTINENCE 02/23/2009   Past Surgical History:  Procedure Laterality Date   APPENDECTOMY  1946   CATARACT EXTRACTION Bilateral    EYE SURGERY Right 07/25/2019   Left ear tube  2001   Removal of pre-cancer bump on neck  2005   Ruptured appendics  1946   Sinus surgery  2008   Patient Active Problem List   Diagnosis Date Noted   Dizziness 09/14/2022   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   Preoperative clearance 06/08/2022   Pain due to onychomycosis of toenails of both feet 03/08/2022   Cancer of lower lip, 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   Vitreous floater, right 11/18/2021   Macular hole of right eye 09/29/2021   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 09/29/2021   Moderate mitral  regurgitation 07/06/2021   Lower extremity edema 07/06/2021   Cystoid macular edema of left eye 04/20/2021   Macular pucker, left eye 04/14/2020   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   Sleep apnea, obstructive 12/02/2019   Status post eye surgery 09/04/2019   Superficial keratitis with conjunctivitis, right 08/29/2019   Right epiretinal membrane 08/26/2019   Retinal telangiectasia of right eye 08/26/2019   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   Chronic otitis media 08/25/2015   Sensorineural hearing loss 08/25/2015   Convulsions/seizures (HCC) 10/24/2014   Hyperlipidemia 02/13/2013   Thoracic aortic aneurysm (HCC) 02/13/2013   Right bundle branch block 02/13/2013   Nonspecific abnormal electrocardiogram (ECG) (EKG) 01/02/2012   ACNE ROSACEA 09/09/2009   Essential hypertension 02/23/2009   ALLERGIC RHINITIS 02/23/2009   GERD 02/23/2009   URINARY INCONTINENCE 02/23/2009   History of colonic polyps 02/23/2009   DIVERTICULITIS, HX OF 02/23/2009    PCP:   REFERRING PROVIDER: Julio Ohm [facility]  REFERRING DIAG: Malignant neoplasm of neck  THERAPY DIAG:  Secondary malignant neoplasm of cervical lymph node (HCC)  Lymphedema, not elsewhere classified  Abnormal posture  Rationale for Evaluation and Treatment: Rehabilitation  ONSET DATE: 07/2022  SUBJECTIVE:     SUBJECTIVE STATEMENT: I wore my garment atleast 3 hrs per day and soetimes more. I did the MLD 3  times. The first 2 times I think I did well, but the 3rd time I didn't do as well. I think I used too much pressure one time. Wearing the garment really helps.   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 05/2022 had recurrent malignancy. Pt was offered neck dissection but opted for repeat radiation which was completed on 07/18/2022. Pt had prior PT 11/15/2022, and completed on 02/01/2024.  PATIENT GOALS:   to be educated about the signs and  symptoms of lymphedema and learn  HEP.   PAIN:  Are you having pain? No  PRECAUTIONS: thoracic aneurysm, incontinence,cervical lymphedema  RED FLAGS: Bowel or bladder incontinence: Yes:   and Abdominal aneurysm: Yes:     WEIGHT BEARING RESTRICTIONS: No  FALLS:  Has patient fallen in last 6 months? No   LIVING ENVIRONMENT: Patient lives with:wife Lives in: House/apartment   OCCUPATION: retired  LEISURE: foot bike, arm exercises  PRIOR LEVEL OF FUNCTION: Independent   OBJECTIVE: Note: Objective measures were completed at Evaluation unless otherwise noted.  COGNITION: Overall cognitive status: Within functional limits for tasks assessed                  POSTURE:  Forward head and rounded shoulders posture, slumped sitting posture     SHOULDER AROM:   [facility]   CERVICAL AROM:   Percent limited  Flexion WNL  Extension Dec 50%  Right lateral flexion Dec 60%  Left lateral flexion Dec 60%  Right rotation Dec 40%  Left rotation Dec 50    (Blank rows=not tested)    LYMPHEDEMA ASSESSMENT:      Circumference in cm  4 cm superior to sternal notch around neck 41.5  42.76 cm superior to sternal notch around neck 42.7  8 cm superior to sternal notch around neck 44.5  R lateral nostril from base of nose to medial tragus    L lateral nostril from base of nose to medial tragus    R corner of mouth to where ear lobe meets face 9.0  L corner of mouth to where ear lobe meets face 9.3 cm   left upper mandible swollen greater than right         (Blank rows=not tested)        GAIT: independent, flexed posture, slightly off balance  TREATMENT TODAY  10/16/2023  Discussed pt progress and had pt perform the full shortened sequence he had been given for [facility] Anterior approach Pt seated in chair with lumbar roll behind him. Therapist read instructions while pt performed. #1,2,3,5, 9B. Since he was doing well we added 9C, continued 9 D, and added 5 and 3. He required  occasional VC's for pressure and needed occasional VC and visual cues for proper technique but did much better overall. Suggested pt sit in front of full length mirror to do at home. Will add face to next sequence to get supramandibular swelling on left.  10/11/2023 Pt came at the wrong time and was seen, but was 14 min after the hour Therapist performed all MLD techniques using full Norton Anterior neck sequence with extra emphasis on front of neck, and left mandibular area   10/09/2023 Reviewed poster in my room that demonstrates superficial nature of lymphatics as to why it is important for light touch Performed shortened version of Anterior Norton sequence Short neck, breathing, Bilateral axillary LN activation, anterior chest bilaterally and 9B, 9d. Therapist performed  first and had pt return demonstrate in supine with table elevated, and then had pt sit in chair and review all steps again while watching therapist. Pt felt much better after practicing today and pressure was good. Pt had difficulty getting his hand in the right position for 9 B when using same side hand, but did in proper place when reaching across with his other hand.  10/04/23: Self Care Spent a lot of time during session answering pts questions about the lymphatic system and verbally reviewing principles of MLD. Pt is seeming to be able to verbalize a better understanding today than when this therapist saw him during previous episode of care. He also brought in his H&N compression garment and wanted to review proper donning, placement and amount of tightness.compression from straps.  Manual Therapy MLD: Began with review of modified steps that PT had pt start with at last session so as not to overwhelm him as he had become easily confused during last episode of care. Today he was able to verbalize a better understanding after our initial review today. Then added two more steps for him to cont with self MLD and had him return demo  of each step with therapist sitting in front of him demo'ing on self and then repeating on pt, and hand over hand technique used for further reinforcement as follows per [facility] anterior approach handout that pt brought with him: SCF, resisted diaphragmatic breaths, bil axillary nodes, anterior chest bil, cervical nodes (step 9b) and anterior throat (step 9d)  09/27/2023 Discussed importance of tribute garment and MLD to decrease swelling and if swelling appears gone, for prevention because lymphedema can wax and wane depending on weather, activity level etc. Performed Norton anterior sequence in supine reading instructions as I demonstrated and then had pt practice. With practice he had better stretch and lighter pressure. He had some difficulty with Back,Side and front steps( 9 A,B, C) so advised him to do supraclavicular, breathing, axillary regions and central front neck( 9 D) until he returns next visit and we will go over all of the steps again.      PATIENT EDUCATION:  Education details:  importance of posture when sitting, shortened MLD sequence until next visit, showed poster of lymphatics to demonstrate superficial nature and why technique is so gentle.    continued need for MLD and compression due to exacerbation Person educated: Patient Education method: Explanation, Demonstration, Had Handout Education comprehension: Patient verbalized understanding/ return demonstrated some steps of MLD HOME EXERCISE PROGRAM:     ASSESSMENT:  CLINICAL IMPRESSION:  Pt has been compliant with garment wear and MLD. We reviewed the shortened sequence he was given, and while he required VC's and visual Cues he is using better pressure and technique. He did need help getting started with supraclavicular region, but then demonstrated good improvement. Added 9 C, 5, and 3 to end of sequence.   Pt will benefit from skilled therapeutic intervention to improve on the following deficits: decreased  knowledge of condition, decreased knowledge of use of DME, decreased ROM, increased edema, increased fascial restrictions, and postural dysfunction   PT treatment/interventions: ADL/self-care home management, pt/family education, therapeutic exercise. Other interventions Therapeutic exercises, Therapeutic activity, Patient/Family education, Self Care, Orthotic/Fit training, Manual lymph drainage, Compression , Taping, Vasopneumatic device, Manual therapy, and Re-evaluation      REHAB POTENTIAL: Good  CLINICAL DECISION MAKING: Stable/uncomplicated  EVALUATION COMPLEXITY: Low   GOALS: Goals reviewed with patient? YES  LONG TERM GOALS: (STG=LTG)   Name Target  Date  Goal status  1 Patient will be able to verbalize understanding of a home exercise program for cervical range of motion, and posture,    Baseline:  No knowledge 11/08/2023 NEW  2  Pt will be independent in self MLD for long term management of lymphedema.  11/08/2023 NEW  3 Pt will demonstrate Decrease in edema throughout neck/mandible  11/08/2023 NEW  4         PLAN:  PT FREQUENCY/DURATION: 2x/week x 3 weeks, 1x/week x 3 weeks or 9 visits  PLAN FOR NEXT SESSION:  Cont MLD using Norton anterior approach, review with pt, did he start wearing garment again/how did it go?   Review cervical HEP   Latisha Poland, PT 10/16/2023, 10:03 AM

## 2023-10-18 ENCOUNTER — Ambulatory Visit

## 2023-10-19 ENCOUNTER — Ambulatory Visit

## 2023-10-19 DIAGNOSIS — C770 Secondary and unspecified malignant neoplasm of lymph nodes of head, face and neck: Secondary | ICD-10-CM | POA: Diagnosis not present

## 2023-10-19 DIAGNOSIS — I890 Lymphedema, not elsewhere classified: Secondary | ICD-10-CM

## 2023-10-19 DIAGNOSIS — R293 Abnormal posture: Secondary | ICD-10-CM

## 2023-10-19 NOTE — Therapy (Signed)
 OUTPATIENT PHYSICAL THERAPY HEAD AND NECK TREATMENT   Patient Name: Zachary Padilla MRN: [id number] DOB:May 15, 1928, 89 y.o., male Today's Date: 10/19/2023  END OF SESSION:  PT End of Session - 10/19/23 1402     Visit Number 6    Number of Visits 9    Date for PT Re-Evaluation 11/08/23    Authorization Type [employer]    Authorization Time Period 09/27/23-10/25/2023    Authorization - Visit Number 6    Authorization - Number of Visits 9    PT Start Time 1403    PT Stop Time 1500    PT Time Calculation (min) 57 min    Activity Tolerance Patient tolerated treatment well    Behavior During Therapy [facility] for tasks assessed/performed          Past Medical History:  Diagnosis Date   ACNE ROSACEA 09/09/2009   ALLERGIC RHINITIS 02/23/2009   Basal cell carcinoma 06/23/2010   R cheek- (CX35FU+ exc )   COLONIC POLYPS, HX OF 02/23/2009   Convulsions/seizures (HCC) 10/24/2014   DIVERTICULITIS, HX OF 02/23/2009   Ear infection    Family history of heart disease    GERD 02/23/2009   Hyperlipidemia    HYPERTENSION 02/23/2009   PONV (postoperative nausea and vomiting)    Posterior vitreous detachment of right eye 08/26/2019   Pre-syncope    Thoracic aortic aneurysm (HCC)    URINARY INCONTINENCE 02/23/2009   Past Surgical History:  Procedure Laterality Date   APPENDECTOMY  1946   CATARACT EXTRACTION Bilateral    EYE SURGERY Right 07/25/2019   Left ear tube  2001   Removal of pre-cancer bump on neck  2005   Ruptured appendics  1946   Sinus surgery  2008   Patient Active Problem List   Diagnosis Date Noted   Dizziness 09/14/2022   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   Preoperative clearance 06/08/2022   Pain due to onychomycosis of toenails of both feet 03/08/2022   Cancer of lower lip, 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   Vitreous floater, right 11/18/2021   Macular hole of right eye 09/29/2021   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 09/29/2021   Moderate mitral regurgitation 07/06/2021   Lower extremity edema 07/06/2021   Cystoid macular edema of left eye 04/20/2021   Macular pucker, left eye 04/14/2020   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   Sleep apnea, obstructive 12/02/2019   Status post eye surgery 09/04/2019   Superficial keratitis with conjunctivitis, right 08/29/2019   Right epiretinal membrane 08/26/2019   Retinal telangiectasia of right eye 08/26/2019   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  REFERRING DIAG: Malignant neoplasm of neck  THERAPY DIAG:  Secondary malignant neoplasm of cervical lymph node (HCC)  Lymphedema, not elsewhere classified  Abnormal posture  Rationale for Evaluation and Treatment: Rehabilitation  ONSET DATE: 07/2022  SUBJECTIVE:     SUBJECTIVE STATEMENT: I tried the slightly  longer sequence and I think it went well. I was proud of myself yesterday; I got the sequence did and then did some more. I wore my garment a good 4 and a half hours. 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 05/2022 had recurrent malignancy. Pt was offered neck dissection but opted for repeat radiation which was completed on 07/18/2022. Pt had prior PT 11/15/2022, and completed  on 02/01/2024.  PATIENT GOALS:   to be educated about the signs and symptoms of lymphedema and learn  HEP.   PAIN:  Are you having pain? No  PRECAUTIONS: thoracic aneurysm, incontinence,cervical lymphedema  RED FLAGS: Bowel or bladder incontinence: Yes:   and Abdominal aneurysm: Yes:     WEIGHT BEARING RESTRICTIONS: No  FALLS:  Has patient fallen in last 6 months? No   LIVING ENVIRONMENT: Patient lives with:wife Lives in: House/apartment   OCCUPATION: retired  LEISURE: foot bike, arm exercises  PRIOR LEVEL OF FUNCTION: Independent   OBJECTIVE: Note: Objective measures were completed at Evaluation unless otherwise noted.  COGNITION: Overall cognitive status: Within functional limits for tasks assessed                  POSTURE:  Forward head and rounded shoulders posture, slumped sitting posture     SHOULDER AROM:   [facility]   CERVICAL AROM:   Percent limited  Flexion WNL  Extension Dec 50%  Right lateral flexion Dec 60%  Left lateral flexion Dec 60%  Right rotation Dec 40%  Left rotation Dec 50    (Blank rows=not tested)    LYMPHEDEMA ASSESSMENT:      Circumference in cm  4 cm superior to sternal notch around neck 41.5  42.76 cm superior to sternal notch around neck 42.7  8 cm superior to sternal notch around neck 44.5  R lateral nostril from base of nose to medial tragus    L lateral nostril from base of nose to medial tragus    R corner of mouth to where ear lobe meets face 9.0  L corner of mouth to where ear lobe meets face 9.3 cm   left upper mandible swollen greater than right         (Blank rows=not tested)        GAIT: independent, flexed posture, slightly off balance  TREATMENT TODAY 10/19/2023 Therapist performed full Norton Anterior Cervical Sequence in Supine with head of table elevated Discussed pt then performed full shortened sequence he had been given for [facility] Anterior approach Pt seated in chair with lumbar roll behind him.  Therapist read instructions while pt performed. #1,2,3,5, 9,9C,  9 D,  5 and 3. Pt required moderate VC and visiual cues to get hand in proper position to start. Pressure done fairly well, but VC's to slow down at times.   10/16/2023 Discussed pt progress and had pt perform the full shortened sequence he had been given for [facility] Anterior approach Pt seated in chair with lumbar roll behind him. Therapist read instructions while pt performed. #1,2,3,5, 9B. Since he was doing well we added 9C, continued 9 D, and added 5 and 3. He required occasional VC's for pressure and needed occasional VC and visual cues for proper technique but did much better overall. Suggested pt sit in front of full length mirror to do at home. Will add face to  next sequence to get supramandibular swelling on left.  10/11/2023 Pt came at the wrong time and was seen, but was 14 min after the hour Therapist performed all MLD techniques using full Norton Anterior neck sequence with extra emphasis on front of neck, and left mandibular area   10/09/2023 Reviewed poster in my room that demonstrates superficial nature of lymphatics as to why it is important for light touch Performed shortened version of Anterior Norton sequence Short neck, breathing, Bilateral axillary LN activation, anterior chest bilaterally and 9B, 9d. Therapist performed first and had pt return demonstrate in supine with table elevated, and then had pt sit in chair and review all steps again while watching therapist. Pt felt much better after practicing today and pressure was good. Pt had difficulty getting his hand in the right position for 9 B when using same side hand, but did in proper place when reaching across with his other hand.  10/04/23: Self Care Spent a lot of time during session answering pts questions about the lymphatic system and verbally reviewing principles of MLD. Pt is seeming to be able to verbalize a better understanding today than when this  therapist saw him during previous episode of care. He also brought in his H&N compression garment and wanted to review proper donning, placement and amount of tightness.compression from straps.  Manual Therapy MLD: Began with review of modified steps that PT had pt start with at last session so as not to overwhelm him as he had become easily confused during last episode of care. Today he was able to verbalize a better understanding after our initial review today. Then added two more steps for him to cont with self MLD and had him return demo of each step with therapist sitting in front of him demo'ing on self and then repeating on pt, and hand over hand technique used for further reinforcement as follows per [facility] anterior approach handout that pt brought with him: SCF, resisted diaphragmatic breaths, bil axillary nodes, anterior chest bil, cervical nodes (step 9b) and anterior throat (step 9d)  09/27/2023 Discussed importance of tribute garment and MLD to decrease swelling and if swelling appears gone, for prevention because lymphedema can wax and wane depending on weather, activity level etc. Performed Norton anterior sequence in supine reading instructions as I demonstrated and then had pt practice. With practice he had better stretch and lighter pressure. He had some difficulty with Back,Side and front steps( 9 A,B, C) so advised him to do supraclavicular, breathing, axillary regions and central front neck( 9 D) until he returns next visit and we will go over all of the steps again.      PATIENT EDUCATION:  Education details:  importance of posture when sitting, shortened MLD sequence until next visit, showed poster of lymphatics to demonstrate superficial nature and why technique is so gentle.    continued need for MLD and compression due to exacerbation Person educated: Patient Education method: Explanation, Demonstration, Had Handout Education comprehension: Patient verbalized understanding/  return demonstrated some steps of MLD HOME EXERCISE PROGRAM:     ASSESSMENT:  CLINICAL IMPRESSION:  Pt does well after review but still needs reminding where to get started and tends to place his hands to far back on his neck when doing 9 B and C. Overall pressure is good, but sometimes gets going too fast.  Pt will benefit from skilled therapeutic intervention to improve on the following deficits: decreased knowledge of condition, decreased knowledge of use of DME, decreased ROM,  increased edema, increased fascial restrictions, and postural dysfunction   PT treatment/interventions: ADL/self-care home management, pt/family education, therapeutic exercise. Other interventions Therapeutic exercises, Therapeutic activity, Patient/Family education, Self Care, Orthotic/Fit training, Manual lymph drainage, Compression , Taping, Vasopneumatic device, Manual therapy, and Re-evaluation      REHAB POTENTIAL: Good  CLINICAL DECISION MAKING: Stable/uncomplicated  EVALUATION COMPLEXITY: Low   GOALS: Goals reviewed with patient? YES  LONG TERM GOALS: (STG=LTG)   Name Target Date  Goal status  1 Patient will be able to verbalize understanding of a home exercise program for cervical range of motion, and posture,    Baseline:  No knowledge 11/08/2023 NEW  2  Pt will be independent in self MLD for long term management of lymphedema.  11/08/2023 NEW  3 Pt will demonstrate Decrease in edema throughout neck/mandible  11/08/2023 NEW  4         PLAN:  PT FREQUENCY/DURATION: 2x/week x 3 weeks, 1x/week x 3 weeks or 9 visits  PLAN FOR NEXT SESSION:  Cont MLD using Norton anterior approach, review with pt, did he start wearing garment again/how did it go?   Review cervical HEP   Latisha Poland, PT 10/19/2023, 3:01 PM

## 2023-10-25 ENCOUNTER — Ambulatory Visit

## 2023-10-25 DIAGNOSIS — C770 Secondary and unspecified malignant neoplasm of lymph nodes of head, face and neck: Secondary | ICD-10-CM | POA: Diagnosis not present

## 2023-10-25 DIAGNOSIS — R293 Abnormal posture: Secondary | ICD-10-CM

## 2023-10-25 DIAGNOSIS — I890 Lymphedema, not elsewhere classified: Secondary | ICD-10-CM

## 2023-10-25 NOTE — Therapy (Signed)
 OUTPATIENT PHYSICAL THERAPY HEAD AND NECK TREATMENT   Patient Name: Zachary Padilla MRN: [id number] DOB:12-Aug-1928, 89 y.o., male Today's Date: 10/25/2023  END OF SESSION:  PT End of Session - 10/25/23 1208     Visit Number 7    Number of Visits 9    Date for PT Re-Evaluation 11/08/23    Authorization Time Period 09/27/23-10/25/2023    Authorization - Visit Number 7    Authorization - Number of Visits 9    PT Start Time 1209    PT Stop Time 1302    PT Time Calculation (min) 53 min    Activity Tolerance Patient tolerated treatment well    Behavior During Therapy [facility] for tasks assessed/performed          Past Medical History:  Diagnosis Date   ACNE ROSACEA 09/09/2009   ALLERGIC RHINITIS 02/23/2009   Basal cell carcinoma 06/23/2010   R cheek- (CX35FU+ exc )   COLONIC POLYPS, HX OF 02/23/2009   Convulsions/seizures (HCC) 10/24/2014   DIVERTICULITIS, HX OF 02/23/2009   Ear infection    Family history of heart disease    GERD 02/23/2009   Hyperlipidemia    HYPERTENSION 02/23/2009   PONV (postoperative nausea and vomiting)    Posterior vitreous detachment of right eye 08/26/2019   Pre-syncope    Thoracic aortic aneurysm (HCC)    URINARY INCONTINENCE 02/23/2009   Past Surgical History:  Procedure Laterality Date   APPENDECTOMY  1946   CATARACT EXTRACTION Bilateral    EYE SURGERY Right 07/25/2019   Left ear tube  2001   Removal of pre-cancer bump on neck  2005   Ruptured appendics  1946   Sinus surgery  2008   Patient Active Problem List   Diagnosis Date Noted   Dizziness 09/14/2022   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   Preoperative clearance 06/08/2022   Pain due to onychomycosis of toenails of both feet 03/08/2022   Cancer of lower lip, 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   Vitreous floater, right 11/18/2021   Macular hole of right eye 09/29/2021   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 09/29/2021    Moderate mitral regurgitation 07/06/2021   Lower extremity edema 07/06/2021   Cystoid macular edema of left eye 04/20/2021   Macular pucker, left eye 04/14/2020   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   Sleep apnea, obstructive 12/02/2019   Status post eye surgery 09/04/2019   Superficial keratitis with conjunctivitis, right 08/29/2019   Right epiretinal membrane 08/26/2019   Retinal telangiectasia of right eye 08/26/2019   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   Chronic otitis media 08/25/2015   Sensorineural hearing loss 08/25/2015   Convulsions/seizures (HCC) 10/24/2014   Hyperlipidemia 02/13/2013   Thoracic aortic aneurysm (HCC) 02/13/2013   Right bundle branch block 02/13/2013   Nonspecific abnormal electrocardiogram (ECG) (EKG) 01/02/2012   ACNE ROSACEA 09/09/2009   Essential hypertension 02/23/2009   ALLERGIC RHINITIS 02/23/2009   GERD 02/23/2009   URINARY INCONTINENCE 02/23/2009   History of colonic polyps 02/23/2009   DIVERTICULITIS, HX OF 02/23/2009    PCP:   REFERRING PROVIDER: Julio Ohm [facility]  REFERRING DIAG: Malignant neoplasm of neck  THERAPY DIAG:  Secondary malignant neoplasm of cervical lymph node (HCC)  Lymphedema, not elsewhere classified  Abnormal posture  Rationale for Evaluation and Treatment: Rehabilitation  ONSET DATE: 07/2022  SUBJECTIVE:     SUBJECTIVE STATEMENT: My neck swelling is a little better  . I have  done several practices of my MLD and got in more time to tend to it.  I am feeling better about it.  I have been wearing my garment 4-5 hours at a time. My neck looks better after I wear it. My neck is not as saggy as it was.I wake up sometimes at night and get a shooting pain from my jaw into my right ear. I'm not sure what that is so I guess I will go see my ENT.  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 05/2022 had recurrent malignancy. Pt was  offered neck dissection but opted for repeat radiation which was completed on 07/18/2022. Pt had prior PT 11/15/2022, and completed on 02/01/2024.  PATIENT GOALS:   to be educated about the signs and symptoms of lymphedema and learn  HEP.   PAIN:  Are you having pain? No  PRECAUTIONS: thoracic aneurysm, incontinence,cervical lymphedema  RED FLAGS: Bowel or bladder incontinence: Yes:   and Abdominal aneurysm: Yes:     WEIGHT BEARING RESTRICTIONS: No  FALLS:  Has patient fallen in last 6 months? No   LIVING ENVIRONMENT: Patient lives with:wife Lives in: House/apartment   OCCUPATION: retired  LEISURE: foot bike, arm exercises  PRIOR LEVEL OF FUNCTION: Independent   OBJECTIVE: Note: Objective measures were completed at Evaluation unless otherwise noted.  COGNITION: Overall cognitive status: Within functional limits for tasks assessed                  POSTURE:  Forward head and rounded shoulders posture, slumped sitting posture     SHOULDER AROM:   [facility]   CERVICAL AROM:   Percent limited  Flexion WNL  Extension Dec 50%  Right lateral flexion Dec 60%  Left lateral flexion Dec 60%  Right rotation Dec 40%  Left rotation Dec 50    (Blank rows=not tested)    LYMPHEDEMA ASSESSMENT:      Circumference in cm 10/25/2023  4 cm superior to sternal notch around neck 41.5 40.1  6 cm superior to sternal notch around neck 42.7 41.7  8 cm superior to sternal notch around neck 44.5 43  R lateral nostril from base of nose to medial tragus     L lateral nostril from base of nose to medial tragus     R corner of mouth to where ear lobe meets face 9.0   L corner of mouth to where ear lobe meets face 9.3 cm    left upper mandible swollen greater than right           (Blank rows=not tested)        GAIT: independent, flexed posture, slightly off balance  TREATMENT TODAY 10/25/2023  Discussed progress with goals; pt compliant with garment and MLD Pt seated in chair with  lumbar roll behind him. Therapist read instructions while pt performed shortened Norton anterior neck sequence. #1,2,3,5, 9B, 9 D, 5, 3. He required occasional VC's for pressure and needed continued VC and visual cues for proper technique . Measured pts neck after MLD; 1-1.5 cm improvement noted   10/19/2023 Therapist performed full Norton Anterior Cervical Sequence in Supine with head of table elevated Discussed pt then performed full shortened sequence he had been given for [facility] Anterior approach Pt seated in chair with lumbar roll behind him. Therapist read instructions while pt performed. #1,2,3,5, 9,9C,  9 D,  5 and 3. Pt required moderate VC and visual cues  to get hand in proper position to start. Pressure done fairly well, but VC's to slow down at times.   10/16/2023 Discussed pt progress and had pt perform the full shortened sequence he had been given for [facility] Anterior approach Pt seated in chair with lumbar roll behind him. Therapist read instructions while pt performed. #1,2,3,5, 9B. Since he was doing well we added 9C, continued 9 D, and added 5 and 3. He required occasional VC's for pressure and needed occasional VC and visual cues for proper technique but did much better overall. Suggested pt sit in front of full length mirror to do at home. Will add face to next sequence to get supramandibular swelling on left.  10/11/2023 Pt came at the wrong time and was seen, but was 14 min after the hour Therapist performed all MLD techniques using full Norton Anterior neck sequence with extra emphasis on front of neck, and left mandibular area   10/09/2023 Reviewed poster in my room that demonstrates superficial nature of lymphatics as to why it is important for light touch Performed shortened version of Anterior Norton sequence Short neck, breathing, Bilateral axillary LN activation, anterior chest bilaterally and 9B, 9d. Therapist performed first and had pt return demonstrate in supine  with table elevated, and then had pt sit in chair and review all steps again while watching therapist. Pt felt much better after practicing today and pressure was good. Pt had difficulty getting his hand in the right position for 9 B when using same side hand, but did in proper place when reaching across with his other hand.  10/04/23: Self Care Spent a lot of time during session answering pts questions about the lymphatic system and verbally reviewing principles of MLD. Pt is seeming to be able to verbalize a better understanding today than when this therapist saw him during previous episode of care. He also brought in his H&N compression garment and wanted to review proper donning, placement and amount of tightness.compression from straps.  Manual Therapy MLD: Began with review of modified steps that PT had pt start with at last session so as not to overwhelm him as he had become easily confused during last episode of care. Today he was able to verbalize a better understanding after our initial review today. Then added two more steps for him to cont with self MLD and had him return demo of each step with therapist sitting in front of him demo'ing on self and then repeating on pt, and hand over hand technique used for further reinforcement as follows per [facility] anterior approach handout that pt brought with him: SCF, resisted diaphragmatic breaths, bil axillary nodes, anterior chest bil, cervical nodes (step 9b) and anterior throat (step 9d)  09/27/2023 Discussed importance of tribute garment and MLD to decrease swelling and if swelling appears gone, for prevention because lymphedema can wax and wane depending on weather, activity level etc. Performed Norton anterior sequence in supine reading instructions as I demonstrated and then had pt practice. With practice he had better stretch and lighter pressure. He had some difficulty with Back,Side and front steps( 9 A,B, C) so advised him to do  supraclavicular, breathing, axillary regions and central front neck( 9 D) until he returns next visit and we will go over all of the steps again.      PATIENT EDUCATION:  Education details:  importance of posture when sitting, shortened MLD sequence until next visit, showed poster of lymphatics to demonstrate superficial nature and why technique is so  gentle.    continued need for MLD and compression due to exacerbation Person educated: Patient Education method: Explanation, Demonstration, Had Handout Education comprehension: Patient verbalized understanding/ return demonstrated some steps of MLD HOME EXERCISE PROGRAM:     ASSESSMENT:  CLINICAL IMPRESSION:  Pt is compliant with use of his compression garment for neck swelling, and with performing his MLD. He has made improvements with edema, but continues with neck edema and some facial edema and needs cueing with his technique. He does quite well when reminded of proper technique, however, carry over varies at times. He will benefit from an extension of dates so we may use his last 2 visits to review his HEP, and technique for MLD before Discharge. He is compliant and is making progress,  Pt will benefit from skilled therapeutic intervention to improve on the following deficits: decreased knowledge of condition, decreased knowledge of use of DME, decreased ROM, increased edema, increased fascial restrictions, and postural dysfunction   PT treatment/interventions: ADL/self-care home management, pt/family education, therapeutic exercise. Other interventions Therapeutic exercises, Therapeutic activity, Patient/Family education, Self Care, Orthotic/Fit training, Manual lymph drainage, Compression , Taping, Vasopneumatic device, Manual therapy, and Re-evaluation      REHAB POTENTIAL: Good  CLINICAL DECISION MAKING: Stable/uncomplicated  EVALUATION COMPLEXITY: Low   GOALS: Goals reviewed with patient? YES  LONG TERM GOALS:  (STG=LTG)   Name Target Date  Goal status  1 Patient will be able to verbalize understanding of a home exercise program for cervical range of motion, and posture,    Baseline:  No knowledge 11/08/2023 In progress  2  Pt will be independent in self MLD for long term management of lymphedema.  11/08/2023 In Progress; will benefit from review  3 Pt will demonstrate Decrease in edema throughout neck/mandible  11/08/2023 In Progress; improvement noted in neck  4         PLAN:  PT FREQUENCY/DURATION: 2x/week x 3 weeks, 1x/week x 3 weeks or 9 visits  PLAN FOR NEXT SESSION:  Cont MLD using Norton anterior approach, review with pt,    Review cervical HEP   Latisha Poland, PT 10/25/2023, 1:15 PM

## 2023-11-01 ENCOUNTER — Ambulatory Visit

## 2023-11-01 DIAGNOSIS — R293 Abnormal posture: Secondary | ICD-10-CM

## 2023-11-01 DIAGNOSIS — C770 Secondary and unspecified malignant neoplasm of lymph nodes of head, face and neck: Secondary | ICD-10-CM

## 2023-11-01 DIAGNOSIS — I890 Lymphedema, not elsewhere classified: Secondary | ICD-10-CM

## 2023-11-01 NOTE — Therapy (Signed)
 OUTPATIENT PHYSICAL THERAPY HEAD AND NECK TREATMENT   Patient Name: Zachary Padilla MRN: [id number] DOB:07-03-1928, 89 y.o., male Today's Date: 11/01/2023  END OF SESSION:  PT End of Session - 11/01/23 1410     Visit Number 8    Number of Visits 9    Date for PT Re-Evaluation 11/08/23    Authorization Type [employer]    Authorization Time Period 09/27/23-10/25/2023; new dates pending    Authorization - Visit Number 8    Authorization - Number of Visits 9    PT Start Time 1404    PT Stop Time 1503    PT Time Calculation (min) 59 min    Activity Tolerance Patient tolerated treatment well    Behavior During Therapy WFL for tasks assessed/performed          Past Medical History:  Diagnosis Date   ACNE ROSACEA 09/09/2009   ALLERGIC RHINITIS 02/23/2009   Basal cell carcinoma 06/23/2010   R cheek- (CX35FU+ exc )   COLONIC POLYPS, HX OF 02/23/2009   Convulsions/seizures (HCC) 10/24/2014   DIVERTICULITIS, HX OF 02/23/2009   Ear infection    Family history of heart disease    GERD 02/23/2009   Hyperlipidemia    HYPERTENSION 02/23/2009   PONV (postoperative nausea and vomiting)    Posterior vitreous detachment of right eye 08/26/2019   Pre-syncope    Thoracic aortic aneurysm (HCC)    URINARY INCONTINENCE 02/23/2009   Past Surgical History:  Procedure Laterality Date   APPENDECTOMY  1946   CATARACT EXTRACTION Bilateral    EYE SURGERY Right 07/25/2019   Left ear tube  2001   Removal of pre-cancer bump on neck  2005   Ruptured appendics  1946   Sinus surgery  2008   Patient Active Problem List   Diagnosis Date Noted   Dizziness 09/14/2022   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   Preoperative clearance 06/08/2022   Pain due to onychomycosis of toenails of both feet 03/08/2022   Cancer of lower lip, 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   Vitreous floater, right 11/18/2021   Macular hole of right eye 09/29/2021    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 09/29/2021   Moderate mitral regurgitation 07/06/2021   Lower extremity edema 07/06/2021   Cystoid macular edema of left eye 04/20/2021   Macular pucker, left eye 04/14/2020   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   Sleep apnea, obstructive 12/02/2019   Status post eye surgery 09/04/2019   Superficial keratitis with conjunctivitis, right 08/29/2019   Right epiretinal membrane 08/26/2019   Retinal telangiectasia of right eye 08/26/2019   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   Chronic otitis media 08/25/2015   Sensorineural hearing loss 08/25/2015   Convulsions/seizures (HCC) 10/24/2014   Hyperlipidemia 02/13/2013   Thoracic aortic aneurysm (HCC) 02/13/2013   Right bundle branch block 02/13/2013   Nonspecific abnormal electrocardiogram (ECG) (EKG) 01/02/2012   ACNE ROSACEA 09/09/2009   Essential hypertension 02/23/2009   ALLERGIC RHINITIS 02/23/2009   GERD 02/23/2009   URINARY INCONTINENCE 02/23/2009   History of colonic polyps 02/23/2009   DIVERTICULITIS, HX OF 02/23/2009    PCP:   REFERRING PROVIDER: Wyatt Czar [facility]  REFERRING DIAG: Malignant neoplasm of neck  THERAPY DIAG:  Secondary malignant neoplasm of cervical lymph node (HCC)  Lymphedema, not elsewhere classified  Abnormal posture  Rationale for Evaluation and Treatment: Rehabilitation  ONSET DATE: 07/2022  SUBJECTIVE:     SUBJECTIVE STATEMENT: The  steps Grayce has got me doing now are starting to make sense. I just want to review again. And despite the heat the garment is working well also. It just always comes back, but not as bad as it was before. I've started having some pain from the middle of the Lt side of my chin up to my ear and it comes intermittently 5-6/10.  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 05/2022 had recurrent malignancy. Pt was offered neck dissection  but opted for repeat radiation which was completed on 07/18/2022. Pt had prior PT 11/15/2022, and completed on 02/01/2024.  PATIENT GOALS:   to be educated about the signs and symptoms of lymphedema and learn  HEP.   PAIN:  PAIN:  Are you having pain? Yes NPRS scale: 5-6/10 Pain location: chin to ear and zygomatic process Pain orientation: Left  PAIN TYPE: sharp Pain description: intermittent  Aggravating factors: sometimes I feel it when I bend over getting my shoes, but often just comes and goes Relieving factors: nothing, it just comes and goes   PRECAUTIONS: thoracic aneurysm, incontinence,cervical lymphedema  RED FLAGS: Bowel or bladder incontinence: Yes:   and Abdominal aneurysm: Yes:     WEIGHT BEARING RESTRICTIONS: No  FALLS:  Has patient fallen in last 6 months? No   LIVING ENVIRONMENT: Patient lives with:wife Lives in: House/apartment   OCCUPATION: retired  LEISURE: foot bike, arm exercises  PRIOR LEVEL OF FUNCTION: Independent   OBJECTIVE: Note: Objective measures were completed at Evaluation unless otherwise noted.  COGNITION: Overall cognitive status: Within functional limits for tasks assessed                  POSTURE:  Forward head and rounded shoulders posture, slumped sitting posture     SHOULDER AROM:   [facility]   CERVICAL AROM:   Percent limited  Flexion WNL  Extension Dec 50%  Right lateral flexion Dec 60%  Left lateral flexion Dec 60%  Right rotation Dec 40%  Left rotation Dec 50    (Blank rows=not tested)    LYMPHEDEMA ASSESSMENT:      Circumference in cm 10/25/2023  4 cm superior to sternal notch around neck 41.5 40.1  6 cm superior to sternal notch around neck 42.7 41.7  8 cm superior to sternal notch around neck 44.5 43  R lateral nostril from base of nose to medial tragus     L lateral nostril from base of nose to medial tragus     R corner of mouth to where ear lobe meets face 9.0   L corner of mouth to where ear lobe meets  face 9.3 cm    left upper mandible swollen greater than right           (Blank rows=not tested)        GAIT: independent, flexed posture, slightly off balance  TREATMENT TODAY 11/01/23: Self Care Continued with discussion of self MLD and answered pts questions. Encouraged him to update his ENT on new symptoms of pain mentioned above in pain assessment. He agreed.  Then reviewed self MLD per modified Norton sequence ( #1,2,3, 5, 9B, 9 D, 5, 3) doing each step with pt seated face to face and providing hand over hand cuing and demo. Pt still, at times, with too much pressure but does correct fairly well with cuing to lighten pressure. Also is doing better with correct directionality of skin stretches.  10/25/2023  Discussed progress with goals; pt compliant with garment and MLD Pt seated in chair with lumbar roll behind him. Therapist read instructions while pt performed shortened Norton anterior neck sequence. #1,2,3,5, 9B, 9 D, 5, 3. He required occasional VC's for pressure and needed continued VC and visual cues for proper technique . Measured pts neck after MLD; 1-1.5 cm improvement noted   10/19/2023 Therapist performed full Norton Anterior Cervical Sequence in Supine with head of table elevated Discussed pt then performed full shortened sequence he had been given for [facility] Anterior approach Pt seated in chair with lumbar roll behind him. Therapist read instructions while pt performed. #1,2,3,5, 9,9C,  9 D,  5 and 3. Pt required moderate VC and visual cues to get hand in proper position to start. Pressure done fairly well, but VC's to slow down at times.   10/16/2023 Discussed pt progress and had pt perform the full shortened sequence he had been given for [facility] Anterior approach Pt seated in chair with lumbar roll behind him. Therapist read instructions while pt performed. #1,2,3,5, 9B. Since he was doing well we added 9C, continued 9 D, and added 5 and 3. He required occasional  VC's for pressure and needed occasional VC and visual cues for proper technique but did much better overall. Suggested pt sit in front of full length mirror to do at home. Will add face to next sequence to get supramandibular swelling on left.  10/11/2023 Pt came at the wrong time and was seen, but was 14 min after the hour Therapist performed all MLD techniques using full Norton Anterior neck sequence with extra emphasis on front of neck, and left mandibular area   10/09/2023 Reviewed poster in my room that demonstrates superficial nature of lymphatics as to why it is important for light touch Performed shortened version of Anterior Norton sequence Short neck, breathing, Bilateral axillary LN activation, anterior chest bilaterally and 9B, 9d. Therapist performed first and had pt return demonstrate in supine with table elevated, and then had pt sit in chair and review all steps again while watching therapist. Pt felt much better after practicing today and pressure was good. Pt had difficulty getting his hand in the right position for 9 B when using same side hand, but did in proper place when reaching across with his other hand.  10/04/23: Self Care Spent a lot of time during session answering pts questions about the lymphatic system and verbally reviewing principles of MLD. Pt is seeming to be able to verbalize a better understanding today than when this therapist saw him during previous episode of care. He also brought in his H&N compression garment and wanted to review proper donning, placement and amount of tightness.compression from straps.  Manual Therapy MLD: Began with review of modified steps that PT had pt start with at last session so as not to overwhelm him as he had become easily confused during last episode of care. Today he was able to verbalize a better understanding after our initial review today. Then added two more steps for him to cont with self MLD and had him return demo of each  step with therapist sitting in front of him demo'ing on self and then repeating on pt, and hand over hand technique used for further reinforcement as follows per [facility] anterior approach handout that pt brought with him: SCF, resisted diaphragmatic breaths, bil axillary nodes, anterior chest bil, cervical nodes (step 9b) and anterior throat (step 9d)  09/27/2023 Discussed importance of tribute  garment and MLD to decrease swelling and if swelling appears gone, for prevention because lymphedema can wax and wane depending on weather, activity level etc. Performed Norton anterior sequence in supine reading instructions as I demonstrated and then had pt practice. With practice he had better stretch and lighter pressure. He had some difficulty with Back,Side and front steps( 9 A,B, C) so advised him to do supraclavicular, breathing, axillary regions and central front neck( 9 D) until he returns next visit and we will go over all of the steps again.      PATIENT EDUCATION:  Education details:  importance of posture when sitting, shortened MLD sequence until next visit, showed poster of lymphatics to demonstrate superficial nature and why technique is so gentle.    continued need for MLD and compression due to exacerbation Person educated: Patient Education method: Explanation, Demonstration, Had Handout Education comprehension: Patient verbalized understanding/ return demonstrated some steps of MLD HOME EXERCISE PROGRAM:     ASSESSMENT:  CLINICAL IMPRESSION: Pt is making good progress towards independence with self MLD as at each session he has been able to verbalize better understanding of principles of MLD and sequence, correct light pressure (though still needs reinforcement with this) and directionality of skin stretches at each step. He also is wearing his compression garment 3-4 hrs/day and may try sleeping in this at night. He feels he will be ready for D/C at next session next week and knows  he can later return with new order from MD prn.  Pt will benefit from skilled therapeutic intervention to improve on the following deficits: decreased knowledge of condition, decreased knowledge of use of DME, decreased ROM, increased edema, increased fascial restrictions, and postural dysfunction   PT treatment/interventions: ADL/self-care home management, pt/family education, therapeutic exercise. Other interventions Therapeutic exercises, Therapeutic activity, Patient/Family education, Self Care, Orthotic/Fit training, Manual lymph drainage, Compression , Taping, Vasopneumatic device, Manual therapy, and Re-evaluation      REHAB POTENTIAL: Good  CLINICAL DECISION MAKING: Stable/uncomplicated  EVALUATION COMPLEXITY: Low   GOALS: Goals reviewed with patient? YES  LONG TERM GOALS: (STG=LTG)   Name Target Date  Goal status  1 Patient will be able to verbalize understanding of a home exercise program for cervical range of motion, and posture,    Baseline:  No knowledge 11/08/2023 In progress  2  Pt will be independent in self MLD for long term management of lymphedema.  11/08/2023 In Progress; will benefit from review  3 Pt will demonstrate Decrease in edema throughout neck/mandible  11/08/2023 In Progress; improvement noted in neck  4         PLAN:  PT FREQUENCY/DURATION: 2x/week x 3 weeks, 1x/week x 3 weeks or 9 visits  PLAN FOR NEXT SESSION:  D/C next, Cont MLD using Norton anterior approach, review with pt, Review cervical HEP   Aden Berwyn Caldron, PTA 11/01/2023, 4:44 PM

## 2023-11-08 ENCOUNTER — Ambulatory Visit: Attending: Radiology

## 2023-11-08 DIAGNOSIS — I890 Lymphedema, not elsewhere classified: Secondary | ICD-10-CM | POA: Insufficient documentation

## 2023-11-08 DIAGNOSIS — R293 Abnormal posture: Secondary | ICD-10-CM | POA: Diagnosis present

## 2023-11-08 DIAGNOSIS — C770 Secondary and unspecified malignant neoplasm of lymph nodes of head, face and neck: Secondary | ICD-10-CM | POA: Insufficient documentation

## 2023-11-08 NOTE — Therapy (Signed)
 OUTPATIENT PHYSICAL THERAPY HEAD AND NECK TREATMENT   Patient Name: Zachary Padilla MRN: [id number] DOB:[birth year], 89 y.o., male Today's Date: 11/08/2023  END OF SESSION:  PT End of Session - 11/08/23 1219     Visit Number 9    Number of Visits 9    Date for PT Re-Evaluation 11/08/23    Authorization Type [facility] Medicare    Authorization Time Period 619-11/09/2023    Authorization - Visit Number 9    Authorization - Number of Visits 9    PT Start Time 1220   late   PT Stop Time 1302    PT Time Calculation (min) 42 min    Activity Tolerance Patient tolerated treatment well          Past Medical History:  Diagnosis Date   ACNE ROSACEA 09/09/2009   ALLERGIC RHINITIS 02/23/2009   Basal cell carcinoma 06/23/2010   R cheek- (CX35FU+ exc )   COLONIC POLYPS, HX OF 02/23/2009   Convulsions/seizures (HCC) 10/24/2014   DIVERTICULITIS, HX OF 02/23/2009   Ear infection    Family history of heart disease    GERD 02/23/2009   Hyperlipidemia    HYPERTENSION 02/23/2009   PONV (postoperative nausea and vomiting)    Posterior vitreous detachment of right eye 08/26/2019   Pre-syncope    Thoracic aortic aneurysm (HCC)    URINARY INCONTINENCE 02/23/2009   Past Surgical History:  Procedure Laterality Date   APPENDECTOMY  1946   CATARACT EXTRACTION Bilateral    EYE SURGERY Right 07/25/2019   Left ear tube  2001   Removal of pre-cancer bump on neck  2005   Ruptured appendics  1946   Sinus surgery  2008   Patient Active Problem List   Diagnosis Date Noted   Dizziness 09/14/2022   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   Preoperative clearance 06/08/2022   Pain due to onychomycosis of toenails of both feet 03/08/2022   Cancer of lower lip, 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   Vitreous floater, right 11/18/2021   Macular hole of right eye 09/29/2021   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 09/29/2021   Moderate mitral  regurgitation 07/06/2021   Lower extremity edema 07/06/2021   Cystoid macular edema of left eye 04/20/2021   Macular pucker, left eye 04/14/2020   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   Sleep apnea, obstructive 12/02/2019   Status post eye surgery 09/04/2019   Superficial keratitis with conjunctivitis, right 08/29/2019   Right epiretinal membrane 08/26/2019   Retinal telangiectasia of right eye 08/26/2019   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   Chronic otitis media 08/25/2015   Sensorineural hearing loss 08/25/2015   Convulsions/seizures (HCC) 10/24/2014   Hyperlipidemia 02/13/2013   Thoracic aortic aneurysm (HCC) 02/13/2013   Right bundle branch block 02/13/2013   Nonspecific abnormal electrocardiogram (ECG) (EKG) 01/02/2012   ACNE ROSACEA 09/09/2009   Essential hypertension 02/23/2009   ALLERGIC RHINITIS 02/23/2009   GERD 02/23/2009   URINARY INCONTINENCE 02/23/2009   History of colonic polyps 02/23/2009   DIVERTICULITIS, HX OF 02/23/2009    PCP:   REFERRING PROVIDER: Wyatt Czar [facility]  REFERRING DIAG: Malignant neoplasm of neck  THERAPY DIAG:  Secondary malignant neoplasm of cervical lymph node (HCC)  Lymphedema, not elsewhere classified  Abnormal posture  Rationale for Evaluation and Treatment: Rehabilitation  ONSET DATE: 07/2022  SUBJECTIVE:     SUBJECTIVE STATEMENT: Pt is compliant with his MLD and has been doing it daily.  He is wearing his garment consistently 3-4 hours, 1-2 times/day and he feels he gets very good benefit from it. He feels more independent with his self care.  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 05/2022 had recurrent malignancy. Pt was offered neck dissection but opted for repeat radiation which was completed on 07/18/2022. Pt had prior PT 11/15/2022, and completed on 02/01/2024.  PATIENT GOALS:   to be educated about the signs and symptoms of  lymphedema and learn  HEP.   PAIN:  Are you having pain? No, but get occasional pain from the left jaw toward the ear.  PRECAUTIONS: thoracic aneurysm, incontinence,cervical lymphedema  RED FLAGS: Bowel or bladder incontinence: Yes:   and Abdominal aneurysm: Yes:     WEIGHT BEARING RESTRICTIONS: No  FALLS:  Has patient fallen in last 6 months? No   LIVING ENVIRONMENT: Patient lives with:wife Lives in: House/apartment   OCCUPATION: retired  LEISURE: foot bike, arm exercises  PRIOR LEVEL OF FUNCTION: Independent   OBJECTIVE: Note: Objective measures were completed at Evaluation unless otherwise noted.  COGNITION: Overall cognitive status: Within functional limits for tasks assessed                  POSTURE:  Forward head and rounded shoulders posture, slumped sitting posture     SHOULDER AROM:   [facility]   CERVICAL AROM:   Percent limited  Flexion WNL  Extension Dec 50%  Right lateral flexion Dec 60%  Left lateral flexion Dec 60%  Right rotation Dec 40%  Left rotation Dec 50    (Blank rows=not tested)    LYMPHEDEMA ASSESSMENT:      Circumference in cm 10/25/2023  4 cm superior to sternal notch around neck 41.5 40.1  6 cm superior to sternal notch around neck 42.7 41.7  8 cm superior to sternal notch around neck 44.5 43  R lateral nostril from base of nose to medial tragus     L lateral nostril from base of nose to medial tragus     R corner of mouth to where ear lobe meets face 9.0   L corner of mouth to where ear lobe meets face 9.3 cm    left upper mandible swollen greater than right           (Blank rows=not tested)        GAIT: independent, flexed posture, slightly off balance  TREATMENT TODAY 11/08/2023 Continued  discussion of self MLD and answered pts questions. Encouraged him to update his ENT on new symptoms of pain mentioned above in pain assessment.    Reviewed self MLD per modified Norton sequence ( #1,2,3, 5, 9B, 9 D, 5, 3) doing each  step with pt seated face to face and providing hand over hand cuing prn and demo prn. Pt did better modifying his pressure, but tends to get back too far on his neck with 9 b. Corrected easily today Reviewed neck exercises he had been educated in previously. Reviewed cervical and scapular retractions x 5-7 reps each, bilateral Rotation x 5, Bilateral SB x 5, and education in sitting posture with lumbar roll.Pt likes the exercises and will add them back into his program.  11/01/2023 Self Care Continued with discussion of self MLD and answered pts questions. Encouraged him to update his ENT on new symptoms of pain mentioned above in pain assessment. He agreed.  Then reviewed self MLD per modified Erlene  sequence ( #1,2,3, 5, 9B, 9 D, 5, 3) doing each step with pt seated face to face and providing hand over hand cuing and demo. Pt still, at times, with too much pressure but does correct fairly well with cuing to lighten pressure. Also is doing better with correct directionality of skin stretches.    10/25/2023  Discussed progress with goals; pt compliant with garment and MLD Pt seated in chair with lumbar roll behind him. Therapist read instructions while pt performed shortened Norton anterior neck sequence. #1,2,3,5, 9B, 9 D, 5, 3. He required occasional VC's for pressure and needed continued VC and visual cues for proper technique . Measured pts neck after MLD; 1-1.5 cm improvement noted   10/19/2023 Therapist performed full Norton Anterior Cervical Sequence in Supine with head of table elevated Discussed pt then performed full shortened sequence he had been given for [facility] Anterior approach Pt seated in chair with lumbar roll behind him. Therapist read instructions while pt performed. #1,2,3,5, 9,9C,  9 D,  5 and 3. Pt required moderate VC and visual cues to get hand in proper position to start. Pressure done fairly well, but VC's to slow down at times.   10/16/2023 Discussed pt progress and had  pt perform the full shortened sequence he had been given for [facility] Anterior approach Pt seated in chair with lumbar roll behind him. Therapist read instructions while pt performed. #1,2,3,5, 9B. Since he was doing well we added 9C, continued 9 D, and added 5 and 3. He required occasional VC's for pressure and needed occasional VC and visual cues for proper technique but did much better overall. Suggested pt sit in front of full length mirror to do at home. Will add face to next sequence to get supramandibular swelling on left.  10/11/2023 Pt came at the wrong time and was seen, but was 14 min after the hour Therapist performed all MLD techniques using full Norton Anterior neck sequence with extra emphasis on front of neck, and left mandibular area    PATIENT EDUCATION:  Education details:  importance of posture when sitting, shortened MLD sequence until next visit, showed poster of lymphatics to demonstrate superficial nature and why technique is so gentle.    continued need for MLD and compression due to exacerbation Person educated: Patient Education method: Explanation, Demonstration, Had Handout Education comprehension: Patient verbalized understanding/ return demonstrated some steps of MLD HOME EXERCISE PROGRAM:     ASSESSMENT:  CLINICAL IMPRESSION:  Pt continues to be  compliant with use of his compression garment for neck swelling, and with performing his MLD. He has made improvements with edema, but continues with some neck edema and mild facial edema on the left with some reported pain on the left. He has been advised to follow up with his ENT about this.. He has improved with his manual lymphatic drainage techniques and he is more confident with what he is doing. He feels ready to be discharged today.  Pt will benefit from skilled therapeutic intervention to improve on the following deficits: decreased knowledge of condition, decreased knowledge of use of DME, decreased ROM,  increased edema, increased fascial restrictions, and postural dysfunction   PT treatment/interventions: ADL/self-care home management, pt/family education, therapeutic exercise. Other interventions Therapeutic exercises, Therapeutic activity, Patient/Family education, Self Care, Orthotic/Fit training, Manual lymph drainage, Compression , Taping, Vasopneumatic device, Manual therapy, and Re-evaluation      REHAB POTENTIAL: Good  CLINICAL DECISION MAKING: Stable/uncomplicated  EVALUATION COMPLEXITY: Low   GOALS: Goals reviewed with  patient? YES  LONG TERM GOALS: (STG=LTG)   Name Target Date  Goal status  1 Patient will be able to verbalize understanding of a home exercise program for cervical range of motion, and posture,    Baseline:  No knowledge 11/08/2023 MET 11/08/2023  2  Pt will be independent in self MLD for long term management of lymphedema.  11/08/2023 MET 11/08/2023  3 Pt will demonstrate Decrease in edema throughout neck/mandible  11/08/2023 MET 10/25/2023  4         PLAN:  PT FREQUENCY/DURATION: 2x/week x 3 weeks, 1x/week x 3 weeks or 9 visits  PLAN FOR NEXT SESSION:  Cont MLD using Norton anterior approach, review with pt,    Review cervical HEP  Grayce JINNY Sheldon, PT 11/08/2023, 1:05 PM PHYSICAL THERAPY DISCHARGE SUMMARY  Visits from Start of Care: 9  Current functional level related to goals / functional outcomes: Achieved goals established   Remaining deficits: Some left jaw to ear pain;advised to see his ENT   Education / Equipment: HEP, self MLD, use of garment   Patient agrees to discharge. Patient goals were met. Patient is being discharged due to meeting the stated rehab goals.

## 2023-11-08 NOTE — Therapy (Deleted)
 OUTPATIENT PHYSICAL THERAPY HEAD AND NECK TREATMENT   Patient Name: Zachary Padilla MRN: [id number] DOB:[birth year], 89 y.o., male Today's Date: 11/08/2023  END OF SESSION:  PT End of Session - 11/08/23 1219     Visit Number 9    Number of Visits 9    Date for PT Re-Evaluation 11/08/23    Authorization Type [facility] Medicare    Authorization Time Period 619-11/09/2023    Authorization - Visit Number 9    Authorization - Number of Visits 9    PT Start Time 1220   late   Activity Tolerance Patient tolerated treatment well          Past Medical History:  Diagnosis Date   ACNE ROSACEA 09/09/2009   ALLERGIC RHINITIS 02/23/2009   Basal cell carcinoma 06/23/2010   R cheek- (CX35FU+ exc )   COLONIC POLYPS, HX OF 02/23/2009   Convulsions/seizures (HCC) 10/24/2014   DIVERTICULITIS, HX OF 02/23/2009   Ear infection    Family history of heart disease    GERD 02/23/2009   Hyperlipidemia    HYPERTENSION 02/23/2009   PONV (postoperative nausea and vomiting)    Posterior vitreous detachment of right eye 08/26/2019   Pre-syncope    Thoracic aortic aneurysm (HCC)    URINARY INCONTINENCE 02/23/2009   Past Surgical History:  Procedure Laterality Date   APPENDECTOMY  1946   CATARACT EXTRACTION Bilateral    EYE SURGERY Right 07/25/2019   Left ear tube  2001   Removal of pre-cancer bump on neck  2005   Ruptured appendics  1946   Sinus surgery  2008   Patient Active Problem List   Diagnosis Date Noted   Dizziness 09/14/2022   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   Preoperative clearance 06/08/2022   Pain due to onychomycosis of toenails of both feet 03/08/2022   Cancer of lower lip, 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   Vitreous floater, right 11/18/2021   Macular hole of right eye 09/29/2021   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 09/29/2021   Moderate mitral regurgitation 07/06/2021   Lower extremity edema 07/06/2021    Cystoid macular edema of left eye 04/20/2021   Macular pucker, left eye 04/14/2020   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   Sleep apnea, obstructive 12/02/2019   Status post eye surgery 09/04/2019   Superficial keratitis with conjunctivitis, right 08/29/2019   Right epiretinal membrane 08/26/2019   Retinal telangiectasia of right eye 08/26/2019   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   Chronic otitis media 08/25/2015   Sensorineural hearing loss 08/25/2015   Convulsions/seizures (HCC) 10/24/2014   Hyperlipidemia 02/13/2013   Thoracic aortic aneurysm (HCC) 02/13/2013   Right bundle branch block 02/13/2013   Nonspecific abnormal electrocardiogram (ECG) (EKG) 01/02/2012   ACNE ROSACEA 09/09/2009   Essential hypertension 02/23/2009   ALLERGIC RHINITIS 02/23/2009   GERD 02/23/2009   URINARY INCONTINENCE 02/23/2009   History of colonic polyps 02/23/2009   DIVERTICULITIS, HX OF 02/23/2009    PCP:   REFERRING PROVIDER: Wyatt Czar [facility]  REFERRING DIAG: Malignant neoplasm of neck  THERAPY DIAG:  Secondary malignant neoplasm of cervical lymph node (HCC)  Lymphedema, not elsewhere classified  Abnormal posture  Rationale for Evaluation and Treatment: Rehabilitation  ONSET DATE: 07/2022  SUBJECTIVE:     SUBJECTIVE STATEMENT: I'm having a problem with my back. The doctor gave me some exercises. I still have some pain in the left jaw area and feel like maybe I  have a bad toothe. I have done my MLD every day. I usually start too heavy but then I correct it. I wear the garment every day for atleast 3 hours and sometimes 2x/day My neck swelling is a little better  . I have done several practices of my MLD and got in more time to tend to it.  I am feeling better about it.  I have been wearing my garment 4-5 hours at a time. My neck looks better after I wear it. My neck is not as saggy as it was.I wake up sometimes at night and get a shooting pain from my jaw into my right ear. I'm  not sure what that is so I guess I will go see my ENT.  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 05/2022 had recurrent malignancy. Pt was offered neck dissection but opted for repeat radiation which was completed on 07/18/2022. Pt had prior PT 11/15/2022, and completed on 02/01/2024.  PATIENT GOALS:   to be educated about the signs and symptoms of lymphedema and learn  HEP.   PAIN:  Are you having pain? No  PRECAUTIONS: thoracic aneurysm, incontinence,cervical lymphedema  RED FLAGS: Bowel or bladder incontinence: Yes:   and Abdominal aneurysm: Yes:     WEIGHT BEARING RESTRICTIONS: No  FALLS:  Has patient fallen in last 6 months? No   LIVING ENVIRONMENT: Patient lives with:wife Lives in: House/apartment   OCCUPATION: retired  LEISURE: foot bike, arm exercises  PRIOR LEVEL OF FUNCTION: Independent   OBJECTIVE: Note: Objective measures were completed at Evaluation unless otherwise noted.  COGNITION: Overall cognitive status: Within functional limits for tasks assessed                  POSTURE:  Forward head and rounded shoulders posture, slumped sitting posture     SHOULDER AROM:   [facility]   CERVICAL AROM:   Percent limited  Flexion WNL  Extension Dec 50%  Right lateral flexion Dec 60%  Left lateral flexion Dec 60%  Right rotation Dec 40%  Left rotation Dec 50    (Blank rows=not tested)    LYMPHEDEMA ASSESSMENT:      Circumference in cm 10/25/2023  4 cm superior to sternal notch around neck 41.5 40.1  6 cm superior to sternal notch around neck 42.7 41.7  8 cm superior to sternal notch around neck 44.5 43  R lateral nostril from base of nose to medial tragus     L lateral nostril from base of nose to medial tragus     R corner of mouth to where ear lobe meets face 9.0   L corner of mouth to where ear lobe meets face 9.3 cm    left upper mandible swollen greater than right            (Blank rows=not tested)        GAIT: independent, flexed posture, slightly off balance  TREATMENT TODAY  11/08/2023    10/25/2023  Discussed progress with goals; pt compliant with garment and MLD Pt seated in chair with lumbar roll behind him. Therapist read instructions while pt performed shortened Norton anterior neck sequence. #1,2,3,5, 9B, 9 D, 5, 3. He required occasional VC's for pressure and needed continued VC and visual cues for proper technique . Measured pts neck after MLD; 1-1.5 cm improvement noted   10/19/2023 Therapist performed full Norton Anterior Cervical Sequence in Supine with  head of table elevated Discussed pt then performed full shortened sequence he had been given for [facility] Anterior approach Pt seated in chair with lumbar roll behind him. Therapist read instructions while pt performed. #1,2,3,5, 9,9C,  9 D,  5 and 3. Pt required moderate VC and visual cues to get hand in proper position to start. Pressure done fairly well, but VC's to slow down at times.   10/16/2023 Discussed pt progress and had pt perform the full shortened sequence he had been given for [facility] Anterior approach Pt seated in chair with lumbar roll behind him. Therapist read instructions while pt performed. #1,2,3,5, 9B. Since he was doing well we added 9C, continued 9 D, and added 5 and 3. He required occasional VC's for pressure and needed occasional VC and visual cues for proper technique but did much better overall. Suggested pt sit in front of full length mirror to do at home. Will add face to next sequence to get supramandibular swelling on left.  10/11/2023 Pt came at the wrong time and was seen, but was 14 min after the hour Therapist performed all MLD techniques using full Norton Anterior neck sequence with extra emphasis on front of neck, and left mandibular area   10/09/2023 Reviewed poster in my room that demonstrates superficial nature of lymphatics as to why it is important  for light touch Performed shortened version of Anterior Norton sequence Short neck, breathing, Bilateral axillary LN activation, anterior chest bilaterally and 9B, 9d. Therapist performed first and had pt return demonstrate in supine with table elevated, and then had pt sit in chair and review all steps again while watching therapist. Pt felt much better after practicing today and pressure was good. Pt had difficulty getting his hand in the right position for 9 B when using same side hand, but did in proper place when reaching across with his other hand.  10/04/23: Self Care Spent a lot of time during session answering pts questions about the lymphatic system and verbally reviewing principles of MLD. Pt is seeming to be able to verbalize a better understanding today than when this therapist saw him during previous episode of care. He also brought in his H&N compression garment and wanted to review proper donning, placement and amount of tightness.compression from straps.  Manual Therapy MLD: Began with review of modified steps that PT had pt start with at last session so as not to overwhelm him as he had become easily confused during last episode of care. Today he was able to verbalize a better understanding after our initial review today. Then added two more steps for him to cont with self MLD and had him return demo of each step with therapist sitting in front of him demo'ing on self and then repeating on pt, and hand over hand technique used for further reinforcement as follows per [facility] anterior approach handout that pt brought with him: SCF, resisted diaphragmatic breaths, bil axillary nodes, anterior chest bil, cervical nodes (step 9b) and anterior throat (step 9d)  09/27/2023 Discussed importance of tribute garment and MLD to decrease swelling and if swelling appears gone, for prevention because lymphedema can wax and wane depending on weather, activity level etc. Performed Norton anterior sequence  in supine reading instructions as I demonstrated and then had pt practice. With practice he had better stretch and lighter pressure. He had some difficulty with Back,Side and front steps( 9 A,B, C) so advised him to do supraclavicular, breathing, axillary regions and central front neck( 9 D)  until he returns next visit and we will go over all of the steps again.      PATIENT EDUCATION:  Education details:  importance of posture when sitting, shortened MLD sequence until next visit, showed poster of lymphatics to demonstrate superficial nature and why technique is so gentle.    continued need for MLD and compression due to exacerbation Person educated: Patient Education method: Explanation, Demonstration, Had Handout Education comprehension: Patient verbalized understanding/ return demonstrated some steps of MLD HOME EXERCISE PROGRAM:     ASSESSMENT:  CLINICAL IMPRESSION:  Pt is compliant with use of his compression garment for neck swelling, and with performing his MLD. He has made improvements with edema, but continues with neck edema and some facial edema and needs cueing with his technique. He does quite well when reminded of proper technique, however, carry over varies at times. He will benefit from an extension of dates so we may use his last 2 visits to review his HEP, and technique for MLD before Discharge. He is compliant and is making progress,  Pt will benefit from skilled therapeutic intervention to improve on the following deficits: decreased knowledge of condition, decreased knowledge of use of DME, decreased ROM, increased edema, increased fascial restrictions, and postural dysfunction   PT treatment/interventions: ADL/self-care home management, pt/family education, therapeutic exercise. Other interventions Therapeutic exercises, Therapeutic activity, Patient/Family education, Self Care, Orthotic/Fit training, Manual lymph drainage, Compression , Taping, Vasopneumatic device,  Manual therapy, and Re-evaluation      REHAB POTENTIAL: Good  CLINICAL DECISION MAKING: Stable/uncomplicated  EVALUATION COMPLEXITY: Low   GOALS: Goals reviewed with patient? YES  LONG TERM GOALS: (STG=LTG)   Name Target Date  Goal status  1 Patient will be able to verbalize understanding of a home exercise program for cervical range of motion, and posture,    Baseline:  No knowledge 11/08/2023 MET  11/08/2023  2  Pt will be independent in self MLD for long term management of lymphedema.  11/08/2023 MET 11/08/2023  3 Pt will demonstrate Decrease in edema throughout neck/mandible  11/08/2023 MET 10/25/2023  4         PLAN:  PT FREQUENCY/DURATION: 2x/week x 3 weeks, 1x/week x 3 weeks or 9 visits  PLAN FOR NEXT SESSION:  Cont MLD using Norton anterior approach, review with pt,    Review cervical HEP   Grayce JINNY Sheldon, PT 11/08/2023, 12:20 PM

## 2023-11-09 ENCOUNTER — Telehealth: Payer: Self-pay

## 2023-11-09 NOTE — Telephone Encounter (Addendum)
 Patient called and states he was having dizziness, ear pain, numbness around ear and jaw area. Notified Dr. Charisse PA Leeroy. Leeroy recommended patient be evaluated in the Emergency. Called patient again and advised he go to the ED to be evaluated. Called  ENT on call office to notify them of what was going on.

## 2023-11-17 NOTE — Progress Notes (Signed)
 Otolaryngology Clinic Note  HPI:    Follow-up (Pt presents for swelling on the left side of face associated with pain )    Zachary Padilla is a 89 y.o. male who presents as a return patient, for evaluation and treatment of left-sided otalgia and left-sided facial swelling and pain.  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    He had Mohs surgery with Dr. Lloyd recently on the face, left side, 7-8 months ago.  He reports that following this, his razor caught a stitch on his left face, and he has concerns that this may have contributed to his present symptoms.  He reports swelling and discomfort over the left side of his lower face.  He also reports pain and numbness in the ear canal.  He reports significant pain and sensitivity in his teeth.  PMH/Meds/All/SocHx/FamHx/ROS:   Medical History[1]  Surgical History[2]  No family history of bleeding disorders, wound healing problems or difficulty with anesthesia.   Social History   Socioeconomic History  . Marital status: Married    Spouse name: Not on file  . Number of children: Not on file  . Years of education: Not on file  . Highest education level: Not on file  Occupational History  . Not on file  Tobacco Use  . Smoking status: Never  . Smokeless tobacco: Never  Substance and Sexual Activity  . Alcohol use: No  . Drug use: Never  . Sexual activity: Not on file  Other Topics Concern  . Not on file  Social History Narrative  . Not on file   Social Drivers of Health   Food Insecurity: No Food Insecurity (02/28/2023)   Received from [facility]   Food vital sign   . Within the past 12 months, you worried that your food would run out before you got money to buy more: Never true   . Within the past 12 months, the food you bought just didn't last and you didn't have money to get more: Never true  Transportation Needs: No Transportation Needs (02/28/2023)    Received from [facility] - Transportation   . Lack of Transportation (Medical): No   . Lack of Transportation (Non-Medical): No  Safety: Not At Risk (02/28/2023)   Received from [facility]   Safety   . Within the last year, have you been afraid of your partner or ex-partner?: No   . Within the last year, have you been humiliated or emotionally abused in other ways by your partner or ex-partner?: No   . Within the last year, have you been kicked, hit, slapped, or otherwise physically hurt by your partner or ex-partner?: No   . Within the last year, have you been raped or forced to have any kind of sexual activity by your partner or ex-partner?: No  Living Situation: Not on file    Current Medications[3]  A complete ROS was performed with pertinent positives/negatives noted in the HPI. The remainder of the ROS are negative.    Physical Exam:    BP 127/72   Pulse 70   Temp 97.7 F (36.5 C) (Temporal)   Ht 1.778 m (5' 10)   Wt 68 kg (150 lb)   BMI 21.52 kg/m    General: Well developed, well nourished. No acute distress.   Head/Face: Normocephalic, atraumatic. No scars or lesions. No sinus tenderness. Facial nerve intact and equal bilaterally.  Eyes: Pupils are equal, round and reactive to light. Conjunctiva and lids are normal. Normal extraocular mobility.   Ears:   Right: Pinna and external meatus normal, normal ear canal skin and caliber without excessive cerumen or drainage. Tympanic membrane intact without effusion or infection.    Left: Pinna and external meatus normal, normal ear canal skin and caliber without excessive cerumen or drainage. Tympanic membrane intact without effusion or infection.   Nose: No gross deformity or lesions. No purulent discharge.   Mouth/Oropharynx: Lips normal with no evidence of recurrent lesion.  There is a firm, well-circumscribed mass lesion on the mandibular alveolus, anterior to tooth #21, overlying mucosa appears normal  Larynx:  See TFL if applicable  Nasopharynx: See TFL if applicable  Neck: Trachea midline. No masses. No crepitus. Normal thyroid  glad palpation without thyromegaly or nodules palpated. Salivary glands normal to palpation without swelling, erythema or mass.   Lymphatic: No lymphadenopathy in the neck.  Respiratory: No stridor or distress.  Extremities: No edema or cyanosis. Warm and well-perfused.  Neurologic: CN II-XII intact. Alert and oriented to self, place and time.  Normal reflexes and motor skills, balance and coordination. Moving all extremities without gross abnormality.  Psychiatric:  No unusual anxiety or evidence of depression. Appropriate affect.    Independent Review of Additional Tests or Records:  Documentation from previous ENT visit reviewed,  Procedures:   Binocular Microscopy  Date/Time: 11/17/2023 1:00 PM  Performed by: Gerard Jenkins Shope, DO Authorized by: Gerard Jenkins Shope, DO  Comments: Procedure: Otomicroscopy, bilateral ears  Findings: As per physical exam findings  Details: The patient was placed in a semirecumbent position.  Microscope was utilized with binocular to visualize bilateral tympanic membranes.  Patient tolerated the procedure well with no complications and findings as above.      Impression & Plans:  Zachary Padilla is a 89 y.o. male with hx of numerable skin cancers s/p RT For lip and neck SCC presenting for evaluation of left low facial pain and intermittent otalgia.  Complete head neck examination today demonstrates a palpable firm nodularity of the left mandibular alveolus, anterior to tooth #1, which is tender to palpation.  There is mild lymphedema present, but no evidence of cutaneous abnormality of the lower lip or face.  Otomicroscopy is unremarkable, suspect patient's otalgia is referred in nature.  Given history, I recommend further evaluation with a dedicated CT neck.  This will be done without contrast due to patient's history.  Further  recommendations pending results of imaging.   Meghan Jenkins Shope, DO Otolaryngology         [1] Past Medical History: Diagnosis Date  . Aneurysm   . Asthma (CMD)   . Hypertension   . 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 Seizure    (CMD)   . Stage 3 chronic kidney disease (CMD)   . 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 APPENDECTOMY     Procedure: APPENDECTOMY  . CATARACT EXTRACTION Bilateral    Procedure: CATARACT EXTRACTION; shapiro  . CATARACT EXTRACTION Right 07/05/2017   Procedure: YAG CAPSULOTOMY  . CATARACT EXTRACTION Left 07/12/2017   Procedure: YAG CAPSULOTOMY  . OTHER SURGICAL HISTORY     Procedure: OTHER SURGICAL HISTORY (skin cancer surgery); cheek,nose lip and under the neck  . SINUS SURGERY     Procedure: SINUS SURGERY  [3]  Current Outpatient Medications:  .  azelaic acid (Finacea) 15 % gel, 1 application.., Disp: , Rfl:  .  cholecalciferol (VITAMIN D3) 1,000 unit (25 mcg) tablet, Take 1,000 Units by mouth Once Daily., Disp: , Rfl:  .  cyanocobalamin (VITAMIN B12) 50 mcg tablet, Take 50 mcg by mouth daily., Disp: , Rfl:  .  fluorouraciL (EFUDEX) 5 % cream, Apply topically., Disp: , Rfl:  .  furosemide (LASIX) 20 mg tablet, Take 20 mg by mouth Once Daily., Disp: , Rfl:  .  guaiFENesin (Mucinex) 600 mg 12 hr tablet, 1 tablet., Disp: , Rfl:  .  levETIRAcetam  (KEPPRA ) 500 mg tablet, TAKE ONE HALF IN THE AM, AND ONE TABLET AT NIGHT, Disp: , Rfl:  .  loteprednol  (Lotemax ) 0.5 % drps ophthalmic suspension, 1 drop., Disp: , Rfl:  .  mupirocin (BACTROBAN) 2 % ointment, Apply to inside of each side of nose 2 times / day; squeeze nasal tip to help disperse medication after applying, Disp: 22 g, Rfl: 3

## 2023-11-22 NOTE — Telephone Encounter (Signed)
 Pt called in to check on scheduling imaging that was recommended at his last visit.Please call him at [phone].

## 2023-11-22 NOTE — Telephone Encounter (Signed)
 I spoke with the pt to help him get scheduled with imaging.

## 2023-11-23 ENCOUNTER — Ambulatory Visit (INDEPENDENT_AMBULATORY_CARE_PROVIDER_SITE_OTHER): Admitting: Podiatry

## 2023-11-23 ENCOUNTER — Encounter: Payer: Self-pay | Admitting: Podiatry

## 2023-11-23 DIAGNOSIS — L608 Other nail disorders: Secondary | ICD-10-CM | POA: Diagnosis not present

## 2023-11-23 DIAGNOSIS — M79675 Pain in left toe(s): Secondary | ICD-10-CM | POA: Diagnosis not present

## 2023-11-23 DIAGNOSIS — M79674 Pain in right toe(s): Secondary | ICD-10-CM

## 2023-11-23 DIAGNOSIS — B351 Tinea unguium: Secondary | ICD-10-CM | POA: Diagnosis not present

## 2023-11-23 LAB — LAB REPORT - SCANNED: EGFR: 49

## 2023-11-23 NOTE — Progress Notes (Signed)
 Patient presents to the office concerned about both big toenails.  He says they are painful in shoes. He presents for evaluation and treatment  General Appearance  Alert, conversant and in no acute stress.  Vascular  Dorsalis pedis and posterior tibial  pulses are weakly palpable  bilaterally.  Capillary return is within normal limits  bilaterally. Temperature is within normal limits  bilaterally.  Neurologic  Senn-Weinstein monofilament wire test within normal limits  bilaterally. Muscle power within normal limits bilaterally.  Nails Thick disfigured discolored nails with subungual debris  from hallux to fifth toes bilaterally. No evidence of bacterial infection or drainage bilaterally. Pincer hallux nails  B/L.  Orthopedic  No limitations of motion  feet .  No crepitus or effusions noted.  No bony pathology or digital deformities noted.  Skin  normotropic skin with no porokeratosis noted bilaterally.  No signs of infections or ulcers noted.     Pincer nails  Discussed condition with patient.  Told him his nail borders are growing into the nail grooves due to his pincer toanil deformity.  RTC 10 weeks  Ruffin Cotton DPM

## 2023-11-29 ENCOUNTER — Other Ambulatory Visit: Payer: Self-pay | Admitting: Adult Health

## 2023-12-13 ENCOUNTER — Ambulatory Visit: Payer: Medicare Other | Admitting: Adult Health

## 2024-01-25 NOTE — Progress Notes (Unsigned)
 PATIENT: Zachary Padilla DOB: 1929/01/15  REASON FOR VISIT: follow up HISTORY FROM: patient  No chief complaint on file.    HISTORY OF PRESENT ILLNESS: Today 01/25/24:  12/13/22: Zachary Padilla is a 89 y.o. male with a history of seizures. Returns today for follow-up. Remains on Keppra  250 mg in the AM and 500 mg in the PM. No seizures. Overall doing well. Spoke about weaning off Keppra  but unsure about stopping driving at this time.   09/20/21: Zachary Padilla is a 89 year old male with a history of seizures. He returns today. Continues on Keppra  250 mg in the AM and 500 mg at PM. Denies any seizures events. Stage 3 CKD followed by nephrology. No new symptoms  02/19/21:Zachary Padilla is a 89 y.o. male with a history of seizures, he is here today for a follow up. He denies and seizure events. He denies excess fatigue or drowsiness. Denies any falls or changes in his memory. He continues to operate a motor vehicle and he is living at home with wife.   He denies any issues or concerns at this time. He is having issues with his kidneys and reports he is stage 3 CKD. He is having difficulties finding foods that are appropriate for this. His concern is if the Keppra  effects his kidneys. We informed that patient that this medication is ok at the current dose. He is seeing a urologist.   HISTORY  02/19/20: Zachary Padilla is an 89 year old male with a history of seizures.  He returns today for follow-up.  He denies any seizure events.  Continues on Keppra  250 mg in the morning and 500 in the evening.  Does report that he has stage III kidney disease.  Overall he feels that he is doing well.  Returns today for evaluation.   08/20/19: Zachary Padilla is an 89 year old male with a history of seizures.  He returns today for follow-up.  He denies any seizure events.  Continues on Keppra  250 mg in the morning and 500 mg in the evening.  He denies any further changes with his memory.  He is able to complete all ADLs independently.   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  He returns today for an evaluation.    REVIEW OF SYSTEMS: Out of a complete 14 system review of symptoms, the patient complains only of the following symptoms, and all other reviewed systems are negative.  ALLERGIES: Allergies  Allergen Reactions   Ace Inhibitors Other (See Comments)    unknown   Aminoglycosides     Unknown reaction   Erythromycin Nausea And Vomiting   Iodinated Contrast Media     Cannot Have do to decreased kidney function.    Levofloxacin Other (See Comments)    dehydration   Penicillins Nausea And Vomiting   Sulfa Antibiotics     Unknown reaction    Ciprofloxacin Hives and Rash    hives    HOME MEDICATIONS: Outpatient Medications Prior to Visit  Medication Sig Dispense Refill   cholecalciferol (VITAMIN D3) 25 MCG (1000 UNIT) tablet Take 1,000 Units by mouth daily.     fluorouracil (EFUDEX) 5 % cream Apply topically.     furosemide (LASIX) 20 MG tablet Take 20 mg by mouth daily.     levETIRAcetam  (KEPPRA ) 500 MG tablet TAKE 1/2 TABLET IN THE MORNING AND TAKE ONE TABLET IN THE EVENING 135 tablet 0   SIMPLY SALINE NA Place 1 spray into the nose 2 (two) times daily.  vitamin B-12 (CYANOCOBALAMIN) 50 MCG tablet Take 50 mcg by mouth daily.     No facility-administered medications prior to visit.    PAST MEDICAL HISTORY: Past Medical History:  Diagnosis Date   ACNE ROSACEA 09/09/2009   ALLERGIC RHINITIS 02/23/2009   Basal cell carcinoma 06/23/2010   R cheek- (CX35FU+ exc )   COLONIC POLYPS, HX OF 02/23/2009   Convulsions/seizures (HCC) 10/24/2014   DIVERTICULITIS, HX OF 02/23/2009   Ear infection    Family history of heart disease    GERD 02/23/2009   Hyperlipidemia    HYPERTENSION 02/23/2009   PONV (postoperative nausea and vomiting)    Posterior vitreous detachment of right eye 08/26/2019   Pre-syncope    Thoracic aortic aneurysm (HCC)    URINARY INCONTINENCE 02/23/2009    PAST SURGICAL HISTORY: Past Surgical  History:  Procedure Laterality Date   APPENDECTOMY  1946   CATARACT EXTRACTION Bilateral    EYE SURGERY Right 07/25/2019   Left ear tube  2001   Removal of pre-cancer bump on neck  2005   Ruptured appendics  1946   Sinus surgery  2008    FAMILY HISTORY: Family History  Problem Relation Age of Onset   Heart disease Mother    Stroke Father    Heart disease Father    Cancer Sister    Cancer Brother    Heart disease Other    Stroke Other    Seizures Neg Hx     SOCIAL HISTORY: Social History   Socioeconomic History   Marital status: Married    Spouse name: Not on file   Number of children: 2   Years of education: 15   Highest education level: Not on file  Occupational History   Occupation: retired  Tobacco Use   Smoking status: Former   Smokeless tobacco: Never   Tobacco comments:    Lives with spouse-independant in all ADLs. [employer] and [employer] at [employer], Married in 54  Vaping Use   Vaping status: Never Used  Substance and Sexual Activity   Alcohol use: No   Drug use: No   Sexual activity: Yes  Other Topics Concern   Not on file  Social History Narrative   Patient occasionally drinks tea.   Patient is right handed.   Social Drivers of Corporate investment banker Strain: Not on file  Food Insecurity: No Food Insecurity (02/28/2023)   Hunger Vital Sign    Worried About Running Out of Food in the Last Year: Never true    Ran Out of Food in the Last Year: Never true  Transportation Needs: No Transportation Needs (02/28/2023)   PRAPARE - Administrator, Civil Service (Medical): No    Lack of Transportation (Non-Medical): No  Physical Activity: Not on file  Stress: Not on file  Social Connections: Not on file  Intimate Partner Violence: Not At Risk (02/28/2023)   Humiliation, Afraid, Rape, and Kick questionnaire    Fear of Current or Ex-Partner: No    Emotionally Abused: No    Physically Abused: No    Sexually  Abused: No     PHYSICAL EXAM  There were no vitals filed for this visit.  There is no height or weight on file to calculate BMI.  Generalized: Well developed, in no acute distress   Neurological examination  Mentation: Alert oriented to time, place, history taking. Follows all commands speech and language fluent Cranial nerve II-XII: Pupils were equal round reactive to light.  Extraocular movements were full, visual field were full on confrontational test. Facial sensation and strength were normal.  Head turning and shoulder shrug  were normal and symmetric. Motor: The motor testing reveals 5 over 5 strength of all 4 extremities. Good symmetric motor tone is noted throughout.  Sensory: Sensory testing is intact to soft touch on all 4 extremities. No evidence of extinction is noted.  Coordination: Cerebellar testing reveals good finger-nose-finger and heel-to-shin bilaterally.  Gait and station: Gait is normal.   DIAGNOSTIC DATA (LABS, IMAGING, TESTING) - I reviewed patient records, labs, notes, testing and imaging myself where available.  Lab Results  Component Value Date   WBC 5.5 03/22/2016   HGB 14.3 03/22/2016   HCT 41.9 03/22/2016   MCV 96.5 03/22/2016   PLT 160 03/22/2016      Component Value Date/Time   NA 134 (L) 03/22/2016 1128   K 4.7 03/22/2016 1128   CL 101 03/22/2016 1128   CO2 27 03/22/2016 1128   GLUCOSE 80 03/22/2016 1128   BUN 17 03/22/2016 1128   CREATININE 1.27 (H) 03/22/2016 1128   CALCIUM 8.7 03/22/2016 1128   PROT 6.6 03/22/2016 1128   ALBUMIN 3.9 03/22/2016 1128   AST 22 03/22/2016 1128   ALT 15 03/22/2016 1128   ALKPHOS 73 03/22/2016 1128   BILITOT 1.0 03/22/2016 1128   GFRNONAA 51 (L) 03/22/2016 1128   GFRAA 59 (L) 03/22/2016 1128   Lab Results  Component Value Date   CHOL 123 03/22/2016   HDL 50 03/22/2016   LDLCALC 63 03/22/2016   TRIG 52 03/22/2016   CHOLHDL 2.5 03/22/2016   No results found for: HGBA1C No results found for:  VITAMINB12 Lab Results  Component Value Date   TSH 1.760 09/05/2023     ASSESSMENT AND PLAN 89 y.o. year old male  has a past medical history of ACNE ROSACEA (09/09/2009), ALLERGIC RHINITIS (02/23/2009), Basal cell carcinoma (06/23/2010), COLONIC POLYPS, HX OF (02/23/2009), Convulsions/seizures (HCC) (10/24/2014), DIVERTICULITIS, HX OF (02/23/2009), Ear infection, Family history of heart disease, GERD (02/23/2009), Hyperlipidemia, HYPERTENSION (02/23/2009), PONV (postoperative nausea and vomiting), Posterior vitreous detachment of right eye (08/26/2019), Pre-syncope, Thoracic aortic aneurysm (HCC), and URINARY INCONTINENCE (02/23/2009). here with   Convulsions; unspecified type  -- Continue Keppra  250 mg in the morning and 500 mg in the evening. -- Advised to call if he has worsening or new symptoms -- FU in 1 year or sooner if needed    Duwaine Russell, MSN, NP-C 01/25/2024, 3:55 PM [facility] [address] [place], [place] [address] [phone]

## 2024-01-26 ENCOUNTER — Encounter: Payer: Self-pay | Admitting: Adult Health

## 2024-01-26 ENCOUNTER — Ambulatory Visit (INDEPENDENT_AMBULATORY_CARE_PROVIDER_SITE_OTHER): Admitting: Adult Health

## 2024-01-26 VITALS — BP 137/69 | HR 58 | Ht 70.0 in | Wt 150.6 lb

## 2024-01-26 DIAGNOSIS — R569 Unspecified convulsions: Secondary | ICD-10-CM

## 2024-01-26 NOTE — Patient Instructions (Signed)
Your Plan:  Continue Keppra  If your symptoms worsen or you develop new symptoms please let us know.    Thank you for coming to see us at [facility]. I hope we have been able to provide you high quality care today.  You may receive a patient satisfaction survey over the next few weeks. We would appreciate your feedback and comments so that we may continue to improve ourselves and the health of our patients.  

## 2024-02-19 ENCOUNTER — Other Ambulatory Visit: Payer: Self-pay | Admitting: Adult Health

## 2024-02-21 ENCOUNTER — Other Ambulatory Visit: Payer: Self-pay | Admitting: *Deleted

## 2024-02-21 MED ORDER — LEVETIRACETAM 500 MG PO TABS: ORAL_TABLET | ORAL | 4 refills | Status: AC

## 2024-02-27 ENCOUNTER — Ambulatory Visit: Admitting: Podiatry

## 2024-02-27 ENCOUNTER — Encounter: Payer: Self-pay | Admitting: Podiatry

## 2024-02-27 DIAGNOSIS — B351 Tinea unguium: Secondary | ICD-10-CM

## 2024-02-27 DIAGNOSIS — M79675 Pain in left toe(s): Secondary | ICD-10-CM | POA: Diagnosis not present

## 2024-02-27 DIAGNOSIS — L608 Other nail disorders: Secondary | ICD-10-CM | POA: Diagnosis not present

## 2024-02-27 DIAGNOSIS — M79674 Pain in right toe(s): Secondary | ICD-10-CM | POA: Diagnosis not present

## 2024-02-27 NOTE — Progress Notes (Signed)
 Patient presents to the office concerned about both big toenails.  He says they are painful in shoes. He presents for evaluation and treatment  General Appearance  Alert, conversant and in no acute stress.  Vascular  Dorsalis pedis and posterior tibial  pulses are weakly palpable  bilaterally.  Capillary return is within normal limits  bilaterally. Temperature is within normal limits  bilaterally.  Neurologic  Senn-Weinstein monofilament wire test within normal limits  bilaterally. Muscle power within normal limits bilaterally.  Nails Thick disfigured discolored nails with subungual debris  from hallux to fifth toes bilaterally. No evidence of bacterial infection or drainage bilaterally. Pincer hallux nails  B/L.  Orthopedic  No limitations of motion  feet .  No crepitus or effusions noted.  No bony pathology or digital deformities noted.  Skin  normotropic skin with no porokeratosis noted bilaterally.  No signs of infections or ulcers noted.     Pincer nails  Discussed condition with patient.  Told him his nail borders are growing into the nail grooves due to his pincer toanil deformity.  RTC 10 weeks  Ruffin Cotton DPM

## 2024-03-25 ENCOUNTER — Inpatient Hospital Stay: Attending: Adult Health | Admitting: Adult Health

## 2024-03-25 ENCOUNTER — Encounter: Payer: Self-pay | Admitting: Adult Health

## 2024-03-25 VITALS — BP 132/69 | HR 105 | Temp 98.1°F | Resp 16 | Wt 151.0 lb

## 2024-03-25 DIAGNOSIS — C770 Secondary and unspecified malignant neoplasm of lymph nodes of head, face and neck: Secondary | ICD-10-CM | POA: Insufficient documentation

## 2024-03-25 DIAGNOSIS — K449 Diaphragmatic hernia without obstruction or gangrene: Secondary | ICD-10-CM | POA: Diagnosis not present

## 2024-03-25 DIAGNOSIS — Z881 Allergy status to other antibiotic agents status: Secondary | ICD-10-CM | POA: Diagnosis not present

## 2024-03-25 DIAGNOSIS — R338 Other retention of urine: Secondary | ICD-10-CM | POA: Diagnosis not present

## 2024-03-25 DIAGNOSIS — Z923 Personal history of irradiation: Secondary | ICD-10-CM | POA: Insufficient documentation

## 2024-03-25 DIAGNOSIS — I2510 Atherosclerotic heart disease of native coronary artery without angina pectoris: Secondary | ICD-10-CM | POA: Diagnosis not present

## 2024-03-25 DIAGNOSIS — J948 Other specified pleural conditions: Secondary | ICD-10-CM | POA: Insufficient documentation

## 2024-03-25 DIAGNOSIS — Z85828 Personal history of other malignant neoplasm of skin: Secondary | ICD-10-CM | POA: Insufficient documentation

## 2024-03-25 DIAGNOSIS — Z91041 Radiographic dye allergy status: Secondary | ICD-10-CM | POA: Insufficient documentation

## 2024-03-25 DIAGNOSIS — Z882 Allergy status to sulfonamides status: Secondary | ICD-10-CM | POA: Diagnosis not present

## 2024-03-25 DIAGNOSIS — I719 Aortic aneurysm of unspecified site, without rupture: Secondary | ICD-10-CM | POA: Diagnosis not present

## 2024-03-25 DIAGNOSIS — L905 Scar conditions and fibrosis of skin: Secondary | ICD-10-CM | POA: Diagnosis not present

## 2024-03-25 DIAGNOSIS — Z880 Allergy status to penicillin: Secondary | ICD-10-CM | POA: Diagnosis not present

## 2024-03-25 DIAGNOSIS — Z79899 Other long term (current) drug therapy: Secondary | ICD-10-CM | POA: Insufficient documentation

## 2024-03-25 DIAGNOSIS — K625 Hemorrhage of anus and rectum: Secondary | ICD-10-CM | POA: Diagnosis not present

## 2024-03-25 DIAGNOSIS — I700 Atherosclerosis of aorta: Secondary | ICD-10-CM | POA: Diagnosis not present

## 2024-03-25 DIAGNOSIS — N401 Enlarged prostate with lower urinary tract symptoms: Secondary | ICD-10-CM | POA: Insufficient documentation

## 2024-03-25 DIAGNOSIS — I77810 Thoracic aortic ectasia: Secondary | ICD-10-CM | POA: Insufficient documentation

## 2024-03-25 DIAGNOSIS — Z8589 Personal history of malignant neoplasm of other organs and systems: Secondary | ICD-10-CM | POA: Diagnosis not present

## 2024-03-25 DIAGNOSIS — C001 Malignant neoplasm of external lower lip: Secondary | ICD-10-CM | POA: Insufficient documentation

## 2024-03-25 DIAGNOSIS — N3289 Other specified disorders of bladder: Secondary | ICD-10-CM | POA: Insufficient documentation

## 2024-03-25 NOTE — Progress Notes (Signed)
 "  CLINIC:  Survivorship  REASON FOR VISIT:  Routine follow-up for history of head & neck cancer.  BRIEF ONCOLOGIC HISTORY:  Oncology History  Cancer of lower lip, vermilion border  02/03/2022 Surgery   Pathology from Mohs to the left inferior lip lesion on 02/03/22 revealed invasive squamous cell carcinoma with PNI (largest involved nerve measuring 0.1 mm in diameter).    03/01/2022 Initial Diagnosis   Cancer of lower lip, vermilion border   03/01/2022 Cancer Staging   Staging form: Cutaneous Carcinoma of the Head and Neck, AJCC 8th Edition - Pathologic stage from 03/01/2022: Stage III (pT3, pN0, cM0) - Signed by Izell Domino, MD on 03/01/2022 Stage prefix: Initial diagnosis Extraosseous extension: Absent   03/14/2022 - 04/12/2022 Radiation Therapy    Plan Name: HN_LowLip_BO Site: Lip, Lower Technique: Electron Mode: Electron Dose Per Fraction: 2.5 Gy Prescribed Dose (Delivered / Prescribed): 50 Gy / 50 Gy Prescribed Fxs (Delivered / Prescribed): 20 / 20   05/27/2022 PET scan   IMPRESSION: 1. Enlarged and hypermetabolic right level 1A lymph node compatible with metastatic adenopathy. 2. There is mild, nonspecific diffuse increased uptake corresponding to the lower lip, which may reflect post treatment inflammation. 3. No signs of tracer avid metastatic disease to the chest, abdomen or pelvis. 4. Diffuse increased tracer uptake localizing to the nose is of uncertain significance. Correlate for any clinical signs or symptoms of inflammation or infection. 5. Moderate hiatal hernia with mild wall thickening involving the proximal stomach at the level of the GE junction with increased tracer uptake, SUV max 5.41. Correlate for any clinical signs or symptoms of gastritis. 6. Chronic sinus disease. 7. Prostate gland enlargement with evidence of chronic bladder outlet obstruction. 8.  Aortic Atherosclerosis (ICD10-I70.0).   06/27/2022 - 07/18/2022 Radiation Therapy    Plan  Name: HN_nodes Site: Neck Technique: IMRT Mode: Photon Dose Per Fraction: 3 Gy Prescribed Dose (Delivered / Prescribed): 48 Gy / 48 Gy Prescribed Fxs (Delivered / Prescribed): 16 / 16   10/2022 PET scan   IMPRESSION: 1. Response to therapy of right level 1A nodal metastasis. The lower lip soft tissue thickening has also decreased. 2. No hypermetabolic distant metastasis identified. 3. Similar nonspecific hypermetabolism about the nares. 4. Colonic hypermetabolism is most likely physiologic. Given focality within the hepatic flexure region, correlate with rectal bleeding or anemia. 5. Trace left pleural fluid or thickening, similar. 6. Similar mild ascending aortic dilatation at 4.1 cm. Recommend annual imaging followup by CTA or MRA. This recommendation follows 2010 ACCF/AHA/AATS/ACR/ASA/SCA/SCAI/SIR/STS/SVM Guidelines for the Diagnosis and Management of Patients with Thoracic Aortic Disease. Circulation. 2010; 121: Z733-z630. Aortic aneurysm NOS (ICD10-I71.9) 7. Incidental findings, including: Hiatal hernia. Coronary artery atherosclerosis. Aortic Atherosclerosis (ICD10-I70.0). Prostatomegaly with bladder distension suggests a component of outlet obstruction. Sinus disease.   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)   06/20/2022 Cancer Staging   Staging form: Cutaneous Carcinoma of the Head and Neck, AJCC 8th Edition - Clinical stage from 06/20/2022: Stage IV (rcTX, cN3b, cM0) - Signed by Izell Domino, MD on 06/20/2022 Stage prefix: Recurrence Extraosseous extension: Unknown   06/27/2022 - 07/18/2022 Radiation Therapy    Plan Name: HN_nodes Site: Neck Technique: IMRT Mode: Photon Dose Per Fraction: 3 Gy Prescribed Dose (Delivered / Prescribed): 48 Gy / 48 Gy Prescribed Fxs (Delivered / Prescribed): 16 / 16      INTERVAL HISTORY:  Discussed the use of AI scribe  software for clinical note transcription with the patient, who gave verbal consent to proceed.  History of Present Illness Zachary Padilla is a 89 year old male with a history of skin cancer and lymph node radiation who presents for follow-up on his cancer treatment and monitoring for side effects.  He has undergone multiple skin surgeries on his face, with a significant lesion that was leaking but is now managed. He continues exercises to manage fluid and scar tissue, though less regularly.  He experiences significant dryness in his mouth, especially at night, and drinks a lot of water to manage this symptom. He experiences strange pain in his ear, with swelling and a lack of sensation, describing it as feeling like a 'plastic ear.' An infection treated with antibiotics two months ago temporarily alleviated the symptoms. He also experiences sharp pain where nerves were cut during surgery, particularly around his lip.  The left side of his tongue feels rough, like a 'hot iron has been pressed against it.' He has dental issues, including problems with a couple of teeth, and anticipates losing two teeth. He has not seen a dentist since his radiation treatment. He is currently using a nasal spray prescribed for his ear issues. He is concerned about his B12 levels, as he has been taking supplements without having his levels checked.     ADDITIONAL REVIEW OF SYSTEMS:  Review of Systems  Constitutional:  Negative for appetite change, chills, fatigue, fever and unexpected weight change.  HENT:   Negative for hearing loss, lump/mass and trouble swallowing.   Eyes:  Negative for eye problems and icterus.  Respiratory:  Negative for chest tightness, cough and shortness of breath.   Cardiovascular:  Negative for chest pain, leg swelling and palpitations.  Gastrointestinal:  Negative for abdominal distention, abdominal pain, constipation, diarrhea, nausea and vomiting.  Endocrine: Negative for hot flashes.   Genitourinary:  Negative for difficulty urinating.   Musculoskeletal:  Negative for arthralgias.  Skin:  Negative for itching and rash.  Neurological:  Negative for dizziness, extremity weakness, headaches and numbness.  Hematological:  Negative for adenopathy. Does not bruise/bleed easily.  Psychiatric/Behavioral:  Negative for depression. The patient is not nervous/anxious.        CURRENT MEDICATIONS:  Current Outpatient Medications on File Prior to Visit  Medication Sig Dispense Refill   fluticasone (FLONASE) 50 MCG/ACT nasal spray Place 2 sprays into both nostrils daily.     cholecalciferol (VITAMIN D3) 25 MCG (1000 UNIT) tablet Take 1,000 Units by mouth daily.     fluorouracil (EFUDEX) 5 % cream Apply topically.     furosemide (LASIX) 20 MG tablet Take 20 mg by mouth daily. (Patient taking differently: Take 20 mg by mouth 3 (three) times a week. Monday,Wednesday,Friday)     levETIRAcetam  (KEPPRA ) 500 MG tablet TAKE 1/2 TABLET IN THE MORNING AND TAKE ONE TABLET IN THE EVENING 135 tablet 4   vitamin B-12 (CYANOCOBALAMIN) 50 MCG tablet Take 50 mcg by mouth daily.     No current facility-administered medications on file prior to visit.    ALLERGIES:  Allergies  Allergen Reactions   Ace Inhibitors Other (See Comments)    unknown   Aminoglycosides     Unknown reaction   Erythromycin Nausea And Vomiting   Iodinated Contrast Media     Cannot Have do to decreased kidney function.    Levofloxacin Other (See Comments)    dehydration   Penicillins Nausea And Vomiting   Sulfa Antibiotics     Unknown reaction  Ciprofloxacin Hives and Rash    hives     PHYSICAL EXAM:  Vitals:   03/25/24 1113  BP: 132/69  Pulse: (!) 105  Resp: 16  Temp: 98.1 F (36.7 C)  SpO2: 99%   Filed Weights   03/25/24 1113  Weight: 151 lb (68.5 kg)     General: Well-nourished, well-appearing male in no acute distress.  Accompanied/Unaccompanied today.  HEENT: Head is atraumatic and  normocephalic.  Pupils equal and reactive to light. Conjunctivae clear without exudate.  Sclerae anicteric. Oral mucosa is pink and moist without lesions.  Tongue pink, moist, and midline. Oropharynx is pink and moist, without lesions. Lymph: No preauricular, postauricular, cervical, supraclavicular, or infraclavicular lymphadenopathy noted on palpation.   Neck: No palpable masses. Skin on neck is supple, slight swelling underneath jaw line.  Cardiovascular: Normal rate and rhythm. Respiratory: Clear to auscultation bilaterally. Chest expansion symmetric without accessory muscle use; breathing non-labored.  GI: Abdomen soft and round. Non-tender, non-distended. Bowel sounds normoactive.  GU: Deferred.   Neuro: No focal deficits. Steady gait.   Psych: Normal mood and affect for situation. Extremities: No edema.  Skin: Warm and dry.    LABORATORY DATA:  None at this visit.  DIAGNOSTIC IMAGING:  None at this visit.    ASSESSMENT & PLAN:  Zachary Padilla is a pleasant 89 y.o. male with history of squamous cell carcinoma, diagnosed in 01/2022 then found to be in lymph nodes on PET scan in 05/2022;  treated with MOHS followed by radiation x 2 courses that he completed 07/2022.  Patient presents to survivorship clinic today for routine follow-up after finishing treatment.   1. 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     2. Nutritional status: Zachary Padilla reports that he is currently able to consume adequate nutrition with regular diet, he is not having any swallowing issues.  Weight is stable.  Encouraged to continue to consume adequate hydration and nutrition, as tolerated.    3.  At risk for neck lymphedema:  He is having some mild swelling and I recommended he resume his exercises, which he verbalized understanding of and tells me he plans to do so.  If he needs PT referral he will let me  know.     4.  At risk for hypothyroidism: The thyroid  gland is often affected after treatment for head & neck cancer.  Most recent TSH 6 months ago was normal.  Will order repeat testing with his nephrologist Dr. Oliver later this week.    5. At risk for tooth decay/dental concerns: After treatment with radiation for head & neck cancers, patients often experience xerostomia which increases their risk of dental caries. He has not seen the dentist since his radiation therapy completed early last year. I encouraged him to get back in with his dentist for follow up, cleaning, and evaluation.    6. Health maintenance and wellness promotion: Cancer patients who consume a diet rich in fruits and vegetables have better overall health and decreased risk of cancer recurrence. Zachary Padilla was encouraged to consume 5-7 servings of fruits and vegetables per day, as tolerated. Zachary Padilla was also encouraged to engage in moderate to vigorous exercise for 30 minutes per day most days of the week. I counseled him on avoiding tobacco and limiting ETOH consumption.    Dispo:  -Continue f/u with Dr. Llewellyn as scheduled -Return to cancer  center to see Survivorship NP in 1 year with TSH prior.    A total of 45 minutes was spent in the face-to-face care of this patient, with greater than 50% of that time spent in counseling and care-coordination.    Zachary Dalton Kendall, NP Survivorship Program [facility] [phone]  "

## 2024-03-30 LAB — LAB REPORT - SCANNED
Albumin, Urine POC: 15.1
Creatinine, POC: 93 mg/dL
EGFR: 46
Microalb Creat Ratio: 16
PTH: 55
TSH: 1.41 (ref 0.41–5.90)

## 2024-05-08 ENCOUNTER — Ambulatory Visit: Admitting: Podiatry

## 2024-06-12 ENCOUNTER — Ambulatory Visit: Admitting: Cardiovascular Disease

## 2024-06-13 ENCOUNTER — Telehealth: Payer: Self-pay | Admitting: Cardiovascular Disease

## 2024-06-13 NOTE — Telephone Encounter (Signed)
 Zachary Padilla is requesting a callback to schedule an appt with Dr. Court next week for both of her parents (patient and patient's spouse--see separate phone note for spouse). She states afternoons would be best.  Informed Zachary Padilla I did not see any openings and will forward to Dr. Ranee nurse to assist with scheduling appts when she returns to the office tomorrow. Zachary Padilla verbalized understanding.

## 2024-06-13 NOTE — Telephone Encounter (Signed)
 Pt daughter called. The pt and wife both need urgent appt from hospital visits with Dr berry on the same day. Pt prefers afternoons. Daughter stated a vm with the date and time can be left if she is unable to answer.

## 2025-01-27 ENCOUNTER — Ambulatory Visit: Admitting: Adult Health

## 2025-03-25 ENCOUNTER — Inpatient Hospital Stay: Admitting: Adult Health

## 2025-03-25 ENCOUNTER — Inpatient Hospital Stay: Attending: Adult Health
